# Patient Record
Sex: Male | Born: 1950
Health system: Southern US, Community
[De-identification: ages and names within clinical notes are randomized; demographics above are authoritative.]

## PROBLEM LIST (undated history)

## (undated) DIAGNOSIS — G459 Transient cerebral ischemic attack, unspecified: Secondary | ICD-10-CM

## (undated) DIAGNOSIS — N2889 Other specified disorders of kidney and ureter: Secondary | ICD-10-CM

## (undated) DIAGNOSIS — A0471 Enterocolitis due to Clostridium difficile, recurrent: Secondary | ICD-10-CM

## (undated) DIAGNOSIS — A0472 Enterocolitis due to Clostridium difficile, not specified as recurrent: Secondary | ICD-10-CM

## (undated) DIAGNOSIS — J449 Chronic obstructive pulmonary disease, unspecified: Secondary | ICD-10-CM

## (undated) DIAGNOSIS — I2510 Atherosclerotic heart disease of native coronary artery without angina pectoris: Secondary | ICD-10-CM

## (undated) DIAGNOSIS — I639 Cerebral infarction, unspecified: Secondary | ICD-10-CM

## (undated) DIAGNOSIS — I219 Acute myocardial infarction, unspecified: Secondary | ICD-10-CM

## (undated) DIAGNOSIS — R0989 Other specified symptoms and signs involving the circulatory and respiratory systems: Secondary | ICD-10-CM

## (undated) DIAGNOSIS — R338 Other retention of urine: Secondary | ICD-10-CM

## (undated) HISTORY — DX: Other specified symptoms and signs involving the circulatory and respiratory systems: R09.89

## (undated) HISTORY — PX: BACK SURGERY: SHX140

## (undated) HISTORY — PX: KNEE SURGERY: SHX244

---

## 1995-04-05 DIAGNOSIS — I639 Cerebral infarction, unspecified: Secondary | ICD-10-CM

## 1995-04-05 HISTORY — DX: Cerebral infarction, unspecified: I63.9

## 1998-08-20 ENCOUNTER — Encounter: Payer: Self-pay | Admitting: Neurosurgery

## 1998-08-24 ENCOUNTER — Encounter: Payer: Self-pay | Admitting: Neurosurgery

## 1998-08-24 ENCOUNTER — Ambulatory Visit (HOSPITAL_COMMUNITY): Admission: RE | Admit: 1998-08-24 | Discharge: 1998-08-24 | Payer: Self-pay | Admitting: Neurosurgery

## 1998-09-24 ENCOUNTER — Encounter: Admission: RE | Admit: 1998-09-24 | Discharge: 1998-10-09 | Payer: Self-pay | Admitting: Neurosurgery

## 1999-03-09 ENCOUNTER — Emergency Department (HOSPITAL_COMMUNITY): Admission: EM | Admit: 1999-03-09 | Discharge: 1999-03-09 | Payer: Self-pay | Admitting: Emergency Medicine

## 1999-03-09 ENCOUNTER — Encounter: Payer: Self-pay | Admitting: Emergency Medicine

## 1999-03-11 ENCOUNTER — Encounter: Payer: Self-pay | Admitting: Neurology

## 1999-03-11 ENCOUNTER — Ambulatory Visit (HOSPITAL_COMMUNITY): Admission: RE | Admit: 1999-03-11 | Discharge: 1999-03-11 | Payer: Self-pay | Admitting: Neurology

## 2005-07-08 ENCOUNTER — Emergency Department (HOSPITAL_COMMUNITY): Admission: EM | Admit: 2005-07-08 | Discharge: 2005-07-08 | Payer: Self-pay | Admitting: Emergency Medicine

## 2007-10-31 ENCOUNTER — Ambulatory Visit: Payer: Self-pay | Admitting: Cardiology

## 2007-11-05 ENCOUNTER — Ambulatory Visit: Payer: Self-pay

## 2010-08-17 NOTE — Assessment & Plan Note (Signed)
[facility]                            CARDIOLOGY OFFICE NOTE   Walter [name], Walter [name]                         MRN:          [id number]  DATE:10/31/2007                            DOB:          [birth year]    PRIMARY CARE PHYSICIAN:  Lorin Picket Long, PA   REASON FOR PRESENTATION:  Evaluate the patient with chest pain.   HISTORY OF PRESENT ILLNESS:  The patient is 60 year old.  He has no  documented coronary disease, although he had a CVA in 1997.  He reports  having had a catheterization 20 years ago.  He also described some  stress test.  He do not have any reports of these.  He saw Lorin Picket Long in  mid June and was describing some chest discomfort.  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  He described some substernal chest pressure.  It was not like  his previous reflux.  It was 6/10 in intensity.  He was short of breath  with it.  Did not radiate to his neck.  His left arm did tingling and  numb.  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  He got back to [facility] and called to  see Mr. Jacqulyn Bath.  In the office, he did not apparently have any acute EKG  changes.  He was given an aspirin and his symptoms on away.  They last  for about an hour and a half.  They was requested that he consent to  being admitted to the hospital, but the patient refused leaving the  office against medical advice.  He has had no further symptoms since  that time.  He is limited in his activities by hip pain.  He has not  pushed himself.  He has not been driving.  He has not been able to bring  on any of the symptoms that are described above.  He does not describe  any resting, shortness of breath, and has no PND or orthopnea.  He will  get dyspneic with some activity.  He has been a longtime cigarette  smoker and continues to do this.  He has some episodes of dizziness.  However, he says this has been unchanged since 1997.  He denies any  syncope.   PAST MEDICAL HISTORY:  1. Peptic ulcer disease.  2.  Hypertension.  3. Previous cerebrovascular accident and apparent TIAs as well.  4. Chronic low back pain.  5. Apparent COPD.   PAST SURGICAL HISTORY:  Back surgery and knee surgery.   ALLERGIES:  None.   MEDICATIONS:  1. Lisinopril HCT 20/25 daily.  2. Aspirin 325 mg.  3. Fish oil.   SOCIAL HISTORY:  The patient has been a one-pack-per-day smoker for 40  years.  He does drink alcohol.  He is a Naval architect.  He is married.  He has 2 children.   Family history is contributory for brother with early onset heart  disease.   REVIEW OF SYSTEMS:  As stated in the HPI.  Positive for dentures,  reflux, mild lower extremity swelling.  Negative for all other systems.  PHYSICAL EXAMINATION:  GENERAL:  The patient is in no distress.  VITAL SIGNS:  Blood pressure 132/70, heart rate 60 and regular, and  weight 190 pounds.  HEENT:  Eyes are unremarkable.  Pupils are equal, round, and reactive to  light.  Fundi not visualized.  Oral mucosa unremarkable.  NECK:  No jugular venous distention, 45 degrees.  Carotid upstroke brisk  and symmetrical.  No bruits.  No thyromegaly.  LYMPHATICS:  No cervical, axillary, or inguinal adenopathy.  LUNGS:  Clear to auscultation bilaterally.  BACK:  No costovertebral angle tenderness.  CHEST:  Unremarkable.  HEART:  PMI not displaced or sustained, S1 and S2 within normal limits,  no S3, no S4, no clicks, no rubs, no murmurs.  ABDOMEN:  Mildly obese, positive bowel sounds, normal in frequency and  pitch, no bruits, no rebound, no guarding or midline pulsatile mass.  No  hepatomegaly.  No splenomegaly.  SKIN:  No rashes, no nodules.  EXTREMITIES:  2+ pulses throughout, no edema, no cyanosis, no clubbing.  NEURO:  Oriented to person, place, time.  Cranial nerves II through XII  are grossly intact, motor grossly intact.   EKG:  Sinus rhythm, rate 64, axis within normal limits, intervals within  normal limits, no acute ST-T wave changes.   ASSESSMENT AND  PLAN:  1. Chest pain.  The patient's chest pain was very worrisome for an      episode of unstable angina.  He has significant cardiovascular risk      factors.  He refused hospital admission that day.  However, I do      think he will consent to an exercise Cardiolite.  He refuses to      have an adenosine Cardiolite if it came to that.  Therefore, this      would need to be an exercise perfusion study.  2. Hypertension.  Blood pressure is controlled.  He will continue the      medication as listed.  3. Tobacco.  I talked to him about Chantix (greater than 3 minutes).      He said he would not able to afford this.  He cannot otherwise      quit.  He has tried the other medications.  4. Dyslipidemia.  He has not been compliant with suggestions for      medications in the past.  He does have a low HDL.  If he would      consent, I would suggest generic fibric acid or Niaspan.  5. Followup.  We will see the patient back based on the results of the      above study.     Rollene Rotunda, MD, [facility]  Electronically Signed   JH/MedQ  DD: 10/31/2007  DT: 11/01/2007  Job #: [id number]   cc:   Lindaann Pascal, PA

## 2010-08-20 NOTE — Consult Note (Signed)
[place]  Patient:    Walter [name]                          MRN: [id number] Proc. Date: 03/09/99 Adm. Date:  [id number] Attending:  Lorre Nick                          Consultation Report  CHIEF COMPLAINT:  Unable to speak.  HISTORY OF PRESENT ILLNESS:  The patient is a 60 year old man who was brought by EMS from Dr. Garnette Gunner office in [place] for evaluation of inability to speak. Reportedly,  the patients wife had talked on the phone with the patient earlier  this morning, and she noticed he could not talk as normal.  The patients wife went to pick him up and take him to his doctors office, and subsequently the patient  was referred by Dr. Dewaine Conger to be sent to the emergency room by EMS.  The patients wife relates the patient had previous episodes of infarction characterized by inability to speak and weakness on the right side of his body.  He has been evaluated in the past by neurologist from [facility], including Dr. Noreene Filbert, Dr. Orlin Hilding, and Dr. Lesia Sago.  Previous evaluation with MRI in 1997 has shown evidence of left seventh semiovale subcortical infarction.  He had a repeat MRI and MRA in 1998 which have shown evidence of chronic infarctions, unremarkable MRA and unremarkable coagulation studies.  The patient currently is being treated with coated aspirin 81 mg once a day for secondary stroke prevention.  PAST MEDICAL HISTORY: 1. Strokes and TIAs. 2. Right lumbosacral radiculopathy, status post laminectomy. 3. Heart murmur.  MEDICATIONS:  Aspirin 81 mg once a day.  SOCIAL HISTORY:  The patient is a Naval architect.  He lives with his wife who states that recently he has been under a lot of stress at his work.  He smokes about one pack per day, denies alcohol drinking.  FAMILY HISTORY:  Mother had heart disease and hypertension.  Father deceased of  cancer.  Primary care physician is Dr. Colon Flattery from [place].  REVIEW OF SYSTEMS:  As per History of Present Illness.  PHYSICAL EXAMINATION:  VITAL SIGNS:  Blood pressure 130/79, pulse 64, respirations 16, temperature 98.4. Oxygen saturation 99%.  HEENT:  Head is normocephalic, atraumatic.  NECK:  Supple, no bruits.  LUNGS:  Clear bilaterally.  HEART:  Heart sounds regular rhythm, no murmurs.  ABDOMEN:  Soft, bowel sounds present.  No visceromegaly.  EXTREMITIES:  No cyanosis or edema.  NEUROLOGIC:   A well-developed man laying on the stretcher in no acute distress. He is awake and alert, able to follow commands.  He shows difficulties with speaking but is clearly functional. Pupils are equal, round and reactive. Extra-oculocephalic movement present.  Face is symmetric.  Tongue is midline. Palate elevates symmetrically.  Motor examination displays a right hemiparesis which is functional with positive Hoovers sign.  Reflexes symmetric throughout.  Plantars downgoing bilaterally.  Sensory examination intact to touch and pinprick.  NEURAL IMAGING:  I have personally reviewed a CT scan of the patients brain which shows two previous lacunar infarctions.  No acute ischemic changes, no hemorrhage, no hydrocephalus.  IMPRESSION:   Aphasia, weakness, right hemiparesis, functional.  PLAN AND RECOMMENDATIONS:  The diagnosis, condition, and further intervention were discussed at length with the patient and his wife at the bedside.  This patient  does not want any further neurological investigation at this time.  Will advise in regards to cerebrovascular disease to continue on his aspirin for stroke prevention.  Further psychological counselling versus psychiatric consultation should be pursued at this time.  Thank you for allowing me to participate in the care of this patient. DD:  03/09/99 TD:  03/09/99 Job: [id number]

## 2010-08-20 NOTE — Letter (Signed)
November 16, 2007    Tamala Bari  [facility] [employer]  [address] [place], [place] [address]   RE:  YAVUZ, KIRBY  MRN:  [id number]  /  DOB:  1950/07/09   Dear Ms. Roseanne Reno:   This letter regards Lonell Grandchild.  Mr. Stiefel was sent to my Cardiology  Clinic for evaluation of chest discomfort.  He had no prior cardiac  history.  I sent him for an exercise perfusion stress test to rule out  ischemic coronary artery disease.  He had a very poor exercise  tolerance, but achieved the target heart rate.  There was an ejection  fraction of 61%.  There was no evidence of ischemic heart disease or  high-grade obstructive coronary artery disease.   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  If you have further  questions, please contact my office at [phone].    Sincerely,      Rollene Rotunda, MD, [facility]  Electronically Signed    JH/MedQ  DD: 11/16/2007  DT: 11/17/2007  Job #: [id number]

## 2011-06-06 ENCOUNTER — Other Ambulatory Visit: Payer: Self-pay | Admitting: Family Medicine

## 2011-06-06 DIAGNOSIS — R0989 Other specified symptoms and signs involving the circulatory and respiratory systems: Secondary | ICD-10-CM

## 2011-06-09 ENCOUNTER — Ambulatory Visit (HOSPITAL_COMMUNITY)
Admission: RE | Admit: 2011-06-09 | Discharge: 2011-06-09 | Disposition: A | Discharge status: Home / Self Care | Payer: Medicare Other | Source: Ambulatory Visit | Attending: Family Medicine | Admitting: Family Medicine

## 2011-06-09 DIAGNOSIS — R0989 Other specified symptoms and signs involving the circulatory and respiratory systems: Secondary | ICD-10-CM | POA: Insufficient documentation

## 2012-02-17 ENCOUNTER — Emergency Department (HOSPITAL_COMMUNITY): Payer: Medicare Other

## 2012-02-17 ENCOUNTER — Inpatient Hospital Stay (HOSPITAL_COMMUNITY)
Admission: EM | Admit: 2012-02-17 | Discharge: 2012-02-19 | DRG: 066 | Disposition: A | Discharge status: Home / Self Care | Payer: Medicare Other | Attending: Family Medicine | Admitting: Family Medicine

## 2012-02-17 ENCOUNTER — Encounter (HOSPITAL_COMMUNITY): Payer: Self-pay | Admitting: *Deleted

## 2012-02-17 DIAGNOSIS — Z8249 Family history of ischemic heart disease and other diseases of the circulatory system: Secondary | ICD-10-CM

## 2012-02-17 DIAGNOSIS — E785 Hyperlipidemia, unspecified: Secondary | ICD-10-CM | POA: Diagnosis present

## 2012-02-17 DIAGNOSIS — I252 Old myocardial infarction: Secondary | ICD-10-CM

## 2012-02-17 DIAGNOSIS — Z23 Encounter for immunization: Secondary | ICD-10-CM

## 2012-02-17 DIAGNOSIS — R29898 Other symptoms and signs involving the musculoskeletal system: Secondary | ICD-10-CM | POA: Diagnosis present

## 2012-02-17 DIAGNOSIS — Z823 Family history of stroke: Secondary | ICD-10-CM

## 2012-02-17 DIAGNOSIS — Z91199 Patient's noncompliance with other medical treatment and regimen due to unspecified reason: Secondary | ICD-10-CM

## 2012-02-17 DIAGNOSIS — J449 Chronic obstructive pulmonary disease, unspecified: Secondary | ICD-10-CM | POA: Diagnosis present

## 2012-02-17 DIAGNOSIS — I2510 Atherosclerotic heart disease of native coronary artery without angina pectoris: Secondary | ICD-10-CM | POA: Diagnosis present

## 2012-02-17 DIAGNOSIS — J4489 Other specified chronic obstructive pulmonary disease: Secondary | ICD-10-CM | POA: Diagnosis present

## 2012-02-17 DIAGNOSIS — I69998 Other sequelae following unspecified cerebrovascular disease: Secondary | ICD-10-CM

## 2012-02-17 DIAGNOSIS — Z7982 Long term (current) use of aspirin: Secondary | ICD-10-CM

## 2012-02-17 DIAGNOSIS — I6350 Cerebral infarction due to unspecified occlusion or stenosis of unspecified cerebral artery: Principal | ICD-10-CM | POA: Diagnosis present

## 2012-02-17 DIAGNOSIS — Z79899 Other long term (current) drug therapy: Secondary | ICD-10-CM

## 2012-02-17 DIAGNOSIS — Z720 Tobacco use: Secondary | ICD-10-CM | POA: Diagnosis present

## 2012-02-17 DIAGNOSIS — F17200 Nicotine dependence, unspecified, uncomplicated: Secondary | ICD-10-CM | POA: Diagnosis present

## 2012-02-17 DIAGNOSIS — I639 Cerebral infarction, unspecified: Secondary | ICD-10-CM

## 2012-02-17 DIAGNOSIS — Z8673 Personal history of transient ischemic attack (TIA), and cerebral infarction without residual deficits: Secondary | ICD-10-CM | POA: Diagnosis present

## 2012-02-17 DIAGNOSIS — Z9119 Patient's noncompliance with other medical treatment and regimen: Secondary | ICD-10-CM

## 2012-02-17 HISTORY — DX: Atherosclerotic heart disease of native coronary artery without angina pectoris: I25.10

## 2012-02-17 HISTORY — DX: Acute myocardial infarction, unspecified: I21.9

## 2012-02-17 HISTORY — DX: Cerebral infarction, unspecified: I63.9

## 2012-02-17 HISTORY — DX: Transient cerebral ischemic attack, unspecified: G45.9

## 2012-02-17 HISTORY — DX: Chronic obstructive pulmonary disease, unspecified: J44.9

## 2012-02-17 LAB — PROTIME-INR: Prothrombin Time: 12.8 seconds (ref 11.6–15.2)

## 2012-02-17 LAB — URINALYSIS, ROUTINE W REFLEX MICROSCOPIC
Leukocytes, UA: NEGATIVE
Nitrite: NEGATIVE
Protein, ur: NEGATIVE mg/dL
Urobilinogen, UA: 0.2 mg/dL (ref 0.0–1.0)

## 2012-02-17 LAB — CBC WITH DIFFERENTIAL/PLATELET
Basophils Absolute: 0 10*3/uL (ref 0.0–0.1)
Eosinophils Relative: 3 % (ref 0–5)
Lymphocytes Relative: 37 % (ref 12–46)
MCV: 88.3 fL (ref 78.0–100.0)
Platelets: 238 10*3/uL (ref 150–400)
RDW: 13.6 % (ref 11.5–15.5)
WBC: 9.2 10*3/uL (ref 4.0–10.5)

## 2012-02-17 LAB — BASIC METABOLIC PANEL
CO2: 23 mEq/L (ref 19–32)
Calcium: 9.4 mg/dL (ref 8.4–10.5)
GFR calc non Af Amer: >90 mL/min (ref >90)
Sodium: 136 mEq/L (ref 135–145)

## 2012-02-17 NOTE — ED Notes (Signed)
Dizziness,slurred speech, headache, Hx of stroke.  Onset yesterday

## 2012-02-17 NOTE — ED Provider Notes (Signed)
History     CSN: [id number]  Arrival date & time 02/17/12  2018   First MD Initiated Contact with Patient 02/17/12 2032      No chief complaint on file.   (Consider location/radiation/quality/duration/timing/severity/associated sxs/prior treatment) HPI Comments: REINHARD SCHACK presents with a now 2 day history of intermittent episodes of dizziness which he describes as a spinning sensation which is triggered by standing,  Describing having 2 distinct episodes lasting about 5 minutes yesterday and another today.  His wife noticed him to have slurred speech since he woke this am. He describes a mild generalized headache and also feels numb in his left face and arm.  He does have a history of cva in 1998 which also involved slurred speech but did not leave him with any residual symptoms.  He has taken an aspirin 325 mg prior to arrival.  He is supposed to be on medications for blood pressure per wife, but has been noncompliant.  The history is provided by the patient and the spouse.    Past Medical History  Diagnosis Date  . Stroke 1997  . COPD (chronic obstructive pulmonary disease)   . Coronary artery disease   . Myocardial infarction     incidental  . Brain TIA     recurrent     Past Surgical History  Procedure Date  . Back surgery   . Knee surgery     Family History  Problem Relation Age of Onset  . Cancer Father 38    stomach  . Stroke Neg Hx   . Coronary artery disease Mother 50  . Coronary artery disease Brother 37    AMI deceased    History  Substance Use Topics  . Smoking status: Current Every Day Smoker -- 1.3 packs/day for 17 years  . Smokeless tobacco: Current User    Types: Chew  . Alcohol Use: No      Review of Systems  Constitutional: Negative for fever.  HENT: Negative for congestion, sore throat and neck pain.   Eyes: Negative.   Respiratory: Negative for chest tightness and shortness of breath.   Cardiovascular: Negative for chest pain.    Gastrointestinal: Negative for nausea and abdominal pain.  Genitourinary: Negative.   Musculoskeletal: Negative for joint swelling and arthralgias.  Skin: Negative.  Negative for rash and wound.  Neurological: Positive for dizziness, speech difficulty, numbness and headaches. Negative for weakness and light-headedness.  Hematological: Negative.   Psychiatric/Behavioral: Negative.     Allergies  Review of patient's allergies indicates no known allergies.  Home Medications   Current Outpatient Rx  Name  Route  Sig  Dispense  Refill  . ASPIRIN 325 MG PO TABS   Oral   Take 325 mg by mouth once as needed. For pain           BP 166/86  Pulse 82  Temp 97.7 F (36.5 C) (Oral)  Resp 14  Ht 6\' 1"  (1.854 m)  Wt 180 lb (81.647 kg)  BMI 23.75 kg/m2  SpO2 95%  Physical Exam  Nursing note and vitals reviewed. Constitutional: He is oriented to person, place, and time. He appears well-developed and well-nourished.       Uncomfortable appearing  HENT:  Head: Normocephalic and atraumatic.  Right Ear: External ear normal.  Left Ear: External ear normal.  Mouth/Throat: Oropharynx is clear and moist.  Eyes: EOM are normal. Pupils are equal, round, and reactive to light.  Neck: Normal range of motion. Neck supple.  Cardiovascular: Normal rate and normal heart sounds.   Pulmonary/Chest: Effort normal.  Abdominal: Soft. There is no tenderness.  Musculoskeletal: Normal range of motion.  Lymphadenopathy:    He has no cervical adenopathy.  Neurological: He is alert and oriented to person, place, and time. A cranial nerve deficit and sensory deficit is present. Gait normal. GCS eye subscore is 4. GCS verbal subscore is 5. GCS motor subscore is 6.       Numbness to light touch left face including forehead.  4/5 left grip, 5/5 right grip strength.  Pt became dizzy with attempts to sit and attempt pronator drift.  Difficulty with heel/shin test with left leg.  Slight dysarthria noted.  Skin:  Skin is warm and dry. No rash noted.  Psychiatric: He has a normal mood and affect. His speech is normal and behavior is normal. Thought content normal. Cognition and memory are normal.    ED Course  Procedures (including critical care time)  Labs Reviewed  BASIC METABOLIC PANEL - Abnormal; Notable for the following:    Glucose, Bld 100 (*)     All other components within normal limits  CBC WITH DIFFERENTIAL  URINALYSIS, ROUTINE W REFLEX MICROSCOPIC  PROTIME-INR   Ct Head Wo Contrast  02/17/2012  *RADIOLOGY REPORT*  Clinical Data: Slurred speech, weakness, dizziness.  CT HEAD WITHOUT CONTRAST  Technique:  Contiguous axial images were obtained from the base of the skull through the vertex without contrast.  Comparison: 11/19/2009  Findings: Mild prominence of the sulci, cisterns, and ventricles, in keeping with volume loss. There are subcortical and periventricular white matter hypodensities, a nonspecific finding most often seen with chronic microangiopathic changes.  There is no evidence for acute hemorrhage, overt hydrocephalus, mass lesion, or abnormal extra-axial fluid collection.  No definite CT evidence for acute cortical based (large artery) infarction. Multiple bilateral basal ganglia lacunar infarctions are favored remote however, progressed from 2011.  Mild ethmoid air cell opacification.  Otherwise, the visualized paranasal sinuses and mastoid air cells are predominately clear.  IMPRESSION: White matter changes and bilateral remote appearing basal ganglia lacunar infarctions, progressed from 2011.  Given the clinical presentation and evidence of multiple prior infarcts, MRI follow-up is reasonable if concern for acute ischemia persists.  Mild ethmoid air cell opacification.  Correlate clinically for early/mild sinusitis.   Original Report Authenticated By: Jearld Lesch, M.D.      1. CVA (cerebral infarction)       MDM  Reviewed labs and Ct scan with patient.  Discussed with  Dr Estell Harpin who agreed with need for admission.  Spoke with Dr. Orvan Falconer who will see pt in ed.        Burgess Amor, PA 02/18/12 0011  Burgess Amor, PA 02/18/12 1610

## 2012-02-17 NOTE — ED Notes (Signed)
EDPA in with pt 

## 2012-02-18 ENCOUNTER — Inpatient Hospital Stay (HOSPITAL_COMMUNITY): Payer: Medicare Other

## 2012-02-18 ENCOUNTER — Encounter (HOSPITAL_COMMUNITY): Payer: Self-pay | Admitting: Internal Medicine

## 2012-02-18 DIAGNOSIS — F17200 Nicotine dependence, unspecified, uncomplicated: Secondary | ICD-10-CM

## 2012-02-18 DIAGNOSIS — I517 Cardiomegaly: Secondary | ICD-10-CM

## 2012-02-18 DIAGNOSIS — Z9119 Patient's noncompliance with other medical treatment and regimen: Secondary | ICD-10-CM

## 2012-02-18 DIAGNOSIS — Z91199 Patient's noncompliance with other medical treatment and regimen due to unspecified reason: Secondary | ICD-10-CM

## 2012-02-18 DIAGNOSIS — Z720 Tobacco use: Secondary | ICD-10-CM | POA: Diagnosis present

## 2012-02-18 DIAGNOSIS — I6350 Cerebral infarction due to unspecified occlusion or stenosis of unspecified cerebral artery: Principal | ICD-10-CM

## 2012-02-18 DIAGNOSIS — Z8673 Personal history of transient ischemic attack (TIA), and cerebral infarction without residual deficits: Secondary | ICD-10-CM | POA: Diagnosis present

## 2012-02-18 DIAGNOSIS — J449 Chronic obstructive pulmonary disease, unspecified: Secondary | ICD-10-CM

## 2012-02-18 LAB — CBC
MCH: 29.6 pg (ref 26.0–34.0)
Platelets: 207 10*3/uL (ref 150–400)
RBC: 5.17 MIL/uL (ref 4.22–5.81)
RDW: 13.6 % (ref 11.5–15.5)
WBC: 7.7 10*3/uL (ref 4.0–10.5)

## 2012-02-18 LAB — CREATININE, SERUM: Creatinine, Ser: 0.79 mg/dL (ref 0.50–1.35)

## 2012-02-18 LAB — LIPID PANEL
Cholesterol: 204 mg/dL — ABNORMAL HIGH (ref 0–200)
HDL: 27 mg/dL — ABNORMAL LOW (ref >39)
Total CHOL/HDL Ratio: 7.6 RATIO
VLDL: 29 mg/dL (ref 0–40)

## 2012-02-18 MED ORDER — ONDANSETRON HCL 4 MG/2ML IJ SOLN: 4.0000 mg | Freq: Four times a day (QID) | INTRAMUSCULAR | Status: DC | PRN

## 2012-02-18 MED ORDER — ASPIRIN 300 MG RE SUPP
300.0000 mg | Freq: Every day | RECTAL | Status: DC
  Filled 2012-02-18 (×2): qty 1

## 2012-02-18 MED ORDER — ASPIRIN 325 MG PO TABS
325.0000 mg | ORAL_TABLET | Freq: Every day | ORAL | Status: DC
  Administered 2012-02-18 – 2012-02-19 (×2): 325 mg via ORAL
  Filled 2012-02-18 (×2): qty 1

## 2012-02-18 MED ORDER — SENNOSIDES-DOCUSATE SODIUM 8.6-50 MG PO TABS: 1.0000 | ORAL_TABLET | Freq: Every evening | ORAL | Status: DC | PRN

## 2012-02-18 MED ORDER — FLEET ENEMA 7-19 GM/118ML RE ENEM: 1.0000 | ENEMA | Freq: Every day | RECTAL | Status: DC | PRN

## 2012-02-18 MED ORDER — SODIUM CHLORIDE 0.9 % IV SOLN
INTRAVENOUS | Status: DC
  Administered 2012-02-18 (×2): via INTRAVENOUS

## 2012-02-18 MED ORDER — BISACODYL 5 MG PO TBEC: 5.0000 mg | DELAYED_RELEASE_TABLET | Freq: Every day | ORAL | Status: DC | PRN

## 2012-02-18 MED ORDER — SIMVASTATIN 20 MG PO TABS
20.0000 mg | ORAL_TABLET | Freq: Every day | ORAL | Status: DC
  Administered 2012-02-18 (×2): 20 mg via ORAL
  Filled 2012-02-18 (×4): qty 1

## 2012-02-18 MED ORDER — ENOXAPARIN SODIUM 40 MG/0.4ML ~~LOC~~ SOLN
40.0000 mg | SUBCUTANEOUS | Status: DC
  Administered 2012-02-18 – 2012-02-19 (×2): 40 mg via SUBCUTANEOUS
  Filled 2012-02-18 (×2): qty 0.4

## 2012-02-18 MED ORDER — ACETAMINOPHEN 325 MG PO TABS: 650.0000 mg | ORAL_TABLET | ORAL | Status: DC | PRN

## 2012-02-18 MED ORDER — NICOTINE 21 MG/24HR TD PT24: 21.0000 mg | MEDICATED_PATCH | Freq: Every day | TRANSDERMAL | Status: DC | PRN

## 2012-02-18 MED ORDER — PNEUMOCOCCAL VAC POLYVALENT 25 MCG/0.5ML IJ INJ
0.5000 mL | INJECTION | INTRAMUSCULAR | Status: AC
  Filled 2012-02-18: qty 0.5

## 2012-02-18 NOTE — Progress Notes (Signed)
*  PRELIMINARY RESULTS* Echocardiogram 2D Echocardiogram has been performed.  Walter [name] 02/18/2012, 3:57 PM

## 2012-02-18 NOTE — Progress Notes (Signed)
11:07 AM I agree with HPI/GPe and A/P per Dr. Orvan Falconer       Patient states he had 2 episodes of dizziness, one on 11/14 at night, the other 11/15 at 13:00.  Wife reports was unsteady getting out of the car and was noted to have some slurred speech by his wife at that time   HEENT eomi, no pallor/ict CHEST clear, no added sound.  NO Tvr/f CARDIAC s1 s2 no m/r/g ABDOMEN soft nt/nd Neurologic: Mental status: Alert, oriented, thought content appropriate, alertness: alert, orientation: time, date, person, affect: normal, thought content exhibits logical connections Cranial nerves: II: visual acuity normal bilaterally, II: pupils equal, round, reactive to light and accommodation, V: mastication normal, VII: upper facial muscle function normal bilaterally, VII: lower facial muscle function normal bilaterally, VIII: hearing normal, XI: trapezius strength normal bilaterally, XI: sternocleidomastoid strength normal bilaterally, XI: neck flexion strength normal, XII: tongue strength normal  Sensory: temperature sense present generalized bilaterally, proprioceptive sense present generalized bilaterally Motor: grossly normal Reflexes: 2+ and symmetric Coordination: normal Gait: Normal SKIN/MUSCULAR   Patient Active Problem List  Diagnosis  . CVA (cerebral infarction)  . Tobacco abuse  . Non-compliance  . COPD (chronic obstructive pulmonary disease)   A/p  Agree with Dr. Blair Dolphin plan Add Statin given LDL 148 Follow A1c given slightly elevated CBG's Allow permissive HTN for 24 hours Cont ASa for now  Pleas Koch, MD [facility] [phone]  Dr. Roseanne Reno of [facility] made aware of patient-will see when Imaging and work-up completed

## 2012-02-18 NOTE — Progress Notes (Signed)
[facility] follow-up progress note (same day)  MRI shows acute CVA--Acute small non hemorrhagic infarct extends from the mid  left corona radiata into the posterior limb of the left internal  capsule.  Remote infarcts involving the corona radiata and basal ganglia  bilaterally, right cerebellum, right paracentral pons and right  thalamus.   Schedule patient and wife-neurology to followup. Patient will need physical therapy occupational therapy to see him for clearance home.  Pleas Koch, MD [facility] [phone]

## 2012-02-18 NOTE — Progress Notes (Signed)
*  PRELIMINARY RESULTS* Vascular Ultrasound Carotid Duplex (Doppler) has been completed.  Preliminary findings: Bilateral:  No evidence of hemodynamically significant internal carotid artery stenosis.   Vertebral artery flow is antegrade.      Farrel Demark, RDMS, RVT 02/18/2012, 11:25 AM

## 2012-02-18 NOTE — Progress Notes (Signed)
PT Cancellation Note  Patient Details Name: Walter [name] MRN: [id number] DOB: September 21, 1950   Cancelled Treatment:    Reason Eval/Treat Not Completed: Patient not medically ready.  PT order received - to start 02/19/12 (tomorrow).  Will return tomorrow for PT evaluation.   Vena Austria 02/18/2012, 2:24 PM [phone]

## 2012-02-18 NOTE — ED Notes (Signed)
MD at bedside. 

## 2012-02-18 NOTE — Consult Note (Signed)
Referring Physician: Dr. Mahala Menghini    Chief Complaint: Dizziness and slurred speech.  HPI: Walter [name] is an 61 y.o. male a history of previous stroke and multiple TIAs, coronary artery disease, hyperlipidemia and COPD, who was brought to the hospital following onset of episode of dizziness as well as onset of slurred speech. His wife also noted his walking had changed. CT scan of his head was unremarkable except for old small vessel ischemic changes. MRI of his brain today showed acute small non-hemorrhagic infarction extending from the mid left corona radiata to the posterior limb of left internal capsule. Patient has not been on antiplatelet therapy. NIH stroke score at this point is 2. He was last seen normal at about 1:30 yesterday afternoon.  LSN: 1:30 PM on 02/17/2012 tPA Given: No: Mild deficits only. MRankin: 0   Past Medical History  Diagnosis Date  . Stroke 1997  . COPD (chronic obstructive pulmonary disease)   . Coronary artery disease   . Myocardial infarction     incidental  . Brain TIA     recurrent     Family History  Problem Relation Age of Onset  . Cancer Father 89    stomach  . Stroke Neg Hx   . Coronary artery disease Mother 35  . Coronary artery disease Brother 47    AMI deceased     Medications:  Prior to Admission:  Prescriptions prior to admission  Medication Sig Dispense Refill  . aspirin 325 MG tablet Take 325 mg by mouth once as needed. For pain       Scheduled:   . aspirin  300 mg Rectal Daily   Or  . aspirin  325 mg Oral Daily  . enoxaparin  40 mg Subcutaneous Q24H  . pneumococcal 23 valent vaccine  0.5 mL Intramuscular Tomorrow-1000  . simvastatin  20 mg Oral q1800   ZOX:[id number], bisacodyl, nicotine, ondansetron (ZOFRAN) IV, senna-docusate, sodium phosphate   Physical Examination: Blood pressure 132/85, pulse 83, temperature 98.2 F (36.8 C), temperature source Oral, resp. rate 18, height 6\' 1"  (1.854 m), weight 72.7 kg (160 lb  4.4 oz), SpO2 99.00%.  Neurologic Examination: Mental Status: Alert, oriented, thought content appropriate.  Speech was slow, but fluent without evidence of aphasia. Able to follow commands without difficulty. Cranial Nerves: II-Visual fields were normal. III/IV/VI-Pupils were equal and reacted. Extraocular movements were full and conjugate.    V/VII-no facial numbness and no facial weakness. VIII-normal. X-normal speech and symmetrical palatal movement. XII-midline tongue extension Motor: Slight left upper extremity pronator drift and mild left hip flexor weakness; motor exam is otherwise normal. Sensory: Normal throughout. Deep Tendon Reflexes: 2+ and symmetric. Plantars: Mute bilaterally Cerebellar: Normal finger-to-nose testing. Carotid auscultation: Normal   Ct Head Wo Contrast  02/17/2012  *RADIOLOGY REPORT*  Clinical Data: Slurred speech, weakness, dizziness.  CT HEAD WITHOUT CONTRAST  Technique:  Contiguous axial images were obtained from the base of the skull through the vertex without contrast.  Comparison: 11/19/2009  Findings: Mild prominence of the sulci, cisterns, and ventricles, in keeping with volume loss. There are subcortical and periventricular white matter hypodensities, a nonspecific finding most often seen with chronic microangiopathic changes.  There is no evidence for acute hemorrhage, overt hydrocephalus, mass lesion, or abnormal extra-axial fluid collection.  No definite CT evidence for acute cortical based (large artery) infarction. Multiple bilateral basal ganglia lacunar infarctions are favored remote however, progressed from 2011.  Mild ethmoid air cell opacification.  Otherwise, the visualized paranasal sinuses and  mastoid air cells are predominately clear.  IMPRESSION: White matter changes and bilateral remote appearing basal ganglia lacunar infarctions, progressed from 2011.  Given the clinical presentation and evidence of multiple prior infarcts, MRI follow-up  is reasonable if concern for acute ischemia persists.  Mild ethmoid air cell opacification.  Correlate clinically for early/mild sinusitis.   Original Report Authenticated By: Jearld Lesch, M.D.    Mr Brain Wo Contrast  02/18/2012  *RADIOLOGY REPORT*  Clinical Data:  Slurred speech.  Weakness.  MRI BRAIN WITHOUT CONTRAST MRA HEAD WITHOUT CONTRAST  Technique: Multiplanar, multiecho pulse sequences of the brain and surrounding structures were obtained according to standard protocol without intravenous contrast.  Angiographic images of the head were obtained using MRA technique without contrast.  Comparison: 02/17/2012 CT.  No comparison MR.  MRI HEAD  Findings:  Acute small non hemorrhagic infarct extends from the mid left corona radiata into the posterior limb of the left internal capsule.  Remote infarcts involving the corona radiata and basal ganglia bilaterally, right cerebellum, right paracentral pons and right thalamus.  Marked small vessel disease type changes.  Minimal amount of blood breakdown products associated with remote left caudate infarct otherwise no evidence of intracranial hemorrhage.  No intracranial mass lesion detected on this unenhanced exam.  Global atrophy without hydrocephalus.  Mild exophthalmos.  IMPRESSION: Acute small non hemorrhagic infarct extends from the mid left corona radiata into the posterior limb of the left internal capsule.  Remote infarcts and small vessel disease type changes with atrophy as detailed above  MRA HEAD  Findings: Anterior circulation without large vessel significant stenosis or occlusion.  Mild to moderate narrowing distal A1 segment of the left anterior cerebral artery.  Middle cerebral artery branch vessel irregularity bilaterally.  Left vertebral artery is dominant.  Moderate narrowing of the right vertebral artery after the takeoff of the right PICA.  Mild narrowing of the left vertebral artery after the takeoff of the left PICA.  Mild irregularity  and slight narrowing involving portions of the basilar artery without high-grade stenosis.  Nonvisualization AICAs.  Mild irregularity of the superior cerebellar arteries and posterior cerebral arteries bilaterally.  No aneurysm or vessel malformation.  IMPRESSION: Intracranial atherosclerotic type changes as detailed above.  This has been made a PRA call report utilizing dashboard call feature.   Original Report Authenticated By: Lacy Duverney, M.D.    Mr Mra Head/brain Wo Cm  02/18/2012  *RADIOLOGY REPORT*  Clinical Data:  Slurred speech.  Weakness.  MRI BRAIN WITHOUT CONTRAST MRA HEAD WITHOUT CONTRAST  Technique: Multiplanar, multiecho pulse sequences of the brain and surrounding structures were obtained according to standard protocol without intravenous contrast.  Angiographic images of the head were obtained using MRA technique without contrast.  Comparison: 02/17/2012 CT.  No comparison MR.  MRI HEAD  Findings:  Acute small non hemorrhagic infarct extends from the mid left corona radiata into the posterior limb of the left internal capsule.  Remote infarcts involving the corona radiata and basal ganglia bilaterally, right cerebellum, right paracentral pons and right thalamus.  Marked small vessel disease type changes.  Minimal amount of blood breakdown products associated with remote left caudate infarct otherwise no evidence of intracranial hemorrhage.  No intracranial mass lesion detected on this unenhanced exam.  Global atrophy without hydrocephalus.  Mild exophthalmos.  IMPRESSION: Acute small non hemorrhagic infarct extends from the mid left corona radiata into the posterior limb of the left internal capsule.  Remote infarcts and small vessel disease type changes with  atrophy as detailed above  MRA HEAD  Findings: Anterior circulation without large vessel significant stenosis or occlusion.  Mild to moderate narrowing distal A1 segment of the left anterior cerebral artery.  Middle cerebral artery branch  vessel irregularity bilaterally.  Left vertebral artery is dominant.  Moderate narrowing of the right vertebral artery after the takeoff of the right PICA.  Mild narrowing of the left vertebral artery after the takeoff of the left PICA.  Mild irregularity and slight narrowing involving portions of the basilar artery without high-grade stenosis.  Nonvisualization AICAs.  Mild irregularity of the superior cerebellar arteries and posterior cerebral arteries bilaterally.  No aneurysm or vessel malformation.  IMPRESSION: Intracranial atherosclerotic type changes as detailed above.  This has been made a PRA call report utilizing dashboard call feature.   Original Report Authenticated By: Lacy Duverney, M.D.     Assessment: 61 y.o. male presenting with acute left basal ganglia, internal capsular and corona radiata ischemic infarction. Mild left side weakness is probably secondary to his previous right pontine infarction.  Stroke Risk Factors - hyperlipidemia  Plan: 1. HgbA1c, fasting lipid panel 3. PT consult, OT consult, Speech consult 3 Echocardiogram 4. Carotid dopplers 5. Prophylactic therapy-Antiplatelet med: Aspirin 325 mg per day 6. Risk factor modification 7. Telemetry monitoring  C.R. Roseanne Reno, MD [facility] [phone]  02/18/2012, 4:53 PM

## 2012-02-18 NOTE — ED Provider Notes (Signed)
Medical screening examination/treatment/procedure(s) were performed by non-physician practitioner and as supervising physician I was immediately available for consultation/collaboration.   Lataria Courser L Jenna Routzahn, MD 02/18/12 2207 

## 2012-02-18 NOTE — ED Notes (Signed)
No change in pt status, has been sleeping. Now awake and aware of impending transport to [facility].

## 2012-02-18 NOTE — ED Notes (Signed)
Sleeping, waiting for bed assignment 

## 2012-02-18 NOTE — Progress Notes (Signed)
*  PRELIMINARY RESULTS* Echocardiogram 2D Echocardiogram has been performed.  Walter [name] 02/18/2012, 3:57 PM 

## 2012-02-18 NOTE — H&P (Signed)
[facility] History and Physical  Walter [name]  ZOX:[id number]  DOB: 1950-08-09   DOA: 02/18/2012   PCP:   Rudi Heap, MD   Chief Complaint:  Dizziness and dysarthria for 2 days  HPI: Walter [name] is an 61 y.o. male.   Middle-aged Caucasian gentleman ongoing tobacco smoker, remote history of stroke without residual deficit, recurrent episodes of TIA last episode about one year ago, noncompliance with aspirin, and in fact takes no at all, reports he was in his baseline state of health until last night when he had episodes of dizziness which had worn off by morning. He felt fine on to later in the day he started to notice he was having difficulty and he thinks his left arm felt strange and his wife says he wasn't walking properly. He was brought to the emergency room and CT scan of his brain was abnormal and the hospitalist service called to assist with management.  He denies fever or headache, but when the problem started yesterday he took one aspirin.  He gives a history of having been told that he had an abnormal EKG which shows a remote MI, but has never had a symptomatic MI.  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  Rewiew of Systems:   All systems negative except as marked bold or noted in the HPI;  Constitutional: Negative for malaise, fever and chills. ;  Eyes: Negative for eye pain, redness and discharge. ;  ENMT: Negative for ear pain, hoarseness, nasal congestion, sinus pressure and sore throat. ;  Cardiovascular: Negative for chest pain, palpitations, diaphoresis, dyspnea and peripheral edema. ;  Respiratory: Negative for cough, hemoptysis, wheezing and stridor. ;  Gastrointestinal: Negative for nausea, vomiting, diarrhea, constipation, abdominal pain, melena, blood in stool, hematemesis, jaundice and rectal bleeding. unusual weight loss..   Genitourinary: Negative for frequency, dysuria, incontinence,flank pain and  hematuria; Musculoskeletal: Negative for back pain and neck pain. Negative for swelling and trauma.;  Skin: . Negative for pruritus, rash, abrasions, bruising and skin lesion.; ulcerations Neuro: Negative for headache, lightheadedness and neck stiffness. Negative for weakness, altered level of consciousness , altered mental status, extremity weakness, burning feet, involuntary movement, seizure and syncope.  Psych: negative for anxiety, depression, insomnia, tearfulness, panic attacks, hallucinations, paranoia, suicidal or homicidal ideation    Past Medical History  Diagnosis Date  . Stroke 1997  . COPD (chronic obstructive pulmonary disease)   . Coronary artery disease   . Myocardial infarction     incidental  . Brain TIA     recurrent     Past Surgical History  Procedure Date  . Back surgery   . Knee surgery     Medications:  HOME MEDS: Prior to Admission medications   Medication Sig Start Date End Date Taking? Authorizing Provider  aspirin 325 MG tablet Take 325 mg by mouth once as needed. For pain   Yes Historical Provider, MD     Allergies:  No Known Allergies  Social History:   reports that he has been smoking.  His smokeless tobacco use includes Chew. He reports that he does not drink alcohol or use illicit drugs.  Family History: Family History  Problem Relation Age of Onset  . Cancer Father 12    stomach  . Stroke Neg Hx   . Coronary artery disease Mother 8  . Coronary artery disease Brother 38    AMI deceased  Physical Exam: Filed Vitals:   02/17/12 2027 02/17/12 2332  BP: 166/86   Pulse: 82   Temp: 98.1 F (36.7 C) 97.7 F (36.5 C)  TempSrc: Oral   Resp: 14   Height: 6\' 1"  (1.854 m)   Weight: 81.647 kg (180 lb)   SpO2: 95%    Blood pressure 166/86, pulse 82, temperature 97.7 F (36.5 C), temperature source Oral, resp. rate 14, height 6\' 1"  (1.854 m), weight 81.647 kg (180 lb), SpO2 95.00%.  GEN:  Pleasant Caucasian gentleman lying in  the stretcher in no acute distress, but forgot to understand him because of his dysarthria ; cooperative with exam PSYCH:  alert and oriented x4; does not appear anxious or depressed; affect is appropriate. HEENT: Mucous membranes pink, dry, and anicteric; PERRLA; EOM intact; no cervical lymphadenopathy nor thyromegaly or carotid bruit; no JVD; Breasts:: Not examined CHEST WALL: No tenderness CHEST: Normal respiration, clear to auscultation bilaterally HEART: Regular rate and rhythm; no murmurs rubs or gallops BACK: No kyphosis or scoliosis; no CVA tenderness ABDOMEN:  scaphoid soft non-tender; no masses, no organomegaly, normal abdominal bowel sounds; no pannus; no intertriginous candida. Rectal Exam: Not done EXTREMITIES: ; age-appropriate arthropathy of the hands and knees; no edema; no ulcerations. Genitalia: not examined PULSES: 2+ and symmetric SKIN: Normal hydration no rash or ulceration CNS: Apart from the dysarthria, Cranial nerves 2-12, apart from the dysarthria  grossly intact no focal lateralizing neurologic deficit; reflexes are downgoing bilaterally   Labs on Admission:  Basic Metabolic Panel:  Lab 02/17/12 1191  NA 136  K 3.8  CL 101  CO2 23  GLUCOSE 100*  BUN 16  CREATININE 0.84  CALCIUM 9.4  MG --  PHOS --   Liver Function Tests: No results found for this basename: AST:5,ALT:5,ALKPHOS:5,BILITOT:5,PROT:5,ALBUMIN:5 in the last 168 hours No results found for this basename: LIPASE:5,AMYLASE:5 in the last 168 hours No results found for this basename: AMMONIA:5 in the last 168 hours CBC:  Lab 02/17/12 2108  WBC 9.2  NEUTROABS 4.5  HGB 15.9  HCT 46.7  MCV 88.3  PLT 238   Cardiac Enzymes: No results found for this basename: CKTOTAL:5,CKMB:5,CKMBINDEX:5,TROPONINI:5 in the last 168 hours BNP: No components found with this basename: POCBNP:5 D-dimer: No components found with this basename: D-DIMER:5 CBG: No results found for this basename: GLUCAP:5 in the  last 168 hours  Radiological Exams on Admission: Ct Head Wo Contrast  02/17/2012  *RADIOLOGY REPORT*  Clinical Data: Slurred speech, weakness, dizziness.  CT HEAD WITHOUT CONTRAST  Technique:  Contiguous axial images were obtained from the base of the skull through the vertex without contrast.  Comparison: 11/19/2009  Findings: Mild prominence of the sulci, cisterns, and ventricles, in keeping with volume loss. There are subcortical and periventricular white matter hypodensities, a nonspecific finding most often seen with chronic microangiopathic changes.  There is no evidence for acute hemorrhage, overt hydrocephalus, mass lesion, or abnormal extra-axial fluid collection.  No definite CT evidence for acute cortical based (large artery) infarction. Multiple bilateral basal ganglia lacunar infarctions are favored remote however, progressed from 2011.  Mild ethmoid air cell opacification.  Otherwise, the visualized paranasal sinuses and mastoid air cells are predominately clear.  IMPRESSION: White matter changes and bilateral remote appearing basal ganglia lacunar infarctions, progressed from 2011.  Given the clinical presentation and evidence of multiple prior infarcts, MRI follow-up is reasonable if concern for acute ischemia persists.  Mild ethmoid air cell opacification.  Correlate clinically for early/mild sinusitis.   Original Report  Authenticated By: Jearld Lesch, M.D.     EKG: Independently reviewed. Normal sinus rhythm, left axis deviation; no Q waves or ST segment abnormalities  Assessment/Plan Present on Admission:  . CVA (cerebral infarction, possibly small brainstem infarct  . Tobacco abuse . COPD (chronic obstructive pulmonary disease) stable   PLAN: Discussed with this gentleman the importance of secondary preventative measures for strokes especially in light of his recurrent TIAs. Will start hydration and that may tend to [facility] for further imaging studies and a  neurology evaluation.  Start statin and further risk stratification  Other plans as per orders.  Code Status: FULL CODE  Family Communication: His wife was present throughout the interview and exam  Disposition depending on results of other studies such as MRI 2-D echo carotid Doppler  Murray Durrell Nocturnist [facility] Pager [phone]  02/18/2012, 1:22 AM

## 2012-02-18 NOTE — Progress Notes (Signed)
OT Cancellation Note  Patient Details Name: DRISTON ADERHOLT MRN: [id number] DOB: [birth year]   Cancelled Treatment:    Reason Eval/Treat Not Completed: Medical issues which prohibited therapy (order to begin OT starts 02/19/12). Will return 11/17 for OT eval.  02/18/2012 Cipriano Mile OTR/L Pager [phone] Office [phone]

## 2012-02-18 NOTE — Progress Notes (Signed)
Call from monitor tech, reported VT with Tachycardia. Reviewed strip HR=100, not true VT, patient asymptomatic. Will continue to monitor patient.

## 2012-02-18 NOTE — Progress Notes (Signed)
[facility] progress note. Chief complaint. Transfer note. History of present illness. 61 year old male with prior history of stroke experienced an episode of dizziness yesterday evening. Later the patient began to notice difficulty with ambulation. He was brought to the emergency room at [facility] and a CT scan of the brain indicated white matter changes and bilateral remote appearing basal ganglia lacunar infarctions, progressed from 2011. Patient to was felt to call her transfer to [facility] and has now arrived. He was felt to require an MRI imaging and a neurology evaluation and these are pending. The patient now has no specific complaints either dysarthria or dizziness. He denies chest pain or dyspnea. Denies abdominal pain or nausea. I'm seeing the patient to ensure he remained stable post transfer and his orders have transferred appropriately as well. Vital signs. Temperature 97.9, pulse 63, respiration 18, blood pressure 147/76. O2 sats 100%. General appearance. Well-developed elderly male who is alert, cooperative and oriented. Cardiac. Rate and rhythm regular. No murmur, S3, S4. Lungs. Breath sounds clear and equal bilaterally. Abdomen. Soft with positive bowel sounds. No pain. Neurologic. Cranial nerves 2-12 grossly intact. No unilateral or focal defects. Speech 80 slightly dysarthric but the patient himself states that his speech is normal for him. Impression/plan. Problem #1. CVA versus TIA. Patient appears medically stable post transfer. Has no specific complaints and denies any questions at this time. For a MRI and MRA of the brain later this a.m. All other orders appear to have transferred appropriately.

## 2012-02-19 MED ORDER — NICOTINE 21 MG/24HR TD PT24: 1.0000 | MEDICATED_PATCH | Freq: Every day | TRANSDERMAL | Status: DC | PRN

## 2012-02-19 MED ORDER — SIMVASTATIN 20 MG PO TABS: 20.0000 mg | ORAL_TABLET | Freq: Every day | ORAL | Status: DC

## 2012-02-19 NOTE — Evaluation (Addendum)
Physical Therapy Evaluation Patient Details Name: Walter [name] MRN: [id number] DOB: Feb 13, 1951 Today's Date: 02/19/2012 Time: 4098-1191 PT Time Calculation (min): 35 min  PT Assessment / Plan / Recommendation Clinical Impression  Patient is a 61 yo male admitted with Lt. CVA - dizziness, slurred speech, gait deviations.  Patient with weakness primarily on lt. side impacting balance/mobility. Patient scored 15/24 on DGI balance assessment (scores < 19 indicate falls risk.)  Recommended patient use cane for stability, and have 24 hour supervision.  Also recommended OP PT for balance training.  Patient will benefit from acute PT to maximize independence prior to discharge.    PT Assessment  Patient needs continued PT services    Follow Up Recommendations  Outpatient PT;Supervision for mobility/OOB    Does the patient have the potential to tolerate intense rehabilitation      Barriers to Discharge        Equipment Recommendations  None recommended by PT    Recommendations for Other Services     Frequency Min 4X/week    Precautions / Restrictions Precautions Precautions: Fall Restrictions Weight Bearing Restrictions: No   Pertinent Vitals/Pain       Mobility  Bed Mobility Bed Mobility: Supine to Sit;Sitting - Scoot to Edge of Bed Supine to Sit: 4: Min guard;With rails;HOB flat Sitting - Scoot to Edge of Bed: 4: Min guard;With rail Details for Bed Mobility Assistance: Cues to move more slowly for safety. Transfers Transfers: Sit to Stand;Stand to Sit Sit to Stand: 4: Min guard;With upper extremity assist;From bed Stand to Sit: 4: Min guard;With upper extremity assist;With armrests;To chair/3-in-1 Details for Transfer Assistance: Verbal cues for safety.  Patient with slight decrease in balance during initial stance - leaning posteriorly. Ambulation/Gait Ambulation/Gait Assistance: 4: Min assist Ambulation Distance (Feet): 280 Feet Assistive device: None Ambulation/Gait  Assistance Details: Patient with decreased balance - staggering x 4 during gait.  Patient required min assist to regain balance. Gait Pattern: Step-through pattern (Staggering gait) Gait velocity: WFL - encouraged to slow gait for safety Stairs: Yes Stairs Assistance: 4: Min guard Stair Management Technique: No rails;Alternating pattern;Forwards Number of Stairs: 5  Modified Rankin (Stroke Patients Only) Pre-Morbid Rankin Score: No significant disability Modified Rankin: Moderately severe disability           PT Diagnosis: Abnormality of gait;Altered mental status  PT Problem List: Decreased strength;Decreased balance;Decreased mobility;Decreased cognition;Decreased knowledge of use of DME;Decreased safety awareness PT Treatment Interventions: DME instruction;Gait training;Stair training;Functional mobility training;Balance training;Neuromuscular re-education;Patient/family education;Cognitive remediation   PT Goals Acute Rehab PT Goals PT Goal Formulation: With patient/family Time For Goal Achievement: 02/26/12 Potential to Achieve Goals: Good Pt will go Sit to Stand: with modified independence;with upper extremity assist PT Goal: Sit to Stand - Progress: Goal set today Pt will Ambulate: >150 feet;with modified independence;with cane (without loss of balance) PT Goal: Ambulate - Progress: Goal set today Pt will Go Up / Down Stairs: 3-5 stairs;with supervision;with rail(s);with least restrictive assistive device PT Goal: Up/Down Stairs - Progress: Goal set today  Visit Information  Last PT Received On: 02/19/12 Assistance Needed: +1    Subjective Data  Subjective: Minimal conversation.  Laughing to ignore questions.  Or looks to wife to answer.  Patient Stated Goal: To go home soon.   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 - stays with her every other night Prior Function Level of Independence: Independent Able to Take Stairs?: Yes Driving: Yes Vocation: On disability Communication Communication: Expressive difficulties (Per wife, this is baseline) Dominant Hand: Right    Cognition  Overall Cognitive Status: Impaired Area of Impairment: Safety/judgement;Awareness of deficits;Problem solving Arousal/Alertness: Awake/alert Orientation Level: Appears intact for tasks assessed Behavior During Session: Other (comment) (Laughing when asked questions or discussing stroke preventio) Safety/Judgement: Decreased safety judgement for tasks assessed;Impulsive Safety/Judgement - Other Comments: Impulsive.  Doesn't acknowledge that he needs assistance for ambulation for safety. Awareness of Deficits: Not recognizing impact of stroke on function and safety.  Asking if he can drive.    Extremity/Trunk Assessment Right Upper Extremity Assessment RUE ROM/Strength/Tone: WFL for tasks assessed RUE Sensation: WFL - Light Touch Left Upper Extremity Assessment LUE ROM/Strength/Tone: Deficits LUE ROM/Strength/Tone Deficits: Strength grossly 4/5.  Drift with eyes closed. Right Lower Extremity Assessment RLE ROM/Strength/Tone: WFL for tasks assessed RLE Sensation: WFL - Light Touch Left Lower Extremity Assessment LLE ROM/Strength/Tone: Deficits LLE ROM/Strength/Tone Deficits: Strength grossly 4/5 with dorsiflexion 3+/5 LLE Sensation: WFL - Light Touch Trunk Assessment Trunk Assessment: Normal   Balance Balance Balance Assessed: Yes Standardized Balance Assessment Standardized Balance Assessment: Dynamic Gait Index Dynamic Gait Index Level Surface: Mild  Impairment Change in Gait Speed: Mild Impairment Gait with Horizontal Head Turns: Moderate Impairment Gait with Vertical Head Turns: Moderate Impairment Gait and Pivot Turn: Mild Impairment Step Over Obstacle: Mild Impairment Step Around Obstacles: Mild Impairment Steps: Normal Total Score: 15   End of Session PT - End of Session Equipment Utilized During Treatment: Gait belt Activity Tolerance: Patient tolerated treatment well Patient left: in chair;with call bell/phone within reach;with family/visitor present Nurse Communication: Mobility status  GP     Vena Austria 02/19/2012, 1:55 PM Durenda Hurt. Renaldo Fiddler, [facility] Acute Rehab Services Pager [phone]

## 2012-02-19 NOTE — Progress Notes (Signed)
Stroke Team Progress Note  HISTORY  Walter [name] is an 61 y.o. male a history of previous stroke and multiple TIAs, coronary artery disease, hyperlipidemia and COPD, who was brought to the hospital following onset of episode of dizziness as well as onset of slurred speech. His wife also noted his walking had changed. CT scan of his head was unremarkable except for old small vessel ischemic changes. MRI of his brain today showed acute small non-hemorrhagic infarction extending from the mid left corona radiata to the posterior limb of left internal capsule. Patient has not been on antiplatelet therapy. NIH stroke score at this point is 2. He was last seen normal at about 1:30 yesterday afternoon.   SUBJECTIVE His wife is at the bedside. Overall he feels his condition is somewhat improved. Walking well with PT.  OBJECTIVE Most recent Vital Signs: Temp: 98 F (36.7 C) (11/17 1023) Temp src: Oral (11/17 1023) BP: 110/68 mmHg (11/17 1023) Pulse Rate: 68  (11/17 1023) Respiratory Rate: 18 O2 Saturation: 100%  CBG (last 3) No results found for this basename: GLUCAP:3 in the last 72 hours Intake/Output from previous day: 11/16 0701 - 11/17 0700 In: 1608.3 [I.V.:1608.3] Out: -   IV Fluid Intake:     . sodium chloride 100 mL/hr at 02/18/12 1235   Medications    . aspirin  300 mg Rectal Daily   Or  . aspirin  325 mg Oral Daily  . enoxaparin  40 mg Subcutaneous Q24H  . [EXPIRED] pneumococcal 23 valent vaccine  0.5 mL Intramuscular Tomorrow-1000  . simvastatin  20 mg Oral q1800  PRN acetaminophen, bisacodyl, nicotine, ondansetron (ZOFRAN) IV, senna-docusate, sodium phosphate  Diet:  Cardiac thin liquids Activity:  Up with assistance DVT Prophylaxis:  Lovenox  Significant Diagnostic Studies: CBC    Component Value Date/Time   WBC 7.7 02/18/2012 0755   RBC 5.17 02/18/2012 0755   HGB 15.3 02/18/2012 0755   HCT 46.1 02/18/2012 0755   PLT 207 02/18/2012 0755   MCV 89.2 02/18/2012  0755   MCH 29.6 02/18/2012 0755   MCHC 33.2 02/18/2012 0755   RDW 13.6 02/18/2012 0755   LYMPHSABS 3.4 02/17/2012 2108   MONOABS 1.0 02/17/2012 2108   EOSABS 0.3 02/17/2012 2108   BASOSABS 0.0 02/17/2012 2108   CMP    Component Value Date/Time   NA 136 02/17/2012 2108   K 3.8 02/17/2012 2108   CL 101 02/17/2012 2108   CO2 23 02/17/2012 2108   GLUCOSE 100* 02/17/2012 2108   BUN 16 02/17/2012 2108   CREATININE 0.79 02/18/2012 0755   CALCIUM 9.4 02/17/2012 2108   GFRNONAA >90 02/18/2012 0755   GFRAA >90 02/18/2012 0755   COAGS Lab Results  Component Value Date   INR 0.97 02/17/2012   Lipid Panel    Component Value Date/Time   CHOL 204* 02/18/2012 0755   TRIG 145 02/18/2012 0755   HDL 27* 02/18/2012 0755   CHOLHDL 7.6 02/18/2012 0755   VLDL 29 02/18/2012 0755   LDLCALC 148* 02/18/2012 0755   HgbA1C  Lab Results  Component Value Date   HGBA1C 5.8* 02/18/2012   Urine Drug Screen  No results found for this basename: labopia, cocainscrnur, labbenz, amphetmu, thcu, labbarb    Alcohol Level No results found for this basename: eth     No results found for this or any previous visit (from the past 24 hour(s)).  Dg Chest 2 View  02/18/2012  *RADIOLOGY REPORT*  Clinical Data: Stroke  CHEST -  2 VIEW  Comparison: 07/18/2005  Findings: Chronic interstitial markings/emphysematous changes. No pleural effusion or pneumothorax.  Cardiomediastinal silhouette is within normal limits.  Mild degenerative changes of the visualized thoracolumbar spine.  IMPRESSION: No evidence of acute cardiopulmonary disease.   Original Report Authenticated By: Charline Bills, M.D.    Ct Head Wo Contrast  02/17/2012  *RADIOLOGY REPORT*  Clinical Data: Slurred speech, weakness, dizziness.  CT HEAD WITHOUT CONTRAST  Technique:  Contiguous axial images were obtained from the base of the skull through the vertex without contrast.  Comparison: 11/19/2009  Findings: Mild prominence of the sulci, cisterns,  and ventricles, in keeping with volume loss. There are subcortical and periventricular white matter hypodensities, a nonspecific finding most often seen with chronic microangiopathic changes.  There is no evidence for acute hemorrhage, overt hydrocephalus, mass lesion, or abnormal extra-axial fluid collection.  No definite CT evidence for acute cortical based (large artery) infarction. Multiple bilateral basal ganglia lacunar infarctions are favored remote however, progressed from 2011.  Mild ethmoid air cell opacification.  Otherwise, the visualized paranasal sinuses and mastoid air cells are predominately clear.  IMPRESSION: White matter changes and bilateral remote appearing basal ganglia lacunar infarctions, progressed from 2011.  Given the clinical presentation and evidence of multiple prior infarcts, MRI follow-up is reasonable if concern for acute ischemia persists.  Mild ethmoid air cell opacification.  Correlate clinically for early/mild sinusitis.   Original Report Authenticated By: Jearld Lesch, M.D.    Mr Brain Wo Contrast  02/18/2012  *RADIOLOGY REPORT*  Clinical Data:  Slurred speech.  Weakness.  MRI BRAIN WITHOUT CONTRAST MRA HEAD WITHOUT CONTRAST  Technique: Multiplanar, multiecho pulse sequences of the brain and surrounding structures were obtained according to standard protocol without intravenous contrast.  Angiographic images of the head were obtained using MRA technique without contrast.  Comparison: 02/17/2012 CT.  No comparison MR.  MRI HEAD  Findings:  Acute small non hemorrhagic infarct extends from the mid left corona radiata into the posterior limb of the left internal capsule.  Remote infarcts involving the corona radiata and basal ganglia bilaterally, right cerebellum, right paracentral pons and right thalamus.  Marked small vessel disease type changes.  Minimal amount of blood breakdown products associated with remote left caudate infarct otherwise no evidence of intracranial  hemorrhage.  No intracranial mass lesion detected on this unenhanced exam.  Global atrophy without hydrocephalus.  Mild exophthalmos.  IMPRESSION: Acute small non hemorrhagic infarct extends from the mid left corona radiata into the posterior limb of the left internal capsule.  Remote infarcts and small vessel disease type changes with atrophy as detailed above  MRA HEAD  Findings: Anterior circulation without large vessel significant stenosis or occlusion.  Mild to moderate narrowing distal A1 segment of the left anterior cerebral artery.  Middle cerebral artery branch vessel irregularity bilaterally.  Left vertebral artery is dominant.  Moderate narrowing of the right vertebral artery after the takeoff of the right PICA.  Mild narrowing of the left vertebral artery after the takeoff of the left PICA.  Mild irregularity and slight narrowing involving portions of the basilar artery without high-grade stenosis.  Nonvisualization AICAs.  Mild irregularity of the superior cerebellar arteries and posterior cerebral arteries bilaterally.  No aneurysm or vessel malformation.  IMPRESSION: Intracranial atherosclerotic type changes as detailed above.  This has been made a PRA call report utilizing dashboard call feature.   Original Report Authenticated By: Lacy Duverney, M.D.    Mr Mra Head/brain Wo Cm  02/18/2012  *RADIOLOGY  REPORT*  Clinical Data:  Slurred speech.  Weakness.  MRI BRAIN WITHOUT CONTRAST MRA HEAD WITHOUT CONTRAST  Technique: Multiplanar, multiecho pulse sequences of the brain and surrounding structures were obtained according to standard protocol without intravenous contrast.  Angiographic images of the head were obtained using MRA technique without contrast.  Comparison: 02/17/2012 CT.  No comparison MR.  MRI HEAD  Findings:  Acute small non hemorrhagic infarct extends from the mid left corona radiata into the posterior limb of the left internal capsule.  Remote infarcts involving the corona radiata and  basal ganglia bilaterally, right cerebellum, right paracentral pons and right thalamus.  Marked small vessel disease type changes.  Minimal amount of blood breakdown products associated with remote left caudate infarct otherwise no evidence of intracranial hemorrhage.  No intracranial mass lesion detected on this unenhanced exam.  Global atrophy without hydrocephalus.  Mild exophthalmos.  IMPRESSION: Acute small non hemorrhagic infarct extends from the mid left corona radiata into the posterior limb of the left internal capsule.  Remote infarcts and small vessel disease type changes with atrophy as detailed above  MRA HEAD  Findings: Anterior circulation without large vessel significant stenosis or occlusion.  Mild to moderate narrowing distal A1 segment of the left anterior cerebral artery.  Middle cerebral artery branch vessel irregularity bilaterally.  Left vertebral artery is dominant.  Moderate narrowing of the right vertebral artery after the takeoff of the right PICA.  Mild narrowing of the left vertebral artery after the takeoff of the left PICA.  Mild irregularity and slight narrowing involving portions of the basilar artery without high-grade stenosis.  Nonvisualization AICAs.  Mild irregularity of the superior cerebellar arteries and posterior cerebral arteries bilaterally.  No aneurysm or vessel malformation.  IMPRESSION: Intracranial atherosclerotic type changes as detailed above.  This has been made a PRA call report utilizing dashboard call feature.   Original Report Authenticated By: Lacy Duverney, M.D.     CT of the brain   IMPRESSION:  White matter changes and bilateral remote appearing basal ganglia  lacunar infarctions, progressed from 2011. Given the clinical  presentation and evidence of multiple prior infarcts, MRI follow-up  is reasonable if concern for acute ischemia persists.   CT angio  Not ordered  MRI of the brain   IMPRESSION:  Acute small non hemorrhagic infarct extends  from the mid left  corona radiata into the posterior limb of the left internal  capsule.  Remote infarcts and small vessel disease type changes with atrophy  as detailed above   MRA of the brain   Mild to moderate narrowing distal A1 segment of the left anterior  cerebral artery.  Middle cerebral artery branch vessel irregularity bilaterally.  Left vertebral artery is dominant.  Moderate narrowing of the right vertebral artery after the takeoff  of the right PICA. Mild narrowing of the left vertebral artery  after the takeoff of the left PICA.  Mild irregularity and slight narrowing involving portions of the  basilar artery without high-grade stenosis.  Nonvisualization AICAs.  Mild irregularity of the superior cerebellar arteries and posterior  cerebral arteries bilaterally.  No aneurysm or vessel malformation.   2D Echocardiogram  Done, result pending  Carotid Doppler   Vascular Ultrasound  Carotid Duplex (Doppler) has been completed. Preliminary findings: Bilateral: No evidence of hemodynamically significant internal carotid artery stenosis. Vertebral artery flow is antegrade.    CXR   IMPRESSION:  No evidence of acute cardiopulmonary disease.   EKG   Normal sinus rhythm Left  axis deviation Nonspecific ST abnormality Abnormal ECG  Physical Exam   The patient is alert and cooperative.  Neurologic exam reveals full extraocular movements, speech is dysarthric, not aphasic. Visual fields are full.  Motor testing reveals good strength of all four extremities.  The patient has good finger-nose-finger and heel-to-shin bilaterally. Gait was not tested.  Deep tendon reflexes are symmetric and normal. Toes are down going bilaterally.    ASSESSMENT Mr. ARLYNN VEASLEY is a 61 y.o. male with a left corona radiata infarct, secondary to small vessel disease. Not on antiplatelet agents prior to admission.   Hospital day # 2  The patient presents with a 24-hour history of  slurred speech. The patient was found to have a left centrum semiovale infarct. The patient was not on antiplatelet agents prior to admission. The patient has a prior history of cerebrovascular disease. At this point, the stroke workup has been completed. The 2-D echocardiogram has been done, but the results are still pending. The patient is on aspirin at this point.   TREATMENT/PLAN  -2 D echo pending -aspirin therapy -OK for discharge to home -Followup with [facility] in 4 to 6 weeks   Lesly Dukes

## 2012-02-19 NOTE — Progress Notes (Deleted)
*  PRELIMINARY RESULTS* Echocardiogram 2D Echocardiogram has been performed.  Walter [name] 02/19/2012, 7:44 AM

## 2012-02-19 NOTE — Discharge Summary (Signed)
Physician Discharge Summary  Walter [name] ZOX:[id number] DOB: 09-23-1950 DOA: 02/17/2012  PCP: Rudi Heap, MD  Admit date: 02/17/2012 Discharge date: 02/19/2012  Time spent: 18 minutes  Recommendations for Outpatient Follow-up:  1. Needs outpatient tobacco cessation counseling 2. Needs outpatient lipid panel in 3 months 3. Needs followup with neurologist as an outpatient 4. Please followup echocardiogram done which is not reported upon today 02/19/2012    Discharge Diagnoses:  Principal Problem:  *CVA (cerebral infarction) Active Problems:  Tobacco abuse  Non-compliance  COPD (chronic obstructive pulmonary disease)   Discharge Condition: Stable  Diet recommendation: Heart healthy low-salt  Filed Weights   02/17/12 2027 02/18/12 0400  Weight: 81.647 kg (180 lb) 72.7 kg (160 lb 4.4 oz)    History of present illness:  Walter [name] is an 61 y.o. male. Middle-aged Caucasian gentleman ongoing tobacco smoker, remote history of stroke without residual deficit, recurrent episodes of TIA last episode about one year ago, noncompliance with aspirin, and in fact takes no at all, reports he was in his baseline state of health until 11.16 when he had episodes of dizziness which had worn off by morning. He felt fine on to later in the day he started to notice he was having difficulty and he thinks his left arm felt strange and his wife says he wasn't walking properly. He was brought to the emergency room and CT scan of his brain was abnormal and the hospitalist service called to assist with management.  He was brought in for further evaluation from [facility] to [facility] and neurology was consulted. He underwent the usual workup inclusive of MRI, echocardiogram, carotid Dopplers, fasting lipid panel  His MRI showed an acute left basal ganglia internal capsule and corona radiata isch infarct  Neurology recommended aspirin 325 mg scheduled He's been discharged home on nicotine  patch.  He'll need close followup with his regular physician and a 2 month appointment with neurologist  Discharge Exam: Filed Vitals:   02/18/12 2058 02/19/12 0233 02/19/12 0621 02/19/12 1023  BP: 102/55 109/43 107/63 110/68  Pulse: 74 57 66 68  Temp: 97.8 F (36.6 C) 98.1 F (36.7 C) 98.2 F (36.8 C) 98 F (36.7 C)  TempSrc: Oral Oral Oral Oral  Resp: 18 17 18 18   Height:      Weight:      SpO2: 100% 100% 100% 100%    Well no problems no concerns overnight  Discharge Instructions  Discharge Orders    Future Orders Please Complete By Expires   Diet - low sodium heart healthy      Increase activity slowly      Call MD for:  temperature >100.4      Call MD for:  persistant nausea and vomiting      Call MD for:  severe uncontrolled pain      Call MD for:  difficulty breathing, headache or visual disturbances      Call MD for:  hives      Call MD for:  persistant dizziness or light-headedness          Medication List     As of 02/19/2012  1:59 PM    TAKE these medications         aspirin 325 MG tablet   Take 325 mg by mouth once as needed. For pain      nicotine 21 mg/24hr patch   Commonly known as: NICODERM CQ - dosed in mg/24 hours   Place 1  patch onto the skin daily as needed (nicotine withdrawal).      simvastatin 20 MG tablet   Commonly known as: ZOCOR   Take 1 tablet (20 mg total) by mouth daily at 6 PM.           Follow-up Information    Follow up with Rudi Heap, MD.   Contact information:   [address] [place] [address] [phone]       Follow up with Lesly Dukes, MD. Schedule an appointment as soon as possible for a visit in 2 months.   Contact information:   [address], [id number] [facility] [id number] [phone]           The results of significant diagnostics from this hospitalization (including imaging, microbiology, ancillary and laboratory) are listed below for  reference.    Significant Diagnostic Studies: Dg Chest 2 View  02/18/2012  *RADIOLOGY REPORT*  Clinical Data: Stroke  CHEST - 2 VIEW  Comparison: 07/18/2005  Findings: Chronic interstitial markings/emphysematous changes. No pleural effusion or pneumothorax.  Cardiomediastinal silhouette is within normal limits.  Mild degenerative changes of the visualized thoracolumbar spine.  IMPRESSION: No evidence of acute cardiopulmonary disease.   Original Report Authenticated By: Charline Bills, M.D.    Ct Head Wo Contrast  02/17/2012  *RADIOLOGY REPORT*  Clinical Data: Slurred speech, weakness, dizziness.  CT HEAD WITHOUT CONTRAST  Technique:  Contiguous axial images were obtained from the base of the skull through the vertex without contrast.  Comparison: 11/19/2009  Findings: Mild prominence of the sulci, cisterns, and ventricles, in keeping with volume loss. There are subcortical and periventricular white matter hypodensities, a nonspecific finding most often seen with chronic microangiopathic changes.  There is no evidence for acute hemorrhage, overt hydrocephalus, mass lesion, or abnormal extra-axial fluid collection.  No definite CT evidence for acute cortical based (large artery) infarction. Multiple bilateral basal ganglia lacunar infarctions are favored remote however, progressed from 2011.  Mild ethmoid air cell opacification.  Otherwise, the visualized paranasal sinuses and mastoid air cells are predominately clear.  IMPRESSION: White matter changes and bilateral remote appearing basal ganglia lacunar infarctions, progressed from 2011.  Given the clinical presentation and evidence of multiple prior infarcts, MRI follow-up is reasonable if concern for acute ischemia persists.  Mild ethmoid air cell opacification.  Correlate clinically for early/mild sinusitis.   Original Report Authenticated By: Jearld Lesch, M.D.    Mr Brain Wo Contrast  02/18/2012  *RADIOLOGY REPORT*  Clinical Data:  Slurred  speech.  Weakness.  MRI BRAIN WITHOUT CONTRAST MRA HEAD WITHOUT CONTRAST  Technique: Multiplanar, multiecho pulse sequences of the brain and surrounding structures were obtained according to standard protocol without intravenous contrast.  Angiographic images of the head were obtained using MRA technique without contrast.  Comparison: 02/17/2012 CT.  No comparison MR.  MRI HEAD  Findings:  Acute small non hemorrhagic infarct extends from the mid left corona radiata into the posterior limb of the left internal capsule.  Remote infarcts involving the corona radiata and basal ganglia bilaterally, right cerebellum, right paracentral pons and right thalamus.  Marked small vessel disease type changes.  Minimal amount of blood breakdown products associated with remote left caudate infarct otherwise no evidence of intracranial hemorrhage.  No intracranial mass lesion detected on this unenhanced exam.  Global atrophy without hydrocephalus.  Mild exophthalmos.  IMPRESSION: Acute small non hemorrhagic infarct extends from the mid left corona radiata into the posterior limb of  the left internal capsule.  Remote infarcts and small vessel disease type changes with atrophy as detailed above  MRA HEAD  Findings: Anterior circulation without large vessel significant stenosis or occlusion.  Mild to moderate narrowing distal A1 segment of the left anterior cerebral artery.  Middle cerebral artery branch vessel irregularity bilaterally.  Left vertebral artery is dominant.  Moderate narrowing of the right vertebral artery after the takeoff of the right PICA.  Mild narrowing of the left vertebral artery after the takeoff of the left PICA.  Mild irregularity and slight narrowing involving portions of the basilar artery without high-grade stenosis.  Nonvisualization AICAs.  Mild irregularity of the superior cerebellar arteries and posterior cerebral arteries bilaterally.  No aneurysm or vessel malformation.  IMPRESSION: Intracranial  atherosclerotic type changes as detailed above.  This has been made a PRA call report utilizing dashboard call feature.   Original Report Authenticated By: Lacy Duverney, M.D.    Mr Mra Head/brain Wo Cm  02/18/2012  *RADIOLOGY REPORT*  Clinical Data:  Slurred speech.  Weakness.  MRI BRAIN WITHOUT CONTRAST MRA HEAD WITHOUT CONTRAST  Technique: Multiplanar, multiecho pulse sequences of the brain and surrounding structures were obtained according to standard protocol without intravenous contrast.  Angiographic images of the head were obtained using MRA technique without contrast.  Comparison: 02/17/2012 CT.  No comparison MR.  MRI HEAD  Findings:  Acute small non hemorrhagic infarct extends from the mid left corona radiata into the posterior limb of the left internal capsule.  Remote infarcts involving the corona radiata and basal ganglia bilaterally, right cerebellum, right paracentral pons and right thalamus.  Marked small vessel disease type changes.  Minimal amount of blood breakdown products associated with remote left caudate infarct otherwise no evidence of intracranial hemorrhage.  No intracranial mass lesion detected on this unenhanced exam.  Global atrophy without hydrocephalus.  Mild exophthalmos.  IMPRESSION: Acute small non hemorrhagic infarct extends from the mid left corona radiata into the posterior limb of the left internal capsule.  Remote infarcts and small vessel disease type changes with atrophy as detailed above  MRA HEAD  Findings: Anterior circulation without large vessel significant stenosis or occlusion.  Mild to moderate narrowing distal A1 segment of the left anterior cerebral artery.  Middle cerebral artery branch vessel irregularity bilaterally.  Left vertebral artery is dominant.  Moderate narrowing of the right vertebral artery after the takeoff of the right PICA.  Mild narrowing of the left vertebral artery after the takeoff of the left PICA.  Mild irregularity and slight narrowing  involving portions of the basilar artery without high-grade stenosis.  Nonvisualization AICAs.  Mild irregularity of the superior cerebellar arteries and posterior cerebral arteries bilaterally.  No aneurysm or vessel malformation.  IMPRESSION: Intracranial atherosclerotic type changes as detailed above.  This has been made a PRA call report utilizing dashboard call feature.   Original Report Authenticated By: Lacy Duverney, M.D.     Microbiology: No results found for this or any previous visit (from the past 240 hour(s)).   Labs: Basic Metabolic Panel:  Lab 02/18/12 6295 02/17/12 2108  NA -- 136  K -- 3.8  CL -- 101  CO2 -- 23  GLUCOSE -- 100*  BUN -- 16  CREATININE 0.79 0.84  CALCIUM -- 9.4  MG -- --  PHOS -- --   Liver Function Tests: No results found for this basename: AST:5,ALT:5,ALKPHOS:5,BILITOT:5,PROT:5,ALBUMIN:5 in the last 168 hours No results found for this basename: LIPASE:5,AMYLASE:5 in the last 168 hours No  results found for this basename: AMMONIA:5 in the last 168 hours CBC:  Lab 02/18/12 0755 02/17/12 2108  WBC 7.7 9.2  NEUTROABS -- 4.5  HGB 15.3 15.9  HCT 46.1 46.7  MCV 89.2 88.3  PLT 207 238   Cardiac Enzymes: No results found for this basename: CKTOTAL:5,CKMB:5,CKMBINDEX:5,TROPONINI:5 in the last 168 hours BNP: BNP (last 3 results) No results found for this basename: PROBNP:3 in the last 8760 hours CBG: No results found for this basename: GLUCAP:5 in the last 168 hours     Signed:  Rhetta Mura  [facility] 02/19/2012, 1:59 PM

## 2012-02-19 NOTE — Progress Notes (Signed)
Patient discharge instructions reviewed, prescriptions given, all questions answered. Patient discharged home with wife. Patient to have outpatient physical therapy.

## 2012-02-19 NOTE — Evaluation (Signed)
Occupational Therapy Evaluation Patient Details Name: Walter [name] MRN: [id number] DOB: 11/27/1950 Today's Date: 02/19/2012 Time: 4098-1191 OT Time Calculation (min): 29 min  OT Assessment / Plan / Recommendation Clinical Impression  Pt admitted with dizziness and difficulty speaking. MRI shows acute CVA--Acute small non hemorrhagic infarct extends from the mid left corona radiata into the posterior limb of the left internal capsule. RN currently preparing discharge papers for pt to d/c home this afternoon.  Will sign off at this time due to pt discharging. All education complete. Stressed importance of 24/7 supervision at home to both wife and pt.      OT Assessment  Patient does not need any further OT services    Follow Up Recommendations  No OT follow up;Supervision/Assistance - 24 hour    Barriers to Discharge      Equipment Recommendations  None recommended by OT    Recommendations for Other Services    Frequency       Precautions / Restrictions Precautions Precautions: Fall Restrictions Weight Bearing Restrictions: No   Pertinent Vitals/Pain See vitals    ADL  Grooming: Performed;Wash/dry face;Teeth care;Supervision/safety Where Assessed - Grooming: Unsupported standing Lower Body Dressing: Performed;Min guard Where Assessed - Lower Body Dressing: Supported sit to stand Toilet Transfer: Simulated;Min Pension scheme manager Method: Sit to Barista: Other (comment) (chair) Equipment Used: Gait belt Transfers/Ambulation Related to ADLs: supervision with occasional min guard for safety due to occasional lateral sway. ADL Comments: Educated pt and wife in depth on stroke signs and symptoms as well as risk factors.  Also recommended to both pt and wife that pt wait until cleared by MD before driving. Pt frequently laughing throughout session (does not seem aware of deficits). Recommended wife be present 24/7 and also be close by during showers.    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 - stays with her every other night Prior Function Level of Independence: Independent Able to Take Stairs?: Yes Driving: Yes Vocation: On disability Communication Communication: Expressive difficulties (Per wife, this is baseline) Dominant Hand: Right         Vision/Perception     Cognition  Overall Cognitive Status: Impaired Area of Impairment: Safety/judgement;Awareness of deficits;Problem solving Arousal/Alertness: Awake/alert Orientation Level: Appears intact for tasks assessed Behavior During Session: Other (comment) (frequently laughing during education) Safety/Judgement: Decreased safety judgement for tasks assessed;Impulsive Safety/Judgement - Other Comments: impulsively attempting to stand before therapist was near by for assist Awareness of Deficits: Not recognizing impact of stroke on function and safety.  Asking if he can drive.    Extremity/Trunk Assessment Right Upper Extremity Assessment RUE ROM/Strength/Tone: Within functional levels;WFL for tasks assessed Left Upper Extremity Assessment LUE ROM/Strength/Tone: WFL for tasks assessed (4/5 throughout)     Mobility Bed Mobility Bed Mobility: Not assessed Transfers Transfers: Sit to Stand;Stand to Sit Sit to Stand: 4: Min guard;From chair/3-in-1 Stand to Sit: 4: Min guard;To  chair/3-in-1  Shoulder Instructions     Exercise     Balance     End of Session OT - End of Session Equipment Utilized During Treatment: Gait belt Activity Tolerance: Patient tolerated treatment well Patient left: in chair;with call bell/phone within reach;with family/visitor present Nurse Communication: Mobility status  GO    02/19/2012 Cipriano Mile OTR/L Pager [phone] Office [phone]  Cipriano Mile 02/19/2012, 2:53 PM

## 2012-09-07 ENCOUNTER — Ambulatory Visit (INDEPENDENT_AMBULATORY_CARE_PROVIDER_SITE_OTHER): Payer: Medicare Other | Admitting: Family Medicine

## 2012-09-07 ENCOUNTER — Encounter: Payer: Self-pay | Admitting: Family Medicine

## 2012-09-07 VITALS — BP 127/76 | HR 72 | Temp 98.1°F | Ht 71.0 in | Wt 185.2 lb

## 2012-09-07 DIAGNOSIS — E785 Hyperlipidemia, unspecified: Secondary | ICD-10-CM

## 2012-09-07 DIAGNOSIS — I6529 Occlusion and stenosis of unspecified carotid artery: Secondary | ICD-10-CM

## 2012-09-07 MED ORDER — PRAVASTATIN SODIUM 20 MG PO TABS: 20.0000 mg | ORAL_TABLET | Freq: Every day | ORAL | Status: DC

## 2012-09-07 NOTE — Patient Instructions (Signed)
Carotid Artery Disease  The carotid arteries are large blood vessels on both sides of the neck. They carry blood to the brain. Carotid artery disease is when the arteries get smaller (narrow) or get blocked with a fatty, waxy buildup (plaque). If these arteries get smaller or get blocked, you are more likely to have a stroke or warning stroke (TIA).  HOME CARE  Understand all your medicine instructions. Do not stop your medicines without talking to your doctor first.  Follow your doctor's diet instructions.  Keep a healthy weight.  Stay active. Get at least 30 minutes of activity on most or all days.  Do not smoke.  Limit alcohol use to:  No more than 2 drinks a day for men.  No more than 1 drink a day for women who are not pregnant.  Do not use illegal drugs.  Keep all doctor visits as told. GET HELP RIGHT AWAY IF:   You have sudden weakness or loss of feeling (numbness) on one side of the body, such as the face, arm, or leg.  You have sudden confusion.  You have trouble speaking (aphasia) or understanding.  You have sudden trouble seeing out of one or both eyes.  You have sudden trouble walking.  You have dizziness or feel like you might pass out (faint).  You have a loss of balance or your movements are not steady (uncoordinated).  You have a sudden, severe headache with no known cause.  You have trouble swallowing (dysphagia). Call your local emergency services (911 in [place] Do not drive yourself to the clinic or hospital.  Document Released: 03/07/2012 Document Reviewed: 06/01/2011 [facility] Patient Information 2014 [place], [place].

## 2012-09-07 NOTE — Progress Notes (Signed)
°  Subjective    Patient ID: Walter [name], male    DOB: [birth year], 62 y.o.   MRN: [id number]  Patient presents with DMV form to be filled out.  He has hx of tobacco abuse, CVA, carotid artery stenosis bilateral less than 50%, hypertension, hyperlipidemia, mild COPD, and DDD of the lumbar spine.  He has no acute complaints today other than wanting to have his DMV paper work filled out for driving his personal vehicle.  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 He states he went to the doctor for chest pain and did not go to the ED for tx and instead drove a load to the next destination and ignored the doctors advice and therefore he was reported.  He has hx of noncompliance.  He is not on any medications.  Nicotine Dependence Presents for follow-up visit. His urge triggers include company of smokers. The symptoms have been stable. His first smoke is from 8 to 10 AM. He smokes 1 pack of cigarettes per day. Compliance with prior treatments has been poor.  Hyperlipidemia This is a recurrent problem. The current episode started more than 1 year ago. The problem is uncontrolled. Recent lipid tests were reviewed and are high. He is currently on no antihyperlipidemic treatment (Quit taking simvastatin). There are no compliance problems.  Risk factors for coronary artery disease include dyslipidemia and male sex.      Review of Systems  All other systems reviewed and are negative.       Objective:   Physical Exam  Constitutional: He appears well-developed and well-nourished.  HENT:  Head: Normocephalic.  Right Ear: External ear normal.  Left Ear: External ear normal.  Mouth/Throat: Oropharynx is clear and moist.  Eyes: Conjunctivae and EOM are normal. Pupils are equal, round, and reactive to light.  Cardiovascular: Normal rate and regular rhythm.   Pulmonary/Chest: Effort normal and breath sounds normal.          Assessment & Plan:  Other and unspecified  hyperlipidemia - Plan: pravastatin (PRAVACHOL) 20 MG tablet  Occlusion and stenosis of carotid artery without mention of cerebral infarction, bilateral - Plan: pravastatin (PRAVACHOL) 20 MG tablet Continue ASA 325mg  po qd Follow up in 3 months and then repeat lipid panel and LFT.  Tobacco abuse - Discussed at length that he needs to quit smoking.

## 2012-12-04 ENCOUNTER — Emergency Department (HOSPITAL_COMMUNITY)
Admission: EM | Admit: 2012-12-04 | Discharge: 2012-12-04 | Discharge status: Against Medical Advice | Payer: Medicare Other | Attending: Emergency Medicine | Admitting: Emergency Medicine

## 2012-12-04 ENCOUNTER — Emergency Department (HOSPITAL_COMMUNITY): Payer: Medicare Other

## 2012-12-04 ENCOUNTER — Encounter (HOSPITAL_COMMUNITY): Payer: Self-pay | Admitting: Emergency Medicine

## 2012-12-04 DIAGNOSIS — Z8709 Personal history of other diseases of the respiratory system: Secondary | ICD-10-CM | POA: Insufficient documentation

## 2012-12-04 DIAGNOSIS — J4489 Other specified chronic obstructive pulmonary disease: Secondary | ICD-10-CM | POA: Insufficient documentation

## 2012-12-04 DIAGNOSIS — I2510 Atherosclerotic heart disease of native coronary artery without angina pectoris: Secondary | ICD-10-CM | POA: Insufficient documentation

## 2012-12-04 DIAGNOSIS — R4020 Unspecified coma: Secondary | ICD-10-CM

## 2012-12-04 DIAGNOSIS — Z7982 Long term (current) use of aspirin: Secondary | ICD-10-CM | POA: Insufficient documentation

## 2012-12-04 DIAGNOSIS — J449 Chronic obstructive pulmonary disease, unspecified: Secondary | ICD-10-CM | POA: Insufficient documentation

## 2012-12-04 DIAGNOSIS — Z8673 Personal history of transient ischemic attack (TIA), and cerebral infarction without residual deficits: Secondary | ICD-10-CM | POA: Insufficient documentation

## 2012-12-04 DIAGNOSIS — Z79899 Other long term (current) drug therapy: Secondary | ICD-10-CM | POA: Insufficient documentation

## 2012-12-04 DIAGNOSIS — F17200 Nicotine dependence, unspecified, uncomplicated: Secondary | ICD-10-CM | POA: Insufficient documentation

## 2012-12-04 DIAGNOSIS — I252 Old myocardial infarction: Secondary | ICD-10-CM | POA: Insufficient documentation

## 2012-12-04 DIAGNOSIS — R404 Transient alteration of awareness: Secondary | ICD-10-CM | POA: Insufficient documentation

## 2012-12-04 LAB — COMPREHENSIVE METABOLIC PANEL
ALT: 12 U/L (ref 0–53)
AST: 14 U/L (ref 0–37)
Albumin: 3.7 g/dL (ref 3.5–5.2)
Alkaline Phosphatase: 82 U/L (ref 39–117)
Chloride: 100 mEq/L (ref 96–112)
Potassium: 3.7 mEq/L (ref 3.5–5.1)
Sodium: 135 mEq/L (ref 135–145)
Total Bilirubin: 0.5 mg/dL (ref 0.3–1.2)

## 2012-12-04 LAB — CBC WITH DIFFERENTIAL/PLATELET
Basophils Absolute: 0 10*3/uL (ref 0.0–0.1)
Eosinophils Absolute: 0.2 10*3/uL (ref 0.0–0.7)
Eosinophils Relative: 2 % (ref 0–5)
HCT: 47 % (ref 39.0–52.0)
MCH: 30.3 pg (ref 26.0–34.0)
MCV: 88 fL (ref 78.0–100.0)
Monocytes Absolute: 0.8 10*3/uL (ref 0.1–1.0)
Platelets: 216 10*3/uL (ref 150–400)
RDW: 13.5 % (ref 11.5–15.5)

## 2012-12-04 LAB — TROPONIN I: Troponin I: <0.3 ng/mL (ref <0.30)

## 2012-12-04 MED ORDER — SODIUM CHLORIDE 0.9 % IV BOLUS (SEPSIS)
1000.0000 mL | Freq: Once | INTRAVENOUS | Status: AC
  Administered 2012-12-04: 1000 mL via INTRAVENOUS

## 2012-12-04 NOTE — ED Provider Notes (Signed)
CSN: [id number]     Arrival date & time 12/04/12  1316 History  This chart was scribed for Donnetta Hutching, MD, by Yevette Edwards, ED Scribe. This patient was seen in room [facility] and the patient's care was started at 1:20 PM.  None    Chief Complaint  Patient presents with  . Loss of Consciousness   LEVEL 5 CAVEAT (nonverbal responses)  The history is provided by the spouse and the EMS personnel. No language interpreter was used.   HPI Comments: Walter [name] is a 62 y.o. male, brought in by EMS, who presents to the Emergency Department complaining of an acute LOC which occurred today after the pt attended the death of his mother. The pt did not answer questions directly, but he has responded non-verbally to voices,  especially to his wife.  Per the pt's wife, the pt has not eaten in the past four days. She reports that he has not experienced any increased physical exertion recently. He has a h/o of strokes, a MI, and emphysema. Nothing makes symptoms better or worse.  Past Medical History  Diagnosis Date  . Stroke 1997  . COPD (chronic obstructive pulmonary disease)   . Coronary artery disease   . Myocardial infarction     incidental  . Brain TIA     recurrent   . Carotid bruit    Past Surgical History  Procedure Laterality Date  . Back surgery    . Knee surgery     Family History  Problem Relation Age of Onset  . Cancer Father 13    stomach  . Stroke Neg Hx   . Coronary artery disease Mother 32  . Coronary artery disease Brother 48    AMI deceased   History  Substance Use Topics  . Smoking status: Current Every Day Smoker -- 1.30 packs/day for 17 years  . Smokeless tobacco: Current User    Types: Chew  . Alcohol Use: No    Review of Systems  Unable to perform ROS: Patient nonverbal  Psychiatric/Behavioral: The patient is not nervous/anxious.     Allergies  Review of patient's allergies indicates no known allergies.  Home Medications   Current Outpatient Rx   Name  Route  Sig  Dispense  Refill  . aspirin 325 MG tablet   Oral   Take 325 mg by mouth once as needed. For pain         . pravastatin (PRAVACHOL) 20 MG tablet   Oral   Take 1 tablet (20 mg total) by mouth daily.   90 tablet   1    Triage Vitals: BP 132/74  Pulse 77  Temp(Src) 98.7 F (37.1 C) (Oral)  Resp 20  Ht 6' (1.829 m)  Wt 175 lb (79.379 kg)  BMI 23.73 kg/m2  SpO2 98%  Physical Exam  Nursing note and vitals reviewed. Constitutional: He appears well-developed and well-nourished.  HENT:  Head: Normocephalic and atraumatic.  Eyes: Conjunctivae and EOM are normal. Pupils are equal, round, and reactive to light.  Neck: Normal range of motion. Neck supple.  Cardiovascular: Normal rate, regular rhythm and normal heart sounds.   Pulmonary/Chest: Effort normal and breath sounds normal.  Abdominal: Soft. Bowel sounds are normal.  Musculoskeletal: Normal range of motion.  Skin: Skin is warm and dry.    ED Course  Procedures (including critical care time)  DIAGNOSTIC STUDIES: Oxygen Saturation is 98% on room air, normal by my interpretation.    COORDINATION OF CARE:  1:25  PM-Discussed treatment plan with patient which includes IV fluids, lab work, and a CT scan, and the patient's wife agreed to the plan.   Labs Review Labs Reviewed  COMPREHENSIVE METABOLIC PANEL - Abnormal; Notable for the following:    Glucose, Bld 104 (*)    GFR calc non Af Amer 87 (*)    All other components within normal limits  CBC WITH DIFFERENTIAL  TROPONIN I  URINALYSIS, ROUTINE W REFLEX MICROSCOPIC   Imaging Review Ct Head Wo Contrast  12/04/2012   *RADIOLOGY REPORT*  Clinical Data: Syncope.  CT HEAD WITHOUT CONTRAST  Technique:  Contiguous axial images were obtained from the base of the skull through the vertex without contrast.  Comparison: Head CT scan 02/17/2012 and brain MRI 02/18/2012.  Findings: The patient has fairly extensive chronic microvascular ischemic change.  Remote  basal ganglia lacunar infarctions are again seen.  There is no evidence of acute abnormality including infarct, hemorrhage, mass lesion, mass effect, midline shift or abnormal extra-axial fluid.  There is some scattered ethmoid air cell disease.  Calvarium intact.  IMPRESSION:  1.  No acute finding. 2.  Extensive chronic microvascular ischemic change.  Remote bilateral basal ganglia lacunar infarcts again seen.   Original Report Authenticated By: Holley Dexter, M.D.   Date: 12/04/2012  Rate: 71  Rhythm: normal sinus rhythm  QRS Axis: left  Intervals: normal  ST/T Wave abnormalities: normal  Conduction Disutrbances: none  Narrative Interpretation: unremarkable     MDM  No diagnosis found. As time passed the emergency department, the patient became more lucid. He was ambulatory and talking without confusion.  I recommended the stat in hospital secondary to concerns of a stroke. He refuses to stay.  Is not psychotic. He is not neurologically impaired. Patient is to be discharged AMA   I personally performed the services described in this documentation, which was scribed in my presence. The recorded information has been reviewed and is accurate.     Donnetta Hutching, MD 12/04/12 [phone]

## 2012-12-04 NOTE — ED Notes (Signed)
Patient brought to Er via EMS. Patient lethargic but arousable. Airway patent. Patient brought in for syncopal episodes x3, first being at 1000 this morning. Patient nonverbal, follows commands. Patient has hx of MI and CVAs. Patient denies any pain. Patient shakes his head that he can understand but that he can not talk. Patient at funeral this morning for mother when first syncopal episode occurred. Per family patient has not eaten in 4 days. Per EMS CBG 100. Dr Adriana Simas aware, in room to assess patient.

## 2012-12-05 ENCOUNTER — Encounter (HOSPITAL_COMMUNITY): Payer: Self-pay | Admitting: Radiology

## 2012-12-05 ENCOUNTER — Emergency Department (HOSPITAL_COMMUNITY): Payer: Medicare Other

## 2012-12-05 ENCOUNTER — Emergency Department (HOSPITAL_COMMUNITY)
Admission: EM | Admit: 2012-12-05 | Discharge: 2012-12-05 | Disposition: A | Discharge status: Home / Self Care | Payer: Medicare Other | Attending: Emergency Medicine | Admitting: Emergency Medicine

## 2012-12-05 DIAGNOSIS — I2510 Atherosclerotic heart disease of native coronary artery without angina pectoris: Secondary | ICD-10-CM | POA: Insufficient documentation

## 2012-12-05 DIAGNOSIS — F4321 Adjustment disorder with depressed mood: Secondary | ICD-10-CM | POA: Insufficient documentation

## 2012-12-05 DIAGNOSIS — I252 Old myocardial infarction: Secondary | ICD-10-CM | POA: Insufficient documentation

## 2012-12-05 DIAGNOSIS — R404 Transient alteration of awareness: Secondary | ICD-10-CM | POA: Insufficient documentation

## 2012-12-05 DIAGNOSIS — R4182 Altered mental status, unspecified: Secondary | ICD-10-CM

## 2012-12-05 DIAGNOSIS — J4489 Other specified chronic obstructive pulmonary disease: Secondary | ICD-10-CM | POA: Insufficient documentation

## 2012-12-05 DIAGNOSIS — R55 Syncope and collapse: Secondary | ICD-10-CM | POA: Insufficient documentation

## 2012-12-05 DIAGNOSIS — R4701 Aphasia: Secondary | ICD-10-CM | POA: Insufficient documentation

## 2012-12-05 DIAGNOSIS — Z8673 Personal history of transient ischemic attack (TIA), and cerebral infarction without residual deficits: Secondary | ICD-10-CM | POA: Insufficient documentation

## 2012-12-05 DIAGNOSIS — F17200 Nicotine dependence, unspecified, uncomplicated: Secondary | ICD-10-CM | POA: Insufficient documentation

## 2012-12-05 DIAGNOSIS — J449 Chronic obstructive pulmonary disease, unspecified: Secondary | ICD-10-CM | POA: Insufficient documentation

## 2012-12-05 LAB — COMPREHENSIVE METABOLIC PANEL
AST: 14 U/L (ref 0–37)
Albumin: 3.4 g/dL — ABNORMAL LOW (ref 3.5–5.2)
Alkaline Phosphatase: 73 U/L (ref 39–117)
CO2: 20 mEq/L (ref 19–32)
Chloride: 105 mEq/L (ref 96–112)
Creatinine, Ser: 0.78 mg/dL (ref 0.50–1.35)
GFR calc non Af Amer: >90 mL/min (ref >90)
Potassium: 3.6 mEq/L (ref 3.5–5.1)
Total Bilirubin: 0.3 mg/dL (ref 0.3–1.2)

## 2012-12-05 LAB — CBC
HCT: 44.3 % (ref 39.0–52.0)
Hemoglobin: 15.8 g/dL (ref 13.0–17.0)
MCH: 30.9 pg (ref 26.0–34.0)
MCHC: 35.7 g/dL (ref 30.0–36.0)
MCV: 86.5 fL (ref 78.0–100.0)
Platelets: 218 K/uL (ref 150–400)
RBC: 5.12 MIL/uL (ref 4.22–5.81)
RDW: 13.5 % (ref 11.5–15.5)
WBC: 8.5 K/uL (ref 4.0–10.5)

## 2012-12-05 LAB — DIFFERENTIAL
Basophils Absolute: 0 K/uL (ref 0.0–0.1)
Basophils Relative: 0 % (ref 0–1)
Eosinophils Absolute: 0.2 K/uL (ref 0.0–0.7)
Eosinophils Relative: 2 % (ref 0–5)
Lymphocytes Relative: 32 % (ref 12–46)
Lymphs Abs: 2.7 K/uL (ref 0.7–4.0)
Monocytes Absolute: 0.7 K/uL (ref 0.1–1.0)
Monocytes Relative: 8 % (ref 3–12)
Neutro Abs: 4.9 K/uL (ref 1.7–7.7)
Neutrophils Relative %: 58 % (ref 43–77)

## 2012-12-05 LAB — POCT I-STAT, CHEM 8
BUN: 7 mg/dL (ref 6–23)
Calcium, Ion: 1.12 mmol/L — ABNORMAL LOW (ref 1.13–1.30)
Chloride: 105 meq/L (ref 96–112)
Creatinine, Ser: 0.8 mg/dL (ref 0.50–1.35)
Glucose, Bld: 87 mg/dL (ref 70–99)
HCT: 47 % (ref 39.0–52.0)
Hemoglobin: 16 g/dL (ref 13.0–17.0)
Potassium: 3.6 meq/L (ref 3.5–5.1)
Sodium: 139 meq/L (ref 135–145)
TCO2: 21 mmol/L (ref 0–100)

## 2012-12-05 LAB — GLUCOSE, CAPILLARY: Glucose-Capillary: 82 mg/dL (ref 70–99)

## 2012-12-05 LAB — APTT: aPTT: 36 seconds (ref 24–37)

## 2012-12-05 NOTE — ED Notes (Signed)
Pt was sitting in front seat of auto heading to [place]emetary, attending mother's funeral.  Pt experienced sudden unresponsive episode.  Wife called ambulance.  Similar episode yesterday, was seen at [facility] ED, elected not to stay because it was his mother's funeral today.

## 2012-12-05 NOTE — Code Documentation (Addendum)
62 year old male presented to [facility] as code stroke.   Code stroke was called in the field at 1703 with ETA 15 mins.  Patient arrived at 89.  LSW 1545.  EDP examined at bridge at 63.  Labs drawn at bridge at 1727.  Stroke team arrived at 80.  Patient arrived in CT at 21.  On arrival patient was arousable - speech slurred but appropriate - weakness noted bil legs - left arm drift slurred speech - and left side sensation decreased.  Patient was at mothers funeral today with onset of sx at 27.  Patient was in [facility] ED yesterday with blurred vision - left AMA without full workup.  [facility] 7.  Dr. Amada Jupiter at bedside. To MRI for DWI - negative for acute stroke.  Code stroke cancelled at 1806 per Dr. Amada Jupiter.  Handoff to [employer].

## 2012-12-05 NOTE — Consult Note (Signed)
Neurology Consultation Reason for Consult: Change in mental status Referring Physician: Sarina Ser  CC: LOC  History is obtained from:Wife, patient  HPI: Walter [name] is a 62 y.o. male with a history of "mini-strokes" in the past where he will have slurred speech over the course of a day, usually associated with stress. His mother died recently and he has not been eating and then yesterday while at her visitation, he had an episode where he was unresponsive. This lasted for approximately 45 minutes and following he gradually returned to an improved state, though continued to have blurred vision(per patient) and dysarthria(per wife). He then was going to the [place] today and had another prolonged episode of unresponsiveness. No jerking seen during episodes of unresponnisveness. On EMS arrival, it was reported initally, right sided weakness, then left. On evaluation here, he initially let his right arm drift, then endorsed left sensatino decrease, then this resolved.   Per his wife, he has not eaten in the past few days. He was very close with his mother and has not been handling her death well.    LKW: 12-23-2022 early afternoon tpa given: no, out of window, not a stroke.    ROS: A 14 point ROS was performed and is negative except as noted in the HPI.  Past Medical History  Diagnosis Date  . Stroke 1997  . COPD (chronic obstructive pulmonary disease)   . Coronary artery disease   . Myocardial infarction     incidental  . Brain TIA     recurrent   . Carotid bruit     Family History: Mother - CVA  Social History: Tob: current smoker  Exam: Current vital signs: BP 126/62  Pulse 72  Resp 25  SpO2 96% Vital signs in last 24 hours: Pulse Rate:  [72] 72 (09/03 1830) Resp:  [18-25] 25 (09/03 1830) BP: (126-150)/(62-74) 126/62 mmHg (09/03 1830) SpO2:  [96 %] 96 % (09/03 1830)  General: in bed, NAD CV: RRR Mental Status: Patient is initially drowsy, but then awake, alert,  oriented to person, place, month, year, and situation. No signs of aphasia or neglect He follows commands immediately. He does have some dysarthria which appears to clear at times.  Cranial Nerves: II: Visual Fields are full. Pupils are equal, round, and reactive to light.  Discs are difficult to visualize. III,IV, VI: EOMI without ptosis or diploplia.  V: Facial sensation is symmetric to temperature VII: Facial movement is symmetric.  VIII: hearing is intact to voice X: Uvula elevates symmetrically XI: Shoulder shrug is symmetric. XII: tongue is midline without atrophy or fasciculations.  Motor: Tone is normal. Bulk is normal. 5/5 strength was present in all four extremities after much prompting and encouragement, has give way weakness at times.  Sensory: Sensation is symmetric to pin, though decreased initially.  Deep Tendon Reflexes: 2+ and symmetric in the biceps and patellae.  Cerebellar: FNF intact bilaterally, mild difficulty on left HKS Gait: Not assessed due to acute nature of evaluation and multiple medical monitors in ED setting.   I have reviewed labs in epic and the results pertinent to this consultation are: CMP low albumin Cbc nml  I have reviewed the images obtained:CT head - negative acute. MRI brain no DWI changes.   Impression: 62 yo M with blurred vision and dysarthria since episode of unresponsive ness yesterday. Inconsistencies on exam make me strongly suspicious for a non-organic etiology to the two episodes yesterday and today. With persistent symptoms > 24 hours  and normal MRI, I feel that ischemia is unlikely to be related to his current presentation. At this time, with no history suggestive of seizure, would not recommend AED empirically. If he were to continue to have spells now that the funeral is complete, then EEG would be indicated.   Recommendations: 1) EEG if spells continue, very low suspicion for seizure currently.  2) No AEDs at this time.  Please call if there are any further questions or concerns.    Ritta Slot, MD [facility] [phone]  If 7pm- 7am, please page neurology on call at [phone].

## 2012-12-05 NOTE — ED Provider Notes (Addendum)
CSN: [id number]     Arrival date & time 12/05/12  1728 History   First MD Initiated Contact with Patient 12/05/12 1737     No chief complaint on file.  (Consider location/radiation/quality/duration/timing/severity/associated sxs/prior Treatment) HPI Comments: History provided by patient's wife.  62 year old male presenting after a syncopal episode and subsequent aphasia.  Patient is a 62 y.o. male presenting with neurologic complaint.  Neurologic Problem This is a recurrent problem. The current episode started less than 1 hour ago. The problem occurs constantly. The problem has been gradually improving. Pertinent negatives include no chest pain, no abdominal pain and no shortness of breath. Associated symptoms comments: Loss of consciousness. Nothing aggravates the symptoms. Nothing relieves the symptoms.    Past Medical History  Diagnosis Date  . Stroke 1997  . COPD (chronic obstructive pulmonary disease)   . Coronary artery disease   . Myocardial infarction     incidental  . Brain TIA     recurrent   . Carotid bruit    Past Surgical History  Procedure Laterality Date  . Back surgery    . Knee surgery     Family History  Problem Relation Age of Onset  . Cancer Father 22    stomach  . Coronary artery disease Mother 31  . Stroke Mother   . Coronary artery disease Brother 95    AMI deceased   History  Substance Use Topics  . Smoking status: Current Every Day Smoker -- 1.30 packs/day for 17 years    Types: Cigarettes  . Smokeless tobacco: Never Used  . Alcohol Use: No    Review of Systems  Constitutional: Negative for fever.  HENT: Negative for congestion.   Respiratory: Negative for cough and shortness of breath.   Cardiovascular: Negative for chest pain.  Gastrointestinal: Negative for nausea, vomiting, abdominal pain and diarrhea.  All other systems reviewed and are negative.    Allergies  Review of patient's allergies indicates no known allergies.  Home  Medications   Current Outpatient Rx  Name  Route  Sig  Dispense  Refill  . aspirin 325 MG tablet   Oral   Take 325 mg by mouth once as needed. For pain          There were no vitals taken for this visit. Physical Exam  Nursing note and vitals reviewed. Constitutional: He is oriented to person, place, and time. He appears well-developed and well-nourished. No distress.  HENT:  Head: Normocephalic and atraumatic.  Mouth/Throat: Oropharynx is clear and moist.  Eyes: Conjunctivae are normal. Pupils are equal, round, and reactive to light. No scleral icterus.  Neck: Neck supple.  Cardiovascular: Normal rate, regular rhythm, normal heart sounds and intact distal pulses.   No murmur heard. Pulmonary/Chest: Effort normal and breath sounds normal. No stridor. No respiratory distress. He has no wheezes. He has no rales.  Abdominal: Soft. He exhibits no distension. There is no tenderness.  Musculoskeletal: Normal range of motion. He exhibits no edema.  Neurological: He is alert and oriented to person, place, and time. He has normal strength. No cranial nerve deficit or sensory deficit. Coordination and gait normal. GCS eye subscore is 4. GCS verbal subscore is 5. GCS motor subscore is 6.  Skin: Skin is warm and dry. No rash noted.  Psychiatric: He has a normal mood and affect. His behavior is normal.    ED Course  Procedures (including critical care time) Labs Review Labs Reviewed  COMPREHENSIVE METABOLIC PANEL - Abnormal; Notable for  the following:    Albumin 3.4 (*)    All other components within normal limits  POCT I-STAT, CHEM 8 - Abnormal; Notable for the following:    Calcium, Ion 1.12 (*)    All other components within normal limits  ETHANOL  PROTIME-INR  APTT  CBC  DIFFERENTIAL  TROPONIN I  GLUCOSE, CAPILLARY  URINE RAPID DRUG SCREEN (HOSP PERFORMED)  URINALYSIS, ROUTINE W REFLEX MICROSCOPIC  POCT I-STAT TROPONIN I   Imaging Review Ct Head Wo Contrast  12/05/2012    CLINICAL DATA:  62 year old male code stroke. Right-sided weakness.  EXAM: CT HEAD WITHOUT CONTRAST  TECHNIQUE: Contiguous axial images were obtained from the base of the skull through the vertex without intravenous contrast.  COMPARISON:  12/04/2012 and earlier.  FINDINGS: Stable paranasal sinuses. Mastoids are clear. No acute osseous abnormality identified. Visualized orbits and scalp soft tissues are within normal limits.  Calcified atherosclerosis at the skull base. Left MCA M1 segment calcified atherosclerosis re- identified. No evidence of cortically based acute infarction identified. Chronic basilar ganglia lacunar infarcts greater on the left appear stable Long with bilateral patchy white matter hypodensity. No suspicious intracranial vascular hyperdensity. No midline shift, mass effect, or evidence of intracranial mass lesion. No acute intracranial hemorrhage identified.  IMPRESSION: Stable. No evidence of cortically based acute infarct. No intracranial hemorrhage or mass effect. Chronic small vessel ischemia.  CriticalValue/emergent results were called by telephone at the time of interpretation on 12/05/2012 at 5:40 PMto Dr. Amada Jupiter, who verbally acknowledged these results.   Electronically Signed   By: Augusto Gamble   On: 12/05/2012 17:45   Ct Head Wo Contrast  12/04/2012   *RADIOLOGY REPORT*  Clinical Data: Syncope.  CT HEAD WITHOUT CONTRAST  Technique:  Contiguous axial images were obtained from the base of the skull through the vertex without contrast.  Comparison: Head CT scan 02/17/2012 and brain MRI 02/18/2012.  Findings: The patient has fairly extensive chronic microvascular ischemic change.  Remote basal ganglia lacunar infarctions are again seen.  There is no evidence of acute abnormality including infarct, hemorrhage, mass lesion, mass effect, midline shift or abnormal extra-axial fluid.  There is some scattered ethmoid air cell disease.  Calvarium intact.  IMPRESSION:  1.  No acute finding. 2.   Extensive chronic microvascular ischemic change.  Remote bilateral basal ganglia lacunar infarcts again seen.   Original Report Authenticated By: Holley Dexter, M.D.  Today's CT studies independently viewed by me.     EKG - Sinus rhythm, rate 70, LAD, no ST/T changes, similar to yesterday's EKG.    MDM   1. Grief reaction   2. Syncope    62 year old male who had loss of consciousness today on his way to his mothers funeral. He subsequently developed bespeaking. Was initially brought to the ED as a code stroke. Initially evaluated by Dr. Amada Jupiter, who noted a inconsistent neurologic exam. CT of his head and MRI of his brain negative for acute stroke.  He recommended no further neurologic workup.   Pt had a similar episode yesterday, and was evaluated at [facility]. He left the emergency department AMA instead of being evaluated for a stroke workup. His wife reports a syncopal episode at that time as well. At that time he was at his mother's visitation.  On my exam, he had no neurologic deficits. His syncopal episode was likely a grief reaction. However we did discuss the possibility of cardiogenic syncope. He declined admission to the hospital for further monitoring, and understood the  risks of leaving.  Candyce Churn, MD 12/05/12 2014  Candyce Churn, MD 12/05/12 2014

## 2012-12-05 NOTE — ED Notes (Signed)
Dr Amada Jupiter verbalized "cancel code stroke" while pt completing MRI.

## 2012-12-10 ENCOUNTER — Ambulatory Visit: Payer: Medicare Other | Admitting: Family Medicine

## 2013-02-07 ENCOUNTER — Telehealth: Payer: Self-pay | Admitting: Family Medicine

## 2013-02-07 NOTE — Telephone Encounter (Signed)
Up front 

## 2014-01-12 ENCOUNTER — Emergency Department (HOSPITAL_COMMUNITY)
Admission: EM | Admit: 2014-01-12 | Discharge: 2014-01-12 | Disposition: A | Discharge status: Home / Self Care | Payer: Medicare Other | Attending: Emergency Medicine | Admitting: Emergency Medicine

## 2014-01-12 ENCOUNTER — Emergency Department (HOSPITAL_COMMUNITY): Payer: Medicare Other

## 2014-01-12 ENCOUNTER — Encounter (HOSPITAL_COMMUNITY): Payer: Self-pay | Admitting: Emergency Medicine

## 2014-01-12 DIAGNOSIS — Z791 Long term (current) use of non-steroidal anti-inflammatories (NSAID): Secondary | ICD-10-CM | POA: Insufficient documentation

## 2014-01-12 DIAGNOSIS — Z79899 Other long term (current) drug therapy: Secondary | ICD-10-CM | POA: Diagnosis not present

## 2014-01-12 DIAGNOSIS — K1120 Sialoadenitis, unspecified: Secondary | ICD-10-CM | POA: Insufficient documentation

## 2014-01-12 DIAGNOSIS — Z8673 Personal history of transient ischemic attack (TIA), and cerebral infarction without residual deficits: Secondary | ICD-10-CM | POA: Insufficient documentation

## 2014-01-12 DIAGNOSIS — I2510 Atherosclerotic heart disease of native coronary artery without angina pectoris: Secondary | ICD-10-CM | POA: Insufficient documentation

## 2014-01-12 DIAGNOSIS — J449 Chronic obstructive pulmonary disease, unspecified: Secondary | ICD-10-CM | POA: Diagnosis not present

## 2014-01-12 DIAGNOSIS — Z720 Tobacco use: Secondary | ICD-10-CM | POA: Diagnosis not present

## 2014-01-12 DIAGNOSIS — Z792 Long term (current) use of antibiotics: Secondary | ICD-10-CM | POA: Insufficient documentation

## 2014-01-12 DIAGNOSIS — Z7982 Long term (current) use of aspirin: Secondary | ICD-10-CM | POA: Insufficient documentation

## 2014-01-12 DIAGNOSIS — I252 Old myocardial infarction: Secondary | ICD-10-CM | POA: Diagnosis not present

## 2014-01-12 DIAGNOSIS — R079 Chest pain, unspecified: Secondary | ICD-10-CM | POA: Diagnosis present

## 2014-01-12 LAB — BASIC METABOLIC PANEL
Anion gap: 13 (ref 5–15)
BUN: 11 mg/dL (ref 6–23)
CO2: 21 mEq/L (ref 19–32)
Calcium: 9.1 mg/dL (ref 8.4–10.5)
Chloride: 104 mEq/L (ref 96–112)
Creatinine, Ser: 0.82 mg/dL (ref 0.50–1.35)
GFR calc Af Amer: >90 mL/min (ref >90)
GFR calc non Af Amer: >90 mL/min (ref >90)
Glucose, Bld: 96 mg/dL (ref 70–99)
Potassium: 3.8 mEq/L (ref 3.7–5.3)
Sodium: 138 mEq/L (ref 137–147)

## 2014-01-12 LAB — CBC WITH DIFFERENTIAL/PLATELET
Basophils Absolute: 0 10*3/uL (ref 0.0–0.1)
Basophils Relative: 0 % (ref 0–1)
Eosinophils Absolute: 0.2 10*3/uL (ref 0.0–0.7)
Eosinophils Relative: 2 % (ref 0–5)
HCT: 47.2 % (ref 39.0–52.0)
Hemoglobin: 16.1 g/dL (ref 13.0–17.0)
Lymphocytes Relative: 17 % (ref 12–46)
Lymphs Abs: 1.9 10*3/uL (ref 0.7–4.0)
MCH: 30.2 pg (ref 26.0–34.0)
MCHC: 34.1 g/dL (ref 30.0–36.0)
MCV: 88.6 fL (ref 78.0–100.0)
Monocytes Absolute: 1.3 10*3/uL — ABNORMAL HIGH (ref 0.1–1.0)
Monocytes Relative: 11 % (ref 3–12)
Neutro Abs: 7.8 10*3/uL — ABNORMAL HIGH (ref 1.7–7.7)
Neutrophils Relative %: 70 % (ref 43–77)
Platelets: 187 10*3/uL (ref 150–400)
RBC: 5.33 MIL/uL (ref 4.22–5.81)
RDW: 13.4 % (ref 11.5–15.5)
WBC: 11.2 10*3/uL — ABNORMAL HIGH (ref 4.0–10.5)

## 2014-01-12 LAB — TROPONIN I: Troponin I: <0.3 ng/mL (ref <0.30)

## 2014-01-12 MED ORDER — CLINDAMYCIN HCL 150 MG PO CAPS: 300.0000 mg | ORAL_CAPSULE | Freq: Three times a day (TID) | ORAL | Status: DC

## 2014-01-12 MED ORDER — OXYCODONE-ACETAMINOPHEN 5-325 MG PO TABS: 1.0000 | ORAL_TABLET | ORAL | Status: DC | PRN

## 2014-01-12 MED ORDER — MORPHINE SULFATE 4 MG/ML IJ SOLN
4.0000 mg | Freq: Once | INTRAMUSCULAR | Status: AC
  Administered 2014-01-12: 4 mg via INTRAVENOUS
  Filled 2014-01-12: qty 1

## 2014-01-12 MED ORDER — ONDANSETRON HCL 4 MG/2ML IJ SOLN
4.0000 mg | Freq: Once | INTRAMUSCULAR | Status: AC
  Administered 2014-01-12: 4 mg via INTRAVENOUS
  Filled 2014-01-12: qty 2

## 2014-01-12 MED ORDER — DIAZEPAM 5 MG/ML IJ SOLN: 2.0000 mg | Freq: Once | INTRAMUSCULAR | Status: DC

## 2014-01-12 MED ORDER — IOHEXOL 300 MG/ML  SOLN
75.0000 mL | Freq: Once | INTRAMUSCULAR | Status: AC | PRN
  Administered 2014-01-12: 75 mL via INTRAVENOUS

## 2014-01-12 MED ORDER — FENTANYL CITRATE 0.05 MG/ML IJ SOLN: 50.0000 ug | Freq: Once | INTRAMUSCULAR | Status: DC

## 2014-01-12 MED ORDER — SODIUM CHLORIDE 0.9 % IV BOLUS (SEPSIS)
1000.0000 mL | Freq: Once | INTRAVENOUS | Status: AC
  Administered 2014-01-12: 1000 mL via INTRAVENOUS

## 2014-01-12 MED ORDER — CLINDAMYCIN PHOSPHATE 600 MG/50ML IV SOLN
600.0000 mg | Freq: Once | INTRAVENOUS | Status: AC
  Administered 2014-01-12: 600 mg via INTRAVENOUS
  Filled 2014-01-12: qty 50

## 2014-01-12 MED ORDER — HYDROMORPHONE HCL 1 MG/ML IJ SOLN
1.0000 mg | Freq: Once | INTRAMUSCULAR | Status: AC
  Administered 2014-01-12: 1 mg via INTRAVENOUS
  Filled 2014-01-12: qty 1

## 2014-01-12 MED ORDER — KETOROLAC TROMETHAMINE 15 MG/ML IJ SOLN
15.0000 mg | Freq: Once | INTRAMUSCULAR | Status: AC
  Administered 2014-01-12: 15 mg via INTRAVENOUS
  Filled 2014-01-12: qty 1

## 2014-01-12 NOTE — ED Notes (Signed)
Patient transported to CT 

## 2014-01-12 NOTE — ED Notes (Signed)
Pt in via [facility] from [facility], per report pt was seen for L arm pain onset x 2 days, pt c/o R submandibular soft tissue mass, pt A&O x4, follows commands, speaks in complete sentences, 3/10 R neck, denies slurred speech, gait changes, no facial droop

## 2014-01-12 NOTE — ED Notes (Signed)
MD at bedside. 

## 2014-01-12 NOTE — ED Notes (Signed)
Verified with MD that blood cultures were not needed prior to starting antibiotics

## 2014-01-12 NOTE — ED Provider Notes (Signed)
CSN: [id number]     Arrival date & time 01/12/14  1807 History   First MD Initiated Contact with Patient 01/12/14 1808     Chief Complaint  Patient presents with  . Chest Pain     (Consider location/radiation/quality/duration/timing/severity/associated sxs/prior Treatment) HPI  63 year old male with right face/right neck pain and swelling. Gradual onset about 3 days ago. Progressively worsening. No fevers or chills. No change in voice. No dysphagia. No shortness of breath. No history similar symptoms. Denies any trauma. Is a smoker. Does not chew tobacco. No significant alcohol history.  Past Medical History  Diagnosis Date  . Stroke 1997  . COPD (chronic obstructive pulmonary disease)   . Coronary artery disease   . Myocardial infarction     incidental  . Brain TIA     recurrent   . Carotid bruit    Past Surgical History  Procedure Laterality Date  . Back surgery    . Knee surgery     Family History  Problem Relation Age of Onset  . Cancer Father 44    stomach  . Coronary artery disease Mother 22  . Stroke Mother   . Coronary artery disease Brother 53    AMI deceased   History  Substance Use Topics  . Smoking status: Current Every Day Smoker -- 1.30 packs/day for 17 years    Types: Cigarettes  . Smokeless tobacco: Never Used  . Alcohol Use: No    Review of Systems  All systems reviewed and negative, other than as noted in HPI.   Allergies  Review of patient's allergies indicates no known allergies.  Home Medications   Prior to Admission medications   Medication Sig Start Date End Date Taking? Authorizing Provider  aspirin 325 MG tablet Take 325 mg by mouth daily.    Yes Historical Provider, MD  Difluprednate (DUREZOL) 0.05 % EMUL Place 1 drop into the left eye daily.   Yes Historical Provider, MD  gentamicin (GARAMYCIN) 0.3 % ophthalmic solution Place 1 drop into the left eye 4 (four) times daily.   Yes Historical Provider, MD  Nepafenac (ILEVRO) 0.3 %  SUSP Place 1 drop into the right eye daily.   Yes Historical Provider, MD  clindamycin (CLEOCIN) 150 MG capsule Take 2 capsules (300 mg total) by mouth 3 (three) times daily. 01/12/14   Virgel Manifold, MD  oxyCODONE-acetaminophen (PERCOCET/ROXICET) 5-325 MG per tablet Take 1-2 tablets by mouth every 4 (four) hours as needed for severe pain. 01/12/14   Virgel Manifold, MD   BP 138/66  Pulse 66  Temp(Src) 99.4 F (37.4 C) (Oral)  Resp 17  SpO2 94% Physical Exam  Nursing note and vitals reviewed. Constitutional: He appears well-developed and well-nourished. No distress.  HENT:  Head: Normocephalic and atraumatic.  TTP, swelling under body of R mandible. Small amount of purulence at R sublingual papilla. Posterior pharynx is clear. Uvula midline. Normal sounding phonation. No stridor.  Eyes: Conjunctivae are normal. Right eye exhibits no discharge. Left eye exhibits no discharge.  Neck: Neck supple.  Cardiovascular: Normal rate, regular rhythm and normal heart sounds.  Exam reveals no gallop and no friction rub.   No murmur heard. Pulmonary/Chest: Effort normal and breath sounds normal. No respiratory distress.  Abdominal: Soft. He exhibits no distension. There is no tenderness.  Musculoskeletal: He exhibits no edema and no tenderness.  Neurological: He is alert.  Skin: Skin is warm and dry.  Psychiatric: He has a normal mood and affect. His behavior is normal. Thought  content normal.    ED Course  Procedures (including critical care time) Labs Review Labs Reviewed  CBC WITH DIFFERENTIAL - Abnormal; Notable for the following:    WBC 11.2 (*)    Neutro Abs 7.8 (*)    Monocytes Absolute 1.3 (*)    All other components within normal limits  BASIC METABOLIC PANEL  TROPONIN I    Imaging Review Dg Chest 2 View  01/12/2014   CLINICAL DATA:  Chest pain for 2 days  EXAM: CHEST  2 VIEW  COMPARISON:  February 18, 2012  FINDINGS: The heart size and mediastinal contours are within normal  limits. There is no focal infiltrate, pulmonary edema, or pleural effusion. The visualized skeletal structures are stable.  IMPRESSION: No active cardiopulmonary disease.   Electronically Signed   By: Abelardo Diesel M.D.   On: 01/12/2014 20:29   Ct Soft Tissue Neck W Contrast  01/12/2014   CLINICAL DATA:  Right submandibular mass with pain and fever. Initial encounter.  EXAM: CT NECK WITH CONTRAST  TECHNIQUE: Multidetector CT imaging of the neck was performed using the standard protocol following the bolus administration of intravenous contrast.  CONTRAST:  29mL OMNIPAQUE IOHEXOL 300 MG/ML  SOLN  COMPARISON:  None.  FINDINGS: The right submandibular gland is enlarged and edematous with surrounding fat infiltration. The submandibular duct is enlarged secondary to 3 calculi in the distal duct. The largest stone (located at the punctum) measures 6 mm. The most proximal stone is within 1.5 cm of the ductal orifice. Small calculi are seen in the bilateral parotid tails, without active inflammation. No abscess or ranula.  Prominent palatine and lingual tonsillar tissue, with a presumed inclusion cyst at the level of the central palatine tonsil. No asymmetric thickening or enhancement suggestive of superimposed mass lesion. The thyroid gland is unremarkable. There is mild enlargement of right cervical chain nodes, presumably reactive. Patchy bilateral ethmoid sinus inflammatory mucosal thickening.  In the midline posterior neck is a subcutaneous cystic mass, 21 mm in diameter, consistent with dermal inclusion cyst.  Confluent periventricular white matter low-attenuation is incidentally seen, with remote appearing lacunar infarct in the left lateral lenticulostriate distribution. Cervical carotid atherosclerosis without significant stenosis.  IMPRESSION: 1. Right submandibular sialoadenitis secondary to 3 calculi obstructing the distal duct. 2. Bilateral parotid silalolithiasis. 3. Palatine and lingual tonsillar  enlargement.   Electronically Signed   By: Jorje Guild M.D.   On: 01/12/2014 21:24     EKG Interpretation   Date/Time:  Sunday January 12 2014 18:13:51 EDT Ventricular Rate:  80 PR Interval:  179 QRS Duration: 96 QT Interval:  371 QTC Calculation: 428 R Axis:   -61 Text Interpretation:  Sinus rhythm Left anterior fascicular block  Non-specific ST-t changes Confirmed by Wilson Singer  MD, Verley Pariseau ([id number]) on  01/12/2014 8:18:26 PM      MDM   Final diagnoses:  Sialadenitis    63yM with R facial/neck pain and swelling. CT as above. I was able to remove the largest stone as this was right at the punctum and also a smaller stone just behind it. Unable to milk third stone to point where I could visualize/extract it. Stone at punctum was largest so hopefully last will be able to pass. Abx, pain meds, warm compresses, massage, sialogogues. Return precautions discussed. ENT follow-up as needed.     Virgel Manifold, MD 01/19/14 [phone]

## 2014-01-12 NOTE — Discharge Instructions (Signed)
Sialadenitis °Sialadenitis is an inflammation (soreness) of the salivary glands. The parotid is the main salivary gland. It lies behind the angle of the jaw below the ear. The saliva produced comes out of a tiny opening (duct) inside the cheek on either side. This is usually at the level of the upper back teeth. If it is swollen, the ear is pushed up and out. This helps tell this condition apart from a simple lymph gland infection (swollen glands) in the same area. Mumps has mostly disappeared since the start of immunization against mumps. Now the most common cause of parotitis is germ (bacterial) infection or inflammation of the lymphatics (the lymph channels). The other major salivary gland is located in the floor of the mouth. Smaller salivary glands are located in the mouth. This includes the: °· Lips. °· Lining of the mouth. °· Pharynx. °· Hard palate (front part of the roof of the mouth). °The salivary glands do many things, including: °· Lubrication. °· Breaking down food. °· Production of hormones and antibodies (to protect against germs which may cause illness). °· Help with the sense of taste. °ACUTE BACTERIAL SIALADENITIS °This is a sudden inflammatory response to bacterial infection. This causes redness, pain, swelling and tenderness over the infected gland. In the past, it was common in dehydrated and debilitated patients often following an operation. It is now more commonly seen: °· After radiotherapy. °· In patients with poor immune systems. °Treatment is: °· The correction of fluid balance (rehydration). °· Medicine that kill germs (antibiotics). °· Pain relief. °CHRONIC RECURRENT SIALADENITIS °This refers to repeated episodes of discomfort and swelling of one of the salivary glands. It often occurs after eating. Chronic sialadenitis is usually less painful. It is associated with recurrent enlargement of a salivary gland, often following meals, and typically with an absence of redness. The chronic  form of the disease often is associated with conditions linked to decreased salivary flow, rather than dehydration (loss of body fluids). These conditions include: °· A stone, or concretion, formed in the gallbladder, kidneys, or other parts of the body (calculi). °· Salivary stasis. °· A change in the fluid and electrolyte (the salts in your body fluids) makeup of the gland. °It is treated with: °· Gland massage. °· Methods to stimulate the flow of saliva, (for example, lemon juice). °· Antibiotics if required. °Surgery to remove the gland is possible, but its benefits need to be balanced against risks.  °VIRAL SIALADENITIS °Several viruses infect the salivary glands. Some of these include the mumps virus that commonly infects the parotid gland. Other viruses causing problems are: °· The HIV virus. °· Herpes. °· Some of the influenza ("flu") viruses. °RECURRENT SIALADENITIS IN CHILDREN °This condition is thought to be due to swelling or ballooning of the ducts. It results in the same symptoms as acute bacterial parotitis. It is usually caused by germs (bacteria). It is often treated using penicillin. It may get well without treatment. Surgery is usually not required. °TUBERCULOUS SIALADENITIS °The salivary glands may become infected with the same bacteria causing tuberculosis ("TB"). Treatment is with anti-tuberculous antibiotic therapy. °OTHER UNCOMMON CAUSES OF SIALADENITIS  °· Sjogren's syndrome is a condition in which arthritis is associated with a decrease in activity of the glands of the body that produce saliva and tears. The diagnosis is made with blood tests or by examination of a piece of tissue from the inside of the lip. Some people with this condition are bothered by: °¨ A dry mouth. °¨ Intermittent salivary gland   enlargement. °· Atypical mycobacteria is a germ similar to tuberculosis. It often infects children. It is often resistant to antibiotic treatment. It may require surgical treatment to remove  the infected salivary gland. °· Actinomycosis is an infection of the parotid gland that may also involve the overlying skin. The diagnosis is made by detecting granules of sulphur produced by the bacteria on microscopic examination. Treatment is a prolonged course of penicillin for up to one year. °· Nutritional causes include vitamin deficiencies and bulimia. °· Diabetes and problems with your thyroid. °· Obesity, cirrhosis, and malabsorption are some metabolic causes. °HOME CARE INSTRUCTIONS  °· Apply ice bags every 2 hours for 15-20 minutes, while awake, to the sore gland for 24 hours, then as directed by your caregiver. Place the ice in a plastic bag with a towel around it to prevent frostbite to the skin. °· Only take over-the-counter or prescription medicines for pain, discomfort, or fever as directed by your caregiver. °SEEK IMMEDIATE MEDICAL CARE IF:  °· There is increased pain or swelling in your gland that is not controlled with medicine. °· An oral temperature above 102° F (38.9° C) develops, not controlled by medicine. °· You develop difficulty opening your mouth, swallowing, or speaking. °Document Released: 09/10/2001 Document Revised: 06/13/2011 Document Reviewed: 11/05/2007 °[employer]® Patient Information ©2015 [employer]. This information is not intended to replace advice given to you by your health care provider. Make sure you discuss any questions you have with your health care provider. ° °

## 2014-01-30 ENCOUNTER — Emergency Department (HOSPITAL_COMMUNITY): Payer: Medicare Other

## 2014-01-30 ENCOUNTER — Emergency Department (HOSPITAL_COMMUNITY)
Admission: EM | Admit: 2014-01-30 | Discharge: 2014-01-30 | Disposition: A | Discharge status: Home / Self Care | Payer: Medicare Other | Attending: Emergency Medicine | Admitting: Emergency Medicine

## 2014-01-30 ENCOUNTER — Encounter (HOSPITAL_COMMUNITY): Payer: Self-pay | Admitting: Emergency Medicine

## 2014-01-30 DIAGNOSIS — A047 Enterocolitis due to Clostridium difficile: Secondary | ICD-10-CM | POA: Diagnosis not present

## 2014-01-30 DIAGNOSIS — I252 Old myocardial infarction: Secondary | ICD-10-CM | POA: Diagnosis not present

## 2014-01-30 DIAGNOSIS — R109 Unspecified abdominal pain: Secondary | ICD-10-CM | POA: Diagnosis not present

## 2014-01-30 DIAGNOSIS — I2510 Atherosclerotic heart disease of native coronary artery without angina pectoris: Secondary | ICD-10-CM | POA: Insufficient documentation

## 2014-01-30 DIAGNOSIS — J441 Chronic obstructive pulmonary disease with (acute) exacerbation: Secondary | ICD-10-CM | POA: Insufficient documentation

## 2014-01-30 DIAGNOSIS — A0472 Enterocolitis due to Clostridium difficile, not specified as recurrent: Secondary | ICD-10-CM

## 2014-01-30 DIAGNOSIS — Z720 Tobacco use: Secondary | ICD-10-CM | POA: Diagnosis not present

## 2014-01-30 DIAGNOSIS — Z8673 Personal history of transient ischemic attack (TIA), and cerebral infarction without residual deficits: Secondary | ICD-10-CM | POA: Insufficient documentation

## 2014-01-30 DIAGNOSIS — R197 Diarrhea, unspecified: Secondary | ICD-10-CM | POA: Diagnosis present

## 2014-01-30 LAB — COMPREHENSIVE METABOLIC PANEL
ALT: 11 U/L (ref 0–53)
ANION GAP: 13 (ref 5–15)
AST: 13 U/L (ref 0–37)
Albumin: 3.7 g/dL (ref 3.5–5.2)
Alkaline Phosphatase: 95 U/L (ref 39–117)
BILIRUBIN TOTAL: 0.4 mg/dL (ref 0.3–1.2)
BUN: 11 mg/dL (ref 6–23)
CHLORIDE: 103 meq/L (ref 96–112)
CO2: 24 meq/L (ref 19–32)
CREATININE: 0.82 mg/dL (ref 0.50–1.35)
Calcium: 9.3 mg/dL (ref 8.4–10.5)
GFR calc Af Amer: >90 mL/min (ref >90)
GLUCOSE: 114 mg/dL — AB (ref 70–99)
Potassium: 3.9 mEq/L (ref 3.7–5.3)
Sodium: 140 mEq/L (ref 137–147)
Total Protein: 8.1 g/dL (ref 6.0–8.3)

## 2014-01-30 LAB — CBC WITH DIFFERENTIAL/PLATELET
Basophils Absolute: 0.1 10*3/uL (ref 0.0–0.1)
Basophils Relative: 0 % (ref 0–1)
Eosinophils Absolute: 0.2 10*3/uL (ref 0.0–0.7)
Eosinophils Relative: 1 % (ref 0–5)
HEMATOCRIT: 46.1 % (ref 39.0–52.0)
HEMOGLOBIN: 15.8 g/dL (ref 13.0–17.0)
LYMPHS PCT: 21 % (ref 12–46)
Lymphs Abs: 2.7 10*3/uL (ref 0.7–4.0)
MCH: 30.7 pg (ref 26.0–34.0)
MCHC: 34.3 g/dL (ref 30.0–36.0)
MCV: 89.7 fL (ref 78.0–100.0)
MONO ABS: 1.1 10*3/uL — AB (ref 0.1–1.0)
MONOS PCT: 8 % (ref 3–12)
Neutro Abs: 9.2 10*3/uL — ABNORMAL HIGH (ref 1.7–7.7)
Neutrophils Relative %: 70 % (ref 43–77)
Platelets: 273 10*3/uL (ref 150–400)
RBC: 5.14 MIL/uL (ref 4.22–5.81)
RDW: 13.3 % (ref 11.5–15.5)
WBC: 13.2 10*3/uL — AB (ref 4.0–10.5)

## 2014-01-30 LAB — POC OCCULT BLOOD, ED: Fecal Occult Bld: NEGATIVE

## 2014-01-30 LAB — CLOSTRIDIUM DIFFICILE BY PCR: Toxigenic C. Difficile by PCR: POSITIVE — AB

## 2014-01-30 LAB — LACTIC ACID, PLASMA: LACTIC ACID, VENOUS: 0.9 mmol/L (ref 0.5–2.2)

## 2014-01-30 MED ORDER — SODIUM CHLORIDE 0.9 % IV BOLUS (SEPSIS)
1000.0000 mL | Freq: Once | INTRAVENOUS | Status: AC
  Administered 2014-01-30: 1000 mL via INTRAVENOUS

## 2014-01-30 MED ORDER — HYDROCORTISONE ACE-PRAMOXINE 2.5-1 % RE CREA: 1.0000 "application " | TOPICAL_CREAM | Freq: Three times a day (TID) | RECTAL | Status: DC

## 2014-01-30 MED ORDER — METRONIDAZOLE 500 MG PO TABS: 500.0000 mg | ORAL_TABLET | Freq: Three times a day (TID) | ORAL | Status: AC

## 2014-01-30 MED ORDER — HYDROMORPHONE HCL 1 MG/ML IJ SOLN
0.5000 mg | Freq: Once | INTRAMUSCULAR | Status: AC
  Administered 2014-01-30: 0.5 mg via INTRAVENOUS
  Filled 2014-01-30: qty 1

## 2014-01-30 MED ORDER — ONDANSETRON 4 MG PO TBDP: 4.0000 mg | ORAL_TABLET | Freq: Three times a day (TID) | ORAL | Status: DC | PRN

## 2014-01-30 MED ORDER — METRONIDAZOLE IN NACL 5-0.79 MG/ML-% IV SOLN
500.0000 mg | Freq: Once | INTRAVENOUS | Status: AC
Start: 2014-01-30 — End: 2014-01-30
  Administered 2014-01-30: 500 mg via INTRAVENOUS
  Filled 2014-01-30: qty 100

## 2014-01-30 NOTE — ED Notes (Signed)
Lab called pt + for c diff. EDP notifed.

## 2014-01-30 NOTE — Discharge Instructions (Signed)
As discussed, it is important to monitor your condition carefully, and it do not hesitate referred here if he develops new, or concerning changes in your condition.  Please take all medications as directed, and be sure to follow-up with your physicians.

## 2014-01-30 NOTE — ED Notes (Signed)
Diarrhea since week end.  Rectal pain,no vomiting   Has seen blood in stools , thinks may be from hemorrhoids.

## 2014-01-30 NOTE — ED Provider Notes (Signed)
CSN: [id number]     Arrival date & time 01/30/14  1306 History   First MD Initiated Contact with Patient 01/30/14 1514     Chief Complaint  Patient presents with  . Diarrhea      HPI  Patient presents with concern of persistent diarrhea, rectal pain. Patient's symptoms began approximately 5 days ago.  Since onset he has had innumerable episodes of loose stool, now with blood in stool.  There is no bleeding that is not associated with defecation. Patient has a history of hemorrhoids, states that this is active. Patient denies substantial abdominal pain, focal abdominal pain, states that he feels generally unsettled. There is associated anorexia, weakness, but no vomiting or fever, chills. Patient denies chest pain, dyspnea. Patient has not had any relief with OTC medication. Patient's history is notable recently for episode of sialoadenitis He completed ABX ten days ago, and has no current oropharyngeal complaints.    Past Medical History  Diagnosis Date  . Stroke 1997  . COPD (chronic obstructive pulmonary disease)   . Coronary artery disease   . Myocardial infarction     incidental  . Brain TIA     recurrent   . Carotid bruit    Past Surgical History  Procedure Laterality Date  . Back surgery    . Knee surgery     Family History  Problem Relation Age of Onset  . Cancer Father 42    stomach  . Coronary artery disease Mother 67  . Stroke Mother   . Coronary artery disease Brother 57    AMI deceased   History  Substance Use Topics  . Smoking status: Current Every Day Smoker -- 1.30 packs/day for 17 years    Types: Cigarettes  . Smokeless tobacco: Never Used  . Alcohol Use: No    Review of Systems  Constitutional: Positive for fatigue and unexpected weight change.  HENT:       Per HPI, otherwise negative  Respiratory:       Per HPI, otherwise negative  Cardiovascular:       Per HPI, otherwise negative  Gastrointestinal: Positive for diarrhea, blood in  stool, anal bleeding and rectal pain. Negative for nausea, vomiting and abdominal distention.  Endocrine:       Negative aside from HPI  Genitourinary:       Neg aside from HPI   Musculoskeletal:       Per HPI, otherwise negative  Skin: Negative.   Neurological: Positive for weakness. Negative for dizziness, syncope, facial asymmetry and headaches.      Allergies  Review of patient's allergies indicates no known allergies.  Home Medications   Prior to Admission medications   Not on File   BP 120/72  Pulse 68  Temp(Src) 98.1 F (36.7 C) (Oral)  Resp 18  Ht 6\' 1"  (1.854 m)  Wt 182 lb (82.555 kg)  BMI 24.02 kg/m2  SpO2 98% Physical Exam  Nursing note and vitals reviewed. Constitutional: He is oriented to person, place, and time. He appears ill.  HENT:  Head: Normocephalic and atraumatic.  Eyes: Conjunctivae and EOM are normal.  Cardiovascular: Normal rate and regular rhythm.   Pulmonary/Chest: Effort normal. No stridor. No respiratory distress.  Abdominal: He exhibits no distension.  Minimally tender, soft, non-peritoneal abdomen.  Musculoskeletal: He exhibits no edema.  Neurological: He is alert and oriented to person, place, and time.  Skin: Skin is warm and dry.  Psychiatric: He has a normal mood and affect.  ED Course  Procedures (including critical care time) Labs Review Labs Reviewed  CLOSTRIDIUM DIFFICILE BY PCR - Abnormal; Notable for the following:    C difficile by pcr POSITIVE (*)    All other components within normal limits  CBC WITH DIFFERENTIAL - Abnormal; Notable for the following:    WBC 13.2 (*)    Neutro Abs 9.2 (*)    Monocytes Absolute 1.1 (*)    All other components within normal limits  COMPREHENSIVE METABOLIC PANEL - Abnormal; Notable for the following:    Glucose, Bld 114 (*)    All other components within normal limits  LACTIC ACID, PLASMA  POC OCCULT BLOOD, ED    Imaging Review Ct Abdomen Pelvis Wo Contrast  01/30/2014    CLINICAL DATA:  Hematochezia and diarrhea  EXAM: CT ABDOMEN AND PELVIS WITHOUT CONTRAST  TECHNIQUE: Multidetector CT imaging of the abdomen and pelvis was performed following the standard protocol without oral or intravenous contrast material administration.  COMPARISON:  None.  FINDINGS: There is mild scarring in the lung bases. Lung bases are otherwise clear. There is a rather minimal pericardial effusion.  Liver is prominent, measuring 19.9 cm in length. There are several subcentimeter lesions scattered throughout the liver, most likely representing either small cysts or hamartomas. The largest lesion seen is a 1 x 1 cm cyst in the anterior segment of the right lobe of the liver. There is no biliary duct dilatation. Gallbladder wall is not thickened.  Spleen, pancreas, and adrenals appear within normal limits.  There is a 1 x 1 cm cyst in the upper pole of the left kidney laterally. There is a questionable mass in the upper pole the left kidney measuring 1.5 x 1.3 cm. This area shows a slight degree of increased attenuation compared to the remainder the kidney. This finding is seen on axial slice 24 series 2. There is a 7 x 7 mm cyst in the medial mid right kidney. There is a nearby 1 mm calculus in this area. There is a cyst in the medial mid right kidney measuring 1.6 x 1.6 cm. There is no evidence of renal calculus on the left. There is no ureteral calculus on either side.  The pelvis, the urinary bladder is midline with normal wall thickness. There are a few prostatic calculi. Stent pelvic mass or pelvic fluid. There is fat in each inguinal ring. Appendix appears normal. Terminal ileum appears normal.  There is no bowel obstruction. No free air or portal venous air. There is no ascites, adenopathy, or abscess in the abdomen or pelvis. There is atherosclerotic change in the aorta and iliac arteries. There is no abdominal aortic aneurysm. There is a small aneurysm of the left common iliac artery measuring 2.1 x  1.8 cm. There is degenerative change in the lumbar spine. There is vacuum phenomenon at L5-S1. There are no blastic or lytic bone lesions.  IMPRESSION: There is no bowel obstruction. No abscess. No appendiceal region abnormality. No mesenteric thickening.  Liver is prominent with multiple small lesions, probably representing either cysts or hamartomas in the liver.  There is a questionable mass in the upper pole the left kidney ; there is a focal area that shows modest increased attenuation compared to the remainder of the kidney. Further evaluation with pre and post contrast MRI or CT should be considered. MRI is preferred in younger patients (due to lack of ionizing radiation) and for evaluating calcified lesion(s).  Small nonobstructing calculi in right kidney. No hydronephrosis on  either side. No ureteral calculi.  Small left common iliac artery aneurysm.  Minimal pericardial effusion.   Electronically Signed   By: Lowella Grip M.D.   On: 01/30/2014 19:44   Dg Abd Acute W/chest  01/30/2014   CLINICAL DATA:  One week history of abdominal pain and diarrhea  EXAM: ACUTE ABDOMEN SERIES (ABDOMEN 2 VIEW & CHEST 1 VIEW)  COMPARISON:  Chest radiograph January 12, 2014  FINDINGS: PA chest: There is no edema or consolidation. Heart size and pulmonary vascularity are normal. No adenopathy.  Supine and upright abdomen: There is fairly diffuse stool throughout the colon. The bowel gas pattern is unremarkable. No obstruction or free air. There are multiple phleboliths in the pelvis.  IMPRESSION: Fairly diffuse stool throughout colon. Bowel gas pattern unremarkable. No lung edema or consolidation.   Electronically Signed   By: Lowella Grip M.D.   On: 01/30/2014 16:15     I reviewed the patient's electronic medical record.   Update: On repeat exam the patient still appears listless. Patient has not returned after first liter of fluid.  Update: Patient now has pain.  With concern for colitis, CT scan  performed.  Update: Following 2 L of fluid, and empiric therapy with Flagyl, the patient is better appearing, more awake, alert, interactive.  Update labs demonstrate positive C. difficile.  MDM   Final diagnoses:  Abdominal pain   Clostridium difficile colitis   His patient presents with ongoing diarrhea, rectal discomfort. Patient is Hemoccult negative, has stable hemoglobin, but does have leukocytosis, and positive PCR result for Clostridium difficile.  No evidence for abscess or perforation. Patient had fluid resuscitation with 2 L of fluid, and received Flagyl early in his course, empirically. Patient notes occasional improvement subjectively, and was discharged in stable condition to follow-up with primary care, gastroenterology.    Carmin Muskrat, MD 01/30/14 2049

## 2014-01-30 NOTE — ED Notes (Signed)
Pt alert & oriented x4, stable gait. Patient  given discharge instructions, paperwork & prescription(s).  Patient verbalized understanding. Pt left department in wheelchair w/ no further questions. 

## 2014-02-11 ENCOUNTER — Ambulatory Visit (INDEPENDENT_AMBULATORY_CARE_PROVIDER_SITE_OTHER): Payer: Medicare Other | Admitting: Family Medicine

## 2014-02-11 ENCOUNTER — Encounter (HOSPITAL_COMMUNITY): Payer: Self-pay | Admitting: *Deleted

## 2014-02-11 ENCOUNTER — Encounter (INDEPENDENT_AMBULATORY_CARE_PROVIDER_SITE_OTHER): Payer: Self-pay

## 2014-02-11 ENCOUNTER — Inpatient Hospital Stay (HOSPITAL_COMMUNITY)
Admission: EM | Admit: 2014-02-11 | Discharge: 2014-02-14 | DRG: 373 | Disposition: A | Discharge status: Home / Self Care | Payer: Medicare Other | Attending: Internal Medicine | Admitting: Internal Medicine

## 2014-02-11 ENCOUNTER — Emergency Department (HOSPITAL_COMMUNITY): Payer: Medicare Other

## 2014-02-11 VITALS — BP 106/72 | HR 125 | Temp 98.3°F | Wt 177.8 lb

## 2014-02-11 DIAGNOSIS — Z800 Family history of malignant neoplasm of digestive organs: Secondary | ICD-10-CM | POA: Diagnosis not present

## 2014-02-11 DIAGNOSIS — I252 Old myocardial infarction: Secondary | ICD-10-CM | POA: Diagnosis not present

## 2014-02-11 DIAGNOSIS — J449 Chronic obstructive pulmonary disease, unspecified: Secondary | ICD-10-CM | POA: Diagnosis present

## 2014-02-11 DIAGNOSIS — A0472 Enterocolitis due to Clostridium difficile, not specified as recurrent: Secondary | ICD-10-CM

## 2014-02-11 DIAGNOSIS — D72829 Elevated white blood cell count, unspecified: Secondary | ICD-10-CM

## 2014-02-11 DIAGNOSIS — Z8249 Family history of ischemic heart disease and other diseases of the circulatory system: Secondary | ICD-10-CM | POA: Diagnosis not present

## 2014-02-11 DIAGNOSIS — F17210 Nicotine dependence, cigarettes, uncomplicated: Secondary | ICD-10-CM | POA: Diagnosis present

## 2014-02-11 DIAGNOSIS — I2510 Atherosclerotic heart disease of native coronary artery without angina pectoris: Secondary | ICD-10-CM | POA: Diagnosis present

## 2014-02-11 DIAGNOSIS — R338 Other retention of urine: Secondary | ICD-10-CM | POA: Diagnosis present

## 2014-02-11 DIAGNOSIS — R197 Diarrhea, unspecified: Secondary | ICD-10-CM | POA: Diagnosis not present

## 2014-02-11 DIAGNOSIS — E860 Dehydration: Secondary | ICD-10-CM | POA: Insufficient documentation

## 2014-02-11 DIAGNOSIS — R339 Retention of urine, unspecified: Secondary | ICD-10-CM | POA: Diagnosis present

## 2014-02-11 DIAGNOSIS — N281 Cyst of kidney, acquired: Secondary | ICD-10-CM | POA: Diagnosis present

## 2014-02-11 DIAGNOSIS — Z823 Family history of stroke: Secondary | ICD-10-CM

## 2014-02-11 DIAGNOSIS — A047 Enterocolitis due to Clostridium difficile: Principal | ICD-10-CM | POA: Diagnosis present

## 2014-02-11 DIAGNOSIS — N2889 Other specified disorders of kidney and ureter: Secondary | ICD-10-CM

## 2014-02-11 DIAGNOSIS — A0471 Enterocolitis due to Clostridium difficile, recurrent: Secondary | ICD-10-CM | POA: Diagnosis present

## 2014-02-11 DIAGNOSIS — R531 Weakness: Secondary | ICD-10-CM

## 2014-02-11 DIAGNOSIS — R509 Fever, unspecified: Secondary | ICD-10-CM

## 2014-02-11 DIAGNOSIS — Z8673 Personal history of transient ischemic attack (TIA), and cerebral infarction without residual deficits: Secondary | ICD-10-CM | POA: Diagnosis not present

## 2014-02-11 DIAGNOSIS — R1110 Vomiting, unspecified: Secondary | ICD-10-CM

## 2014-02-11 HISTORY — DX: Other retention of urine: R33.8

## 2014-02-11 HISTORY — DX: Enterocolitis due to Clostridium difficile, not specified as recurrent: A04.72

## 2014-02-11 HISTORY — DX: Other specified disorders of kidney and ureter: N28.89

## 2014-02-11 HISTORY — DX: Enterocolitis due to Clostridium difficile, recurrent: A04.71

## 2014-02-11 LAB — CBC WITH DIFFERENTIAL/PLATELET
BASOS PCT: 0 % (ref 0–1)
Basophils Absolute: 0 10*3/uL (ref 0.0–0.1)
EOS PCT: 0 % (ref 0–5)
Eosinophils Absolute: 0 10*3/uL (ref 0.0–0.7)
HEMATOCRIT: 49 % (ref 39.0–52.0)
Hemoglobin: 16.8 g/dL (ref 13.0–17.0)
Lymphocytes Relative: 3 % — ABNORMAL LOW (ref 12–46)
Lymphs Abs: 0.8 10*3/uL (ref 0.7–4.0)
MCH: 30.6 pg (ref 26.0–34.0)
MCHC: 34.3 g/dL (ref 30.0–36.0)
MCV: 89.3 fL (ref 78.0–100.0)
MONO ABS: 1.8 10*3/uL — AB (ref 0.1–1.0)
MONOS PCT: 7 % (ref 3–12)
NEUTROS PCT: 90 % — AB (ref 43–77)
Neutro Abs: 24.5 10*3/uL — ABNORMAL HIGH (ref 1.7–7.7)
Platelets: 222 10*3/uL (ref 150–400)
RBC: 5.49 MIL/uL (ref 4.22–5.81)
RDW: 13.5 % (ref 11.5–15.5)
WBC: 27.1 10*3/uL — ABNORMAL HIGH (ref 4.0–10.5)

## 2014-02-11 LAB — URINALYSIS, ROUTINE W REFLEX MICROSCOPIC
Bilirubin Urine: NEGATIVE
GLUCOSE, UA: NEGATIVE mg/dL
Hgb urine dipstick: NEGATIVE
KETONES UR: NEGATIVE mg/dL
Leukocytes, UA: NEGATIVE
Nitrite: NEGATIVE
PH: 5.5 (ref 5.0–8.0)
PROTEIN: NEGATIVE mg/dL
Specific Gravity, Urine: >1.03 — ABNORMAL HIGH (ref 1.005–1.030)
Urobilinogen, UA: 0.2 mg/dL (ref 0.0–1.0)

## 2014-02-11 LAB — BASIC METABOLIC PANEL
Anion gap: 17 — ABNORMAL HIGH (ref 5–15)
BUN: 14 mg/dL (ref 6–23)
CALCIUM: 9.4 mg/dL (ref 8.4–10.5)
CO2: 21 mEq/L (ref 19–32)
CREATININE: 0.99 mg/dL (ref 0.50–1.35)
Chloride: 98 mEq/L (ref 96–112)
GFR calc Af Amer: >90 mL/min (ref >90)
GFR, EST NON AFRICAN AMERICAN: 84 mL/min — AB (ref >90)
GLUCOSE: 149 mg/dL — AB (ref 70–99)
Potassium: 4.6 mEq/L (ref 3.7–5.3)
Sodium: 136 mEq/L — ABNORMAL LOW (ref 137–147)

## 2014-02-11 LAB — CBC WITH DIFFERENTIAL
Basophils Absolute: 0 10*3/uL (ref 0.0–0.2)
Basos: 0 %
Eos: 0 %
Eosinophils Absolute: 0 10*3/uL (ref 0.0–0.4)
HCT: 47.1 % (ref 37.5–51.0)
Hemoglobin: 17 g/dL (ref 12.6–17.7)
Lymphocytes Absolute: 1.1 10*3/uL (ref 0.7–3.1)
Lymphs: 4 %
MCH: 30.6 pg (ref 26.6–33.0)
MCHC: 36.1 g/dL — ABNORMAL HIGH (ref 31.5–35.7)
MCV: 85 fL (ref 79–97)
Monocytes Absolute: 2.6 10*3/uL — ABNORMAL HIGH (ref 0.1–0.9)
Monocytes: 9 %
Neutrophils Absolute: 25 10*3/uL — ABNORMAL HIGH (ref 1.4–7.0)
Neutrophils Relative %: 87 %
Platelets: 265 10*3/uL (ref 150–379)
RBC: 5.55 x10E6/uL (ref 4.14–5.80)
RDW: 14.2 % (ref 12.3–15.4)
WBC: 28.7 10*3/uL (ref 3.4–10.8)

## 2014-02-11 LAB — INFLUENZA PANEL BY PCR (TYPE A & B)
H1N1 flu by pcr: NOT DETECTED
Influenza A By PCR: NEGATIVE
Influenza B By PCR: NEGATIVE

## 2014-02-11 LAB — TROPONIN I: Troponin I: <0.3 ng/mL (ref <0.30)

## 2014-02-11 LAB — LACTIC ACID, PLASMA: LACTIC ACID, VENOUS: 1.4 mmol/L (ref 0.5–2.2)

## 2014-02-11 LAB — LIPASE, BLOOD: Lipase: 26 U/L (ref 11–59)

## 2014-02-11 LAB — CLOSTRIDIUM DIFFICILE BY PCR: Toxigenic C. Difficile by PCR: POSITIVE — AB

## 2014-02-11 MED ORDER — ONDANSETRON 4 MG PO TBDP: 4.0000 mg | ORAL_TABLET | Freq: Three times a day (TID) | ORAL | Status: DC | PRN

## 2014-02-11 MED ORDER — ACETAMINOPHEN 325 MG PO TABS
650.0000 mg | ORAL_TABLET | Freq: Four times a day (QID) | ORAL | Status: DC | PRN
  Administered 2014-02-11 – 2014-02-12 (×2): 650 mg via ORAL
  Filled 2014-02-11 (×2): qty 2

## 2014-02-11 MED ORDER — VANCOMYCIN HCL 125 MG PO CAPS
125.0000 mg | ORAL_CAPSULE | Freq: Four times a day (QID) | ORAL | Status: DC
Start: 2014-02-11 — End: 2014-02-14

## 2014-02-11 MED ORDER — ONDANSETRON HCL 4 MG PO TABS
4.0000 mg | ORAL_TABLET | Freq: Four times a day (QID) | ORAL | Status: DC | PRN
  Administered 2014-02-11 – 2014-02-12 (×2): 4 mg via ORAL
  Filled 2014-02-11 (×2): qty 1

## 2014-02-11 MED ORDER — VANCOMYCIN 50 MG/ML ORAL SOLUTION
ORAL | Status: AC
  Filled 2014-02-11: qty 5

## 2014-02-11 MED ORDER — ZOLPIDEM TARTRATE 5 MG PO TABS
5.0000 mg | ORAL_TABLET | Freq: Once | ORAL | Status: AC
  Administered 2014-02-12: 5 mg via ORAL
  Filled 2014-02-11: qty 1

## 2014-02-11 MED ORDER — HEPARIN SODIUM (PORCINE) 5000 UNIT/ML IJ SOLN
5000.0000 [IU] | Freq: Three times a day (TID) | INTRAMUSCULAR | Status: DC
Start: 2014-02-11 — End: 2014-02-14
  Administered 2014-02-11 – 2014-02-14 (×9): 5000 [IU] via SUBCUTANEOUS
  Filled 2014-02-11 (×9): qty 1

## 2014-02-11 MED ORDER — SODIUM CHLORIDE 0.9 % IV SOLN
1000.0000 mL | Freq: Once | INTRAVENOUS | Status: AC
  Administered 2014-02-11: 1000 mL via INTRAVENOUS

## 2014-02-11 MED ORDER — HYDROCORTISONE 2.5 % RE CREA
TOPICAL_CREAM | Freq: Once | RECTAL | Status: AC
  Administered 2014-02-11: 17:00:00 via RECTAL
  Filled 2014-02-11: qty 28.35

## 2014-02-11 MED ORDER — VANCOMYCIN 50 MG/ML ORAL SOLUTION
250.0000 mg | Freq: Four times a day (QID) | ORAL | Status: DC
  Administered 2014-02-11 – 2014-02-14 (×11): 250 mg via ORAL
  Filled 2014-02-11 (×24): qty 5

## 2014-02-11 MED ORDER — ONDANSETRON HCL 4 MG/2ML IJ SOLN: 4.0000 mg | Freq: Four times a day (QID) | INTRAMUSCULAR | Status: DC | PRN

## 2014-02-11 MED ORDER — ONDANSETRON HCL 4 MG/2ML IJ SOLN
4.0000 mg | Freq: Once | INTRAMUSCULAR | Status: AC
  Administered 2014-02-11: 4 mg via INTRAVENOUS
  Filled 2014-02-11: qty 2

## 2014-02-11 MED ORDER — ACETAMINOPHEN 650 MG RE SUPP: 650.0000 mg | Freq: Four times a day (QID) | RECTAL | Status: DC | PRN

## 2014-02-11 MED ORDER — SODIUM CHLORIDE 0.9 % IV SOLN
1000.0000 mL | INTRAVENOUS | Status: DC
  Administered 2014-02-11 – 2014-02-12 (×2): 1000 mL via INTRAVENOUS

## 2014-02-11 MED ORDER — VANCOMYCIN 50 MG/ML ORAL SOLUTION
ORAL | Status: AC
  Filled 2014-02-11: qty 10

## 2014-02-11 MED ORDER — HYDROCORTISONE 1 % EX CREA: TOPICAL_CREAM | Freq: Once | CUTANEOUS | Status: DC

## 2014-02-11 MED ORDER — SODIUM CHLORIDE 0.9 % IV SOLN
INTRAVENOUS | Status: AC
  Administered 2014-02-11: 19:00:00 via INTRAVENOUS

## 2014-02-11 NOTE — ED Notes (Signed)
Vomiting , diarrhea, onset yesterday, Has had c-diff dx in past.

## 2014-02-11 NOTE — ED Provider Notes (Signed)
CSN: [id number]     Arrival date & time 02/11/14  1203 History   First MD Initiated Contact with Patient 02/11/14 1321         HPI Patient presents emergency department complaining of nausea vomiting and diarrhea which began tonight.  Reports generalized weakness and feels at times too weak to get up out of the bed.  He's had subjective fever and chills the day.  He was seen by his primary team earlier today and was noted to have a elevated white blood cell count was sent to the ER for evaluation.  He does report new diarrhea.  He recently completed a course of oral vancomycin for C. Difficile colitis.  He denies significant abdominal pain now.  Reports cough without significant shortness of breath.  No urinary complaints.  His family reports that he looks weak and the patient agrees that he feels very weak at this time.  His weakness is generalized and not localized  Past Medical History  Diagnosis Date  . Stroke 1997  . COPD (chronic obstructive pulmonary disease)   . Coronary artery disease   . Myocardial infarction     incidental  . Brain TIA     recurrent   . Carotid bruit   . C. difficile enteritis    Past Surgical History  Procedure Laterality Date  . Back surgery    . Knee surgery     Family History  Problem Relation Age of Onset  . Cancer Father 56    stomach  . Coronary artery disease Mother 2  . Stroke Mother   . Coronary artery disease Brother 39    AMI deceased   History  Substance Use Topics  . Smoking status: Current Every Day Smoker -- 1.30 packs/day for 17 years    Types: Cigarettes  . Smokeless tobacco: Never Used  . Alcohol Use: No    Review of Systems  All other systems reviewed and are negative.     Allergies  Review of patient's allergies indicates no known allergies.  Home Medications   Prior to Admission medications   Medication Sig Start Date End Date Taking? Authorizing Provider  hydrocortisone-pramoxine [facility]) 2.5-1 % rectal  cream Place 1 application rectally 3 (three) times daily. 01/30/14   Carmin Muskrat, MD  ondansetron (ZOFRAN ODT) 4 MG disintegrating tablet Take 1 tablet (4 mg total) by mouth every 8 (eight) hours as needed for nausea or vomiting. 02/11/14   Lysbeth Penner, FNP  vancomycin (VANCOCIN HCL) 125 MG capsule Take 1 capsule (125 mg total) by mouth 4 (four) times daily. 02/11/14   Lysbeth Penner, FNP   BP 100/46 mmHg  Pulse 98  Temp(Src) 99.6 F (37.6 C) (Oral)  Resp 16  Ht 6\' 1"  (1.854 m)  Wt 160 lb (72.576 kg)  BMI 21.11 kg/m2  SpO2 95% Physical Exam  Constitutional: He is oriented to person, place, and time. He appears well-developed and well-nourished.  HENT:  Head: Normocephalic and atraumatic.  Eyes: EOM are normal.  Neck: Normal range of motion.  Cardiovascular: Normal rate, regular rhythm, normal heart sounds and intact distal pulses.   Pulmonary/Chest: Effort normal and breath sounds normal. No respiratory distress.  Abdominal: Soft. He exhibits no distension. There is no tenderness.  Musculoskeletal: Normal range of motion.  Neurological: He is alert and oriented to person, place, and time.  Skin: Skin is warm and dry.  Psychiatric: He has a normal mood and affect. Judgment normal.  Nursing note and vitals  reviewed.   ED Course  Procedures (including critical care time) Labs Review Labs Reviewed  CBC WITH DIFFERENTIAL - Abnormal; Notable for the following:    WBC 27.1 (*)    Neutrophils Relative % 90 (*)    Neutro Abs 24.5 (*)    Lymphocytes Relative 3 (*)    Monocytes Absolute 1.8 (*)    All other components within normal limits  BASIC METABOLIC PANEL - Abnormal; Notable for the following:    Sodium 136 (*)    Glucose, Bld 149 (*)    GFR calc non Af Amer 84 (*)    Anion gap 17 (*)    All other components within normal limits  URINALYSIS, ROUTINE W REFLEX MICROSCOPIC - Abnormal; Notable for the following:    Specific Gravity, Urine >1.030 (*)    All other  components within normal limits  CLOSTRIDIUM DIFFICILE BY PCR  CULTURE, BLOOD (ROUTINE X 2)  CULTURE, BLOOD (ROUTINE X 2)  URINE CULTURE  LIPASE, BLOOD  LACTIC ACID, PLASMA  INFLUENZA PANEL BY PCR (TYPE A & B, H1N1)  TROPONIN I    Imaging Review Dg Chest Port 1 View  02/11/2014   CLINICAL DATA:  Weakness, vomiting, diarrhea and fever for 1 day. History of myocardial infarction and COPD and coronary artery disease.  EXAM: PORTABLE CHEST - 1 VIEW  COMPARISON:  01/30/2014.  FINDINGS: Cardiac silhouette is normal in size and configuration. Normal mediastinal and hilar contours. There are prominent vascular markings, but no areas of consolidation and no convincing pulmonary edema. No pleural effusion or pneumothorax.  Bony thorax is intact.  IMPRESSION: No acute cardiopulmonary disease.   Electronically Signed   By: Lajean Manes M.D.   On: 02/11/2014 15:55  I personally reviewed the imaging tests through PACS system I reviewed available ER/hospitalization records through the EMR    EKG Interpretation None      MDM   Final diagnoses:  Fever  Weakness    Despite 2 L of IV fluids the patient continues to feel generally weak.  Rectal temperature is 100.4.  Stool sample being given at this time.  Patient required assistance to even get over to the bedside commode.  Family reports this is abnormal for the patient.  Blood cultures pending.  Lactate pending.  In the office the patient's blood pressure was 106/72 with a heart rate of 125.  With IV fluids his heart rate has improved to 98 but his blood pressure still softer the 100/46.  I think the patient will benefit from hospitalization.  C. Difficile PCR pending at this time.  Patient will need to be placed in an isolation room for contact precautions as I suspect he has recurrent C. Difficile.White blood cell count 27,000. Influenza PCR sent    Hoy Morn, MD 02/11/14 [phone]

## 2014-02-11 NOTE — H&P (Signed)
[facility] History and Physical  Walter [name] EXB:[id number] DOB: Feb 22, 1951 DOA: 02/11/2014  Referring physician: ER PCP: [name], MD   Chief Complaint: diarrhea, nausea and vomiting, fever.  HPI: Walter [name] is a 63 y.o. male  This is a 63 year old man who was seen in the emergency room on 01/30/2014 with symptoms of diarrhea.C. Difficile toxin by PCR was positive and he was treated with oral vancomycin and he seemed to improve but over the last day or so, he has had nausea and vomiting associated with diarrhea and fever.evaluation in the emergency room shows him to have a rectal temperature of 100.4 and significantly elevated white count. He is now being admitted for further evaluation and management.   Review of Systems:  Constitutional:  No weight loss, night sweats HEENT:  No headaches, Difficulty swallowing,Tooth/dental problems,Sore throat,  No sneezing, itching, ear ache, nasal congestion, post nasal drip,  Cardio-vascular:  No chest pain, Orthopnea, PND, swelling in lower extremities, anasarca, dizziness, palpitations   Resp:  No shortness of breath with exertion or at rest. No excess mucus, no productive cough, No non-productive cough, No coughing up of blood.No change in color of mucus.No wheezing.No chest wall deformity  Skin:  no rash or lesions.  GU:  no dysuria, change in color of urine, no urgency or frequency. No flank pain.  Musculoskeletal:  No joint pain or swelling. No decreased range of motion. No back pain.  Psych:  No change in mood or affect. No depression or anxiety. No memory loss.   Past Medical History  Diagnosis Date  . Stroke 1997  . COPD (chronic obstructive pulmonary disease)   . Coronary artery disease   . Myocardial infarction     incidental  . Brain TIA     recurrent   . Carotid bruit   . C. difficile enteritis    Past Surgical History  Procedure Laterality Date  . Back surgery    . Knee surgery     Social History:   reports that he has been smoking Cigarettes.  He has a 22.1 pack-year smoking history. He has never used smokeless tobacco. He reports that he does not drink alcohol or use illicit drugs.  No Known Allergies  Family History  Problem Relation Age of Onset  . Cancer Father 94    stomach  . Coronary artery disease Mother 97  . Stroke Mother   . Coronary artery disease Brother 29    AMI deceased     Prior to Admission medications   Medication Sig Start Date End Date Taking? Authorizing Provider  hydrocortisone-pramoxine [facility]) 2.5-1 % rectal cream Place 1 application rectally 3 (three) times daily. 01/30/14   Carmin Muskrat, MD  ondansetron (ZOFRAN ODT) 4 MG disintegrating tablet Take 1 tablet (4 mg total) by mouth every 8 (eight) hours as needed for nausea or vomiting. 02/11/14   Lysbeth Penner, FNP  vancomycin (VANCOCIN HCL) 125 MG capsule Take 1 capsule (125 mg total) by mouth 4 (four) times daily. 02/11/14   Lysbeth Penner, FNP   Physical Exam: Filed Vitals:   02/11/14 1530 02/11/14 1545 02/11/14 1600 02/11/14 1617  BP: 119/64  134/67   Pulse: 102 98    Temp:    100.4 F (38 C)  TempSrc:    Rectal  Resp:      Height:      Weight:      SpO2: 94% 93%      Wt Readings from Last 3 Encounters:  02/11/14 72.576 kg (160 lb)  02/11/14 80.65 kg (177 lb 12.8 oz)  01/30/14 82.555 kg (182 lb)    General:  Appears warm. Eyes: PERRL, normal lids, irises & conjunctiva ENT: grossly normal hearing, lips & tongue Neck: no LAD, masses or thyromegaly Cardiovascular: RRR, no m/r/g. No LE edema. Telemetry: SR, no arrhythmias  Respiratory: CTA bilaterally, no w/r/r. Normal respiratory effort. Abdomen: soft, ntnd. Bowel sounds are active. No evidence of an acute abdomen. Skin: no rash or induration seen on limited exam Musculoskeletal: grossly normal tone BUE/BLE Psychiatric: grossly normal mood and affect, speech fluent and appropriate Neurologic: grossly non-focal.            Labs on Admission:  Basic Metabolic Panel:  Recent Labs Lab 02/11/14 1244  NA 136*  K 4.6  CL 98  CO2 21  GLUCOSE 149*  BUN 14  CREATININE 0.99  CALCIUM 9.4   Liver Function Tests: No results for input(s): AST, ALT, ALKPHOS, BILITOT, PROT, ALBUMIN in the last 168 hours.  Recent Labs Lab 02/11/14 1252  LIPASE 26   No results for input(s): AMMONIA in the last 168 hours. CBC:  Recent Labs Lab 02/11/14 1244  WBC 27.1*  NEUTROABS 24.5*  HGB 16.8  HCT 49.0  MCV 89.3  PLT 222   Cardiac Enzymes:  Recent Labs Lab 02/11/14 1630  TROPONINI <0.30    BNP (last 3 results) No results for input(s): PROBNP in the last 8760 hours. CBG: No results for input(s): GLUCAP in the last 168 hours.  Radiological Exams on Admission: Dg Chest Port 1 View  02/11/2014   CLINICAL DATA:  Weakness, vomiting, diarrhea and fever for 1 day. History of myocardial infarction and COPD and coronary artery disease.  EXAM: PORTABLE CHEST - 1 VIEW  COMPARISON:  01/30/2014.  FINDINGS: Cardiac silhouette is normal in size and configuration. Normal mediastinal and hilar contours. There are prominent vascular markings, but no areas of consolidation and no convincing pulmonary edema. No pleural effusion or pneumothorax.  Bony thorax is intact.  IMPRESSION: No acute cardiopulmonary disease.   Electronically Signed   By: Lajean Manes M.D.   On: 02/11/2014 15:55      Assessment/Plan   1. Weakness secondary to nausea, vomiting and recurrent diarrhea. I suspect he has recurrent C. Difficile colitis. 2. COPD, stable.  Plan: 1. Admit to medical floor. 2. Intravenous fluids. 3. Oral vancomycin with increased dose of 250 mg 4 times a day. 4. Consider CT scan of the abdomen if symptoms do not improve, as well as gastroenterologyconsultation.  Further recommendations will depend on patient's hospital progress.   Code Status: full code.   DVT Prophylaxis:heparin.  Family Communication: I discussed  the plan with the patient at the bedside.   Disposition Plan: home when medically stable.   Time spent: 60 minutes.  Doree Albee [facility] Pager [phone].

## 2014-02-11 NOTE — Progress Notes (Signed)
Subjective:    Patient ID: Walter [name], male    DOB: [birth year], 63 y.o.   MRN: [id number]  HPI Patient was recently admitted for c-diff colitis and tx'd with abx's.  His NVD have returned and he is weak.  He has been unable to hold down fluids for a day and diarrhea is wors.  Review of Systems  Constitutional: Positive for appetite change, fatigue and unexpected weight change. Negative for fever.  HENT: Negative for ear pain.   Eyes: Negative for discharge.  Respiratory: Negative for cough.   Cardiovascular: Negative for chest pain.  Gastrointestinal: Positive for nausea and diarrhea. Negative for abdominal distention.  Endocrine: Negative for polyuria.  Genitourinary: Negative for difficulty urinating.  Musculoskeletal: Negative for gait problem and neck pain.  Skin: Negative for color change and rash.  Neurological: Negative for speech difficulty and headaches.  Psychiatric/Behavioral: Negative for agitation.       Objective:    BP 106/72 mmHg  Pulse 125  Temp(Src) 98.3 F (36.8 C) (Oral)  Wt 177 lb 12.8 oz (80.65 kg) Physical Exam  Constitutional: He is oriented to person, place, and time.  Acutely ill appearing male  HENT:  Head: Normocephalic and atraumatic.  Mouth/Throat: Oropharynx is clear and moist.  Eyes: Pupils are equal, round, and reactive to light.  Neck: Normal range of motion. Neck supple.  Cardiovascular: Normal rate and regular rhythm.   No murmur heard. Rate tachy  Pulmonary/Chest: Effort normal and breath sounds normal.  Abdominal: Soft. There is no tenderness.  Bowel sounds hyperactive  Neurological: He is alert and oriented to person, place, and time.  Skin: Skin is warm and dry.  Psychiatric: He has a normal mood and affect.    Results for orders placed or performed during the hospital encounter of 01/30/14  Clostridium Difficile by PCR  Result Value Ref Range   C difficile by pcr POSITIVE (A) NEGATIVE  CBC with Differential  Result  Value Ref Range   WBC 13.2 (H) 4.0 - 10.5 K/uL   RBC 5.14 4.22 - 5.81 MIL/uL   Hemoglobin 15.8 13.0 - 17.0 g/dL   HCT 46.1 39.0 - 52.0 %   MCV 89.7 78.0 - 100.0 fL   MCH 30.7 26.0 - 34.0 pg   MCHC 34.3 30.0 - 36.0 g/dL   RDW 13.3 11.5 - 15.5 %   Platelets 273 150 - 400 K/uL   Neutrophils Relative % 70 43 - 77 %   Neutro Abs 9.2 (H) 1.7 - 7.7 K/uL   Lymphocytes Relative 21 12 - 46 %   Lymphs Abs 2.7 0.7 - 4.0 K/uL   Monocytes Relative 8 3 - 12 %   Monocytes Absolute 1.1 (H) 0.1 - 1.0 K/uL   Eosinophils Relative 1 0 - 5 %   Eosinophils Absolute 0.2 0.0 - 0.7 K/uL   Basophils Relative 0 0 - 1 %   Basophils Absolute 0.1 0.0 - 0.1 K/uL  Comprehensive metabolic panel  Result Value Ref Range   Sodium 140 137 - 147 mEq/L   Potassium 3.9 3.7 - 5.3 mEq/L   Chloride 103 96 - 112 mEq/L   CO2 24 19 - 32 mEq/L   Glucose, Bld 114 (H) 70 - 99 mg/dL   BUN 11 6 - 23 mg/dL   Creatinine, Ser 0.82 0.50 - 1.35 mg/dL   Calcium 9.3 8.4 - 10.5 mg/dL   Total Protein 8.1 6.0 - 8.3 g/dL   Albumin 3.7 3.5 - 5.2 g/dL  AST 13 0 - 37 U/L   ALT 11 0 - 53 U/L   Alkaline Phosphatase 95 39 - 117 U/L   Total Bilirubin 0.4 0.3 - 1.2 mg/dL   GFR calc non Af Amer >90 >90 mL/min   GFR calc Af Amer >90 >90 mL/min   Anion gap 13 5 - 15  Lactic acid, plasma  Result Value Ref Range   Lactic Acid, Venous 0.9 0.5 - 2.2 mmol/L  POC occult blood, ED RN will collect  Result Value Ref Range   Fecal Occult Bld NEGATIVE NEGATIVE    Lab staff report that wbc is 29.  Explained to patient he needs to be seen in ED and that he shouldn't go home and patient and wife agree to go to [facility] ED.  Family want to transport and patient agrees.  He is wheeled to [place] and wife leaves to take him to ED.    Assessment & Plan:     ICD-9-CM ICD-10-CM   1. Elevated WBC count 288.60 D72.829 Clostridium difficile EIA     vancomycin (VANCOCIN HCL) 125 MG capsule     CBC With differential/Platelet     CANCELED: POCT CBC  2.  Enteritis due to Clostridium difficile 008.45 A04.7 Clostridium difficile EIA     vancomycin (VANCOCIN HCL) 125 MG capsule     CBC With differential/Platelet  3. Intractable vomiting with nausea, vomiting of unspecified type 536.2 R11.10 Clostridium difficile EIA     vancomycin (VANCOCIN HCL) 125 MG capsule     ondansetron (ZOFRAN ODT) 4 MG disintegrating tablet     CBC With differential/Platelet     Return if symptoms worsen or fail to improve.  Lysbeth Penner FNP

## 2014-02-12 ENCOUNTER — Encounter (HOSPITAL_COMMUNITY): Payer: Self-pay | Admitting: *Deleted

## 2014-02-12 DIAGNOSIS — R338 Other retention of urine: Secondary | ICD-10-CM

## 2014-02-12 HISTORY — DX: Other retention of urine: R33.8

## 2014-02-12 LAB — URINALYSIS, ROUTINE W REFLEX MICROSCOPIC
BILIRUBIN URINE: NEGATIVE
Glucose, UA: NEGATIVE mg/dL
KETONES UR: NEGATIVE mg/dL
Leukocytes, UA: NEGATIVE
NITRITE: NEGATIVE
Protein, ur: NEGATIVE mg/dL
UROBILINOGEN UA: 0.2 mg/dL (ref 0.0–1.0)
pH: 5 (ref 5.0–8.0)

## 2014-02-12 LAB — URINE MICROSCOPIC-ADD ON

## 2014-02-12 LAB — CBC
HCT: 43.5 % (ref 39.0–52.0)
Hemoglobin: 14.4 g/dL (ref 13.0–17.0)
MCH: 29.8 pg (ref 26.0–34.0)
MCHC: 33.1 g/dL (ref 30.0–36.0)
MCV: 89.9 fL (ref 78.0–100.0)
PLATELETS: 204 10*3/uL (ref 150–400)
RBC: 4.84 MIL/uL (ref 4.22–5.81)
RDW: 13.7 % (ref 11.5–15.5)
WBC: 23.2 10*3/uL — AB (ref 4.0–10.5)

## 2014-02-12 LAB — COMPREHENSIVE METABOLIC PANEL
ALT: 12 U/L (ref 0–53)
AST: 12 U/L (ref 0–37)
Albumin: 3 g/dL — ABNORMAL LOW (ref 3.5–5.2)
Alkaline Phosphatase: 78 U/L (ref 39–117)
Anion gap: 12 (ref 5–15)
BUN: 11 mg/dL (ref 6–23)
CALCIUM: 8.3 mg/dL — AB (ref 8.4–10.5)
CHLORIDE: 102 meq/L (ref 96–112)
CO2: 21 mEq/L (ref 19–32)
CREATININE: 0.92 mg/dL (ref 0.50–1.35)
GFR calc non Af Amer: 87 mL/min — ABNORMAL LOW (ref >90)
GLUCOSE: 116 mg/dL — AB (ref 70–99)
Potassium: 3.9 mEq/L (ref 3.7–5.3)
Sodium: 135 mEq/L — ABNORMAL LOW (ref 137–147)
Total Bilirubin: 0.6 mg/dL (ref 0.3–1.2)
Total Protein: 6.4 g/dL (ref 6.0–8.3)

## 2014-02-12 MED ORDER — ZOLPIDEM TARTRATE 5 MG PO TABS
5.0000 mg | ORAL_TABLET | Freq: Every evening | ORAL | Status: DC | PRN
  Administered 2014-02-12 – 2014-02-13 (×2): 5 mg via ORAL
  Filled 2014-02-12 (×2): qty 1

## 2014-02-12 MED ORDER — LIDOCAINE HCL 2 % EX GEL
1.0000 | Freq: Once | CUTANEOUS | Status: AC
Start: 2014-02-12 — End: 2014-02-12
  Administered 2014-02-12: 1 via URETHRAL
  Filled 2014-02-12: qty 10

## 2014-02-12 MED ORDER — METRONIDAZOLE IN NACL 5-0.79 MG/ML-% IV SOLN
500.0000 mg | Freq: Three times a day (TID) | INTRAVENOUS | Status: DC
  Administered 2014-02-12 – 2014-02-14 (×7): 500 mg via INTRAVENOUS
  Filled 2014-02-12 (×8): qty 100

## 2014-02-12 MED ORDER — POTASSIUM CHLORIDE IN NACL 20-0.9 MEQ/L-% IV SOLN
INTRAVENOUS | Status: DC
  Administered 2014-02-12 – 2014-02-13 (×2): via INTRAVENOUS

## 2014-02-12 NOTE — Plan of Care (Signed)
Problem: Phase I Progression Outcomes Goal: OOB as tolerated unless otherwise ordered Outcome: Completed/Met Date Met:  02/12/14     

## 2014-02-12 NOTE — Progress Notes (Signed)
Pt complaining of restlessness. Notified MD. Will continue to monitor pt.

## 2014-02-12 NOTE — Plan of Care (Signed)
Problem: Phase I Progression Outcomes Goal: Voiding-avoid urinary catheter unless indicated Outcome: Completed/Met Date Met:  02/12/14     

## 2014-02-12 NOTE — Progress Notes (Signed)
Attempted to insert foley catheter for urinary retention.  Resistance was met and the catheter coiled in the urethra.  Notified MD and asked to advise.  Will continue to monitor.

## 2014-02-12 NOTE — Progress Notes (Signed)
[facility] PROGRESS NOTE  Walter [name] OVZ:[id number] DOB: 1951/02/02 DOA: 02/11/2014 PCP: [name], MD   Code Status: full code Family Communication: discussed with wife and daughter Disposition Plan: discharge when clinically appropriate   Consultants:  none  Procedures:  Insertion of coud catheter pending  Antibiotics:  Oral vancomycin 11/10>>  IV Flagyl 11/11>>  HPI/Subjective: The patient complains of loose stools which he has had 3 or 4 this morning. He has some lower abdominal discomfort. He is having some difficulty emptying his bladder.   Objective: Filed Vitals:   02/12/14 0537  BP: 119/62  Pulse: 117  Temp: 99.6 F (37.6 C)  Resp: 18    Intake/Output Summary (Last 24 hours) at 02/12/14 1338 Last data filed at 02/12/14 0538  Gross per 24 hour  Intake      0 ml  Output    300 ml  Net   -300 ml   Filed Weights   02/11/14 1213 02/12/14 0537  Weight: 72.576 kg (160 lb) 71.8 kg (158 lb 4.6 oz)    Exam:   General:  Ill-appearing 63 year old man laying in bed.  Cardiovascular: S1, S2, with mild tachycardia.  Respiratory: occasional crackles auscultated in the bases; breathing is nonlabored.  Abdomen: positive bowel sounds, soft, moderately tender and with mild distention over the bladder.  Musculoskeletal: no pedal edema. No acute hot red joints.  Neurologic: He is alert and oriented 3. Cranial nerves II through XII are intact.  Data Reviewed: Basic Metabolic Panel:  Recent Labs Lab 02/11/14 1244 02/12/14 0500  NA 136* 135*  K 4.6 3.9  CL 98 102  CO2 21 21  GLUCOSE 149* 116*  BUN 14 11  CREATININE 0.99 0.92  CALCIUM 9.4 8.3*   Liver Function Tests:  Recent Labs Lab 02/12/14 0500  AST 12  ALT 12  ALKPHOS 78  BILITOT 0.6  PROT 6.4  ALBUMIN 3.0*    Recent Labs Lab 02/11/14 1252  LIPASE 26   No results for input(s): AMMONIA in the last 168 hours. CBC:  Recent Labs Lab 02/11/14 1122 02/11/14 1244  02/12/14 0500  WBC 28.7* 27.1* 23.2*  NEUTROABS 25.0* 24.5*  --   HGB 17.0 16.8 14.4  HCT 47.1 49.0 43.5  MCV 85 89.3 89.9  PLT 265 222 204   Cardiac Enzymes:  Recent Labs Lab 02/11/14 1630  TROPONINI <0.30   BNP (last 3 results) No results for input(s): PROBNP in the last 8760 hours. CBG: No results for input(s): GLUCAP in the last 168 hours.  Recent Results (from the past 240 hour(s))  Clostridium Difficile by PCR     Status: Abnormal   Collection Time: 02/11/14  4:21 PM  Result Value Ref Range Status   C difficile by pcr POSITIVE (A) NEGATIVE Final    Comment: CRITICAL RESULT CALLED TO, READ BACK BY AND VERIFIED WITH: FORTE,L ON 02/11/14 AT 1805 BY LOY,C   Blood culture (routine x 2)     Status: None (Preliminary result)   Collection Time: 02/11/14  4:30 PM  Result Value Ref Range Status   Specimen Description BLOOD RIGHT WRIST  Final   Special Requests BOTTLES DRAWN AEROBIC AND ANAEROBIC 8CC EACH  Final   Culture PENDING  Incomplete   Report Status PENDING  Incomplete  Blood culture (routine x 2)     Status: None (Preliminary result)   Collection Time: 02/11/14  4:37 PM  Result Value Ref Range Status   Specimen Description BLOOD RIGHT ARM  Final  Special Requests BOTTLES DRAWN AEROBIC AND ANAEROBIC [place]  Final   Culture PENDING  Incomplete   Report Status PENDING  Incomplete     Studies: Dg Chest Port 1 View  02/11/2014   CLINICAL DATA:  Weakness, vomiting, diarrhea and fever for 1 day. History of myocardial infarction and COPD and coronary artery disease.  EXAM: PORTABLE CHEST - 1 VIEW  COMPARISON:  01/30/2014.  FINDINGS: Cardiac silhouette is normal in size and configuration. Normal mediastinal and hilar contours. There are prominent vascular markings, but no areas of consolidation and no convincing pulmonary edema. No pleural effusion or pneumothorax.  Bony thorax is intact.  IMPRESSION: No acute cardiopulmonary disease.   Electronically Signed   By: Lajean Manes M.D.   On: 02/11/2014 15:55    Scheduled Meds: . heparin  5,000 Units Subcutaneous 3 times per day  . lidocaine  1 application Urethral Once  . metronidazole  500 mg Intravenous Q8H  . vancomycin  250 mg Oral 4 times per day   Continuous Infusions: . sodium chloride 1,000 mL (02/12/14 1042)   Assessment plan:  Principal Problem:   Recurrent colitis due to Clostridium difficile Active Problems:   COPD (chronic obstructive pulmonary disease)   Weakness   Acute urinary retention   1. Recurrent versusprevious suboptimal treatment of C. Difficile colitis. The patient was started on oral vancomycin at 125 mg 4 times a day on 01/30/14. He did complete the course. -Follow-up C. Difficile PCR was again positive. -Dose of oral vancomycin increased 250 mg 4 times a day. Flagyl added today. -Continue supportive treatment and IV fluids.  Acute urinary retention. Nursing reports that the bladder scan shows greater than 999 cc of urine. He was able to urinate 100 cc spontaneously. -Insertion of a Foley catheter was met with resistance per the nursing staff. -We'll order the coud team to insert a coud catheter. If this is not successful, will have to transfer the patient to [facility] for urology consultation.  COPD. -Currently stable and compensated.  Leukocytosis. This is likely secondary to C. Difficile colitis. - His urinalysis on admission was unremarkable with exception of a high specific gravityand his chest x-ray revealed no acute findings. -blood cultures negative to date. -influenza panel negative. -We'll repeat the urinalysis. -Flagyl added as above.   Time spent: 35 minutes    [facility] Pager [phone] If 7PM-7AM, please contact night-coverage at [email or web address], password [facility] 02/12/2014, 1:38 PM  LOS: 1 day

## 2014-02-12 NOTE — Progress Notes (Signed)
Patient states that he has not voided.  Encouraged patient to void and patient's urine output was 100 cc's.  Bladder scan shows >999 cc's.  Notified MD.  Will continue to monitor.

## 2014-02-12 NOTE — Progress Notes (Signed)
UR chart review completed.  

## 2014-02-12 NOTE — Progress Notes (Signed)
Received order from MD to insert Coude catheter.  OR nurses inserted Coude catheter at bedside.  Patient tolerated insertion well and urine return was present.  Urine specimen sent to lab.  Nurse emptied 1100 cc's from catheter.  Will continue to monitor patient.

## 2014-02-13 LAB — BASIC METABOLIC PANEL
Anion gap: 9 (ref 5–15)
BUN: 9 mg/dL (ref 6–23)
CO2: 23 meq/L (ref 19–32)
CREATININE: 0.8 mg/dL (ref 0.50–1.35)
Calcium: 8.2 mg/dL — ABNORMAL LOW (ref 8.4–10.5)
Chloride: 107 mEq/L (ref 96–112)
GFR calc Af Amer: >90 mL/min (ref >90)
GLUCOSE: 92 mg/dL (ref 70–99)
Potassium: 4.1 mEq/L (ref 3.7–5.3)
Sodium: 139 mEq/L (ref 137–147)

## 2014-02-13 LAB — CBC
HEMATOCRIT: 39.2 % (ref 39.0–52.0)
HEMOGLOBIN: 12.9 g/dL — AB (ref 13.0–17.0)
MCH: 29.8 pg (ref 26.0–34.0)
MCHC: 32.9 g/dL (ref 30.0–36.0)
MCV: 90.5 fL (ref 78.0–100.0)
Platelets: 189 10*3/uL (ref 150–400)
RBC: 4.33 MIL/uL (ref 4.22–5.81)
RDW: 13.9 % (ref 11.5–15.5)
WBC: 13.1 10*3/uL — ABNORMAL HIGH (ref 4.0–10.5)

## 2014-02-13 LAB — URINE CULTURE
Colony Count: NO GROWTH
Culture: NO GROWTH

## 2014-02-13 MED ORDER — TAMSULOSIN HCL 0.4 MG PO CAPS
0.4000 mg | ORAL_CAPSULE | Freq: Every day | ORAL | Status: DC
  Administered 2014-02-13: 0.4 mg via ORAL
  Filled 2014-02-13: qty 1

## 2014-02-13 NOTE — Progress Notes (Signed)
[facility] PROGRESS NOTE  GALVIN AVERSA YQM:[id number] DOB: 03-Oct-1950 DOA: 02/11/2014 PCP: [name], MD   Code Status: full code Family Communication: discussed with wife  Disposition Plan: discharge when clinically appropriate   Consultants:  Urology consult ordered  Procedures:  Insertion of coud catheter on 02/12/14  Antibiotics:  Oral vancomycin 11/10>>  IV Flagyl 11/11>>  HPI/Subjective: (Delayed entry-patient seen earlier). The patient reports that he feels much better. He had only one loose stool early this morning. He denies chest pain or chest congestion. He wanted his diet advanced.   Objective: Filed Vitals:   02/13/14 0630  BP: 113/68  Pulse: 57  Temp: 97.5 F (36.4 C)  Resp: 20    Intake/Output Summary (Last 24 hours) at 02/13/14 1733 Last data filed at 02/13/14 0800  Gross per 24 hour  Intake 1687.92 ml  Output    800 ml  Net 887.92 ml   Filed Weights   02/11/14 1213 02/12/14 0537  Weight: 72.576 kg (160 lb) 71.8 kg (158 lb 4.6 oz)    Exam:   General:  63 year old Caucasian man who looks much better today.  Cardiovascular: S1, S2, with mild tachycardia.  Respiratory: occasional crackles auscultated in the bases; breathing is nonlabored.  Abdomen: positive bowel sounds, soft, nontender, nondistended.  Musculoskeletal: no pedal edema. No acute hot red joints.  Neurologic: He is alert and oriented 3. Cranial nerves II through XII are intact.  Data Reviewed: Basic Metabolic Panel:  Recent Labs Lab 02/11/14 1244 02/12/14 0500 02/13/14 0547  NA 136* 135* 139  K 4.6 3.9 4.1  CL 98 102 107  CO2 $Re'21 21 23  'wUW$ GLUCOSE 149* 116* 92  BUN $Re'14 11 9  'EvA$ CREATININE 0.99 0.92 0.80  CALCIUM 9.4 8.3* 8.2*   Liver Function Tests:  Recent Labs Lab 02/12/14 0500  AST 12  ALT 12  ALKPHOS 78  BILITOT 0.6  PROT 6.4  ALBUMIN 3.0*    Recent Labs Lab 02/11/14 1252  LIPASE 26   No results for input(s): AMMONIA in the last 168  hours. CBC:  Recent Labs Lab 02/11/14 1122 02/11/14 1244 02/12/14 0500 02/13/14 0547  WBC 28.7* 27.1* 23.2* 13.1*  NEUTROABS 25.0* 24.5*  --   --   HGB 17.0 16.8 14.4 12.9*  HCT 47.1 49.0 43.5 39.2  MCV 85 89.3 89.9 90.5  PLT 265 222 204 189   Cardiac Enzymes:  Recent Labs Lab 02/11/14 1630  TROPONINI <0.30   BNP (last 3 results) No results for input(s): PROBNP in the last 8760 hours. CBG: No results for input(s): GLUCAP in the last 168 hours.  Recent Results (from the past 240 hour(s))  Urine culture     Status: None   Collection Time: 02/11/14  3:33 PM  Result Value Ref Range Status   Specimen Description URINE, CLEAN CATCH  Final   Special Requests NONE  Final   Culture  Setup Time   Final    02/12/2014 13:39 Performed at [place] Performed at [facility]   Final   Culture NO GROWTH Performed at [facility]   Final   Report Status 02/13/2014 FINAL  Final  Clostridium Difficile by PCR     Status: Abnormal   Collection Time: 02/11/14  4:21 PM  Result Value Ref Range Status   C difficile by pcr POSITIVE (A) NEGATIVE Final    Comment: CRITICAL RESULT CALLED TO, READ BACK BY AND VERIFIED WITH: FORTE,L  ON 02/11/14 AT 1805 BY LOY,C   Blood culture (routine x 2)     Status: None (Preliminary result)   Collection Time: 02/11/14  4:30 PM  Result Value Ref Range Status   Specimen Description BLOOD RIGHT WRIST  Final   Special Requests BOTTLES DRAWN AEROBIC AND ANAEROBIC 8CC EACH  Final   Culture NO GROWTH 2 DAYS  Final   Report Status PENDING  Incomplete  Blood culture (routine x 2)     Status: None (Preliminary result)   Collection Time: 02/11/14  4:37 PM  Result Value Ref Range Status   Specimen Description BLOOD RIGHT ARM  Final   Special Requests BOTTLES DRAWN AEROBIC AND ANAEROBIC [place]  Final   Culture NO GROWTH 2 DAYS  Final   Report Status PENDING  Incomplete     Studies: No results  found.  Scheduled Meds: . heparin  5,000 Units Subcutaneous 3 times per day  . metronidazole  500 mg Intravenous Q8H  . tamsulosin  0.4 mg Oral QHS  . vancomycin  250 mg Oral 4 times per day   Continuous Infusions: . 0.9 % NaCl with KCl 20 mEq / L 125 mL/hr at 02/13/14 0031   Assessment plan:  Principal Problem:   Recurrent colitis due to Clostridium difficile Active Problems:   COPD (chronic obstructive pulmonary disease)   Weakness   Acute urinary retention   1. Recurrent versusprevious suboptimal treatment of C. Difficile colitis. The patient was started on oral vancomycin at 125 mg 4 times a day on 01/30/14. He did complete the course. -Follow-up C. Difficile PCR was again positive. Continue oral vancomycin at a higher dose and IV Flagyl. We'll likely transition to oral Flagyl alone tomorrow. Will advance his diet and decrease IV fluids.   Acute urinary retention. Nursing reported that the bladder scan shows greater than 999 cc of urine on 11/11.[name] He was able to urinate 100 cc spontaneously. -Insertion of a Foley catheter was met with resistance per the nursing staff, so a coud catheter was placed by the coud catheter team. He urinalysis does not reveal infective signs. Will order a renal ultrasound and consult urology tomorrow. We'll start Flomax empirically.  COPD. -Currently stable and compensated.  Leukocytosis. This is likely secondary to C. Difficile colitis. WBC much improved. - His urinalysis on admission was unremarkable with exception of a high specific gravityand his chest x-ray revealed no acute findings. -blood cultures negative to date. -influenza panel negative. -We'll repeat the urinalysis.   Time spent: 25 minutes    [facility] Pager [phone] If 7PM-7AM, please contact night-coverage at [email or web address], password [facility] 02/13/2014, 5:33 PM  LOS: 2 days

## 2014-02-13 NOTE — Care Management Note (Addendum)
    Page 1 of 1   02/14/2014     10:47:32 AM CARE MANAGEMENT NOTE 02/14/2014  Patient:  Walter [name], Walter [name]   Account Number:  [id number]  Date Initiated:  02/13/2014  Documentation initiated by:  Jolene Provost  Subjective/Objective Assessment:   Pt admitted for failed OP C-Diff tx. Pt is from home with wife. Pt had no HH services, medication needs or DME's prior to admission.     Action/Plan:   Pt plans to discharge home with self care. No CM needs identified at this time.   Anticipated DC Date:  02/15/2014   Anticipated DC Plan:  [place]  CM consult      Choice offered to / List presented to:             Status of service:  Completed, signed off Medicare Important Message given?  YES (If response is "NO", the following Medicare IM given date fields will be blank) Date Medicare IM given:  02/14/2014 Medicare IM given by:  Vladimir Creeks Date Additional Medicare IM given:   Additional Medicare IM given by:    Discharge Disposition:  HOME/SELF CARE  Per UR Regulation:    If discussed at Long Length of Stay Meetings, dates discussed:    Comments:  02/14/2014 [place], RN, MSN, [facility] Pt plans to discharge home with self care. Pt refuses HH PT. No further CM needs identified at this time.  02/13/2014 [place], RN, MSN, [facility]

## 2014-02-13 NOTE — Plan of Care (Signed)
Problem: Phase II Progression Outcomes Goal: Progress activity as tolerated unless otherwise ordered Outcome: Progressing Goal: Discharge plan established Outcome: Progressing Goal: Vital signs remain stable Outcome: Progressing

## 2014-02-14 ENCOUNTER — Inpatient Hospital Stay (HOSPITAL_COMMUNITY): Payer: Medicare Other

## 2014-02-14 ENCOUNTER — Encounter (HOSPITAL_COMMUNITY): Payer: Self-pay | Admitting: Internal Medicine

## 2014-02-14 ENCOUNTER — Ambulatory Visit: Payer: Medicare Other | Admitting: Gastroenterology

## 2014-02-14 DIAGNOSIS — N2889 Other specified disorders of kidney and ureter: Secondary | ICD-10-CM

## 2014-02-14 DIAGNOSIS — N289 Disorder of kidney and ureter, unspecified: Secondary | ICD-10-CM

## 2014-02-14 DIAGNOSIS — E860 Dehydration: Secondary | ICD-10-CM | POA: Insufficient documentation

## 2014-02-14 HISTORY — DX: Other specified disorders of kidney and ureter: N28.89

## 2014-02-14 LAB — BASIC METABOLIC PANEL
Anion gap: 11 (ref 5–15)
BUN: 6 mg/dL (ref 6–23)
CALCIUM: 8.2 mg/dL — AB (ref 8.4–10.5)
CHLORIDE: 105 meq/L (ref 96–112)
CO2: 21 mEq/L (ref 19–32)
CREATININE: 0.7 mg/dL (ref 0.50–1.35)
GFR calc non Af Amer: >90 mL/min (ref >90)
Glucose, Bld: 104 mg/dL — ABNORMAL HIGH (ref 70–99)
Potassium: 3.7 mEq/L (ref 3.7–5.3)
Sodium: 137 mEq/L (ref 137–147)

## 2014-02-14 LAB — CBC
HEMATOCRIT: 39.4 % (ref 39.0–52.0)
Hemoglobin: 13.4 g/dL (ref 13.0–17.0)
MCH: 30.1 pg (ref 26.0–34.0)
MCHC: 34 g/dL (ref 30.0–36.0)
MCV: 88.5 fL (ref 78.0–100.0)
Platelets: 207 10*3/uL (ref 150–400)
RBC: 4.45 MIL/uL (ref 4.22–5.81)
RDW: 13.5 % (ref 11.5–15.5)
WBC: 7.4 10*3/uL (ref 4.0–10.5)

## 2014-02-14 MED ORDER — ONDANSETRON HCL 4 MG PO TABS: 4.0000 mg | ORAL_TABLET | Freq: Three times a day (TID) | ORAL | Status: DC | PRN

## 2014-02-14 MED ORDER — ONDANSETRON 4 MG PO TBDP: 4.0000 mg | ORAL_TABLET | Freq: Three times a day (TID) | ORAL | Status: DC | PRN

## 2014-02-14 MED ORDER — TAMSULOSIN HCL 0.4 MG PO CAPS: 0.4000 mg | ORAL_CAPSULE | Freq: Every day | ORAL | Status: DC

## 2014-02-14 MED ORDER — METRONIDAZOLE 500 MG PO TABS: 500.0000 mg | ORAL_TABLET | Freq: Three times a day (TID) | ORAL | Status: DC

## 2014-02-14 NOTE — Discharge Summary (Signed)
Physician Discharge Summary  Walter [name]:[id number] DOB: December 03, 1950 DOA: 02/11/2014  PCP: Walter Gainer, MD  Admit date: 02/11/2014 Discharge date: 02/14/2014  Time spent: 40 minutes  Recommendations for Outpatient Follow-up:  1. Dr Walter [name] 02/17/14 for evaluation of urinary retention as well as consideration of OP MRI for solid left kidney nodule.  2. Follow up with Dr Walter [name] on 03/03/14 for evaluation of resolution of cdiff/diarrhea/nausea   Discharge Diagnoses:  Principal Problem:   Recurrent colitis due to Clostridium difficile Active Problems:   COPD (chronic obstructive pulmonary disease)   Weakness   Acute urinary retention   Nodule of kidney   Discharge Condition: stable  Diet recommendation: low fiber at discharge to be advanced as tolerated to heart healthy  Filed Weights   02/11/14 1213 02/12/14 0537  Weight: 72.576 kg (160 lb) 71.8 kg (158 lb 4.6 oz)    History of present illness:  This is a 62 year old man who was seen in the emergency room on 01/30/2014 with symptoms of diarrhea.C. Difficile toxin by PCR was positive and he was treated with flagyl for 7 days and he seemed to improve. On 02/11/14 he presented to ED with complaint of 2 day history of  nausea and vomiting associated with diarrhea and fever.evaluation in the emergency room showed him to have a rectal temperature of 100.4 and significantly elevated white count.  Hospital Course:  1. C. Difficile colitis.  Likely related to incomplete course of flagyl from 01/30/14 -02/05/14. Follow-up C. Difficile PCR was again positive. He was provided with oral vancomycin and IV Flagyl. He improved and diet was advanced. At discharge he has been afebrile for 24 hours. WBC within limits of normal. 3-4 BM's and tolerating soft diet without problem. Will be discharged with 14 days of flagyl. Will follow up with PCP 03/03/14   Acute urinary retention. Nursing reported that the bladder scan showed greater than 999 cc  of urine on 11/11.[name] He was able to urinate 100 cc spontaneously. Insertion of a Foley catheter was met with resistance per the nursing staff, so a coud catheter was placed by the coud catheter team. Urinalysis does not reveal infective signs. Renal ultrasound with no urinary obstruction. Will be discharged with foley catheter.  Has follow up appointment urology 02/17/14.  Provided with  Flomax empirically.  Solid nodule left kidney Renal US reveal 2.4 cm apparent solid nodule within the superior pole of the left kidney which cannot be characterized a simple renal cyst and may correlate with the ill-defined hyper attenuating lesion seen on prior noncontrast abdominal CT. Abdominal MRI recommended. Defer to urology   COPD. -Remained stable and compensated.  Leukocytosis. This is likely secondary to C. Difficile colitis. Resolved at discharge.  - His urinalysis on admission was unremarkable with exception of a high specific gravityand his chest x-ray revealed no acute findings. Blood cultures negative to date. Influenza panel negative.   Procedures:  none  Consultations:  none  Discharge Exam: Filed Vitals:   02/14/14 0629  BP: 125/61  Pulse:   Temp: 98.2 F (36.8 C)  Resp: 20    General: well nourished appears comfortable Cardiovascular: RRR  No MGR No LE edema Respiratory: normal effort BS clear bilaterally no wheeze  Discharge Instructions You were cared for by a hospitalist during your hospital stay. If you have any questions about your discharge medications or the care you received while you were in the hospital after you are discharged, you can call the unit and asked to speak  with the hospitalist on call if the hospitalist that took care of you is not available. Once you are discharged, your primary care physician will handle any further medical issues. Please note that NO REFILLS for any discharge medications will be authorized once you are discharged, as it is  imperative that you return to your primary care physician (or establish a relationship with a primary care physician if you do not have one) for your aftercare needs so that they can reassess your need for medications and monitor your lab values.   Current Discharge Medication List    START taking these medications   Details  metroNIDAZOLE (FLAGYL) 500 MG tablet Take 1 tablet (500 mg total) by mouth 3 (three) times daily. Qty: 42 tablet, Refills: 0    tamsulosin (FLOMAX) 0.4 MG CAPS capsule Take 1 capsule (0.4 mg total) by mouth at bedtime. Qty: 30 capsule, Refills: 0      CONTINUE these medications which have CHANGED   Details  ondansetron (ZOFRAN ODT) 4 MG disintegrating tablet Take 1 tablet (4 mg total) by mouth every 8 (eight) hours as needed for nausea or vomiting. Qty: 20 tablet, Refills: 0   Associated Diagnoses: Intractable vomiting with nausea, vomiting of unspecified type      CONTINUE these medications which have NOT CHANGED   Details  hydrocortisone-pramoxine (ANALPRAM-HC) 2.5-1 % rectal cream Place 1 application rectally 3 (three) times daily. Qty: 30 g, Refills: 0      STOP taking these medications     vancomycin (VANCOCIN HCL) 125 MG capsule        No Known Allergies Follow-up Information    Follow up with Walter Loa, MD On 02/17/2014.   Specialty:  Urology   Contact information:   [place] [id number] [phone]       Follow up with Walter Gainer, MD On 03/03/2014.   Specialty:  Family Medicine   Why:  appointment at 10:15   Contact information:   [place] [address] [phone]        The results of significant diagnostics from this hospitalization (including imaging, microbiology, ancillary and laboratory) are listed below for reference.    Significant Diagnostic Studies: Ct Abdomen Pelvis Wo Contrast  01/30/2014   CLINICAL DATA:  Hematochezia and diarrhea  EXAM: CT ABDOMEN AND PELVIS WITHOUT  CONTRAST  TECHNIQUE: Multidetector CT imaging of the abdomen and pelvis was performed following the standard protocol without oral or intravenous contrast material administration.  COMPARISON:  None.  FINDINGS: There is mild scarring in the lung bases. Lung bases are otherwise clear. There is a rather minimal pericardial effusion.  Liver is prominent, measuring 19.9 cm in length. There are several subcentimeter lesions scattered throughout the liver, most likely representing either small cysts or hamartomas. The largest lesion seen is a 1 x 1 cm cyst in the anterior segment of the right lobe of the liver. There is no biliary duct dilatation. Gallbladder wall is not thickened.  Spleen, pancreas, and adrenals appear within normal limits.  There is a 1 x 1 cm cyst in the upper pole of the left kidney laterally. There is a questionable mass in the upper pole the left kidney measuring 1.5 x 1.3 cm. This area shows a slight degree of increased attenuation compared to the remainder the kidney. This finding is seen on axial slice 24 series 2. There is a 7 x 7 mm cyst in the medial mid right kidney. There is a  nearby 1 mm calculus in this area. There is a cyst in the medial mid right kidney measuring 1.6 x 1.6 cm. There is no evidence of renal calculus on the left. There is no ureteral calculus on either side.  The pelvis, the urinary bladder is midline with normal wall thickness. There are a few prostatic calculi. Stent pelvic mass or pelvic fluid. There is fat in each inguinal ring. Appendix appears normal. Terminal ileum appears normal.  There is no bowel obstruction. No free air or portal venous air. There is no ascites, adenopathy, or abscess in the abdomen or pelvis. There is atherosclerotic change in the aorta and iliac arteries. There is no abdominal aortic aneurysm. There is a small aneurysm of the left common iliac artery measuring 2.1 x 1.8 cm. There is degenerative change in the lumbar spine. There is vacuum  phenomenon at L5-S1. There are no blastic or lytic bone lesions.  IMPRESSION: There is no bowel obstruction. No abscess. No appendiceal region abnormality. No mesenteric thickening.  Liver is prominent with multiple small lesions, probably representing either cysts or hamartomas in the liver.  There is a questionable mass in the upper pole the left kidney ; there is a focal area that shows modest increased attenuation compared to the remainder of the kidney. Further evaluation with pre and post contrast MRI or CT should be considered. MRI is preferred in younger patients (due to lack of ionizing radiation) and for evaluating calcified lesion(s).  Small nonobstructing calculi in right kidney. No hydronephrosis on either side. No ureteral calculi.  Small left common iliac artery aneurysm.  Minimal pericardial effusion.   Electronically Signed   By: Lowella Grip M.D.   On: 01/30/2014 19:44   US Renal  02/14/2014   CLINICAL DATA:  Potential mass involving the superior pole of the left kidney seen on prior noncontrast abdominal CT. Subsequent encounter.  EXAM: RENAL/URINARY TRACT ULTRASOUND COMPLETE  COMPARISON:  CT abdomen pelvis - 01/30/2014  FINDINGS: Right Kidney:  Normal cortical thickness, echogenicity and size, measuring 13.1 cm in length. Note is made of a approximately 1.9 x 1.5 x 1.8 cm anechoic cyst within the inferior pole of the left kidney. No echogenic renal stones. No urinary obstruction.  Left Kidney:  Normal cortical thickness, echogenicity and size, measuring 12.5 cm in length. There is an apparent approximately 2.4 x 2.4 ill-defined mixed echogenic apparent solid nodule / mass involving the superior pole of the left kidney (images 35 and 36). Note is made of a approximately 0.9 cm anechoic lesion within the superior pole the left kidney which is too small to adequately characterize of favored to represent renal cysts. No echogenic renal stones. No urinary obstruction.  Bladder:  There is a  minimal amount of debris seen within the urinary bladder (image 47). Foley catheter is seen within the urinary bladder which appears persistently mildly distended  IMPRESSION: 1. No urinary obstruction 2. And the approximately 2.4 cm apparent solid nodule within the superior pole of the left kidney which cannot be characterized a simple renal cyst and may correlate with the ill-defined hyper attenuating lesion seen on prior noncontrast abdominal CT. Assuming patient cannot receive intravenous contrast due to renal insufficiency, further evaluation with nonemergent noncontrast abdominal MRI is recommended. 3. Incidental note made of an approximately 1.9 cm anechoic cyst within the inferior pole the left kidney.   Electronically Signed   By: Sandi Mariscal M.D.   On: 02/14/2014 10:12   Dg Chest [facility] [address]  02/11/2014   CLINICAL DATA:  Weakness, vomiting, diarrhea and fever for 1 day. History of myocardial infarction and COPD and coronary artery disease.  EXAM: PORTABLE CHEST - 1 VIEW  COMPARISON:  01/30/2014.  FINDINGS: Cardiac silhouette is normal in size and configuration. Normal mediastinal and hilar contours. There are prominent vascular markings, but no areas of consolidation and no convincing pulmonary edema. No pleural effusion or pneumothorax.  Bony thorax is intact.  IMPRESSION: No acute cardiopulmonary disease.   Electronically Signed   By: Lajean Manes M.D.   On: 02/11/2014 15:55   Dg Abd Acute W/chest  01/30/2014   CLINICAL DATA:  One week history of abdominal pain and diarrhea  EXAM: ACUTE ABDOMEN SERIES (ABDOMEN 2 VIEW & CHEST 1 VIEW)  COMPARISON:  Chest radiograph January 12, 2014  FINDINGS: PA chest: There is no edema or consolidation. Heart size and pulmonary vascularity are normal. No adenopathy.  Supine and upright abdomen: There is fairly diffuse stool throughout the colon. The bowel gas pattern is unremarkable. No obstruction or free air. There are multiple phleboliths in the pelvis.   IMPRESSION: Fairly diffuse stool throughout colon. Bowel gas pattern unremarkable. No lung edema or consolidation.   Electronically Signed   By: Lowella Grip M.D.   On: 01/30/2014 16:15    Microbiology: Recent Results (from the past 240 hour(s))  Urine culture     Status: None   Collection Time: 02/11/14  3:33 PM  Result Value Ref Range Status   Specimen Description URINE, CLEAN CATCH  Final   Special Requests NONE  Final   Culture  Setup Time   Final    02/12/2014 13:39 Performed at [place] Performed at [facility]   Final   Culture NO GROWTH Performed at [facility]   Final   Report Status 02/13/2014 FINAL  Final  Clostridium Difficile by PCR     Status: Abnormal   Collection Time: 02/11/14  4:21 PM  Result Value Ref Range Status   C difficile by pcr POSITIVE (A) NEGATIVE Final    Comment: CRITICAL RESULT CALLED TO, READ BACK BY AND VERIFIED WITH: FORTE,L ON 02/11/14 AT 1805 BY LOY,C   Blood culture (routine x 2)     Status: None (Preliminary result)   Collection Time: 02/11/14  4:30 PM  Result Value Ref Range Status   Specimen Description BLOOD RIGHT WRIST  Final   Special Requests BOTTLES DRAWN AEROBIC AND ANAEROBIC 8CC EACH  Final   Culture NO GROWTH 2 DAYS  Final   Report Status PENDING  Incomplete  Blood culture (routine x 2)     Status: None (Preliminary result)   Collection Time: 02/11/14  4:37 PM  Result Value Ref Range Status   Specimen Description BLOOD RIGHT ARM  Final   Special Requests BOTTLES DRAWN AEROBIC AND ANAEROBIC 6CC EACH  Final   Culture NO GROWTH 2 DAYS  Final   Report Status PENDING  Incomplete     Labs: Basic Metabolic Panel:  Recent Labs Lab 02/11/14 1244 02/12/14 0500 02/13/14 0547 02/14/14 0631  NA 136* 135* 139 137  K 4.6 3.9 4.1 3.7  CL 98 102 107 105  CO2 $Re'21 21 23 21  'gXH$ GLUCOSE 149* 116* 92 104*  BUN $Re'14 11 9 6  'aSS$ CREATININE 0.99 0.92 0.80 0.70  CALCIUM 9.4 8.3* 8.2*  8.2*   Liver Function Tests:  Recent Labs Lab 02/12/14 0500  AST 12  ALT  12  ALKPHOS 78  BILITOT 0.6  PROT 6.4  ALBUMIN 3.0*    Recent Labs Lab 02/11/14 1252  LIPASE 26   No results for input(s): AMMONIA in the last 168 hours. CBC:  Recent Labs Lab 02/11/14 1122 02/11/14 1244 02/12/14 0500 02/13/14 0547 02/14/14 0631  WBC 28.7* 27.1* 23.2* 13.1* 7.4  NEUTROABS 25.0* 24.5*  --   --   --   HGB 17.0 16.8 14.4 12.9* 13.4  HCT 47.1 49.0 43.5 39.2 39.4  MCV 85 89.3 89.9 90.5 88.5  PLT 265 222 204 189 207   Cardiac Enzymes:  Recent Labs Lab 02/11/14 1630  TROPONINI <0.30   BNP: BNP (last 3 results) No results for input(s): PROBNP in the last 8760 hours. CBG: No results for input(s): GLUCAP in the last 168 hours.     SignedRadene Gunning  [facility] 02/14/2014, 12:20 PM

## 2014-02-14 NOTE — Plan of Care (Signed)
Problem: Phase II Progression Outcomes Goal: Progress activity as tolerated unless otherwise ordered Outcome: Progressing Goal: Discharge plan established Outcome: Progressing Goal: Vital signs remain stable Outcome: Progressing Goal: Obtain order to discontinue catheter if appropriate Outcome: Progressing

## 2014-02-14 NOTE — Evaluation (Signed)
Physical Therapy Evaluation Patient Details Name: Walter [name] MRN: [id number] DOB: 01/08/1951 Today's Date: 02/14/2014   History of Present Illness  This is a 63 year old man who was seen in the emergency room on 01/30/2014 with symptoms of diarrhea.C. Difficile toxin by PCR was positive and he was treated with oral vancomycin and he seemed to improve but over the last day or so, he has had nausea and vomiting associated with diarrhea and fever.evaluation in the emergency room shows him to have a rectal temperature of 100.4 and significantly elevated white count. He is now being admitted for further evaluation and management. Pt + for c-diff.  Clinical Impression  Pt presents with dependencies in balance affecting his gait. Pt had multiple LOB and needed assistance to prevent a fall. Pt reports he has had 2 CVA in the past affecting his right side. Pt had poor attention to task contributing to his fall risk. Pt will have supervision from his family after d/c home. Pt would benefit from continued acute PT to mazimize mobility and Independence for return home with 24 hour supervision with mobility and HHPT.    Follow Up Recommendations Home health PT    Equipment Recommendations  None recommended by PT    Recommendations for Other Services       Precautions / Restrictions Precautions Precautions: Fall;Other (comment) (contact) Restrictions Weight Bearing Restrictions: No      Mobility  Bed Mobility Overal bed mobility: Independent                Transfers Overall transfer level: Needs assistance Equipment used: None Transfers: Sit to/from Stand Sit to Stand: Min guard         General transfer comment: a general unsteadiness, UE on bed rail for assistance  Ambulation/Gait Ambulation/Gait assistance: Min assist Ambulation Distance (Feet): 250 Feet Assistive device: None;Rolling walker (2 wheeled) Gait Pattern/deviations: Step-through pattern;Decreased stride  length Gait velocity: decreased Gait velocity interpretation: Below normal speed for age/gender General Gait Details: Multiple LOB with gait without RW -pt was able to self correct some, but not all. Pt was better with RW, but still had 1 episode of LOB and was able to self correct. Pt was highly distracted during walk contributing to his decreased balance.  Stairs            Wheelchair Mobility    Modified Rankin (Stroke Patients Only)       Balance Overall balance assessment: Needs assistance   Sitting balance-Leahy Scale: Normal     Standing balance support: No upper extremity supported Standing balance-Leahy Scale: Poor                               Pertinent Vitals/Pain Pain Assessment: No/denies pain    Home Living Family/patient expects to be discharged to:: Private residence Living Arrangements: Spouse/significant other Available Help at Discharge: Family Type of Home: House Home Access: Stairs to enter Entrance Stairs-Rails: Right Entrance Stairs-Number of Steps: 3 Home Layout: One level Home Equipment: Environmental consultant - 2 wheels      Prior Function Level of Independence: Independent               Hand Dominance        Extremity/Trunk Assessment   Upper Extremity Assessment: Defer to OT evaluation           Lower Extremity Assessment: Overall WFL for tasks assessed         Communication  Communication: No difficulties  Cognition Arousal/Alertness: Awake/alert Behavior During Therapy: WFL for tasks assessed/performed Overall Cognitive Status: Impaired/Different from baseline Area of Impairment: Attention   Current Attention Level: Sustained                General Comments      Exercises        Assessment/Plan    PT Assessment Patient needs continued PT services  PT Diagnosis Difficulty walking   PT Problem List Decreased activity tolerance;Decreased balance;Decreased mobility;Decreased safety awareness   PT Treatment Interventions Gait training;DME instruction;Therapeutic activities;Therapeutic exercise;Balance training;Cognitive remediation;Patient/family education   PT Goals (Current goals can be found in the Care Plan section) Acute Rehab PT Goals Patient Stated Goal: none stated PT Goal Formulation: With patient Time For Goal Achievement: 02/28/14 Potential to Achieve Goals: Good    Frequency Min 3X/week   Barriers to discharge        Co-evaluation               End of Session Equipment Utilized During Treatment: Gait belt Activity Tolerance: Patient tolerated treatment well Patient left: in bed;with call bell/phone within reach;with family/visitor present Nurse Communication: Mobility status         Time: 9357-0177 PT Time Calculation (min) (ACUTE ONLY): 27 min   Charges:   PT Evaluation $Initial PT Evaluation Tier I: 1 Procedure PT Treatments $Gait Training: 8-22 mins   PT G Codes:          Lelon Mast 02/14/2014, 9:17 AM

## 2014-02-14 NOTE — Progress Notes (Signed)
Patient discharged with instructions, prescription, and care notes.  Verbalized understanding via teach back.  IV was removed and the site was WNL. Patient and his wife voiced no further complaints or concerns at the time of discharge.  Appointments scheduled per instructions.  Patient left the floor via w/c with staff and family in stable condition.

## 2014-02-16 LAB — CULTURE, BLOOD (ROUTINE X 2)
CULTURE: NO GROWTH
Culture: NO GROWTH

## 2014-02-17 ENCOUNTER — Other Ambulatory Visit (HOSPITAL_COMMUNITY): Payer: Self-pay | Admitting: Urology

## 2014-02-17 DIAGNOSIS — D49519 Neoplasm of unspecified behavior of unspecified kidney: Secondary | ICD-10-CM

## 2014-02-18 ENCOUNTER — Telehealth: Payer: Self-pay | Admitting: Family Medicine

## 2014-02-18 NOTE — Telephone Encounter (Signed)
Pt given appt with Bill oxford 12/1 @ 11:45

## 2014-03-03 ENCOUNTER — Ambulatory Visit (HOSPITAL_COMMUNITY)
Admission: RE | Admit: 2014-03-03 | Discharge: 2014-03-03 | Disposition: A | Discharge status: Home / Self Care | Payer: Medicare Other | Source: Ambulatory Visit | Attending: Urology | Admitting: Urology

## 2014-03-03 ENCOUNTER — Ambulatory Visit: Payer: Medicare Other | Admitting: Family Medicine

## 2014-03-03 DIAGNOSIS — D49519 Neoplasm of unspecified behavior of unspecified kidney: Secondary | ICD-10-CM

## 2014-03-03 DIAGNOSIS — N281 Cyst of kidney, acquired: Secondary | ICD-10-CM | POA: Diagnosis not present

## 2014-03-03 DIAGNOSIS — R934 Abnormal findings on diagnostic imaging of urinary organs: Secondary | ICD-10-CM | POA: Diagnosis present

## 2014-03-03 DIAGNOSIS — K7689 Other specified diseases of liver: Secondary | ICD-10-CM | POA: Diagnosis not present

## 2014-03-03 MED ORDER — GADOBENATE DIMEGLUMINE 529 MG/ML IV SOLN
16.0000 mL | Freq: Once | INTRAVENOUS | Status: AC | PRN
Start: 2014-03-03 — End: 2014-03-03
  Administered 2014-03-03: 16 mL via INTRAVENOUS

## 2014-03-04 ENCOUNTER — Telehealth: Payer: Self-pay | Admitting: *Deleted

## 2014-03-04 ENCOUNTER — Encounter: Payer: Self-pay | Admitting: Family Medicine

## 2014-03-04 ENCOUNTER — Ambulatory Visit (INDEPENDENT_AMBULATORY_CARE_PROVIDER_SITE_OTHER): Payer: Medicare Other | Admitting: Family Medicine

## 2014-03-04 VITALS — BP 148/80 | HR 84 | Temp 96.8°F | Ht 73.0 in | Wt 177.8 lb

## 2014-03-04 DIAGNOSIS — N400 Enlarged prostate without lower urinary tract symptoms: Secondary | ICD-10-CM

## 2014-03-04 DIAGNOSIS — A0472 Enterocolitis due to Clostridium difficile, not specified as recurrent: Secondary | ICD-10-CM

## 2014-03-04 DIAGNOSIS — A047 Enterocolitis due to Clostridium difficile: Secondary | ICD-10-CM

## 2014-03-04 DIAGNOSIS — I10 Essential (primary) hypertension: Secondary | ICD-10-CM

## 2014-03-04 MED ORDER — TAMSULOSIN HCL 0.4 MG PO CAPS: 0.4000 mg | ORAL_CAPSULE | Freq: Every day | ORAL | Status: DC

## 2014-03-04 MED ORDER — AMLODIPINE BESYLATE 5 MG PO TABS: 5.0000 mg | ORAL_TABLET | Freq: Every day | ORAL | Status: DC

## 2014-03-04 NOTE — Progress Notes (Signed)
   Subjective:    Patient ID: Walter [name], male    DOB: [birth year], 63 y.o.   MRN: [id number]  HPI Patient is here for follow up.  He was recently seen for c-difficile colitis and was given IV vanco and flagyl and his sx's resolved.  He did finish a course of 10 days of oral flagyl.  He is no longer having any diarrhea, fever, or abdominal pain.  He has been having elevated blood pressure readings and he has hx bph and has been taking flomax which is working well.  Review of Systems  Constitutional: Negative for fever.  HENT: Negative for ear pain.   Eyes: Negative for discharge.  Respiratory: Negative for cough.   Cardiovascular: Negative for chest pain.  Gastrointestinal: Negative for abdominal distention.  Endocrine: Negative for polyuria.  Genitourinary: Negative for difficulty urinating.  Musculoskeletal: Negative for gait problem and neck pain.  Skin: Negative for color change and rash.  Neurological: Negative for speech difficulty and headaches.  Psychiatric/Behavioral: Negative for agitation.       Objective:    BP 148/80 mmHg  Pulse 84  Temp(Src) 96.8 F (36 C) (Oral)  Ht 6\' 1"  (1.854 m)  Wt 177 lb 12.8 oz (80.65 kg)  BMI 23.46 kg/m2 Physical Exam  Constitutional: He is oriented to person, place, and time. He appears well-developed and well-nourished.  HENT:  Head: Normocephalic and atraumatic.  Mouth/Throat: Oropharynx is clear and moist.  Eyes: Pupils are equal, round, and reactive to light.  Neck: Normal range of motion. Neck supple.  Cardiovascular: Normal rate and regular rhythm.   No murmur heard. Pulmonary/Chest: Effort normal and breath sounds normal.  Abdominal: Soft. Bowel sounds are normal. There is no tenderness.  Neurological: He is alert and oriented to person, place, and time.  Skin: Skin is warm and dry.  Psychiatric: He has a normal mood and affect.          Assessment & Plan:     ICD-9-CM ICD-10-CM   1. BPH (benign prostatic  hyperplasia) 600.00 N40.0 tamsulosin (FLOMAX) 0.4 MG CAPS capsule  2. Essential hypertension 401.9 I10 amLODipine (NORVASC) 5 MG tablet  3. Enteritis due to Clostridium difficile 008.45 A04.7    C Diff has resolved and explained to follow up prn  Return if symptoms worsen or fail to improve.  Lysbeth Penner FNP

## 2014-03-04 NOTE — Telephone Encounter (Signed)
Script for flomax called to [facility].

## 2014-05-22 ENCOUNTER — Ambulatory Visit (INDEPENDENT_AMBULATORY_CARE_PROVIDER_SITE_OTHER): Payer: Commercial Managed Care - HMO | Admitting: Family Medicine

## 2014-05-22 ENCOUNTER — Encounter: Payer: Self-pay | Admitting: Family Medicine

## 2014-05-22 VITALS — BP 154/80 | HR 90 | Temp 98.6°F | Ht 73.0 in | Wt 180.0 lb

## 2014-05-22 DIAGNOSIS — K645 Perianal venous thrombosis: Secondary | ICD-10-CM | POA: Diagnosis not present

## 2014-05-22 MED ORDER — HYDROCORTISONE 2.5 % RE CREA: 1.0000 "application " | TOPICAL_CREAM | Freq: Two times a day (BID) | RECTAL | Status: DC

## 2014-05-22 NOTE — Progress Notes (Signed)
Subjective:    Patient ID: Walter [name], male    DOB: 1951/01/20, 64 y.o.   MRN: [id number]  HPI Patient here today for rectal pain that started about 1 week ago. The patient indicates he has had rectal exams recently. He's had some problems with diarrhea about a month ago and that that has gotten better. He also has some problems with voiding and that is now better. He indicates his bowels have been moving normally with minimal pain in the rectal area with a bowel movement. Is not been any bleeding. The bowel movements are normal in consistency and color. There is no abdominal pain.          Patient Active Problem List   Diagnosis Date Noted  . Nodule of kidney 02/14/2014  . Dehydration   . Acute urinary retention 02/12/2014  . Weakness 02/11/2014  . Recurrent colitis due to Clostridium difficile 02/11/2014  . Unresponsiveness 12/05/2012  . Occlusion and stenosis of carotid artery without mention of cerebral infarction 09/07/2012  . CVA (cerebral infarction) 02/18/2012  . Tobacco abuse 02/18/2012  . Non-compliance 02/18/2012  . COPD (chronic obstructive pulmonary disease) 02/18/2012   Outpatient Encounter Prescriptions as of 05/22/2014  Medication Sig  . [DISCONTINUED] amLODipine (NORVASC) 5 MG tablet Take 1 tablet (5 mg total) by mouth daily.  . [DISCONTINUED] hydrocortisone-pramoxine (ANALPRAM-HC) 2.5-1 % rectal cream Place 1 application rectally 3 (three) times daily. (Patient not taking: Reported on 03/04/2014)  . [DISCONTINUED] tamsulosin (FLOMAX) 0.4 MG CAPS capsule Take 1 capsule (0.4 mg total) by mouth at bedtime.    Review of Systems  Constitutional: Negative.   HENT: Negative.   Eyes: Negative.   Respiratory: Negative.   Cardiovascular: Negative.   Gastrointestinal: Positive for rectal pain.  Endocrine: Negative.   Genitourinary: Negative.   Musculoskeletal: Negative.   Skin: Negative.   Allergic/Immunologic: Negative.   Neurological: Negative.     Hematological: Negative.   Psychiatric/Behavioral: Negative.         .Objective:   Physical Exam  Constitutional: He is oriented to person, place, and time. He appears well-developed and well-nourished.  Abdominal: Soft. Bowel sounds are normal. He exhibits no distension. There is no tenderness. There is no rebound and no guarding.  There is some gaseous distention in the abdomen but minimal tenderness.  Genitourinary: Penis normal. No penile tenderness.  The prostate is enlarged. The rectum externally has a large thrombosed hemorrhoid to the right and appears   swollen and is tender to palpation. There is some small hemorrhoids to the left. There were no rectal masses. The external genitalia were normal and there were no hernias palpable.  Musculoskeletal: Normal range of motion.  Neurological: He is oriented to person, place, and time.  Skin: Skin is warm and dry.  Psychiatric: He has a normal mood and affect. His behavior is normal. Thought content normal.  Nursing note and vitals reviewed.  BP 154/80 mmHg  Pulse 90  Temp(Src) 98.6 F (37 C) (Oral)  Ht 6\' 1"  (1.854 m)  Wt 180 lb (81.647 kg)  BMI 23.75 kg/m2        Assessment & Plan:  1. Thrombosed external hemorrhoid -Do Soaks 4-6 times daily or use warm wet compresses and a doughnut cushion - hydrocortisone (ANUSOL-HC) 2.5 % rectal cream; Place 1 application rectally 2 (two) times daily.  Dispense: 30 g; Refill: 0 -Take extra strength Tylenol as needed for pain and drink plenty of fluids  Patient Instructions  Tub soaks or warm wet  compresses to anal area for 5 or 6 times daily. Use a doughnut cushion Use rectal cream twice daily Return to clinic on Monday and if no better may need referral to surgeon for further evaluation Drink plenty of fluids so that you do not have to strain to have a bowel movement    Arrie Senate MD

## 2014-05-22 NOTE — Patient Instructions (Signed)
Tub soaks or warm wet compresses to anal area for 5 or 6 times daily. Use a doughnut cushion Use rectal cream twice daily Return to clinic on Monday and if no better may need referral to surgeon for further evaluation Drink plenty of fluids so that you do not have to strain to have a bowel movement

## 2014-05-27 ENCOUNTER — Ambulatory Visit: Payer: Medicare HMO | Admitting: Family Medicine

## 2014-05-29 ENCOUNTER — Encounter: Payer: Self-pay | Admitting: Family Medicine

## 2014-05-29 ENCOUNTER — Ambulatory Visit (INDEPENDENT_AMBULATORY_CARE_PROVIDER_SITE_OTHER): Payer: Commercial Managed Care - HMO | Admitting: Family Medicine

## 2014-05-29 VITALS — BP 135/75 | HR 78 | Temp 96.9°F | Ht 73.0 in | Wt 183.0 lb

## 2014-05-29 DIAGNOSIS — K645 Perianal venous thrombosis: Secondary | ICD-10-CM | POA: Diagnosis not present

## 2014-05-29 NOTE — Patient Instructions (Signed)
Continue with cream and tub soaks for another 5-7 days then use them only as needed. Drink plenty of water to keep her bowels moving regularly Return and recheck the rectal area again in 3-4 weeks for 1 final evaluation.

## 2014-05-29 NOTE — Progress Notes (Signed)
   Subjective:    Patient ID: Walter [name], male    DOB: [birth year], 64 y.o.   MRN: [id number]  HPI Patient here today for 1 week follow up from visit for rectal pain. He states he feels better. The patient has been doing tub soaks frequently and using the ProctoCream regularly and is feeling much better.        Patient Active Problem List   Diagnosis Date Noted  . Nodule of kidney 02/14/2014  . Dehydration   . Acute urinary retention 02/12/2014  . Weakness 02/11/2014  . Recurrent colitis due to Clostridium difficile 02/11/2014  . Unresponsiveness 12/05/2012  . Occlusion and stenosis of carotid artery without mention of cerebral infarction 09/07/2012  . CVA (cerebral infarction) 02/18/2012  . Tobacco abuse 02/18/2012  . Non-compliance 02/18/2012  . COPD (chronic obstructive pulmonary disease) 02/18/2012   Outpatient Encounter Prescriptions as of 05/29/2014  Medication Sig  . amLODipine (NORVASC) 5 MG tablet   . hydrocortisone (ANUSOL-HC) 2.5 % rectal cream Place 1 application rectally 2 (two) times daily.  . tamsulosin (FLOMAX) 0.4 MG CAPS capsule Take 0.4 mg by mouth at bedtime.    Review of Systems  Constitutional: Negative.   HENT: Negative.   Eyes: Negative.   Respiratory: Negative.   Cardiovascular: Negative.   Gastrointestinal: Positive for rectal pain (resolving).  Endocrine: Negative.   Genitourinary: Negative.   Musculoskeletal: Negative.   Skin: Negative.   Allergic/Immunologic: Negative.   Neurological: Negative.   Hematological: Negative.   Psychiatric/Behavioral: Negative.        Objective:   Physical Exam  Constitutional: He is oriented to person, place, and time. He appears well-developed and well-nourished. No distress.  Genitourinary:  The hemorrhoids around the rectum appeared improved especially the one at 3:00 position. It is less inflamed and more involuted and less tender to palpation.  Musculoskeletal: Normal range of motion.    Neurological: He is alert and oriented to person, place, and time.  Skin: Skin is warm. No rash noted.  The inflamed external hemorrhoid appears greatly improved  Psychiatric: He has a normal mood and affect. His behavior is normal. Thought content normal.  Vitals reviewed.  BP 135/75 mmHg  Pulse 78  Temp(Src) 96.9 F (36.1 C) (Oral)  Ht 6\' 1"  (1.854 m)  Wt 183 lb (83.008 kg)  BMI 24.15 kg/m2        Assessment & Plan:  1. Thrombosed external hemorrhoid The hemorrhoid has improved and the patient is feeling better Continue treatment until medication is completed Recheck the rectal area and 3-4 weeks  Patient Instructions  Continue with cream and tub soaks for another 5-7 days then use them only as needed. Drink plenty of water to keep her bowels moving regularly Return and recheck the rectal area again in 3-4 weeks for 1 final evaluation.   Arrie Senate MD

## 2014-06-19 ENCOUNTER — Ambulatory Visit: Payer: Commercial Managed Care - HMO | Admitting: Family Medicine

## 2014-12-25 ENCOUNTER — Encounter: Payer: Commercial Managed Care - HMO | Admitting: Family Medicine

## 2014-12-26 ENCOUNTER — Encounter: Payer: Self-pay | Admitting: Family Medicine

## 2015-01-05 ENCOUNTER — Telehealth: Payer: Self-pay | Admitting: Family Medicine

## 2015-01-12 ENCOUNTER — Ambulatory Visit (INDEPENDENT_AMBULATORY_CARE_PROVIDER_SITE_OTHER): Payer: Commercial Managed Care - HMO | Admitting: Family Medicine

## 2015-01-12 ENCOUNTER — Encounter: Payer: Self-pay | Admitting: Family Medicine

## 2015-01-12 VITALS — BP 157/89 | HR 74 | Temp 96.9°F | Ht 73.0 in | Wt 184.4 lb

## 2015-01-12 DIAGNOSIS — I10 Essential (primary) hypertension: Secondary | ICD-10-CM | POA: Insufficient documentation

## 2015-01-12 DIAGNOSIS — Z9119 Patient's noncompliance with other medical treatment and regimen: Secondary | ICD-10-CM

## 2015-01-12 DIAGNOSIS — Z720 Tobacco use: Secondary | ICD-10-CM | POA: Diagnosis not present

## 2015-01-12 DIAGNOSIS — N400 Enlarged prostate without lower urinary tract symptoms: Secondary | ICD-10-CM | POA: Insufficient documentation

## 2015-01-12 DIAGNOSIS — I6350 Cerebral infarction due to unspecified occlusion or stenosis of unspecified cerebral artery: Secondary | ICD-10-CM

## 2015-01-12 DIAGNOSIS — Z91199 Patient's noncompliance with other medical treatment and regimen due to unspecified reason: Secondary | ICD-10-CM

## 2015-01-12 MED ORDER — AMLODIPINE BESYLATE 5 MG PO TABS: 5.0000 mg | ORAL_TABLET | Freq: Every day | ORAL | Status: DC

## 2015-01-12 MED ORDER — ASPIRIN EC 81 MG PO TBEC
81.0000 mg | DELAYED_RELEASE_TABLET | Freq: Every day | ORAL | Status: DC
Start: 2015-01-12 — End: 2017-08-15

## 2015-01-12 NOTE — Progress Notes (Signed)
   HPI  Patient presents today for physical exam and discuss chronic problems.  He feels well in denies any complaint. He states that he is generally active around home but does not exercise formally. He does not watch his diet.  Has a history of 2 strokes, BPH, and hypertension.  Hypertension Not watching diet not taking amlodipine No chest pain, dyspnea, palpitations, leg edema  Stroke No residual effects, previously had right facial numbness, dysarthria, and right arm numbness  Tobacco abuse Considering quitting but doesn't really want to discuss today  PMH: Smoking status noted ROS: Per HPI  Objective: BP 157/89 mmHg  Pulse 74  Temp(Src) 96.9 F (36.1 C) (Oral)  Ht _0  (1.854 m)  Wt 184 lb 6.4 oz (83.643 kg)  BMI 24.33 kg/m2 Gen: NAD, alert, cooperative with exam HEENT: NCAT, nares clear, oropharynx clear, and TMs normal bilaterally CV: RRR, good S1/S2, no murmur Resp: CTABL, no wheezes, non-labored Abd: SNTND, BS present, no guarding or organomegaly Ext: No edema, warm Neuro: Alert and oriented, No gross deficits  Declines DRE  Assessment and plan:  # Hypertension Elevated Restart amlodipine, 5 mg Labs   # History of stroke, lacunar infarcts Start atorvastatin 20 mg Start daily aspirin  # Smoking Recommended cessation, he's not really contemplative  # BPH Check PSA  Noncompliance Unclear why he is not really interested in medications, he does mention commercials that site side effects of statins, provided reassurance    Orders Placed This Encounter  Procedures  . PSA  . CBC  . CMP14+EGFR  . Lipid Panel    Meds ordered this encounter  Medications  . amLODipine (NORVASC) 5 MG tablet    Sig: Take 1 tablet (5 mg total) by mouth daily.    Dispense:  30 tablet    Refill:  Yoe, MD [facility] 01/12/2015, 2:32 PM

## 2015-01-12 NOTE — Patient Instructions (Signed)
Great to meet you!  You should also be on lipitor to reduce the risk of stroke  Please consider stopping smoking  Come back in 2 months to discuss hTN  Hypertension Hypertension, commonly called high blood pressure, is when the force of blood pumping through your arteries is too strong. Your arteries are the blood vessels that carry blood from your heart throughout your body. A blood pressure reading consists of a higher number over a lower number, such as 110/72. The higher number (systolic) is the pressure inside your arteries when your heart pumps. The lower number (diastolic) is the pressure inside your arteries when your heart relaxes. Ideally you want your blood pressure below 120/80. Hypertension forces your heart to work harder to pump blood. Your arteries may become narrow or stiff. Having untreated or uncontrolled hypertension can cause heart attack, stroke, kidney disease, and other problems. RISK FACTORS Some risk factors for high blood pressure are controllable. Others are not.  Risk factors you cannot control include:   Race. You may be at higher risk if you are African American.  Age. Risk increases with age.  Gender. Men are at higher risk than women before age 70 years. After age 24, women are at higher risk than men. Risk factors you can control include:  Not getting enough exercise or physical activity.  Being overweight.  Getting too much fat, sugar, calories, or salt in your diet.  Drinking too much alcohol. SIGNS AND SYMPTOMS Hypertension does not usually cause signs or symptoms. Extremely high blood pressure (hypertensive crisis) may cause headache, anxiety, shortness of breath, and nosebleed. DIAGNOSIS To check if you have hypertension, your health care provider will measure your blood pressure while you are seated, with your arm held at the level of your heart. It should be measured at least twice using the same arm. Certain conditions can cause a difference  in blood pressure between your right and left arms. A blood pressure reading that is higher than normal on one occasion does not mean that you need treatment. If it is not clear whether you have high blood pressure, you may be asked to return on a different day to have your blood pressure checked again. Or, you may be asked to monitor your blood pressure at home for 1 or more weeks. TREATMENT Treating high blood pressure includes making lifestyle changes and possibly taking medicine. Living a healthy lifestyle can help lower high blood pressure. You may need to change some of your habits. Lifestyle changes may include:  Following the DASH diet. This diet is high in fruits, vegetables, and whole grains. It is low in salt, red meat, and added sugars.  Keep your sodium intake below 2,300 mg per day.  Getting at least 30-45 minutes of aerobic exercise at least 4 times per week.  Losing weight if necessary.  Not smoking.  Limiting alcoholic beverages.  Learning ways to reduce stress. Your health care provider may prescribe medicine if lifestyle changes are not enough to get your blood pressure under control, and if one of the following is true:  You are 67-33 years of age and your systolic blood pressure is above 140.  You are 63 years of age or older, and your systolic blood pressure is above 150.  Your diastolic blood pressure is above 90.  You have diabetes, and your systolic blood pressure is over 903 or your diastolic blood pressure is over 90.  You have kidney disease and your blood pressure is above 140/90.  You have heart disease and your blood pressure is above 140/90. Your personal target blood pressure may vary depending on your medical conditions, your age, and other factors. HOME CARE INSTRUCTIONS  Have your blood pressure rechecked as directed by your health care provider.   Take medicines only as directed by your health care provider. Follow the directions carefully.  Blood pressure medicines must be taken as prescribed. The medicine does not work as well when you skip doses. Skipping doses also puts you at risk for problems.  Do not smoke.   Monitor your blood pressure at home as directed by your health care provider. SEEK MEDICAL CARE IF:   You think you are having a reaction to medicines taken.  You have recurrent headaches or feel dizzy.  You have swelling in your ankles.  You have trouble with your vision. SEEK IMMEDIATE MEDICAL CARE IF:  You develop a severe headache or confusion.  You have unusual weakness, numbness, or feel faint.  You have severe chest or abdominal pain.  You vomit repeatedly.  You have trouble breathing. MAKE SURE YOU:   Understand these instructions.  Will watch your condition.  Will get help right away if you are not doing well or get worse.   This information is not intended to replace advice given to you by your health care provider. Make sure you discuss any questions you have with your health care provider.   Document Released: 03/21/2005 Document Revised: 08/05/2014 Document Reviewed: 01/11/2013 Elsevier Interactive Patient Education [employer].

## 2015-01-13 LAB — CBC
HEMATOCRIT: 47.2 % (ref 37.5–51.0)
Hemoglobin: 16.2 g/dL (ref 12.6–17.7)
MCH: 29.8 pg (ref 26.6–33.0)
MCHC: 34.3 g/dL (ref 31.5–35.7)
MCV: 87 fL (ref 79–97)
PLATELETS: 242 10*3/uL (ref 150–379)
RBC: 5.44 x10E6/uL (ref 4.14–5.80)
RDW: 14.5 % (ref 12.3–15.4)
WBC: 9.8 10*3/uL (ref 3.4–10.8)

## 2015-01-13 LAB — CMP14+EGFR
A/G RATIO: 1.4 (ref 1.1–2.5)
ALK PHOS: 86 IU/L (ref 39–117)
ALT: 14 IU/L (ref 0–44)
AST: 16 IU/L (ref 0–40)
Albumin: 4 g/dL (ref 3.6–4.8)
BUN/Creatinine Ratio: 13 (ref 10–22)
BUN: 12 mg/dL (ref 8–27)
Bilirubin Total: 0.3 mg/dL (ref 0.0–1.2)
CO2: 23 mmol/L (ref 18–29)
Calcium: 9.3 mg/dL (ref 8.6–10.2)
Chloride: 101 mmol/L (ref 97–108)
Creatinine, Ser: 0.89 mg/dL (ref 0.76–1.27)
GFR calc Af Amer: 103 mL/min/{1.73_m2} (ref >59)
GFR calc non Af Amer: 89 mL/min/{1.73_m2} (ref >59)
GLOBULIN, TOTAL: 2.9 g/dL (ref 1.5–4.5)
Glucose: 90 mg/dL (ref 65–99)
POTASSIUM: 4.2 mmol/L (ref 3.5–5.2)
SODIUM: 137 mmol/L (ref 134–144)
Total Protein: 6.9 g/dL (ref 6.0–8.5)

## 2015-01-13 LAB — LIPID PANEL
CHOLESTEROL TOTAL: 219 mg/dL — AB (ref 100–199)
Chol/HDL Ratio: 9.1 ratio units — ABNORMAL HIGH (ref 0.0–5.0)
HDL: 24 mg/dL — ABNORMAL LOW (ref >39)
LDL Calculated: 129 mg/dL — ABNORMAL HIGH (ref 0–99)
TRIGLYCERIDES: 329 mg/dL — AB (ref 0–149)
VLDL Cholesterol Cal: 66 mg/dL — ABNORMAL HIGH (ref 5–40)

## 2015-01-13 LAB — PSA: Prostate Specific Ag, Serum: 1.4 ng/mL (ref 0.0–4.0)

## 2015-03-05 ENCOUNTER — Ambulatory Visit (INDEPENDENT_AMBULATORY_CARE_PROVIDER_SITE_OTHER): Payer: Commercial Managed Care - HMO | Admitting: Family Medicine

## 2015-03-05 ENCOUNTER — Encounter: Payer: Self-pay | Admitting: Family Medicine

## 2015-03-05 VITALS — BP 186/90 | HR 80 | Temp 97.2°F | Ht 73.0 in | Wt 189.6 lb

## 2015-03-05 DIAGNOSIS — Z9119 Patient's noncompliance with other medical treatment and regimen: Secondary | ICD-10-CM

## 2015-03-05 DIAGNOSIS — I6350 Cerebral infarction due to unspecified occlusion or stenosis of unspecified cerebral artery: Secondary | ICD-10-CM

## 2015-03-05 DIAGNOSIS — Z91199 Patient's noncompliance with other medical treatment and regimen due to unspecified reason: Secondary | ICD-10-CM

## 2015-03-05 DIAGNOSIS — I10 Essential (primary) hypertension: Secondary | ICD-10-CM

## 2015-03-05 NOTE — Patient Instructions (Signed)
Great to see you!  PLease keep a blood pressure log and come back in 2 weeks to review it and recheck your blood pressure.   Hypertension Hypertension, commonly called high blood pressure, is when the force of blood pumping through your arteries is too strong. Your arteries are the blood vessels that carry blood from your heart throughout your body. A blood pressure reading consists of a higher number over a lower number, such as 110/72. The higher number (systolic) is the pressure inside your arteries when your heart pumps. The lower number (diastolic) is the pressure inside your arteries when your heart relaxes. Ideally you want your blood pressure below 120/80. Hypertension forces your heart to work harder to pump blood. Your arteries may become narrow or stiff. Having untreated or uncontrolled hypertension can cause heart attack, stroke, kidney disease, and other problems. RISK FACTORS Some risk factors for high blood pressure are controllable. Others are not.  Risk factors you cannot control include:   Race. You may be at higher risk if you are African American.  Age. Risk increases with age.  Gender. Men are at higher risk than women before age 59 years. After age 23, women are at higher risk than men. Risk factors you can control include:  Not getting enough exercise or physical activity.  Being overweight.  Getting too much fat, sugar, calories, or salt in your diet.  Drinking too much alcohol. SIGNS AND SYMPTOMS Hypertension does not usually cause signs or symptoms. Extremely high blood pressure (hypertensive crisis) may cause headache, anxiety, shortness of breath, and nosebleed. DIAGNOSIS To check if you have hypertension, your health care provider will measure your blood pressure while you are seated, with your arm held at the level of your heart. It should be measured at least twice using the same arm. Certain conditions can cause a difference in blood pressure between your  right and left arms. A blood pressure reading that is higher than normal on one occasion does not mean that you need treatment. If it is not clear whether you have high blood pressure, you may be asked to return on a different day to have your blood pressure checked again. Or, you may be asked to monitor your blood pressure at home for 1 or more weeks. TREATMENT Treating high blood pressure includes making lifestyle changes and possibly taking medicine. Living a healthy lifestyle can help lower high blood pressure. You may need to change some of your habits. Lifestyle changes may include:  Following the DASH diet. This diet is high in fruits, vegetables, and whole grains. It is low in salt, red meat, and added sugars.  Keep your sodium intake below 2,300 mg per day.  Getting at least 30-45 minutes of aerobic exercise at least 4 times per week.  Losing weight if necessary.  Not smoking.  Limiting alcoholic beverages.  Learning ways to reduce stress. Your health care provider may prescribe medicine if lifestyle changes are not enough to get your blood pressure under control, and if one of the following is true:  You are 72-29 years of age and your systolic blood pressure is above 140.  You are 45 years of age or older, and your systolic blood pressure is above 150.  Your diastolic blood pressure is above 90.  You have diabetes, and your systolic blood pressure is over [id number] or your diastolic blood pressure is over 90.  You have kidney disease and your blood pressure is above 140/90.  You have heart disease  and your blood pressure is above 140/90. Your personal target blood pressure may vary depending on your medical conditions, your age, and other factors. HOME CARE INSTRUCTIONS  Have your blood pressure rechecked as directed by your health care provider.   Take medicines only as directed by your health care provider. Follow the directions carefully. Blood pressure medicines must be  taken as prescribed. The medicine does not work as well when you skip doses. Skipping doses also puts you at risk for problems.  Do not smoke.   Monitor your blood pressure at home as directed by your health care provider. SEEK MEDICAL CARE IF:   You think you are having a reaction to medicines taken.  You have recurrent headaches or feel dizzy.  You have swelling in your ankles.  You have trouble with your vision. SEEK IMMEDIATE MEDICAL CARE IF:  You develop a severe headache or confusion.  You have unusual weakness, numbness, or feel faint.  You have severe chest or abdominal pain.  You vomit repeatedly.  You have trouble breathing. MAKE SURE YOU:   Understand these instructions.  Will watch your condition.  Will get help right away if you are not doing well or get worse.   This information is not intended to replace advice given to you by your health care provider. Make sure you discuss any questions you have with your health care provider.   Document Released: 03/21/2005 Document Revised: 08/05/2014 Document Reviewed: 01/11/2013 Elsevier Interactive Patient Education [employer].

## 2015-03-05 NOTE — Progress Notes (Signed)
   HPI  Patient presents today get his DMV forms filled out.  Hypertension Not checking at home, he states he has been compliant with amlodipine No headache, chest pain, palpitations, leg edema.  I believe that he is getting his DMV forms because of his previous stroke. The ophthalmology portion is been filled out by his eye doctor. He had an acute CVA in the mid left corona radiata and the internal capsule, he states that he has no residual deficits, memory loss, or other concerns after this. He takes a daily aspirin. He denies any accidents, hospitalizations, seizure disorders or history of seizure  He is not sure why he is in the [facility] review system  PMH: Smoking status noted ROS: Per HPI  Objective: BP 186/90 mmHg  Pulse 80  Temp(Src) 97.2 F (36.2 C) (Oral)  Ht 6\' 1"  (1.854 m)  Wt 189 lb 9.6 oz (86.002 kg)  BMI 25.02 kg/m2 Gen: NAD, alert, cooperative with exam HEENT: NCAT CV: RRR, good S1/S2, no murmur Resp: CTABL, no wheezes, non-labored Ext: No edema, warm Neuro: Alert and oriented, No gross deficits  Assessment and plan:  # CVA Patient denies any residual effects He is on a daily aspirin as well as a statin I have given him no limitations on driving  # Hypertension Uncontrolled Believe that he is very nervous today considering the DMV reviewed process, it elevated higher than it was when he restarted amlodipine last time Continue current dose of amlodipine Given a blood pressure log He has a follow-up appointment in 11 days, I encouraged him to keep that  # Noncompliance Improving, He seems to be more compliant now    Laroy Apple, MD [facility] 03/05/2015, 3:48 PM

## 2015-03-16 ENCOUNTER — Ambulatory Visit: Payer: Commercial Managed Care - HMO | Admitting: Family Medicine

## 2015-04-22 ENCOUNTER — Ambulatory Visit (INDEPENDENT_AMBULATORY_CARE_PROVIDER_SITE_OTHER): Payer: Commercial Managed Care - HMO | Admitting: Family

## 2015-04-22 ENCOUNTER — Encounter: Payer: Self-pay | Admitting: Family

## 2015-04-22 VITALS — BP 132/80 | HR 120 | Temp 100.3°F | Ht 73.0 in | Wt 189.4 lb

## 2015-04-22 DIAGNOSIS — Z8619 Personal history of other infectious and parasitic diseases: Secondary | ICD-10-CM

## 2015-04-22 DIAGNOSIS — J069 Acute upper respiratory infection, unspecified: Secondary | ICD-10-CM

## 2015-04-22 DIAGNOSIS — J029 Acute pharyngitis, unspecified: Secondary | ICD-10-CM

## 2015-04-22 DIAGNOSIS — R509 Fever, unspecified: Secondary | ICD-10-CM | POA: Diagnosis not present

## 2015-04-22 LAB — POCT RAPID STREP A (OFFICE): Rapid Strep A Screen: NEGATIVE

## 2015-04-22 LAB — POCT INFLUENZA A/B
Influenza A, POC: NEGATIVE
Influenza B, POC: NEGATIVE

## 2015-04-22 MED ORDER — AMOXICILLIN-POT CLAVULANATE 875-125 MG PO TABS: 1.0000 | ORAL_TABLET | Freq: Two times a day (BID) | ORAL | Status: DC

## 2015-04-22 MED ORDER — RESTORA PO CAPS: 1.0000 | ORAL_CAPSULE | Freq: Every day | ORAL | Status: DC

## 2015-04-22 NOTE — Addendum Note (Signed)
Addended by: Selmer Dominion on: 04/22/2015 04:36 PM   Modules accepted: Orders

## 2015-04-22 NOTE — Patient Instructions (Signed)
Pharyngitis Pharyngitis is redness, pain, and swelling (inflammation) of your pharynx.  CAUSES  Pharyngitis is usually caused by infection. Most of the time, these infections are from viruses (viral) and are part of a cold. However, sometimes pharyngitis is caused by bacteria (bacterial). Pharyngitis can also be caused by allergies. Viral pharyngitis may be spread from person to person by coughing, sneezing, and personal items or utensils (cups, forks, spoons, toothbrushes). Bacterial pharyngitis may be spread from person to person by more intimate contact, such as kissing.  SIGNS AND SYMPTOMS  Symptoms of pharyngitis include:   Sore throat.   Tiredness (fatigue).   Low-grade fever.   Headache.  Joint pain and muscle aches.  Skin rashes.  Swollen lymph nodes.  Plaque-like film on throat or tonsils (often seen with bacterial pharyngitis). DIAGNOSIS  Your health care provider will ask you questions about your illness and your symptoms. Your medical history, along with a physical exam, is often all that is needed to diagnose pharyngitis. Sometimes, a rapid strep test is done. Other lab tests may also be done, depending on the suspected cause.  TREATMENT  Viral pharyngitis will usually get better in 3-4 days without the use of medicine. Bacterial pharyngitis is treated with medicines that kill germs (antibiotics).  HOME CARE INSTRUCTIONS   Drink enough water and fluids to keep your urine clear or pale yellow.   Only take over-the-counter or prescription medicines as directed by your health care provider:   If you are prescribed antibiotics, make sure you finish them even if you start to feel better.   Do not take aspirin.   Get lots of rest.   Gargle with 8 oz of salt water ( tsp of salt per 1 qt of water) as often as every 1-2 hours to soothe your throat.   Throat lozenges (if you are not at risk for choking) or sprays may be used to soothe your throat. SEEK MEDICAL  CARE IF:   You have large, tender lumps in your neck.  You have a rash.  You cough up green, yellow-brown, or bloody spit. SEEK IMMEDIATE MEDICAL CARE IF:   Your neck becomes stiff.  You drool or are unable to swallow liquids.  You vomit or are unable to keep medicines or liquids down.  You have severe pain that does not go away with the use of recommended medicines.  You have trouble breathing (not caused by a stuffy nose). MAKE SURE YOU:   Understand these instructions.  Will watch your condition.  Will get help right away if you are not doing well or get worse.   This information is not intended to replace advice given to you by your health care provider. Make sure you discuss any questions you have with your health care provider.   Document Released: 03/21/2005 Document Revised: 01/09/2013 Document Reviewed: 11/26/2012 Elsevier Interactive Patient Education 2016 [facility] meds as prescribed - Use a cool mist humidifier  -Use saline nose sprays frequently -Saline irrigations of the nose can be very helpful if done frequently.  * 4X daily for 1 week*  * Use of a nettie pot can be helpful with this. Follow directions with this* -Force fluids -For any cough or congestion  Use plain Mucinex- regular strength or max strength is fine   * Children- consult with Pharmacist for dosing -For fever or aces or pains- take tylenol or ibuprofen appropriate for age and weight.  * for fevers greater than 101 orally you may  alternate ibuprofen and tylenol every  3 hours. -Throat lozenges if help -New toothbrush in 3 days   Evelina Dun, FNP

## 2015-04-22 NOTE — Progress Notes (Addendum)
Subjective:    Patient ID: Walter [name], male    DOB: 26-Nov-1950, 65 y.o.   MRN: [id number]  Cough This is a new problem. Associated symptoms include a fever, headaches, a sore throat and wheezing. Pertinent negatives include no chest pain or ear pain.  Fever  This is a new problem. The current episode started in the past 7 days (Tuesday night). The problem occurs constantly. The problem has been waxing and waning. The maximum temperature noted was 100 to 100.9 F. Associated symptoms include congestion, coughing, headaches, muscle aches, nausea, sleepiness, a sore throat, vomiting and wheezing. Pertinent negatives include no abdominal pain, chest pain, diarrhea, ear pain or urinary pain. He has tried fluids and acetaminophen (aspirin) for the symptoms. The treatment provided mild relief.      Review of Systems  Constitutional: Positive for fever.  HENT: Positive for congestion and sore throat. Negative for ear pain.   Respiratory: Positive for cough and wheezing.   Cardiovascular: Negative.  Negative for chest pain.  Gastrointestinal: Positive for nausea and vomiting. Negative for abdominal pain and diarrhea.  Endocrine: Negative.   Genitourinary: Negative.  Negative for dysuria.  Musculoskeletal: Negative.   Neurological: Positive for headaches.  Hematological: Negative.   Psychiatric/Behavioral: Negative.   All other systems reviewed and are negative.      Objective:   Physical Exam  Constitutional: He is oriented to person, place, and time. He appears well-developed and well-nourished. He has a sickly appearance. He appears ill. No distress.  HENT:  Head: Normocephalic.  Right Ear: External ear normal.  Left Ear: External ear normal.  Nose: Nose normal.  Mouth/Throat: Oropharyngeal exudate present.  Eyes: Pupils are equal, round, and reactive to light. Right eye exhibits no discharge. Left eye exhibits no discharge.  Neck: Normal range of motion. Neck supple. No thyromegaly  present.  Cardiovascular: Normal rate, regular rhythm, normal heart sounds and intact distal pulses.   No murmur heard. Pulmonary/Chest: Effort normal and breath sounds normal. No respiratory distress. He has no wheezes.  Abdominal: Soft. Bowel sounds are normal. He exhibits no distension. There is no tenderness.  Musculoskeletal: Normal range of motion. He exhibits no edema or tenderness.  Neurological: He is alert and oriented to person, place, and time. He has normal reflexes. No cranial nerve deficit.  Skin: Skin is warm and dry. No rash noted. No erythema.  Psychiatric: He has a normal mood and affect. His behavior is normal. Judgment and thought content normal.  Vitals reviewed.    BP 132/80 mmHg  Pulse 120  Temp(Src) 100.3 F (37.9 C)  Ht 6\' 1"  (1.854 m)  Wt 189 lb 6.4 oz (85.911 kg)  BMI 24.99 kg/m2  SpO2 97%     Assessment & Plan:  1. Fever, unspecified fever cause - POCT Influenza A/B - POCT rapid strep A - amoxicillin-clavulanate (AUGMENTIN) 875-125 MG tablet; Take 1 tablet by mouth 2 (two) times daily.  Dispense: 14 tablet; Refill: 0  2. Acute pharyngitis, unspecified etiology -- Take meds as prescribed - Use a cool mist humidifier  -Use saline nose sprays frequently -Saline irrigations of the nose can be very helpful if done frequently.  * 4X daily for 1 week*  * Use of a nettie pot can be helpful with this. Follow directions with this* -Force fluids -For any cough or congestion  Use plain Mucinex- regular strength or max strength is fine   * Children- consult with Pharmacist for dosing -For fever or aces or pains- take  tylenol or ibuprofen appropriate for age and weight.  * for fevers greater than 101 orally you may alternate ibuprofen and tylenol every  3 hours. -Throat lozenges if help -New toothbrush in 3 days - amoxicillin-clavulanate (AUGMENTIN) 875-125 MG tablet; Take 1 tablet by mouth 2 (two) times daily.  Dispense: 14 tablet; Refill: 0  3. Acute  upper respiratory infection - amoxicillin-clavulanate (AUGMENTIN) 875-125 MG tablet; Take 1 tablet by mouth 2 (two) times daily.  Dispense: 14 tablet; Refill: 0   4. Hx of Clostridium difficile infection -Encouraged daily yogurt - Probiotic Product (RESTORA) CAPS; Take 1 capsule by mouth daily.  Dispense: 30 capsule; Refill: [facility], FNP

## 2015-04-22 NOTE — Addendum Note (Signed)
Addended by: Evelina Dun A on: 04/22/2015 11:51 AM   Modules accepted: Orders

## 2015-04-23 ENCOUNTER — Ambulatory Visit: Payer: Commercial Managed Care - HMO | Admitting: Family Medicine

## 2015-04-24 LAB — CULTURE, GROUP A STREP: Strep A Culture: POSITIVE — AB

## 2015-12-14 ENCOUNTER — Telehealth: Payer: Self-pay | Admitting: Family Medicine

## 2016-05-23 ENCOUNTER — Ambulatory Visit (INDEPENDENT_AMBULATORY_CARE_PROVIDER_SITE_OTHER): Payer: Medicare HMO | Admitting: Family Medicine

## 2016-05-23 ENCOUNTER — Encounter: Payer: Self-pay | Admitting: Family Medicine

## 2016-05-23 VITALS — BP 161/96 | HR 78 | Temp 97.0°F | Ht 73.0 in | Wt 198.2 lb

## 2016-05-23 DIAGNOSIS — H578 Other specified disorders of eye and adnexa: Secondary | ICD-10-CM | POA: Diagnosis not present

## 2016-05-23 DIAGNOSIS — H5789 Other specified disorders of eye and adnexa: Secondary | ICD-10-CM

## 2016-05-23 NOTE — Progress Notes (Signed)
   HPI  Patient presents today here with red eyes.  Patient explains that he's had "pinkeye" for about one week. He's been using an timeout 8 from his granddaughter for the same time. He states his vision is blurry. He reports severe eye irritation  He denies any injury. He has had a history of eye surgery a clinic in [place], he cannot remember the name of it.  He denies any eye pain.  Fever, chills, sweats.  PMH: Smoking status noted ROS: Per HPI  Objective: BP (!) 161/96   Pulse 78   Temp 97 F (36.1 C) (Oral)   Ht 6\' 1"  (1.854 m)   Wt 198 lb 3.2 oz (89.9 kg)   BMI 26.15 kg/m  Gen: NAD, alert, cooperative with exam HEENT: NCAT, of ear conjunctival injection with sparing of the limbus, difficult to appreciate pupillary response CV: RRR, good S1/S2, no murmur Resp: CTABL, no wheezes, non-labored  Ext: No edema, warm Neuro: Alert and oriented, No gross deficits  Assessment and plan:  # Red eyes Could be severe pinkeye, bacterial conjunctivitis, keratitis, allergic conjunctivitis to unknown eye drop, or allergic conjunctivitis to seasonal allergies Recommended quick evaluation by ophtho Difficult to appreciate pupillary response   Pt is going over to see ophtho right away   Laroy Apple, MD [facility] 05/23/2016, 3:11 PM

## 2016-06-28 IMAGING — CR DG CHEST 2V
2 series · 2 of 2 positions shown · non-contrast
Comparison: February 18, 2012

CLINICAL DATA: Chest pain for 2 days

EXAM:
CHEST  2 VIEW

[w chest pa]
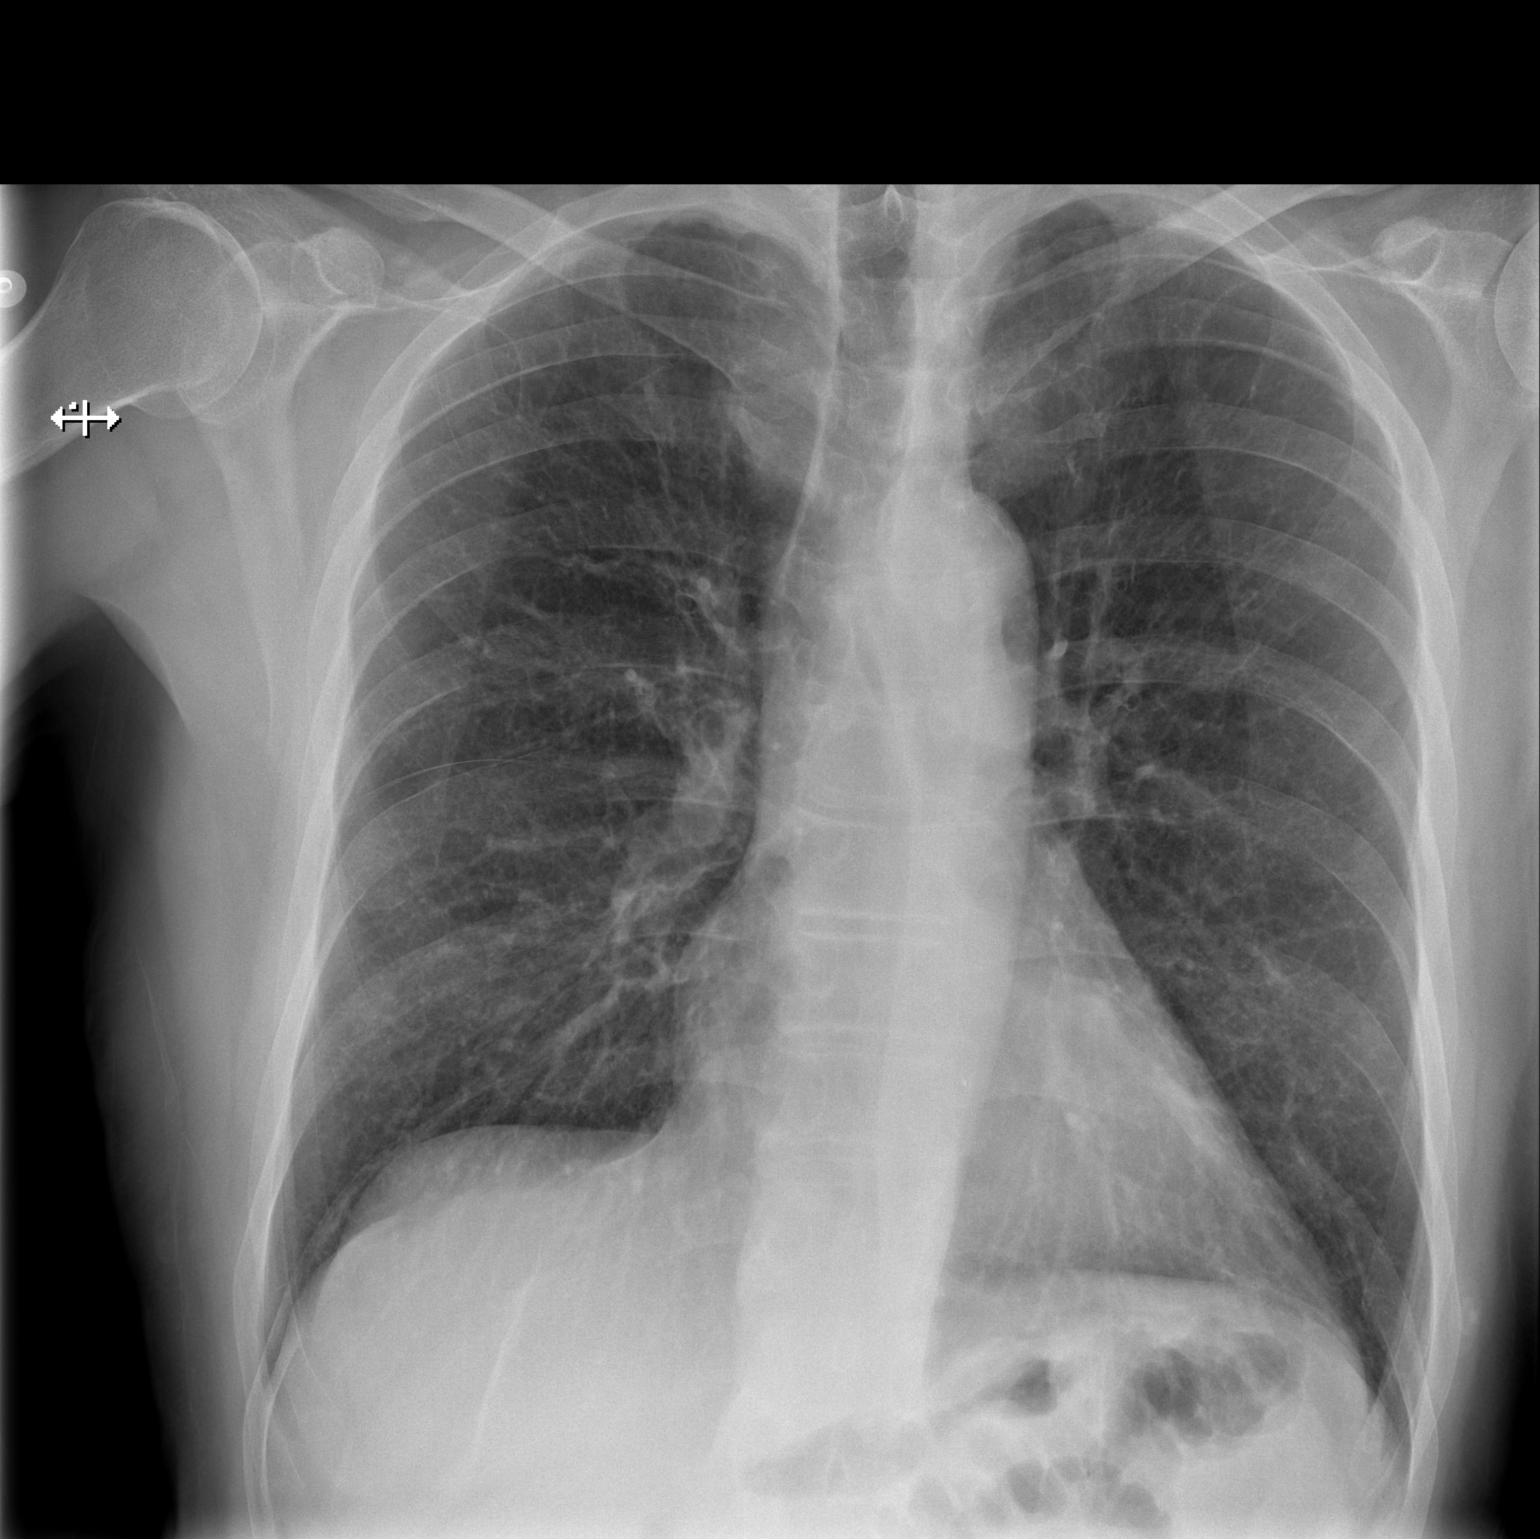

[w chest lat]
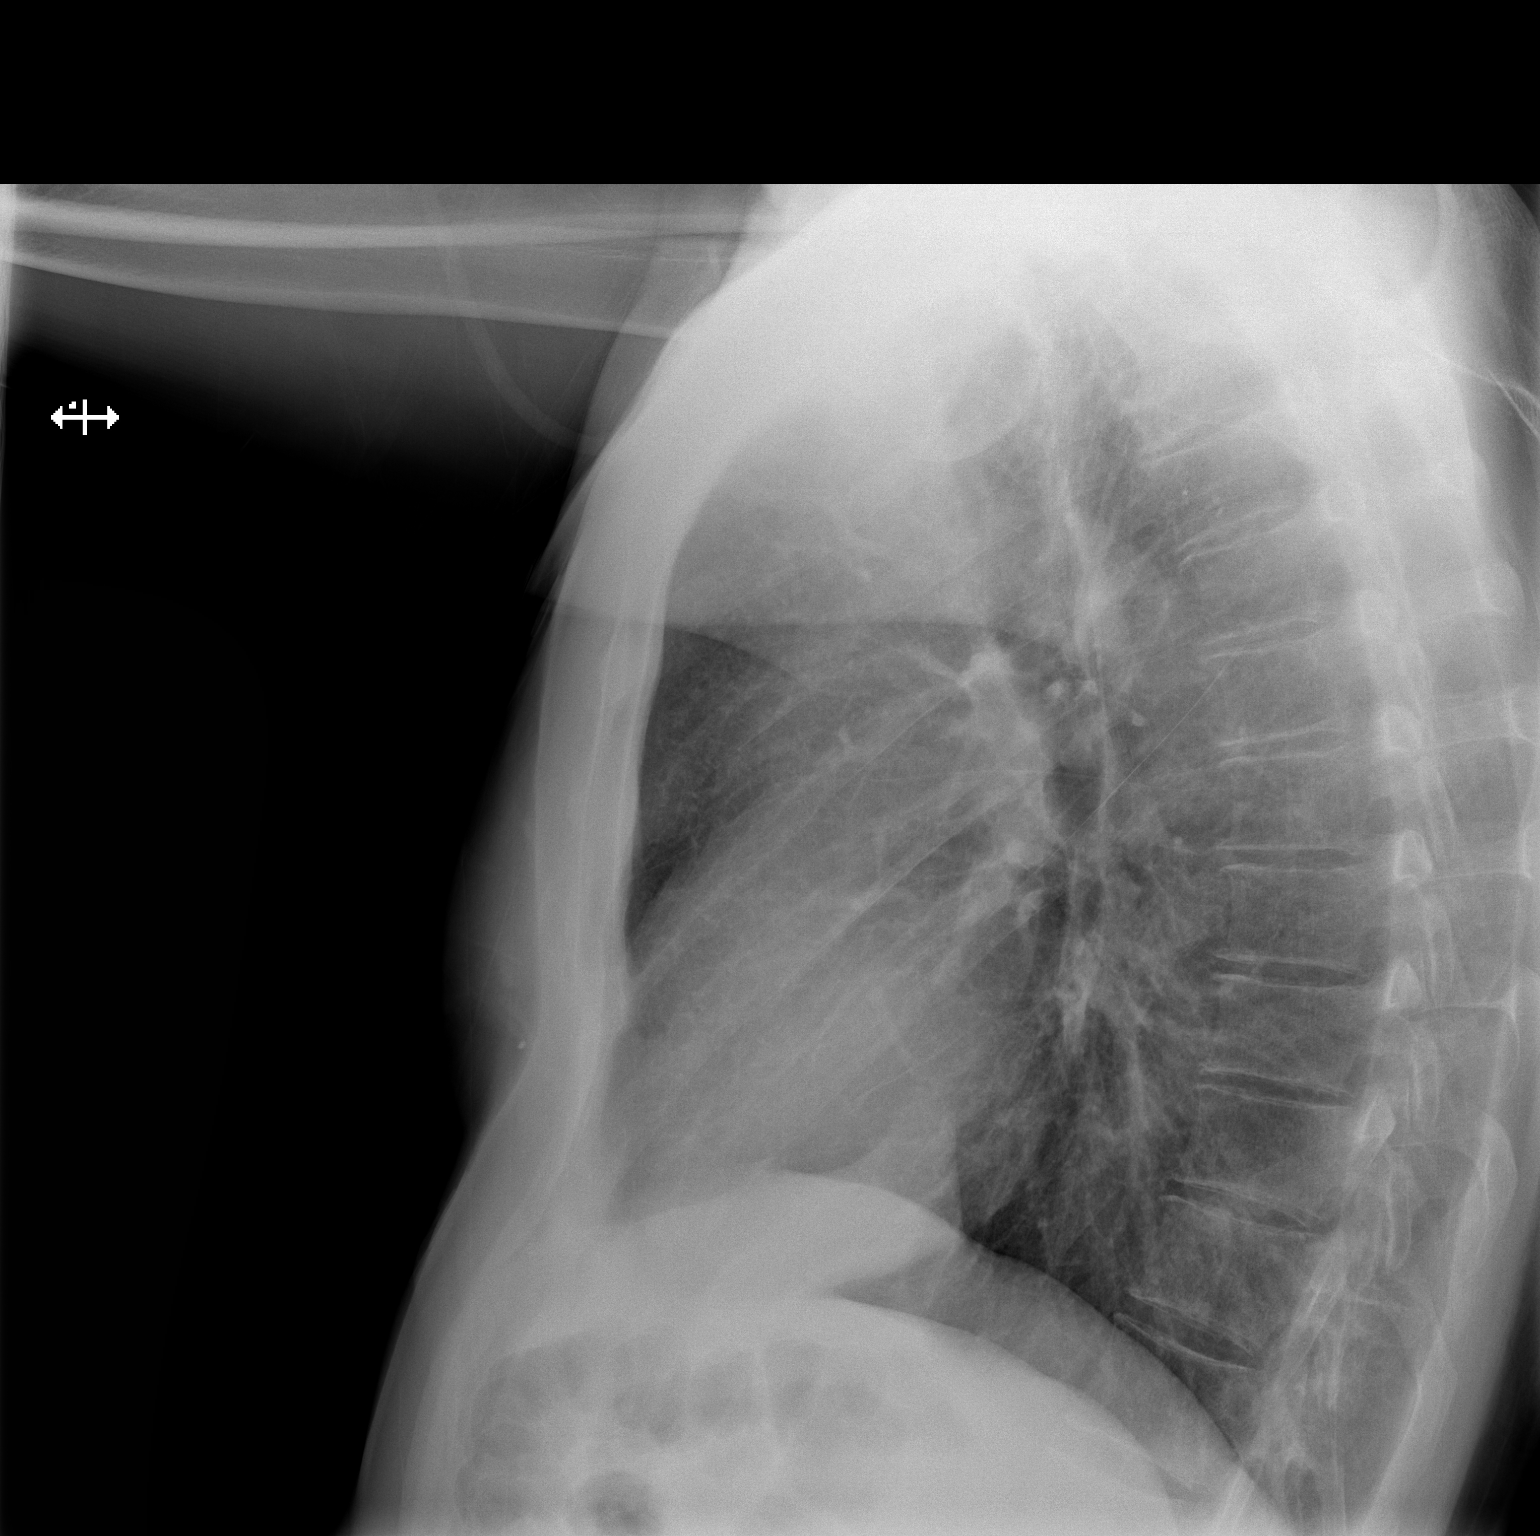

[2 of 2 positions shown; findings below may reference images not displayed]

FINDINGS: The heart size and mediastinal contours are within normal limits.
There is no focal infiltrate, pulmonary edema, or pleural effusion.
The visualized skeletal structures are stable.
IMPRESSION: No active cardiopulmonary disease.

## 2016-06-28 IMAGING — CT CT NECK W/ CM
3 of 5 series · 12 of 33 positions shown, 14 images · IV contrast (Iodine)
Comparison: None.

CLINICAL DATA: Right submandibular mass with pain and fever.
Initial encounter.

EXAM:
CT NECK WITH CONTRAST
TECHNIQUE: Multidetector CT imaging of the neck was performed using the
standard protocol following the bolus administration of intravenous
contrast.
CONTRAST:  75mL OMNIPAQUE IOHEXOL 300 MG/ML  SOLN

[Series 203: coronal, idose (2) · coronal · 0.45mm/px · 3 of 132 slices shown]
[im 27/132  bone]
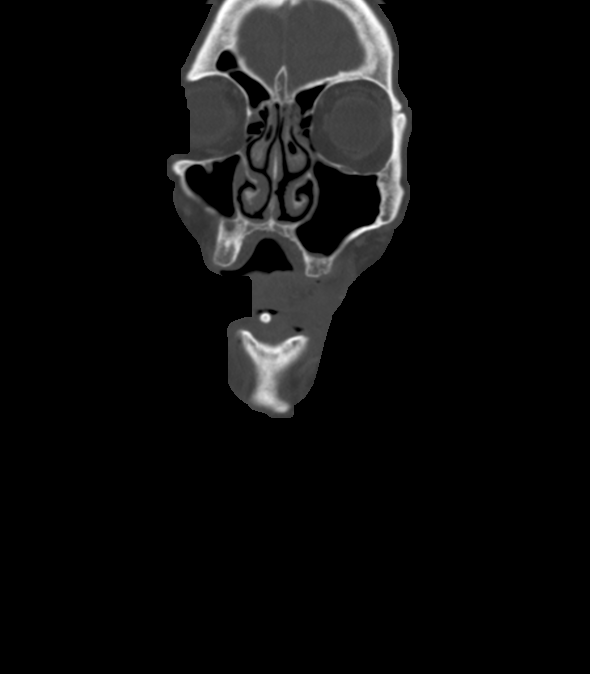
[im 53/132  bone]
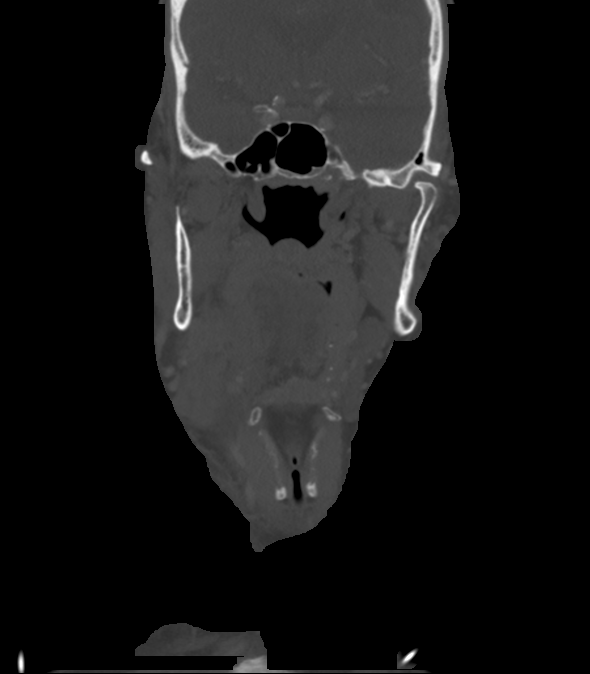
[im 79/132  bone]
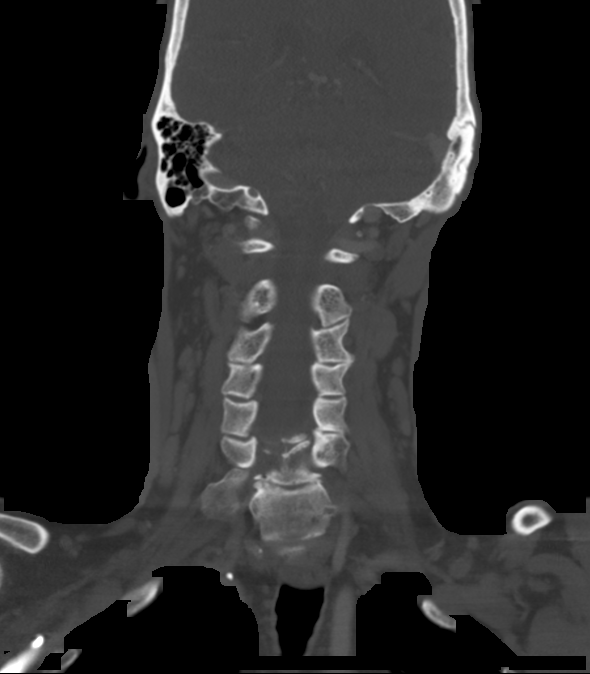

[Series 204: sagittal, idose (2) · sagittal · 0.45mm/px · 5 of 82 slices shown, 6 images]
[im 28/82  bone]
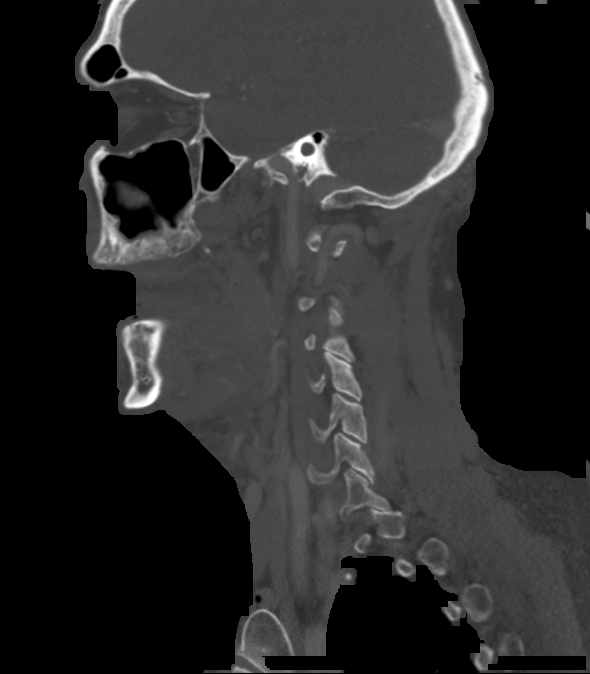
[im 34/82  bone]
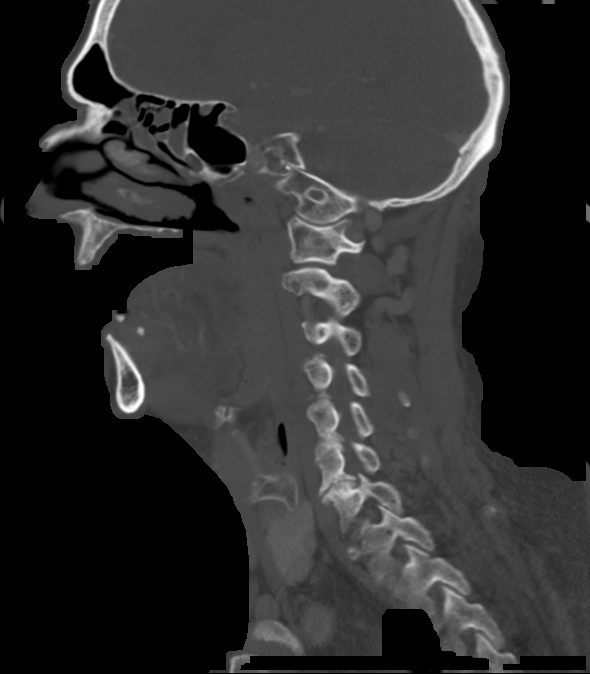
[im 41/82  soft-tissue]
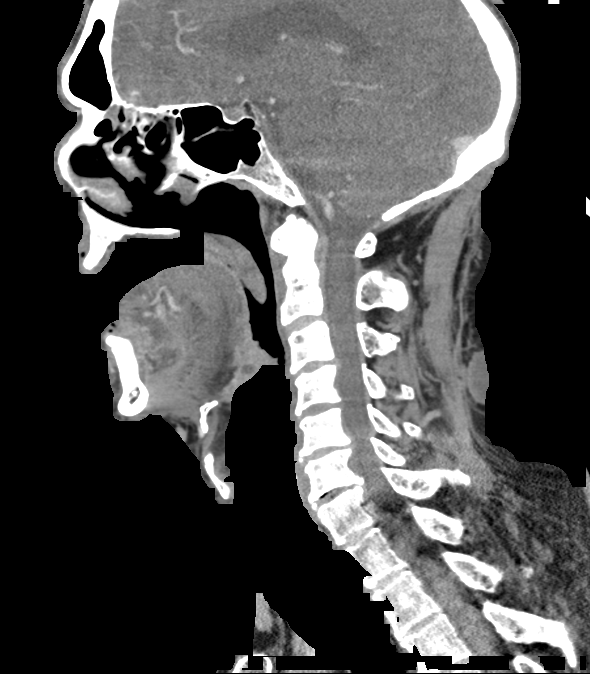
[im 41/82  bone]
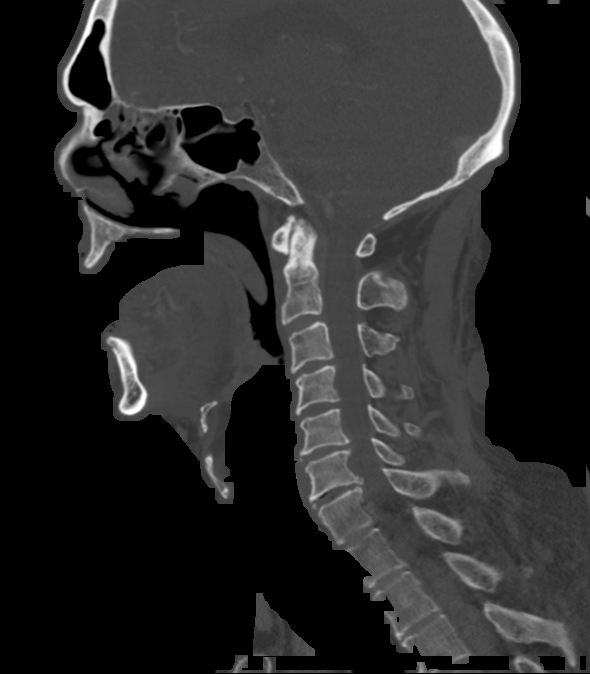
[im 48/82  bone]
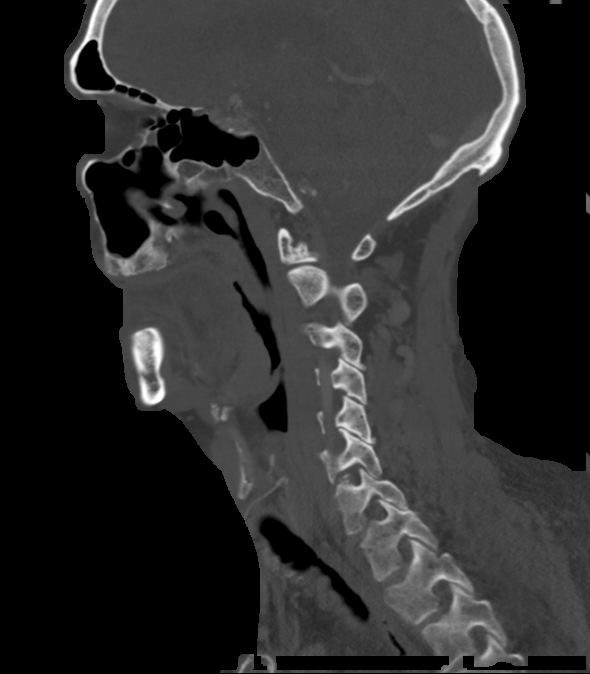
[im 55/82  bone]
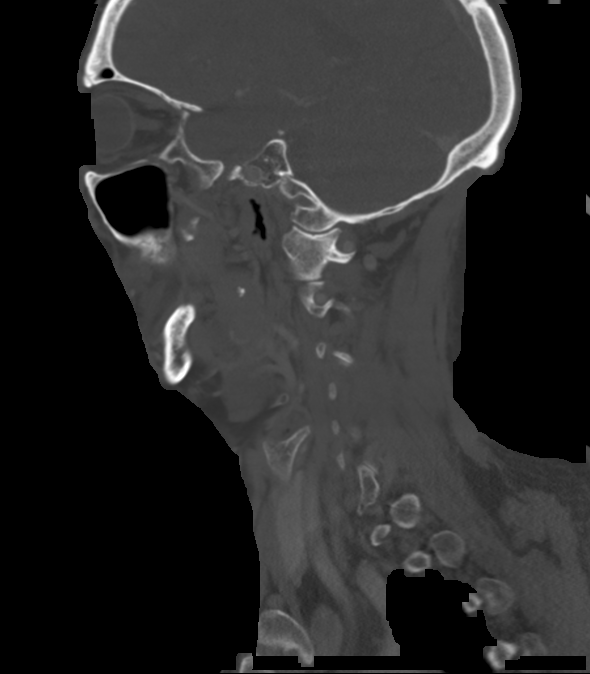

[Series 205: orthogonal, idose (2) · axial · 0.59mm/px · z∈[-100,+83]mm · 4 of 147 slices shown, 5 images]
[im 30/147  soft-tissue]
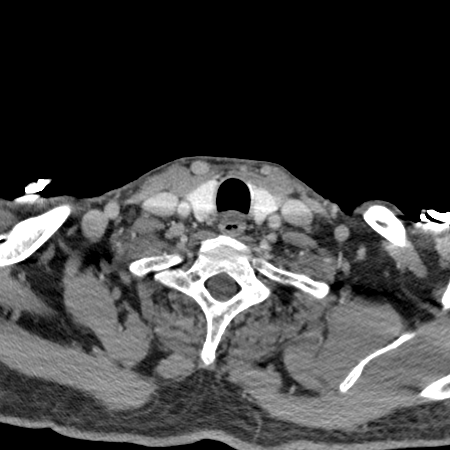
[im 30/147  bone]
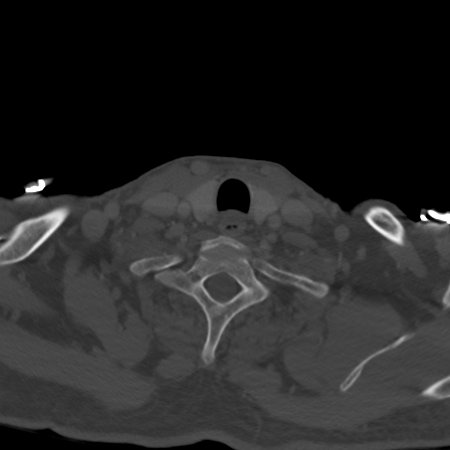
[im 59/147  bone]
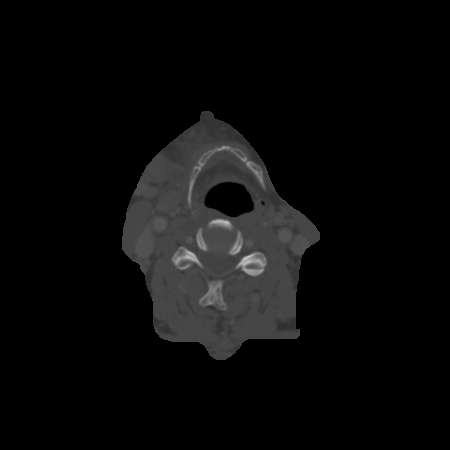
[im 88/147  bone]
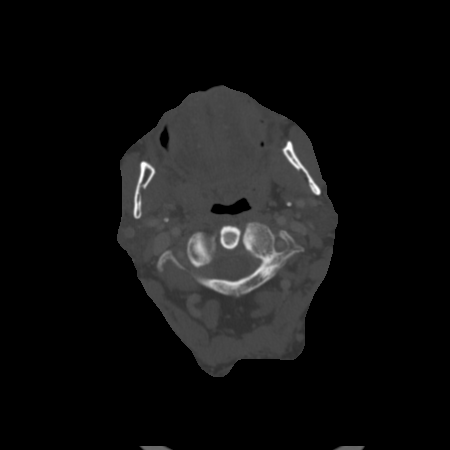
[im 117/147  bone]
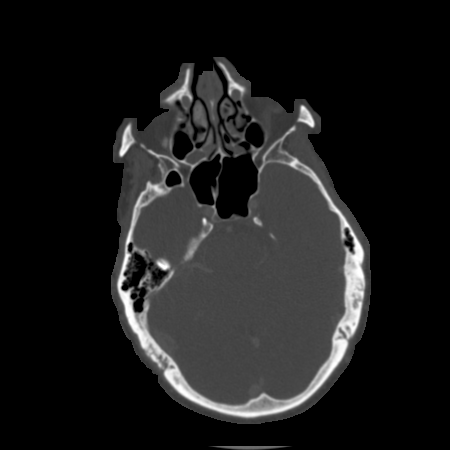

[12 of 33 positions shown; findings below may reference images not displayed]

FINDINGS: The right submandibular gland is enlarged and edematous with
surrounding fat infiltration. The submandibular duct is enlarged
secondary to 3 calculi in the distal duct. The largest stone
(located at the punctum) measures 6 mm. The most proximal stone is
within 1.5 cm of the ductal orifice. Small calculi are seen in the
bilateral parotid tails, without active inflammation. No abscess or
ranula.

Prominent palatine and lingual tonsillar tissue, with a presumed
inclusion cyst at the level of the central palatine tonsil. No
asymmetric thickening or enhancement suggestive of superimposed mass
lesion. The thyroid gland is unremarkable. There is mild enlargement
of right cervical chain nodes, presumably reactive. Patchy bilateral
ethmoid sinus inflammatory mucosal thickening.

In the midline posterior neck is a subcutaneous cystic mass, 21 mm
in diameter, consistent with dermal inclusion cyst.

Confluent periventricular white matter low-attenuation is
incidentally seen, with remote appearing lacunar infarct in the left
lateral lenticulostriate distribution. Cervical carotid
atherosclerosis without significant stenosis.
IMPRESSION: 1. Right submandibular sialoadenitis secondary to 3 calculi
obstructing the distal duct.
2. Bilateral parotid silalolithiasis.
3. Palatine and lingual tonsillar enlargement.

## 2016-09-05 ENCOUNTER — Telehealth: Payer: Self-pay | Admitting: Family Medicine

## 2016-09-08 NOTE — Telephone Encounter (Signed)
Denied.

## 2017-03-27 DIAGNOSIS — M546 Pain in thoracic spine: Secondary | ICD-10-CM | POA: Diagnosis not present

## 2017-03-27 DIAGNOSIS — S299XXA Unspecified injury of thorax, initial encounter: Secondary | ICD-10-CM | POA: Diagnosis not present

## 2017-03-27 DIAGNOSIS — S335XXA Sprain of ligaments of lumbar spine, initial encounter: Secondary | ICD-10-CM | POA: Diagnosis not present

## 2017-03-27 DIAGNOSIS — F17200 Nicotine dependence, unspecified, uncomplicated: Secondary | ICD-10-CM | POA: Diagnosis not present

## 2017-03-27 DIAGNOSIS — S339XXA Sprain of unspecified parts of lumbar spine and pelvis, initial encounter: Secondary | ICD-10-CM | POA: Diagnosis not present

## 2017-03-27 DIAGNOSIS — M545 Low back pain: Secondary | ICD-10-CM | POA: Diagnosis not present

## 2017-03-27 DIAGNOSIS — W01198A Fall on same level from slipping, tripping and stumbling with subsequent striking against other object, initial encounter: Secondary | ICD-10-CM | POA: Diagnosis not present

## 2017-03-27 DIAGNOSIS — S300XXA Contusion of lower back and pelvis, initial encounter: Secondary | ICD-10-CM | POA: Diagnosis not present

## 2017-08-15 ENCOUNTER — Encounter: Payer: Self-pay | Admitting: Family Medicine

## 2017-08-15 ENCOUNTER — Ambulatory Visit (INDEPENDENT_AMBULATORY_CARE_PROVIDER_SITE_OTHER): Payer: Medicare HMO | Admitting: Family Medicine

## 2017-08-15 VITALS — BP 172/95 | HR 81 | Temp 96.7°F | Ht 73.0 in | Wt 176.0 lb

## 2017-08-15 DIAGNOSIS — Z8673 Personal history of transient ischemic attack (TIA), and cerebral infarction without residual deficits: Secondary | ICD-10-CM | POA: Diagnosis not present

## 2017-08-15 DIAGNOSIS — Z720 Tobacco use: Secondary | ICD-10-CM | POA: Diagnosis not present

## 2017-08-15 DIAGNOSIS — F321 Major depressive disorder, single episode, moderate: Secondary | ICD-10-CM

## 2017-08-15 DIAGNOSIS — I10 Essential (primary) hypertension: Secondary | ICD-10-CM | POA: Diagnosis not present

## 2017-08-15 MED ORDER — BUPROPION HCL ER (XL) 150 MG PO TB24: 150.0000 mg | ORAL_TABLET | Freq: Every day | ORAL | 1 refills | Status: DC

## 2017-08-15 MED ORDER — AMLODIPINE BESYLATE 5 MG PO TABS: 5.0000 mg | ORAL_TABLET | Freq: Every day | ORAL | 3 refills | Status: DC

## 2017-08-15 NOTE — Patient Instructions (Addendum)
Great to see you!  Start amlodipine once daily Start a 325 mg aspirin daily Start Wellbutrin daily ( call if this is expensive)

## 2017-08-15 NOTE — Progress Notes (Signed)
   HPI  Patient presents today here for follow-up.  Patient has history of stroke with evidence of an MRI of old lacunar strokes. His wife believes that he had a TIA or another stroke about 1 year ago.  He had an episode of several hours of slurred speech.  His speech became normal again after that.  He refused to seek local health at that time.  He is a smoker, he is not interested in quitting.  He is also had crying spells, decreased appetite, and weight loss since that last stroke.  He has not been taking aspirin daily.  He stopped all of his medications.  PMH: Smoking status noted ROS: Per HPI  Objective: BP (!) 172/95   Pulse 81   Temp (!) 96.7 F (35.9 C) (Oral)   Ht 6\' 1"  (1.854 m)   Wt 176 lb (79.8 kg)   BMI 23.22 kg/m  Gen: NAD, alert, cooperative with exam HEENT: NCAT, EOMI, PERRLA CV: RRR, good S1/S2, no murmur Resp: CTABL, no wheezes, non-labored Ext: No edema, warm Neuro: Alert and oriented, strength 4/5 and sensation intact bilateral lower and upper extremities  Assessment and plan:  #History of CVA With possible repeat stroke about a year ago Start aspirin, control hypertension Labs Stop smoking  #Hypertension Uncontrolled Start amlodipine 5 mg Follow-up 3 to 4 weeks  #Depression Starting Wellbutrin, patient is sleeping a lot during the day and sleeping well at night. Has decreased appetite which is most likely the source of weight loss, however if his appetite and weight does not stabilize I would consider full work-up for occult neoplasia. He does not want a colonoscopy currently  Smoking CT low dose screening   Laroy Apple, MD [facility] 08/15/2017, 2:30 PM

## 2017-08-16 LAB — CBC WITH DIFFERENTIAL/PLATELET
BASOS: 0 %
Basophils Absolute: 0 10*3/uL (ref 0.0–0.2)
EOS (ABSOLUTE): 0.2 10*3/uL (ref 0.0–0.4)
EOS: 2 %
HEMATOCRIT: 48.4 % (ref 37.5–51.0)
Hemoglobin: 16.4 g/dL (ref 13.0–17.7)
IMMATURE GRANS (ABS): 0.1 10*3/uL (ref 0.0–0.1)
Immature Granulocytes: 1 %
Lymphocytes Absolute: 3.1 10*3/uL (ref 0.7–3.1)
Lymphs: 31 %
MCH: 30.3 pg (ref 26.6–33.0)
MCHC: 33.9 g/dL (ref 31.5–35.7)
MCV: 89 fL (ref 79–97)
MONOCYTES: 8 %
Monocytes Absolute: 0.8 10*3/uL (ref 0.1–0.9)
NEUTROS PCT: 58 %
Neutrophils Absolute: 5.7 10*3/uL (ref 1.4–7.0)
PLATELETS: 253 10*3/uL (ref 150–379)
RBC: 5.42 x10E6/uL (ref 4.14–5.80)
RDW: 14.4 % (ref 12.3–15.4)
WBC: 9.8 10*3/uL (ref 3.4–10.8)

## 2017-08-16 LAB — CMP14+EGFR
ALK PHOS: 100 IU/L (ref 39–117)
ALT: 14 IU/L (ref 0–44)
AST: 12 IU/L (ref 0–40)
Albumin/Globulin Ratio: 1.8 (ref 1.2–2.2)
Albumin: 4.5 g/dL (ref 3.6–4.8)
BUN/Creatinine Ratio: 11 (ref 10–24)
BUN: 11 mg/dL (ref 8–27)
Bilirubin Total: 0.5 mg/dL (ref 0.0–1.2)
CALCIUM: 9.7 mg/dL (ref 8.6–10.2)
CO2: 22 mmol/L (ref 20–29)
CREATININE: 0.96 mg/dL (ref 0.76–1.27)
Chloride: 102 mmol/L (ref 96–106)
GFR calc Af Amer: 94 mL/min/{1.73_m2} (ref >59)
GFR calc non Af Amer: 81 mL/min/{1.73_m2} (ref >59)
GLOBULIN, TOTAL: 2.5 g/dL (ref 1.5–4.5)
Glucose: 72 mg/dL (ref 65–99)
Potassium: 4 mmol/L (ref 3.5–5.2)
Sodium: 141 mmol/L (ref 134–144)
Total Protein: 7 g/dL (ref 6.0–8.5)

## 2017-08-16 LAB — LIPID PANEL
Chol/HDL Ratio: 8.1 ratio — ABNORMAL HIGH (ref 0.0–5.0)
Cholesterol, Total: 218 mg/dL — ABNORMAL HIGH (ref 100–199)
HDL: 27 mg/dL — ABNORMAL LOW (ref >39)
LDL Calculated: 156 mg/dL — ABNORMAL HIGH (ref 0–99)
Triglycerides: 173 mg/dL — ABNORMAL HIGH (ref 0–149)
VLDL CHOLESTEROL CAL: 35 mg/dL (ref 5–40)

## 2017-08-16 LAB — TSH: TSH: 3.86 u[IU]/mL (ref 0.450–4.500)

## 2017-08-17 ENCOUNTER — Telehealth: Payer: Self-pay | Admitting: Family Medicine

## 2017-08-19 DIAGNOSIS — I252 Old myocardial infarction: Secondary | ICD-10-CM | POA: Diagnosis not present

## 2017-08-19 DIAGNOSIS — G8191 Hemiplegia, unspecified affecting right dominant side: Secondary | ICD-10-CM | POA: Diagnosis not present

## 2017-08-19 DIAGNOSIS — I639 Cerebral infarction, unspecified: Secondary | ICD-10-CM | POA: Diagnosis not present

## 2017-08-19 DIAGNOSIS — Z79899 Other long term (current) drug therapy: Secondary | ICD-10-CM | POA: Diagnosis not present

## 2017-08-19 DIAGNOSIS — F482 Pseudobulbar affect: Secondary | ICD-10-CM | POA: Diagnosis not present

## 2017-08-19 DIAGNOSIS — I69398 Other sequelae of cerebral infarction: Secondary | ICD-10-CM | POA: Diagnosis not present

## 2017-08-19 DIAGNOSIS — I10 Essential (primary) hypertension: Secondary | ICD-10-CM | POA: Diagnosis not present

## 2017-08-19 DIAGNOSIS — Z720 Tobacco use: Secondary | ICD-10-CM | POA: Diagnosis not present

## 2017-08-19 DIAGNOSIS — I6521 Occlusion and stenosis of right carotid artery: Secondary | ICD-10-CM | POA: Diagnosis not present

## 2017-08-19 DIAGNOSIS — Z7982 Long term (current) use of aspirin: Secondary | ICD-10-CM | POA: Diagnosis not present

## 2017-08-19 DIAGNOSIS — M6281 Muscle weakness (generalized): Secondary | ICD-10-CM | POA: Diagnosis not present

## 2017-08-19 DIAGNOSIS — E785 Hyperlipidemia, unspecified: Secondary | ICD-10-CM | POA: Diagnosis not present

## 2017-08-23 DIAGNOSIS — I69351 Hemiplegia and hemiparesis following cerebral infarction affecting right dominant side: Secondary | ICD-10-CM | POA: Diagnosis not present

## 2017-08-23 DIAGNOSIS — F329 Major depressive disorder, single episode, unspecified: Secondary | ICD-10-CM | POA: Diagnosis not present

## 2017-08-23 DIAGNOSIS — F482 Pseudobulbar affect: Secondary | ICD-10-CM | POA: Diagnosis not present

## 2017-08-23 DIAGNOSIS — G1229 Other motor neuron disease: Secondary | ICD-10-CM | POA: Diagnosis not present

## 2017-08-23 DIAGNOSIS — I10 Essential (primary) hypertension: Secondary | ICD-10-CM | POA: Diagnosis not present

## 2017-08-23 DIAGNOSIS — J439 Emphysema, unspecified: Secondary | ICD-10-CM | POA: Diagnosis not present

## 2017-08-23 DIAGNOSIS — I69322 Dysarthria following cerebral infarction: Secondary | ICD-10-CM | POA: Diagnosis not present

## 2017-08-23 DIAGNOSIS — I69398 Other sequelae of cerebral infarction: Secondary | ICD-10-CM | POA: Diagnosis not present

## 2017-08-23 DIAGNOSIS — I252 Old myocardial infarction: Secondary | ICD-10-CM | POA: Diagnosis not present

## 2017-08-24 DIAGNOSIS — I69398 Other sequelae of cerebral infarction: Secondary | ICD-10-CM | POA: Diagnosis not present

## 2017-08-24 DIAGNOSIS — F329 Major depressive disorder, single episode, unspecified: Secondary | ICD-10-CM | POA: Diagnosis not present

## 2017-08-24 DIAGNOSIS — I69322 Dysarthria following cerebral infarction: Secondary | ICD-10-CM | POA: Diagnosis not present

## 2017-08-24 DIAGNOSIS — I10 Essential (primary) hypertension: Secondary | ICD-10-CM | POA: Diagnosis not present

## 2017-08-24 DIAGNOSIS — J439 Emphysema, unspecified: Secondary | ICD-10-CM | POA: Diagnosis not present

## 2017-08-24 DIAGNOSIS — I252 Old myocardial infarction: Secondary | ICD-10-CM | POA: Diagnosis not present

## 2017-08-24 DIAGNOSIS — F482 Pseudobulbar affect: Secondary | ICD-10-CM | POA: Diagnosis not present

## 2017-08-24 DIAGNOSIS — I69351 Hemiplegia and hemiparesis following cerebral infarction affecting right dominant side: Secondary | ICD-10-CM | POA: Diagnosis not present

## 2017-08-24 DIAGNOSIS — G1229 Other motor neuron disease: Secondary | ICD-10-CM | POA: Diagnosis not present

## 2017-08-25 DIAGNOSIS — I69351 Hemiplegia and hemiparesis following cerebral infarction affecting right dominant side: Secondary | ICD-10-CM | POA: Diagnosis not present

## 2017-08-25 DIAGNOSIS — F482 Pseudobulbar affect: Secondary | ICD-10-CM | POA: Diagnosis not present

## 2017-08-25 DIAGNOSIS — I69398 Other sequelae of cerebral infarction: Secondary | ICD-10-CM | POA: Diagnosis not present

## 2017-08-25 DIAGNOSIS — I10 Essential (primary) hypertension: Secondary | ICD-10-CM | POA: Diagnosis not present

## 2017-08-25 DIAGNOSIS — I69322 Dysarthria following cerebral infarction: Secondary | ICD-10-CM | POA: Diagnosis not present

## 2017-08-25 DIAGNOSIS — J439 Emphysema, unspecified: Secondary | ICD-10-CM | POA: Diagnosis not present

## 2017-08-25 DIAGNOSIS — G1229 Other motor neuron disease: Secondary | ICD-10-CM | POA: Diagnosis not present

## 2017-08-25 DIAGNOSIS — F329 Major depressive disorder, single episode, unspecified: Secondary | ICD-10-CM | POA: Diagnosis not present

## 2017-08-25 DIAGNOSIS — I252 Old myocardial infarction: Secondary | ICD-10-CM | POA: Diagnosis not present

## 2017-08-28 DIAGNOSIS — I69398 Other sequelae of cerebral infarction: Secondary | ICD-10-CM | POA: Diagnosis not present

## 2017-08-28 DIAGNOSIS — I69351 Hemiplegia and hemiparesis following cerebral infarction affecting right dominant side: Secondary | ICD-10-CM | POA: Diagnosis not present

## 2017-08-28 DIAGNOSIS — I69322 Dysarthria following cerebral infarction: Secondary | ICD-10-CM | POA: Diagnosis not present

## 2017-08-28 DIAGNOSIS — I10 Essential (primary) hypertension: Secondary | ICD-10-CM | POA: Diagnosis not present

## 2017-08-28 DIAGNOSIS — F482 Pseudobulbar affect: Secondary | ICD-10-CM | POA: Diagnosis not present

## 2017-08-28 DIAGNOSIS — J439 Emphysema, unspecified: Secondary | ICD-10-CM | POA: Diagnosis not present

## 2017-08-28 DIAGNOSIS — I252 Old myocardial infarction: Secondary | ICD-10-CM | POA: Diagnosis not present

## 2017-08-28 DIAGNOSIS — F329 Major depressive disorder, single episode, unspecified: Secondary | ICD-10-CM | POA: Diagnosis not present

## 2017-08-28 DIAGNOSIS — G1229 Other motor neuron disease: Secondary | ICD-10-CM | POA: Diagnosis not present

## 2017-08-29 DIAGNOSIS — I69322 Dysarthria following cerebral infarction: Secondary | ICD-10-CM | POA: Diagnosis not present

## 2017-08-29 DIAGNOSIS — I69351 Hemiplegia and hemiparesis following cerebral infarction affecting right dominant side: Secondary | ICD-10-CM | POA: Diagnosis not present

## 2017-08-29 DIAGNOSIS — F482 Pseudobulbar affect: Secondary | ICD-10-CM | POA: Diagnosis not present

## 2017-08-29 DIAGNOSIS — I252 Old myocardial infarction: Secondary | ICD-10-CM | POA: Diagnosis not present

## 2017-08-29 DIAGNOSIS — J439 Emphysema, unspecified: Secondary | ICD-10-CM | POA: Diagnosis not present

## 2017-08-29 DIAGNOSIS — I69398 Other sequelae of cerebral infarction: Secondary | ICD-10-CM | POA: Diagnosis not present

## 2017-08-29 DIAGNOSIS — G1229 Other motor neuron disease: Secondary | ICD-10-CM | POA: Diagnosis not present

## 2017-08-29 DIAGNOSIS — I10 Essential (primary) hypertension: Secondary | ICD-10-CM | POA: Diagnosis not present

## 2017-08-29 DIAGNOSIS — F329 Major depressive disorder, single episode, unspecified: Secondary | ICD-10-CM | POA: Diagnosis not present

## 2017-08-30 DIAGNOSIS — F329 Major depressive disorder, single episode, unspecified: Secondary | ICD-10-CM | POA: Diagnosis not present

## 2017-08-30 DIAGNOSIS — J439 Emphysema, unspecified: Secondary | ICD-10-CM | POA: Diagnosis not present

## 2017-08-30 DIAGNOSIS — I69322 Dysarthria following cerebral infarction: Secondary | ICD-10-CM | POA: Diagnosis not present

## 2017-08-30 DIAGNOSIS — I69351 Hemiplegia and hemiparesis following cerebral infarction affecting right dominant side: Secondary | ICD-10-CM | POA: Diagnosis not present

## 2017-08-30 DIAGNOSIS — I10 Essential (primary) hypertension: Secondary | ICD-10-CM | POA: Diagnosis not present

## 2017-08-30 DIAGNOSIS — I69398 Other sequelae of cerebral infarction: Secondary | ICD-10-CM | POA: Diagnosis not present

## 2017-08-30 DIAGNOSIS — G1229 Other motor neuron disease: Secondary | ICD-10-CM | POA: Diagnosis not present

## 2017-08-30 DIAGNOSIS — I252 Old myocardial infarction: Secondary | ICD-10-CM | POA: Diagnosis not present

## 2017-08-30 DIAGNOSIS — F482 Pseudobulbar affect: Secondary | ICD-10-CM | POA: Diagnosis not present

## 2017-08-31 DIAGNOSIS — I10 Essential (primary) hypertension: Secondary | ICD-10-CM | POA: Diagnosis not present

## 2017-08-31 DIAGNOSIS — F482 Pseudobulbar affect: Secondary | ICD-10-CM | POA: Diagnosis not present

## 2017-08-31 DIAGNOSIS — I69322 Dysarthria following cerebral infarction: Secondary | ICD-10-CM | POA: Diagnosis not present

## 2017-08-31 DIAGNOSIS — J439 Emphysema, unspecified: Secondary | ICD-10-CM | POA: Diagnosis not present

## 2017-08-31 DIAGNOSIS — I252 Old myocardial infarction: Secondary | ICD-10-CM | POA: Diagnosis not present

## 2017-08-31 DIAGNOSIS — F329 Major depressive disorder, single episode, unspecified: Secondary | ICD-10-CM | POA: Diagnosis not present

## 2017-08-31 DIAGNOSIS — I69398 Other sequelae of cerebral infarction: Secondary | ICD-10-CM | POA: Diagnosis not present

## 2017-08-31 DIAGNOSIS — I69351 Hemiplegia and hemiparesis following cerebral infarction affecting right dominant side: Secondary | ICD-10-CM | POA: Diagnosis not present

## 2017-08-31 DIAGNOSIS — G1229 Other motor neuron disease: Secondary | ICD-10-CM | POA: Diagnosis not present

## 2017-09-01 DIAGNOSIS — I69398 Other sequelae of cerebral infarction: Secondary | ICD-10-CM | POA: Diagnosis not present

## 2017-09-01 DIAGNOSIS — I69322 Dysarthria following cerebral infarction: Secondary | ICD-10-CM | POA: Diagnosis not present

## 2017-09-01 DIAGNOSIS — G1229 Other motor neuron disease: Secondary | ICD-10-CM | POA: Diagnosis not present

## 2017-09-01 DIAGNOSIS — F482 Pseudobulbar affect: Secondary | ICD-10-CM | POA: Diagnosis not present

## 2017-09-01 DIAGNOSIS — I252 Old myocardial infarction: Secondary | ICD-10-CM | POA: Diagnosis not present

## 2017-09-01 DIAGNOSIS — F329 Major depressive disorder, single episode, unspecified: Secondary | ICD-10-CM | POA: Diagnosis not present

## 2017-09-01 DIAGNOSIS — I69351 Hemiplegia and hemiparesis following cerebral infarction affecting right dominant side: Secondary | ICD-10-CM | POA: Diagnosis not present

## 2017-09-01 DIAGNOSIS — I10 Essential (primary) hypertension: Secondary | ICD-10-CM | POA: Diagnosis not present

## 2017-09-01 DIAGNOSIS — J439 Emphysema, unspecified: Secondary | ICD-10-CM | POA: Diagnosis not present

## 2017-09-04 DIAGNOSIS — J439 Emphysema, unspecified: Secondary | ICD-10-CM | POA: Diagnosis not present

## 2017-09-04 DIAGNOSIS — F329 Major depressive disorder, single episode, unspecified: Secondary | ICD-10-CM | POA: Diagnosis not present

## 2017-09-04 DIAGNOSIS — I69398 Other sequelae of cerebral infarction: Secondary | ICD-10-CM | POA: Diagnosis not present

## 2017-09-04 DIAGNOSIS — I69322 Dysarthria following cerebral infarction: Secondary | ICD-10-CM | POA: Diagnosis not present

## 2017-09-04 DIAGNOSIS — G1229 Other motor neuron disease: Secondary | ICD-10-CM | POA: Diagnosis not present

## 2017-09-04 DIAGNOSIS — I69351 Hemiplegia and hemiparesis following cerebral infarction affecting right dominant side: Secondary | ICD-10-CM | POA: Diagnosis not present

## 2017-09-04 DIAGNOSIS — F482 Pseudobulbar affect: Secondary | ICD-10-CM | POA: Diagnosis not present

## 2017-09-04 DIAGNOSIS — I252 Old myocardial infarction: Secondary | ICD-10-CM | POA: Diagnosis not present

## 2017-09-04 DIAGNOSIS — I10 Essential (primary) hypertension: Secondary | ICD-10-CM | POA: Diagnosis not present

## 2017-09-06 DIAGNOSIS — I69322 Dysarthria following cerebral infarction: Secondary | ICD-10-CM | POA: Diagnosis not present

## 2017-09-06 DIAGNOSIS — J439 Emphysema, unspecified: Secondary | ICD-10-CM | POA: Diagnosis not present

## 2017-09-06 DIAGNOSIS — I252 Old myocardial infarction: Secondary | ICD-10-CM | POA: Diagnosis not present

## 2017-09-06 DIAGNOSIS — G1229 Other motor neuron disease: Secondary | ICD-10-CM | POA: Diagnosis not present

## 2017-09-06 DIAGNOSIS — F482 Pseudobulbar affect: Secondary | ICD-10-CM | POA: Diagnosis not present

## 2017-09-06 DIAGNOSIS — I69351 Hemiplegia and hemiparesis following cerebral infarction affecting right dominant side: Secondary | ICD-10-CM | POA: Diagnosis not present

## 2017-09-06 DIAGNOSIS — F329 Major depressive disorder, single episode, unspecified: Secondary | ICD-10-CM | POA: Diagnosis not present

## 2017-09-06 DIAGNOSIS — I69398 Other sequelae of cerebral infarction: Secondary | ICD-10-CM | POA: Diagnosis not present

## 2017-09-06 DIAGNOSIS — I10 Essential (primary) hypertension: Secondary | ICD-10-CM | POA: Diagnosis not present

## 2017-09-07 DIAGNOSIS — I252 Old myocardial infarction: Secondary | ICD-10-CM | POA: Diagnosis not present

## 2017-09-07 DIAGNOSIS — F482 Pseudobulbar affect: Secondary | ICD-10-CM | POA: Diagnosis not present

## 2017-09-07 DIAGNOSIS — I10 Essential (primary) hypertension: Secondary | ICD-10-CM | POA: Diagnosis not present

## 2017-09-07 DIAGNOSIS — I69351 Hemiplegia and hemiparesis following cerebral infarction affecting right dominant side: Secondary | ICD-10-CM | POA: Diagnosis not present

## 2017-09-07 DIAGNOSIS — F329 Major depressive disorder, single episode, unspecified: Secondary | ICD-10-CM | POA: Diagnosis not present

## 2017-09-07 DIAGNOSIS — I69322 Dysarthria following cerebral infarction: Secondary | ICD-10-CM | POA: Diagnosis not present

## 2017-09-07 DIAGNOSIS — J439 Emphysema, unspecified: Secondary | ICD-10-CM | POA: Diagnosis not present

## 2017-09-07 DIAGNOSIS — I69398 Other sequelae of cerebral infarction: Secondary | ICD-10-CM | POA: Diagnosis not present

## 2017-09-07 DIAGNOSIS — G1229 Other motor neuron disease: Secondary | ICD-10-CM | POA: Diagnosis not present

## 2017-09-08 ENCOUNTER — Ambulatory Visit (INDEPENDENT_AMBULATORY_CARE_PROVIDER_SITE_OTHER): Payer: Medicare HMO

## 2017-09-08 DIAGNOSIS — F482 Pseudobulbar affect: Secondary | ICD-10-CM

## 2017-09-08 DIAGNOSIS — G1229 Other motor neuron disease: Secondary | ICD-10-CM | POA: Diagnosis not present

## 2017-09-08 DIAGNOSIS — I69398 Other sequelae of cerebral infarction: Secondary | ICD-10-CM | POA: Diagnosis not present

## 2017-09-08 DIAGNOSIS — I69322 Dysarthria following cerebral infarction: Secondary | ICD-10-CM | POA: Diagnosis not present

## 2017-09-08 DIAGNOSIS — I69351 Hemiplegia and hemiparesis following cerebral infarction affecting right dominant side: Secondary | ICD-10-CM | POA: Diagnosis not present

## 2017-09-12 ENCOUNTER — Ambulatory Visit (INDEPENDENT_AMBULATORY_CARE_PROVIDER_SITE_OTHER): Payer: Medicare HMO | Admitting: Family Medicine

## 2017-09-12 ENCOUNTER — Encounter: Payer: Self-pay | Admitting: Family Medicine

## 2017-09-12 VITALS — BP 137/80 | HR 76 | Temp 97.0°F | Ht 73.0 in | Wt 173.4 lb

## 2017-09-12 DIAGNOSIS — E785 Hyperlipidemia, unspecified: Secondary | ICD-10-CM

## 2017-09-12 DIAGNOSIS — F321 Major depressive disorder, single episode, moderate: Secondary | ICD-10-CM

## 2017-09-12 DIAGNOSIS — I69398 Other sequelae of cerebral infarction: Secondary | ICD-10-CM | POA: Diagnosis not present

## 2017-09-12 DIAGNOSIS — I252 Old myocardial infarction: Secondary | ICD-10-CM | POA: Diagnosis not present

## 2017-09-12 DIAGNOSIS — Z8673 Personal history of transient ischemic attack (TIA), and cerebral infarction without residual deficits: Secondary | ICD-10-CM

## 2017-09-12 DIAGNOSIS — G1229 Other motor neuron disease: Secondary | ICD-10-CM | POA: Diagnosis not present

## 2017-09-12 DIAGNOSIS — I10 Essential (primary) hypertension: Secondary | ICD-10-CM | POA: Diagnosis not present

## 2017-09-12 DIAGNOSIS — I69322 Dysarthria following cerebral infarction: Secondary | ICD-10-CM | POA: Diagnosis not present

## 2017-09-12 DIAGNOSIS — F482 Pseudobulbar affect: Secondary | ICD-10-CM | POA: Diagnosis not present

## 2017-09-12 DIAGNOSIS — J439 Emphysema, unspecified: Secondary | ICD-10-CM | POA: Diagnosis not present

## 2017-09-12 DIAGNOSIS — F329 Major depressive disorder, single episode, unspecified: Secondary | ICD-10-CM | POA: Diagnosis not present

## 2017-09-12 DIAGNOSIS — I69351 Hemiplegia and hemiparesis following cerebral infarction affecting right dominant side: Secondary | ICD-10-CM | POA: Diagnosis not present

## 2017-09-12 MED ORDER — BUPROPION HCL ER (XL) 150 MG PO TB24: 150.0000 mg | ORAL_TABLET | Freq: Every day | ORAL | 3 refills | Status: DC

## 2017-09-12 MED ORDER — ATORVASTATIN CALCIUM 20 MG PO TABS: 20.0000 mg | ORAL_TABLET | Freq: Every day | ORAL | 5 refills | Status: DC

## 2017-09-12 MED ORDER — AMLODIPINE BESYLATE 5 MG PO TABS: 5.0000 mg | ORAL_TABLET | Freq: Every day | ORAL | 3 refills | Status: DC

## 2017-09-12 NOTE — Patient Instructions (Signed)
Great to see you!  Start lipitor 20 mg once a day ( or night)  Come back in 6 weeks  You will get a call for a neurology referral.

## 2017-09-12 NOTE — Progress Notes (Signed)
   HPI  Patient presents today for follow-up  Short stay in the hospital recently, he had another CVA, he was found to have 70% carotid stenosis.  This is reported by his wife, records were requested  Hypertension Good tolerance and compliance with amlodipine  Depression Patient seems to still be having some crying spells, although less, he is also sleeping in the daytime less.  Still sleeping very well at night   PMH: Smoking status noted ROS: Per HPI  Objective: BP 137/80   Pulse 76   Temp (!) 97 F (36.1 C) (Oral)   Ht 6\' 1"  (1.854 m)   Wt 173 lb 6.4 oz (78.7 kg)   BMI 22.88 kg/m  Gen: NAD, alert, cooperative with exam HEENT: NCAT, EOMI, PERRL CV: RRR, good S1/S2, no murmur Resp: CTABL, no wheezes, non-labored Ext: No edema, warm Neuro: Alert and oriented, No gross deficits  Assessment and plan:  #History of CVA Patient with another CVA recently, he was started on Plavix by the hospitalist. Starting Lipitor today Refer to neurology, likely warrants intervention for carotid stenosis  #Hypertension Recently well controlled, no changes to amlodipine which was started last visit  #Depression Wellbutrin well-tolerated, possibly helping with decreased daytime somnolence Continue, follow-up 6 weeks  hyperlipidemia With history of CVA, LDL goal less than 70 Starting Lipitor 20 mg Repeat labs in 6 weeks  Orders Placed This Encounter  Procedures  . Ambulatory referral to Neurology    Referral Priority:   Routine    Referral Type:   Consultation    Referral Reason:   Specialty Services Required    Referred to Provider:   Phillips Odor, MD    Requested Specialty:   Neurology    Number of Visits Requested:   1    Meds ordered this encounter  Medications  . atorvastatin (LIPITOR) 20 MG tablet    Sig: Take 1 tablet (20 mg total) by mouth daily.    Dispense:  30 tablet    Refill:  [place], MD [facility] 09/12/2017, 4:33  PM

## 2017-09-13 DIAGNOSIS — I69322 Dysarthria following cerebral infarction: Secondary | ICD-10-CM | POA: Diagnosis not present

## 2017-09-13 DIAGNOSIS — I69351 Hemiplegia and hemiparesis following cerebral infarction affecting right dominant side: Secondary | ICD-10-CM | POA: Diagnosis not present

## 2017-09-13 DIAGNOSIS — I10 Essential (primary) hypertension: Secondary | ICD-10-CM | POA: Diagnosis not present

## 2017-09-13 DIAGNOSIS — F482 Pseudobulbar affect: Secondary | ICD-10-CM | POA: Diagnosis not present

## 2017-09-13 DIAGNOSIS — F329 Major depressive disorder, single episode, unspecified: Secondary | ICD-10-CM | POA: Diagnosis not present

## 2017-09-13 DIAGNOSIS — I252 Old myocardial infarction: Secondary | ICD-10-CM | POA: Diagnosis not present

## 2017-09-13 DIAGNOSIS — J439 Emphysema, unspecified: Secondary | ICD-10-CM | POA: Diagnosis not present

## 2017-09-13 DIAGNOSIS — I69398 Other sequelae of cerebral infarction: Secondary | ICD-10-CM | POA: Diagnosis not present

## 2017-09-13 DIAGNOSIS — G1229 Other motor neuron disease: Secondary | ICD-10-CM | POA: Diagnosis not present

## 2017-09-14 DIAGNOSIS — I10 Essential (primary) hypertension: Secondary | ICD-10-CM | POA: Diagnosis not present

## 2017-09-14 DIAGNOSIS — F329 Major depressive disorder, single episode, unspecified: Secondary | ICD-10-CM | POA: Diagnosis not present

## 2017-09-14 DIAGNOSIS — I252 Old myocardial infarction: Secondary | ICD-10-CM | POA: Diagnosis not present

## 2017-09-14 DIAGNOSIS — J439 Emphysema, unspecified: Secondary | ICD-10-CM | POA: Diagnosis not present

## 2017-09-14 DIAGNOSIS — I69351 Hemiplegia and hemiparesis following cerebral infarction affecting right dominant side: Secondary | ICD-10-CM | POA: Diagnosis not present

## 2017-09-14 DIAGNOSIS — I69398 Other sequelae of cerebral infarction: Secondary | ICD-10-CM | POA: Diagnosis not present

## 2017-09-14 DIAGNOSIS — G1229 Other motor neuron disease: Secondary | ICD-10-CM | POA: Diagnosis not present

## 2017-09-14 DIAGNOSIS — I69322 Dysarthria following cerebral infarction: Secondary | ICD-10-CM | POA: Diagnosis not present

## 2017-09-14 DIAGNOSIS — F482 Pseudobulbar affect: Secondary | ICD-10-CM | POA: Diagnosis not present

## 2017-09-15 DIAGNOSIS — G1229 Other motor neuron disease: Secondary | ICD-10-CM | POA: Diagnosis not present

## 2017-09-15 DIAGNOSIS — I69322 Dysarthria following cerebral infarction: Secondary | ICD-10-CM | POA: Diagnosis not present

## 2017-09-15 DIAGNOSIS — F482 Pseudobulbar affect: Secondary | ICD-10-CM | POA: Diagnosis not present

## 2017-09-15 DIAGNOSIS — I69351 Hemiplegia and hemiparesis following cerebral infarction affecting right dominant side: Secondary | ICD-10-CM | POA: Diagnosis not present

## 2017-09-15 DIAGNOSIS — I10 Essential (primary) hypertension: Secondary | ICD-10-CM | POA: Diagnosis not present

## 2017-09-15 DIAGNOSIS — I252 Old myocardial infarction: Secondary | ICD-10-CM | POA: Diagnosis not present

## 2017-09-15 DIAGNOSIS — F329 Major depressive disorder, single episode, unspecified: Secondary | ICD-10-CM | POA: Diagnosis not present

## 2017-09-15 DIAGNOSIS — I69398 Other sequelae of cerebral infarction: Secondary | ICD-10-CM | POA: Diagnosis not present

## 2017-09-15 DIAGNOSIS — J439 Emphysema, unspecified: Secondary | ICD-10-CM | POA: Diagnosis not present

## 2017-09-20 ENCOUNTER — Other Ambulatory Visit: Payer: Self-pay | Admitting: Family Medicine

## 2017-09-20 DIAGNOSIS — I69351 Hemiplegia and hemiparesis following cerebral infarction affecting right dominant side: Secondary | ICD-10-CM | POA: Diagnosis not present

## 2017-09-20 DIAGNOSIS — I69398 Other sequelae of cerebral infarction: Secondary | ICD-10-CM | POA: Diagnosis not present

## 2017-09-20 DIAGNOSIS — I252 Old myocardial infarction: Secondary | ICD-10-CM | POA: Diagnosis not present

## 2017-09-20 DIAGNOSIS — I10 Essential (primary) hypertension: Secondary | ICD-10-CM | POA: Diagnosis not present

## 2017-09-20 DIAGNOSIS — G1229 Other motor neuron disease: Secondary | ICD-10-CM | POA: Diagnosis not present

## 2017-09-20 DIAGNOSIS — F482 Pseudobulbar affect: Secondary | ICD-10-CM | POA: Diagnosis not present

## 2017-09-20 DIAGNOSIS — F329 Major depressive disorder, single episode, unspecified: Secondary | ICD-10-CM | POA: Diagnosis not present

## 2017-09-20 DIAGNOSIS — J439 Emphysema, unspecified: Secondary | ICD-10-CM | POA: Diagnosis not present

## 2017-09-20 DIAGNOSIS — I69322 Dysarthria following cerebral infarction: Secondary | ICD-10-CM | POA: Diagnosis not present

## 2017-09-27 DIAGNOSIS — I69322 Dysarthria following cerebral infarction: Secondary | ICD-10-CM | POA: Diagnosis not present

## 2017-09-27 DIAGNOSIS — J439 Emphysema, unspecified: Secondary | ICD-10-CM | POA: Diagnosis not present

## 2017-09-27 DIAGNOSIS — F329 Major depressive disorder, single episode, unspecified: Secondary | ICD-10-CM | POA: Diagnosis not present

## 2017-09-27 DIAGNOSIS — I69398 Other sequelae of cerebral infarction: Secondary | ICD-10-CM | POA: Diagnosis not present

## 2017-09-27 DIAGNOSIS — F482 Pseudobulbar affect: Secondary | ICD-10-CM | POA: Diagnosis not present

## 2017-09-27 DIAGNOSIS — I252 Old myocardial infarction: Secondary | ICD-10-CM | POA: Diagnosis not present

## 2017-09-27 DIAGNOSIS — G1229 Other motor neuron disease: Secondary | ICD-10-CM | POA: Diagnosis not present

## 2017-09-27 DIAGNOSIS — I69351 Hemiplegia and hemiparesis following cerebral infarction affecting right dominant side: Secondary | ICD-10-CM | POA: Diagnosis not present

## 2017-09-27 DIAGNOSIS — I10 Essential (primary) hypertension: Secondary | ICD-10-CM | POA: Diagnosis not present

## 2017-10-04 DIAGNOSIS — F482 Pseudobulbar affect: Secondary | ICD-10-CM | POA: Diagnosis not present

## 2017-10-04 DIAGNOSIS — J439 Emphysema, unspecified: Secondary | ICD-10-CM | POA: Diagnosis not present

## 2017-10-04 DIAGNOSIS — I252 Old myocardial infarction: Secondary | ICD-10-CM | POA: Diagnosis not present

## 2017-10-04 DIAGNOSIS — F329 Major depressive disorder, single episode, unspecified: Secondary | ICD-10-CM | POA: Diagnosis not present

## 2017-10-04 DIAGNOSIS — G1229 Other motor neuron disease: Secondary | ICD-10-CM | POA: Diagnosis not present

## 2017-10-04 DIAGNOSIS — I10 Essential (primary) hypertension: Secondary | ICD-10-CM | POA: Diagnosis not present

## 2017-10-04 DIAGNOSIS — I69351 Hemiplegia and hemiparesis following cerebral infarction affecting right dominant side: Secondary | ICD-10-CM | POA: Diagnosis not present

## 2017-10-04 DIAGNOSIS — I69322 Dysarthria following cerebral infarction: Secondary | ICD-10-CM | POA: Diagnosis not present

## 2017-10-04 DIAGNOSIS — I69398 Other sequelae of cerebral infarction: Secondary | ICD-10-CM | POA: Diagnosis not present

## 2017-10-10 DIAGNOSIS — I252 Old myocardial infarction: Secondary | ICD-10-CM | POA: Diagnosis not present

## 2017-10-10 DIAGNOSIS — G1229 Other motor neuron disease: Secondary | ICD-10-CM | POA: Diagnosis not present

## 2017-10-10 DIAGNOSIS — I69351 Hemiplegia and hemiparesis following cerebral infarction affecting right dominant side: Secondary | ICD-10-CM | POA: Diagnosis not present

## 2017-10-10 DIAGNOSIS — F329 Major depressive disorder, single episode, unspecified: Secondary | ICD-10-CM | POA: Diagnosis not present

## 2017-10-10 DIAGNOSIS — F482 Pseudobulbar affect: Secondary | ICD-10-CM | POA: Diagnosis not present

## 2017-10-10 DIAGNOSIS — I69322 Dysarthria following cerebral infarction: Secondary | ICD-10-CM | POA: Diagnosis not present

## 2017-10-10 DIAGNOSIS — I10 Essential (primary) hypertension: Secondary | ICD-10-CM | POA: Diagnosis not present

## 2017-10-10 DIAGNOSIS — J439 Emphysema, unspecified: Secondary | ICD-10-CM | POA: Diagnosis not present

## 2017-10-10 DIAGNOSIS — I69398 Other sequelae of cerebral infarction: Secondary | ICD-10-CM | POA: Diagnosis not present

## 2017-10-17 DIAGNOSIS — F329 Major depressive disorder, single episode, unspecified: Secondary | ICD-10-CM | POA: Diagnosis not present

## 2017-10-17 DIAGNOSIS — I69398 Other sequelae of cerebral infarction: Secondary | ICD-10-CM | POA: Diagnosis not present

## 2017-10-17 DIAGNOSIS — I69351 Hemiplegia and hemiparesis following cerebral infarction affecting right dominant side: Secondary | ICD-10-CM | POA: Diagnosis not present

## 2017-10-17 DIAGNOSIS — I252 Old myocardial infarction: Secondary | ICD-10-CM | POA: Diagnosis not present

## 2017-10-17 DIAGNOSIS — F482 Pseudobulbar affect: Secondary | ICD-10-CM | POA: Diagnosis not present

## 2017-10-17 DIAGNOSIS — J439 Emphysema, unspecified: Secondary | ICD-10-CM | POA: Diagnosis not present

## 2017-10-17 DIAGNOSIS — G1229 Other motor neuron disease: Secondary | ICD-10-CM | POA: Diagnosis not present

## 2017-10-17 DIAGNOSIS — I10 Essential (primary) hypertension: Secondary | ICD-10-CM | POA: Diagnosis not present

## 2017-10-17 DIAGNOSIS — I69322 Dysarthria following cerebral infarction: Secondary | ICD-10-CM | POA: Diagnosis not present

## 2017-10-19 ENCOUNTER — Other Ambulatory Visit: Payer: Self-pay | Admitting: Family Medicine

## 2017-10-24 ENCOUNTER — Encounter: Payer: Self-pay | Admitting: Family Medicine

## 2017-10-24 ENCOUNTER — Ambulatory Visit (INDEPENDENT_AMBULATORY_CARE_PROVIDER_SITE_OTHER): Payer: Medicare HMO | Admitting: Family Medicine

## 2017-10-24 VITALS — BP 125/83 | HR 71 | Temp 96.1°F | Ht 73.0 in | Wt 176.2 lb

## 2017-10-24 DIAGNOSIS — E785 Hyperlipidemia, unspecified: Secondary | ICD-10-CM

## 2017-10-24 DIAGNOSIS — F321 Major depressive disorder, single episode, moderate: Secondary | ICD-10-CM

## 2017-10-24 MED ORDER — BUPROPION HCL ER (XL) 300 MG PO TB24: 300.0000 mg | ORAL_TABLET | Freq: Every day | ORAL | 1 refills | Status: DC

## 2017-10-24 NOTE — Progress Notes (Signed)
   HPI  Patient presents today for follow-up depression and hyperlipidemia.  Patient has recent medical history of CVA. He was started on Lipitor and has tolerated it well. He denies any side effects.  Depression He is on Wellbutrin and tolerating it well, he cannot tell a difference His initial complaint was depression and his wifes stated that he was sleeping all day, he is sleeping less during the day and seems to have improved energy.   PMH: Smoking status noted ROS: Per HPI  Objective: BP 125/83   Pulse 71   Temp (!) 96.1 F (35.6 C) (Oral)   Ht _0  (1.854 m)   Wt 176 lb 3.2 oz (79.9 kg)   BMI 23.25 kg/m  Gen: NAD, alert, cooperative with exam HEENT: NCAT CV: RRR, good S1/S2, no murmur Resp: CTABL, no wheezes, non-labored Ext: No edema, warm Neuro: Alert and oriented, No gross deficits  Assessment and plan:  # HLD With Hx of CVA- LDL goal less than 70 Continue lipitor, may titrate after LDL today nonfasting  # Depression Primarily anhedonia type depression, I think is improving overall but pt is not seeing dramatic difference, titrate wellbutrin to 300 mg.     Orders Placed This Encounter  Procedures  . CMP14+EGFR  . LDL Cholesterol, Direct    Meds ordered this encounter  Medications  . buPROPion (WELLBUTRIN XL) 300 MG 24 hr tablet    Sig: Take 1 tablet (300 mg total) by mouth daily.    Dispense:  90 tablet    Refill:  Viola, MD [facility] 10/24/2017, 1:20 PM

## 2017-10-24 NOTE — Patient Instructions (Signed)
Great to see you!  Come back in 2 months to see Dr. Dwttinger for follow up depression.   I have increased his Wellbutrin to 300 mg once daily.

## 2017-10-25 ENCOUNTER — Other Ambulatory Visit: Payer: Self-pay | Admitting: *Deleted

## 2017-10-25 LAB — CMP14+EGFR
ALBUMIN: 4.3 g/dL (ref 3.6–4.8)
ALK PHOS: 110 IU/L (ref 39–117)
ALT: 16 IU/L (ref 0–44)
AST: 13 IU/L (ref 0–40)
Albumin/Globulin Ratio: 1.7 (ref 1.2–2.2)
BUN / CREAT RATIO: 12 (ref 10–24)
BUN: 12 mg/dL (ref 8–27)
Bilirubin Total: 0.3 mg/dL (ref 0.0–1.2)
CO2: 26 mmol/L (ref 20–29)
CREATININE: 0.97 mg/dL (ref 0.76–1.27)
Calcium: 9.3 mg/dL (ref 8.6–10.2)
Chloride: 107 mmol/L — ABNORMAL HIGH (ref 96–106)
GFR calc Af Amer: 92 mL/min/{1.73_m2} (ref >59)
GFR calc non Af Amer: 79 mL/min/{1.73_m2} (ref >59)
Globulin, Total: 2.6 g/dL (ref 1.5–4.5)
Glucose: 90 mg/dL (ref 65–99)
Potassium: 4.5 mmol/L (ref 3.5–5.2)
SODIUM: 146 mmol/L — AB (ref 134–144)
Total Protein: 6.9 g/dL (ref 6.0–8.5)

## 2017-10-25 LAB — LDL CHOLESTEROL, DIRECT: LDL DIRECT: 82 mg/dL (ref 0–99)

## 2017-10-25 MED ORDER — ATORVASTATIN CALCIUM 40 MG PO TABS: 40.0000 mg | ORAL_TABLET | Freq: Every day | ORAL | 1 refills | Status: DC

## 2017-10-31 DIAGNOSIS — I6521 Occlusion and stenosis of right carotid artery: Secondary | ICD-10-CM | POA: Diagnosis not present

## 2017-10-31 DIAGNOSIS — I69393 Ataxia following cerebral infarction: Secondary | ICD-10-CM | POA: Diagnosis not present

## 2017-10-31 DIAGNOSIS — I69322 Dysarthria following cerebral infarction: Secondary | ICD-10-CM | POA: Diagnosis not present

## 2017-10-31 DIAGNOSIS — E782 Mixed hyperlipidemia: Secondary | ICD-10-CM | POA: Diagnosis not present

## 2017-11-01 ENCOUNTER — Other Ambulatory Visit: Payer: Medicare HMO

## 2017-11-01 DIAGNOSIS — Z79899 Other long term (current) drug therapy: Secondary | ICD-10-CM | POA: Diagnosis not present

## 2017-11-13 ENCOUNTER — Other Ambulatory Visit: Payer: Self-pay

## 2017-11-13 MED ORDER — CLOPIDOGREL BISULFATE 75 MG PO TABS: 75.0000 mg | ORAL_TABLET | Freq: Every day | ORAL | 2 refills | Status: DC

## 2017-12-14 ENCOUNTER — Ambulatory Visit (INDEPENDENT_AMBULATORY_CARE_PROVIDER_SITE_OTHER): Payer: Medicare HMO | Admitting: *Deleted

## 2017-12-14 ENCOUNTER — Encounter: Payer: Self-pay | Admitting: *Deleted

## 2017-12-14 VITALS — BP 144/82 | HR 76 | Ht 70.5 in | Wt 179.0 lb

## 2017-12-14 DIAGNOSIS — Z0000 Encounter for general adult medical examination without abnormal findings: Secondary | ICD-10-CM | POA: Diagnosis not present

## 2017-12-14 NOTE — Progress Notes (Signed)
Subjective:   Walter [name] is a 67 y.o. male who presents for a Medicare Annual Wellness Visit. Walter [name] lives at home alone. He had a daughter but she passed away. He has 3 grandchildren and he sees his grandson every week or two. He also has a sister that he sees and talks to often.    Review of Systems    Patient reports that his overall health is unchanged compared to last year.  Cardiac Risk Factors include: advanced age (>38men, >29 women);male gender;dyslipidemia;hypertension;sedentary lifestyle;smoking/ tobacco exposure   All other systems negative       Current Medications (verified) Outpatient Encounter Medications as of 12/14/2017  Medication Sig  . amLODipine (NORVASC) 5 MG tablet Take 1 tablet (5 mg total) by mouth daily.  [name] atorvastatin (LIPITOR) 40 MG tablet Take 1 tablet (40 mg total) by mouth daily.  [name] buPROPion (WELLBUTRIN XL) 300 MG 24 hr tablet Take 1 tablet (300 mg total) by mouth daily.  . clopidogrel (PLAVIX) 75 MG tablet Take 1 tablet (75 mg total) by mouth daily.   No facility-administered encounter medications on file as of 12/14/2017.     Allergies (verified) Patient has no known allergies.   History: Past Medical History:  Diagnosis Date  . Acute urinary retention 02/12/2014  . Brain TIA    recurrent   . C. difficile enteritis   . Carotid bruit   . COPD (chronic obstructive pulmonary disease) ([place])   . Coronary artery disease   . Myocardial infarction ([place])    incidental  . Nodule of kidney 02/14/14   solid on left  . Recurrent colitis due to Clostridium difficile 02/11/2014  . Stroke [facility]) 1997   Past Surgical History:  Procedure Laterality Date  . BACK SURGERY    . KNEE SURGERY     Family History  Problem Relation Age of Onset  . Cancer Father 28       stomach  . Coronary artery disease Mother 92  . Stroke Mother   . Coronary artery disease Brother 46       AMI deceased  . Heart attack Daughter    Social History    Socioeconomic History  . Marital status: Married    Spouse name: Not on file  . Number of children: 1  . Years of education: 77  . Highest education level: 10th grade  Occupational History  . Occupation: truck Geophysicist/field seismologist    Comment: unable becasue of CVA  . Occupation: disability    Comment: since 2000  Social Needs  . Financial resource strain: Not hard at all  . Food insecurity:    Worry: Never true    Inability: Never true  . Transportation needs:    Medical: No    Non-medical: No  Tobacco Use  . Smoking status: Current Every Day Smoker    Packs/day: 0.10    Years: 17.00    Pack years: 1.70    Types: Cigarettes  . Smokeless tobacco: Never Used  Substance and Sexual Activity  . Alcohol use: No  . Drug use: No  . Sexual activity: Not Currently  Lifestyle  . Physical activity:    Days per week: 0 days    Minutes per session: 0 min  . Stress: Only a little  Relationships  . Social connections:    Talks on phone: More than three times a week    Gets together: More than three times a week    Attends religious service: Never  Active member of club or organization: No    Attends meetings of clubs or organizations: Never    Relationship status: Married  Other Topics Concern  . Not on file  Social History Narrative  . Not on file    Tobacco Use Yes.  Smoking cessation offered? Yes  Clinical Intake:           Nutritional Status: BMI of 19-24  Normal           Activities of Daily Living In your present state of health, do you have any difficulty performing the following activities: 12/14/2017  Hearing? N  Vision? Y  Comment some blurred vision from cataracts. has been evaluated  Difficulty concentrating or making decisions? Y  Walking or climbing stairs? Y  Comment has porch steps  Dressing or bathing? N  Doing errands, shopping? N  Preparing Food and eating ? N  Using the Toilet? N  In the past six months, have you accidently leaked urine? N   Do you have problems with loss of bowel control? N  Managing your Medications? Y  Comment wife keeps up with medications  Managing your Finances? N  Housekeeping or managing your Housekeeping? N  Some recent data might be hidden     Diet 2 meals a day Mainly drinks soda  Does not drink water  Exercise Current Exercise Habits: The patient does not participate in regular exercise at present, Exercise limited by: neurologic condition(s);psychological condition(s);orthopedic condition(s)    Depression Screen PHQ 2/9 Scores 12/14/2017 10/24/2017 09/12/2017 08/15/2017  PHQ - 2 Score 2 4 4 5   PHQ- 9 Score 9 13 15 18      Fall Risk Fall Risk  12/14/2017 10/24/2017 09/12/2017 08/15/2017 05/23/2016  Falls in the past year? Yes No No No No  Number falls in past yr: 2 or more - - - -  Injury with Fall? No - - - -  Risk Factor Category  High Fall Risk - - - -  Risk for fall due to : Impaired balance/gait;Impaired mobility;History of fall(s) - - - -  Follow up Falls prevention discussed - - - -    Safety Is the patient's home free of loose throw rugs in walkways, pet beds, electrical cords, etc?   yes      Grab bars in the bathroom? no      Walkin shower? no      Shower Seat? no      Handrails on the stairs?   yes      Adequate lighting?   yes  Patient Care Team: Dettinger, Fransisca Kaufmann, MD as PCP - General (Family Medicine) Gala Romney Cristopher Estimable, MD as Consulting Physician (Gastroenterology)  Hospitalizations, surgeries, and ER visits in previous 12 months No hospitalizations, ER visits, or surgeries this past year.   Objective:    Today's Vitals   12/14/17 1552  BP: (!) 144/82  Pulse: 76  Weight: 179 lb (81.2 kg)  Height: 5' 10.5" (1.791 m)   Body mass index is 25.32 kg/m.  Advanced Directives 12/14/2017 02/12/2014 02/11/2014 01/30/2014 01/12/2014 02/18/2012  Does Patient Have a Medical Advance Directive? No No No No No Patient does not have advance directive;Patient would not like  information  Would patient like information on creating a medical advance directive? No - Patient declined No - patient declined information No - patient declined information No - patient declined information - -    Hearing/Vision  No hearing or vision deficits noted during visit.   Cognitive Function:  MMSE - Mini Mental State Exam 12/14/2017  Orientation to time 4  Orientation to Place 4  Registration 3  Attention/ Calculation 5  Recall 2  Language- name 2 objects 2  Language- repeat 1  Language- follow 3 step command 3  Language- read & follow direction 1  Write a sentence 1  Copy design 1  Total score 27       Normal Cognitive Function Screening: Yes    Immunizations and Health Maintenance There is no immunization history for the selected administration types on file for this patient. There are no preventive care reminders to display for this patient. Health Maintenance  Topic Date Due  . COLONOSCOPY  12/15/2018 (Originally 09/13/1998)  . TETANUS/TDAP  12/15/2018 (Originally 09/13/1967)  . Hepatitis C Screening  12/15/2018 (Originally May 22, 1950)  . PNA vac Low Risk Adult (1 of 2 - PCV13) 12/15/2018 (Originally 09/12/2013)  . INFLUENZA VACCINE  Discontinued        Assessment:   This is a routine wellness examination for Walter [name].      Plan:    Goals    . DIET - DECREASE SODA OR JUICE INTAKE    . DIET - INCREASE WATER INTAKE    . Exercise 150 min/wk Moderate Activity    . Prevent falls     Use your cane or walker when walking at all times    . Quit Smoking        Health Maintenance Recommendations: Declined vaccines and colonoscopy  Additional Screening Recommendations: Lung: Low Dose CT Chest recommended if Age 57-80 years, 30 pack-year currently smoking OR have quit w/in 15years. Patient does qualify. Hepatitis C Screening recommended: yes   Keep f/u with Dettinger, Fransisca Kaufmann, MD and any other specialty appointments you may have Continue current  medications Move carefully to avoid falls. Use assistive devices like a cane or walker if needed. Aim for at least 150 minutes of moderate activity a week. This can be chair exercises if necessary. Reading or puzzles are a good way to exercise your brain Stay connected with friends and family. Social connections are beneficial to your emotional and mental health.   I have personally reviewed and noted the following in the patient's chart:   . Medical and social history . Use of alcohol, tobacco or illicit drugs  . Current medications and supplements . Functional ability and status . Nutritional status . Physical activity . Advanced directives . List of other physicians . Hospitalizations, surgeries, and ER visits in previous 12 months . Vitals . Screenings to include cognitive, depression, and falls . Referrals and appointments  In addition, I have reviewed and discussed with patient certain preventive protocols, quality metrics, and best practice recommendations. A written personalized care plan for preventive services as well as general preventive health recommendations were provided to patient.     [name], RN   12/14/2017

## 2017-12-14 NOTE — Patient Instructions (Signed)
  Mr. Walter [name] , Thank you for taking time to come for your Medicare Wellness Visit. I appreciate your ongoing commitment to your health goals. Please review the following plan we discussed and let me know if I can assist you in the future.   These are the goals we discussed: Goals    . DIET - DECREASE SODA OR JUICE INTAKE    . DIET - INCREASE WATER INTAKE    . Exercise 150 min/wk Moderate Activity    . Prevent falls     Use your cane or walker when walking at all times    . Quit Smoking       This is a list of the screening recommended for you and due dates:  Health Maintenance  Topic Date Due  . Colon Cancer Screening  12/15/2018*  . Tetanus Vaccine  12/15/2018*  .  Hepatitis C: One time screening is recommended by Center for Disease Control  (CDC) for  adults born from [birth year] through 1967.   12/15/2018*  . Pneumonia vaccines (1 of 2 - PCV13) 12/15/2018*  . Flu Shot  Discontinued  *Topic was postponed. The date shown is not the original due date.    Think about joining the [employer] for exercise and activities. Tai Chi may help with balance and strength training.   I will talk with Dr Warrick Parisian about maybe ordering physical therapy for gait training

## 2018-01-01 ENCOUNTER — Ambulatory Visit: Payer: Medicare HMO | Admitting: Family Medicine

## 2018-01-03 ENCOUNTER — Encounter: Payer: Self-pay | Admitting: Family Medicine

## 2018-01-03 ENCOUNTER — Ambulatory Visit (INDEPENDENT_AMBULATORY_CARE_PROVIDER_SITE_OTHER): Payer: Medicare HMO | Admitting: Family Medicine

## 2018-01-03 VITALS — BP 147/92 | HR 79 | Temp 97.2°F | Ht 70.5 in | Wt 180.0 lb

## 2018-01-03 DIAGNOSIS — F339 Major depressive disorder, recurrent, unspecified: Secondary | ICD-10-CM | POA: Diagnosis not present

## 2018-01-03 DIAGNOSIS — J449 Chronic obstructive pulmonary disease, unspecified: Secondary | ICD-10-CM

## 2018-01-03 DIAGNOSIS — I10 Essential (primary) hypertension: Secondary | ICD-10-CM | POA: Diagnosis not present

## 2018-01-03 MED ORDER — CLOPIDOGREL BISULFATE 75 MG PO TABS: 75.0000 mg | ORAL_TABLET | Freq: Every day | ORAL | 3 refills | Status: DC

## 2018-01-03 NOTE — Progress Notes (Signed)
BP (!) 147/92   Pulse 79   Temp (!) 97.2 F (36.2 C) (Oral)   Ht 5' 10.5" (1.791 m)   Wt 180 lb (81.6 kg)   BMI 25.46 kg/m    Subjective:    Patient ID: Walter [name], male    DOB: [birth year], 67 y.o.   MRN: [id number]  HPI: Walter [name] is a 67 y.o. male presenting on 01/03/2018 for Depression (2 month follow up) and Establish Care Wendi Snipes pt)   HPI Hypertension Patient is currently on amlodipine, and their blood pressure today is 147/92, patient is already unsteady on his feet so we are not going to force blood pressure down for possible hypotension. Patient denies any lightheadedness or dizziness. Patient denies headaches, blurred vision, chest pains, shortness of breath, or weakness. Denies any side effects from medication and is content with current medication.   Depression recheck Patient is coming for depression recheck today.  He says that he is doing very well and is happy with where he is out on his Wellbutrin.  He says he actually circled with Chere on thoughts of better off dead but circle that wrong in mental circle is 0.  He denies any suicidal ideations and says his mood is doing very well and is very happy with where things are at right now.  COPD Patient is coming in for COPD recheck today.  He is currently on no medication is just monitoring for now.  He has a mild chronic cough but denies any major coughing spells or wheezing spells.  He has 0nighttime symptoms per week and 0daytime symptoms per week currently.   Relevant past medical, surgical, family and social history reviewed and updated as indicated. Interim medical history since our last visit reviewed. Allergies and medications reviewed and updated.  Review of Systems  Constitutional: Negative for chills and fever.  Eyes: Negative for visual disturbance.  Respiratory: Negative for shortness of breath and wheezing.   Cardiovascular: Negative for chest pain and leg swelling.  Musculoskeletal: Negative for  back pain and gait problem.  Skin: Negative for rash.  Neurological: Negative for dizziness, weakness, light-headedness and numbness.  All other systems reviewed and are negative.   Per HPI unless specifically indicated above   Allergies as of 01/03/2018   No Known Allergies     Medication List        Accurate as of 01/03/18  1:20 PM. Always use your most recent med list.          amLODipine 5 MG tablet Commonly known as:  NORVASC Take 1 tablet (5 mg total) by mouth daily.   atorvastatin 40 MG tablet Commonly known as:  LIPITOR Take 1 tablet (40 mg total) by mouth daily.   buPROPion 300 MG 24 hr tablet Commonly known as:  WELLBUTRIN XL Take 1 tablet (300 mg total) by mouth daily.   clopidogrel 75 MG tablet Commonly known as:  PLAVIX Take 1 tablet (75 mg total) by mouth daily.          Objective:    BP (!) 147/92   Pulse 79   Temp (!) 97.2 F (36.2 C) (Oral)   Ht 5' 10.5" (1.791 m)   Wt 180 lb (81.6 kg)   BMI 25.46 kg/m   Wt Readings from Last 3 Encounters:  01/03/18 180 lb (81.6 kg)  12/14/17 179 lb (81.2 kg)  10/24/17 176 lb 3.2 oz (79.9 kg)    Physical Exam  Constitutional: He is oriented to  person, place, and time. He appears well-developed and well-nourished. No distress.  Eyes: Conjunctivae are normal. No scleral icterus.  Neck: Neck supple. No thyromegaly present.  Cardiovascular: Normal rate, regular rhythm, normal heart sounds and intact distal pulses.  No murmur heard. Pulmonary/Chest: Effort normal and breath sounds normal. No respiratory distress. He has no wheezes.  Musculoskeletal: Normal range of motion. He exhibits no edema.  Lymphadenopathy:    He has no cervical adenopathy.  Neurological: He is alert and oriented to person, place, and time. Coordination normal.  Skin: Skin is warm and dry. No rash noted. He is not diaphoretic.  Psychiatric: He has a normal mood and affect. His behavior is normal. His mood appears not anxious. He does  not exhibit a depressed mood. He expresses no suicidal ideation. He expresses no suicidal plans.  Nursing note and vitals reviewed.       Assessment & Plan:   Problem List Items Addressed This Visit      Cardiovascular and Mediastinum   HTN (hypertension)   Relevant Orders   BMP8+EGFR   Lipid panel     Respiratory   COPD (chronic obstructive pulmonary disease) ([place])    Other Visit Diagnoses    Depression, recurrent ([place])    -  Primary     Patient denies any suicidal ideations and says he just circle the wrong numbers on the PHQ-9.  He says he is doing well on his Wellbutrin and is very happy with where things are at.  Patient usually comes in with his wife and it seems like he has a little bit of memory issues without her here.  Follow up plan: Return in about 3 months (around 04/05/2018), or if symptoms worsen or fail to improve, for Recheck depression and hypertension.  Counseling provided for all of the vaccine components Orders Placed This Encounter  Procedures  . BMP8+EGFR  . Lipid panel    Caryl Pina, MD [facility] 01/03/2018, 1:20 PM

## 2018-01-04 LAB — BMP8+EGFR
BUN/Creatinine Ratio: 13 (ref 10–24)
BUN: 11 mg/dL (ref 8–27)
CALCIUM: 9.4 mg/dL (ref 8.6–10.2)
CO2: 23 mmol/L (ref 20–29)
CREATININE: 0.86 mg/dL (ref 0.76–1.27)
Chloride: 106 mmol/L (ref 96–106)
GFR calc Af Amer: 102 mL/min/{1.73_m2} (ref >59)
GFR, EST NON AFRICAN AMERICAN: 88 mL/min/{1.73_m2} (ref >59)
GLUCOSE: 91 mg/dL (ref 65–99)
Potassium: 4 mmol/L (ref 3.5–5.2)
SODIUM: 143 mmol/L (ref 134–144)

## 2018-01-04 LAB — LIPID PANEL
CHOL/HDL RATIO: 5 ratio (ref 0.0–5.0)
CHOLESTEROL TOTAL: 124 mg/dL (ref 100–199)
HDL: 25 mg/dL — ABNORMAL LOW (ref >39)
LDL CALC: 61 mg/dL (ref 0–99)
TRIGLYCERIDES: 190 mg/dL — AB (ref 0–149)
VLDL CHOLESTEROL CAL: 38 mg/dL (ref 5–40)

## 2018-04-06 ENCOUNTER — Ambulatory Visit: Payer: Medicare HMO | Admitting: Family Medicine

## 2018-04-06 ENCOUNTER — Encounter: Payer: Self-pay | Admitting: Family Medicine

## 2018-04-06 VITALS — BP 154/85 | HR 87 | Temp 97.9°F | Ht 70.5 in | Wt 190.0 lb

## 2018-04-06 DIAGNOSIS — I10 Essential (primary) hypertension: Secondary | ICD-10-CM

## 2018-04-06 DIAGNOSIS — E782 Mixed hyperlipidemia: Secondary | ICD-10-CM | POA: Diagnosis not present

## 2018-04-06 DIAGNOSIS — R296 Repeated falls: Secondary | ICD-10-CM

## 2018-04-06 DIAGNOSIS — F339 Major depressive disorder, recurrent, unspecified: Secondary | ICD-10-CM | POA: Insufficient documentation

## 2018-04-06 DIAGNOSIS — R29898 Other symptoms and signs involving the musculoskeletal system: Secondary | ICD-10-CM

## 2018-04-06 MED ORDER — ATORVASTATIN CALCIUM 40 MG PO TABS: 40.0000 mg | ORAL_TABLET | Freq: Every day | ORAL | 3 refills | Status: DC

## 2018-04-06 MED ORDER — BUPROPION HCL ER (XL) 300 MG PO TB24: 300.0000 mg | ORAL_TABLET | Freq: Every day | ORAL | 3 refills | Status: DC

## 2018-04-06 NOTE — Progress Notes (Signed)
BP (!) 154/85   Pulse 87   Temp 97.9 F (36.6 C) (Oral)   Ht 5' 10.5" (1.791 m)   Wt 190 lb (86.2 kg)   BMI 26.88 kg/m    Subjective:    Patient ID: Walter [name], male    DOB: 05-24-1950, 68 y.o.   MRN: [id number]  HPI: Walter [name] is a 68 y.o. male presenting on 04/06/2018 for Depression (3 month follow up) and Hyperlipidemia   HPI Hypertension Patient is currently on amlodipine, and their blood pressure today is 154/85 but he says his blood pressures been running better when he checks it at the pharmacy through home.. Patient denies any lightheadedness or dizziness. Patient denies headaches, blurred vision, chest pains, shortness of breath, or weakness. Denies any side effects from medication and is content with current medication.   Hyperlipidemia Patient is coming in for recheck of his hyperlipidemia. The patient is currently taking Lipitor. They deny any issues with myalgias or history of liver damage from it. They deny any focal numbness or weakness or chest pain.   Patient comes in also complains of recurrent falls and bilateral lower extremity weakness that has been worsening.  He says he is now falling every day and is concerned that he might eventually break something.  He is coming in today to see what can be done about it.  He says he just feels weak in both of his lower extremities.  He denies any lightheadedness or dizziness.  He denies any chest pain or shortness of breath.  Patient is also coming in for depression and mood recheck and he says he has been stable and not having any issues with it and is very satisfied with where he is been with this.  Relevant past medical, surgical, family and social history reviewed and updated as indicated. Interim medical history since our last visit reviewed. Allergies and medications reviewed and updated.  Review of Systems  Constitutional: Negative for chills and fever.  Eyes: Negative for discharge.  Respiratory: Negative for  shortness of breath and wheezing.   Cardiovascular: Negative for chest pain and leg swelling.  Musculoskeletal: Negative for back pain and gait problem.  Skin: Negative for rash.  Neurological: Positive for weakness. Negative for dizziness, light-headedness, numbness and headaches.  Psychiatric/Behavioral: Negative for dysphoric mood and sleep disturbance. The patient is not nervous/anxious.   All other systems reviewed and are negative.   Per HPI unless specifically indicated above   Allergies as of 04/06/2018   No Known Allergies     Medication List       Accurate as of April 06, 2018  1:34 PM. Always use your most recent med list.        amLODipine 5 MG tablet Commonly known as:  NORVASC Take 1 tablet (5 mg total) by mouth daily.   atorvastatin 40 MG tablet Commonly known as:  LIPITOR Take 1 tablet (40 mg total) by mouth daily.   buPROPion 300 MG 24 hr tablet Commonly known as:  WELLBUTRIN XL Take 1 tablet (300 mg total) by mouth daily.   clopidogrel 75 MG tablet Commonly known as:  PLAVIX Take 1 tablet (75 mg total) by mouth daily.          Objective:    BP (!) 154/85   Pulse 87   Temp 97.9 F (36.6 C) (Oral)   Ht 5' 10.5" (1.791 m)   Wt 190 lb (86.2 kg)   BMI 26.88 kg/m   Wt  Readings from Last 3 Encounters:  04/06/18 190 lb (86.2 kg)  01/03/18 180 lb (81.6 kg)  12/14/17 179 lb (81.2 kg)    Physical Exam Vitals signs and nursing note reviewed.  Constitutional:      General: He is not in acute distress.    Appearance: He is well-developed. He is not diaphoretic.  Eyes:     General: No scleral icterus.    Conjunctiva/sclera: Conjunctivae normal.  Neck:     Musculoskeletal: Neck supple.     Thyroid: No thyromegaly.  Cardiovascular:     Rate and Rhythm: Normal rate and regular rhythm.     Heart sounds: Normal heart sounds. No murmur.  Pulmonary:     Effort: Pulmonary effort is normal. No respiratory distress.     Breath sounds: Normal breath  sounds. No wheezing.  Musculoskeletal: Normal range of motion.  Lymphadenopathy:     Cervical: No cervical adenopathy.  Skin:    General: Skin is warm and dry.     Findings: No rash.  Neurological:     Mental Status: He is alert and oriented to person, place, and time.     Motor: Weakness (4 out of 5 bilateral lower extremity) present.     Coordination: Coordination normal.     Comments: Patient also has difficulty with sit and stand test and almost falls over from sit and stand test.  Psychiatric:        Behavior: Behavior normal.         Assessment & Plan:   Problem List Items Addressed This Visit      Cardiovascular and Mediastinum   HTN (hypertension) - Primary   Relevant Medications   atorvastatin (LIPITOR) 40 MG tablet     Other   Depression, recurrent ([facility])   Relevant Medications   buPROPion (WELLBUTRIN XL) 300 MG 24 hr tablet   Hyperlipidemia, mixed   Relevant Medications   atorvastatin (LIPITOR) 40 MG tablet    Other Visit Diagnoses    Recurrent falls       Relevant Orders   Ambulatory referral to Physical Therapy   Weakness of both lower extremities       Relevant Orders   Ambulatory referral to Physical Therapy       Follow up plan: Return in about 3 months (around 07/06/2018), or if symptoms worsen or fail to improve, for Depression and hypertension and cholesterol recheck.  Counseling provided for all of the vaccine components No orders of the defined types were placed in this encounter.   Walter Pina, MD [facility] 04/06/2018, 1:34 PM

## 2018-04-13 ENCOUNTER — Ambulatory Visit: Payer: Self-pay | Admitting: Licensed Clinical Social Worker

## 2018-04-13 ENCOUNTER — Telehealth: Payer: Self-pay | Admitting: Licensed Clinical Social Worker

## 2018-04-13 DIAGNOSIS — Z9119 Patient's noncompliance with other medical treatment and regimen: Secondary | ICD-10-CM

## 2018-04-13 DIAGNOSIS — F339 Major depressive disorder, recurrent, unspecified: Secondary | ICD-10-CM

## 2018-04-13 DIAGNOSIS — Z8673 Personal history of transient ischemic attack (TIA), and cerebral infarction without residual deficits: Secondary | ICD-10-CM

## 2018-04-13 DIAGNOSIS — Z91199 Patient's noncompliance with other medical treatment and regimen due to unspecified reason: Secondary | ICD-10-CM

## 2018-04-13 NOTE — Patient Instructions (Signed)
Licensed Clinical Social Worker Visit Information  Patient Instructions:   Call LCSW if needed for psychosocial support  LCSW will call you over the next 2 weeks   Mr. Septer and spouse, [name], were given information about Chronic Care Management services today including:  1. CCM service includes personalized support from designated clinical staff supervised by his physician, including individualized plan of care and coordination with other care providers 2. 24/7 contact phone numbers for assistance for urgent and routine care needs. 3. Service will only be billed when office clinical staff spend 20 minutes or more in a month to coordinate care. 4. Only one practitioner may furnish and bill the service in a calendar month. 5. The patient may stop CCM services at any time (effective at the end of the month) by phone call to the office staff. 6. The patient will be responsible for cost sharing (co-pay) of up to 20% of the service fee (after annual deductible is met).  Patient did not agree to services but will consider and agreed to follow up call in 2 weeks.    The patient verbalized understanding of instructions provided today and declined a print copy of patient instruction materials.   [name].Cornell Gaber MSW, LCSW Licensed Clinical Social Worker [facility] [phone]

## 2018-04-13 NOTE — Chronic Care Management (AMB) (Signed)
  Chronic Care Management   Note  04/13/2018 Name: JOVAHN BREIT MRN: [id number] DOB: January 16, 1951   Telephone outreach today in response to referral from Dr. Warrick Parisian for assistance with chronic care management services related to Depression.  Mr. Wain and spouse , [name], were given information about Chronic Care Management services today including:  1. CCM service includes personalized support from designated clinical staff supervised by his physician, including individualized plan of care and coordination with other care providers 2. 24/7 contact phone numbers for assistance for urgent and routine care needs. 3. Service will only be billed when office clinical staff spend 20 minutes or more in a month to coordinate care. 4. Only one practitioner may furnish and bill the service in a calendar month. 5. The patient may stop CCM services at any time (effective at the end of the month) by phone call to the office staff. 6. The patient will be responsible for cost sharing (co-pay) of up to 20% of the service fee (after annual deductible is met).  Patient did not agree to services but wishes to consider and agreed to follow up call in next 2 weeks.   Plan:  LCSW will outreach to patient/spouse via phone over next 2 weeks.  [name].Shaindy Reader MSW, LCSW Licensed Clinical Social Worker [facility] Care Management [phone]

## 2018-04-25 ENCOUNTER — Other Ambulatory Visit: Payer: Self-pay | Admitting: *Deleted

## 2018-04-25 DIAGNOSIS — Z91199 Patient's noncompliance with other medical treatment and regimen due to unspecified reason: Secondary | ICD-10-CM

## 2018-04-25 DIAGNOSIS — F339 Major depressive disorder, recurrent, unspecified: Secondary | ICD-10-CM

## 2018-04-25 DIAGNOSIS — J449 Chronic obstructive pulmonary disease, unspecified: Secondary | ICD-10-CM

## 2018-04-25 DIAGNOSIS — R531 Weakness: Secondary | ICD-10-CM

## 2018-04-25 DIAGNOSIS — I10 Essential (primary) hypertension: Secondary | ICD-10-CM

## 2018-04-25 DIAGNOSIS — Z8673 Personal history of transient ischemic attack (TIA), and cerebral infarction without residual deficits: Secondary | ICD-10-CM

## 2018-04-25 DIAGNOSIS — R296 Repeated falls: Secondary | ICD-10-CM

## 2018-04-25 DIAGNOSIS — Z9119 Patient's noncompliance with other medical treatment and regimen: Secondary | ICD-10-CM

## 2018-04-25 NOTE — Progress Notes (Signed)
CCM services considered at last PCP appointment but order was not placed.

## 2018-04-27 ENCOUNTER — Telehealth: Payer: Self-pay

## 2018-05-08 ENCOUNTER — Ambulatory Visit (INDEPENDENT_AMBULATORY_CARE_PROVIDER_SITE_OTHER): Payer: Medicare HMO | Admitting: Licensed Clinical Social Worker

## 2018-05-08 DIAGNOSIS — Z8673 Personal history of transient ischemic attack (TIA), and cerebral infarction without residual deficits: Secondary | ICD-10-CM

## 2018-05-08 DIAGNOSIS — F339 Major depressive disorder, recurrent, unspecified: Secondary | ICD-10-CM

## 2018-05-08 DIAGNOSIS — Z91199 Patient's noncompliance with other medical treatment and regimen due to unspecified reason: Secondary | ICD-10-CM

## 2018-05-08 DIAGNOSIS — R531 Weakness: Secondary | ICD-10-CM

## 2018-05-08 DIAGNOSIS — Z9119 Patient's noncompliance with other medical treatment and regimen: Secondary | ICD-10-CM

## 2018-05-08 NOTE — Patient Instructions (Signed)
Licensed Clinical Social Worker Visit Information  Materials provided: No  Mr. Deleo was given information about Chronic Care Management services today including:  1. CCM service includes personalized support from designated clinical staff supervised by his physician, including individualized plan of care and coordination with other care providers 2. 24/7 contact phone numbers for assistance for urgent and routine care needs. 3. Service will only be billed when office clinical staff spend 20 minutes or more in a month to coordinate care. 4. Only one practitioner may furnish and bill the service in a calendar month. 5. The patient may stop CCM services at any time (effective at the end of the month) by phone call to the office staff. 6. The patient will be responsible for cost sharing (co-pay) of up to 20% of the service fee (after annual deductible is met).  Patient agreed to services and verbal consent obtained.    Current Barriers:   Difficulty walking (uses cane to walk)  Medication costs can be difficult  Fatigues easily (takes rest breaks)  Hygiene challenges  Goal :  Patient Stated:  "I want to talk to someone about my depression"  Clinical Social Work Clinical Goal(s): Over the next 30 days, client will work with LCSW to address concerns related to depression and management of depression symptoms of client.   Interventions:   Client interviewed related to psychosocial needs and appropriate assessments performed   LCSW talked with client and spouse about CCM program services provision  LCSW talked with client about managing depression symptoms of client  LCSW encouraged client or spouse to talk with RN CM [name] regarding nursing needs of client  Patient Self Care Activities:   Attends all scheduled provider appointments  Takes medications as prescribed  Drives self to appointments.  Plan:   LCSW to communicate with client in next 2 weeks to discuss  depression symptoms of client   Client to call LCSW as needed to discuss depression issues of client  Client/spouse to communicate with RN CM [name] to discuss nursing needs of client  Client and LCSW to talk about ways client can use relaxation techniques to manage depression symptoms   *initial goal documentation  Follow Up Plan: LCSW to call client in next 2 weeks to discuss depression symptoms of client and management of depression symptoms faced by client.    The patient verbalized understanding of instructions provided today and declined a print copy of patient instruction materials.    [name].Dezi Schaner MSW, LCSW Licensed Clinical Social Worker [facility] Care Management [phone]

## 2018-05-08 NOTE — Chronic Care Management (AMB) (Signed)
  Chronic Care Management    Clinical Social Work General Note  05/08/2018 Name: RAZI HICKLE MRN: [id number] DOB: [birth year]   Referred by: PCP, Dr.Joshua Dettinger for psychosocial assessment  Mr. Misiaszek was given information about Chronic Care Management services today including:  1. CCM service includes personalized support from designated clinical staff supervised by his physician, including individualized plan of care and coordination with other care providers 2. 24/7 contact phone numbers for assistance for urgent and routine care needs. 3. Service will only be billed when office clinical staff spend 20 minutes or more in a month to coordinate care. 4. Only one practitioner may furnish and bill the service in a calendar month. 5. The patient may stop CCM services at any time (effective at the end of the month) by phone call to the office staff. 6. The patient will be responsible for cost sharing (co-pay) of up to 20% of the service fee (after annual deductible is met).  Patient agreed to services and verbal consent obtained.   Review of patient status, including review of consultants reports, relevant laboratory and other test results, and collaboration with appropriate care team members and the patient's provider was performed as part of comprehensive patient evaluation and provision of chronic care management services.    Last CCM Appointment: 05/08/2018   Depression screen [facility] 2/9 05/08/2018 04/06/2018 01/03/2018  Decreased Interest 1 0 1  Down, Depressed, Hopeless 2 0 2  PHQ - 2 Score 3 0 3  Altered sleeping 1 - 2  Tired, decreased energy 2 - 2  Change in appetite 0 - 1  Feeling bad or failure about yourself  0 - 2  Trouble concentrating 1 - 2  Moving slowly or fidgety/restless 2 - 1  Suicidal thoughts 0 - 3  PHQ-9 Score 9 - 16  Difficult doing work/chores Somewhat difficult - -    Current Barriers:   Difficulty walking (uses cane to walk)  Medication costs can be  difficult  Fatigues easily (takes rest breaks)  Hygiene challenges  Goal :  Patient Stated:  "I want to talk to someone about my depression"  Clinical Social Work Clinical Goal(s): Over the next 30 days, client will work with LCSW to address concerns related to depression and management of depression symptoms of client.   Interventions:   Client interviewed related to psychosocial needs and appropriate assessments performed   LCSW talked with client and spouse about CCM program services provision  LCSW talked with client about managing depression symptoms of client  LCSW encouraged client or spouse to talk with RN CM [name] regarding nursing needs of client  Patient Self Care Activities:  . Attends all scheduled provider appointments  Takes medications as prescribed  Drives self to appointments.  Plan:  . LCSW to communicate with client in next 2 weeks to discuss depression symptoms of client  . Client to call LCSW as needed to discuss depression issues of client . Client/spouse to communicate with RN CM [name] to discuss nursing needs of client . Client and LCSW to talk about ways client can use relaxation techniques to manage depression symptoms   *initial goal documentation  Follow Up Plan: LCSW to call client in next 2 weeks to discuss depression symptoms of client and management of depression symptoms faced by client.   [name].Armiyah Capron MSW, LCSW Licensed Clinical Social Worker [facility] Care [facility] [phone]

## 2018-05-28 ENCOUNTER — Ambulatory Visit: Payer: Self-pay | Admitting: Licensed Clinical Social Worker

## 2018-05-28 DIAGNOSIS — F339 Major depressive disorder, recurrent, unspecified: Secondary | ICD-10-CM

## 2018-05-28 DIAGNOSIS — Z8673 Personal history of transient ischemic attack (TIA), and cerebral infarction without residual deficits: Secondary | ICD-10-CM

## 2018-05-28 DIAGNOSIS — Z9119 Patient's noncompliance with other medical treatment and regimen: Secondary | ICD-10-CM

## 2018-05-28 DIAGNOSIS — Z91199 Patient's noncompliance with other medical treatment and regimen due to unspecified reason: Secondary | ICD-10-CM

## 2018-05-28 DIAGNOSIS — R531 Weakness: Secondary | ICD-10-CM

## 2018-05-28 DIAGNOSIS — R296 Repeated falls: Secondary | ICD-10-CM

## 2018-05-28 NOTE — Patient Instructions (Addendum)
Licensed Clinical Water engineer Provided:  No  Goals we discussed today:    Current Barriers:  Difficulty walking (uses cane to walk)  Medication costs can be difficult  Fatigues easily (takes rest breaks)  Hygiene challenges  Smokes daily  Goal : Patient Stated: "I want to talk to someone about my depression"  Clinical Social Work Clinical Goal(s):Over the next30days, client will work with LCSW to address concerns related to depression and management of depression symptoms of client.  Interventions:  LCSW talked with clientand spouse about CCM program services provision  LCSW talked with client/spouse about managing depression symptoms of client  LCSW encouraged client or spouse to talk with RN CM Walter [name] regarding nursing needs of client  LCSW encouraged that client socialize with family or friends as a means of managing depression symptoms  LCSW encouraged that client use relaxation techniques to manage depression symptoms  (going shopping, watching favorite TV shows, visiting with his sister, running errands in the community)  Patient Self Care Activities:  Attends all scheduled provider appointments  Takes medications as prescribed  Drives self to appointments.  Plan:  LCSW to communicate with client /spouse in next 3 weeks to discuss management of depression symptoms of client   Client/spouse to call LCSW as needed to discuss depression issues of client  Client/spouse to communicate with RN CM Walter [name] to discuss nursing needs of client  Clientand LCSW to talk about ways client can use relaxation techniques to manage depression symptomsof client  *initial goal documentation  Follow Up Plan:LCSW to call client/spouse of client in next 3 weeks to discuss depression symptoms of client and management of depression symptoms faced by client.  The patient verbalized understanding of instructions  provided today and declined a print copy of patient instruction materials.       Walter [name] MSW, LCSW Licensed Clinical Social Worker [facility] Care Management [phone]

## 2018-05-28 NOTE — Chronic Care Management (AMB) (Signed)
  Chronic Care Management    Clinical Social Work General Follow Up Note  05/28/2018 Name: Walter [name] MRN: [id number] DOB: 01/27/1951  Referred by: PCP, Dr.Joshua A Dettinger for psychosocial assessment  Review of patient status, including review of consultants reports, relevant laboratory and other test results, and collaboration with appropriate care team members and the patient's provider was performed as part of comprehensive patient evaluation and provision of chronic care management services.    Last CCM Appointment: 05/28/2018  Depression screen PHQ 2/9 05/08/2018  Decreased Interest 1  Down, Depressed, Hopeless 2  PHQ - 2 Score 3  Altered sleeping 1  Tired, decreased energy 2  Change in appetite 0  Feeling bad or failure about yourself  0  Trouble concentrating 1  Moving slowly or fidgety/restless 2  Suicidal thoughts 0  PHQ-9 Score 9  Difficult doing work/chores Somewhat difficult    Regarding Social Determinants of Health, client is at risk of tobacco exposure (current smoker), has depression symptoms (cries easily per information from spouse), and client is not currently engaging in exercise program. Spouse reports client has hygiene issues, often refuses to bath. Will do sponge baths occasionally when he has appointment outside of home.  Current Barriers:   Difficulty walking (uses cane to walk)  Medication costs can be difficult  Fatigues easily (takes rest breaks)  Hygiene challenges  Smokes daily  Goal :  Patient Stated:  "I want to talk to someone about my depression"  Clinical Social Work Clinical Goal(s): Over the next 30 days, client will work with LCSW to address concerns related to depression and management of depression symptoms of client.   Interventions:   LCSW talked with client and spouse about CCM program services provision  LCSW talked with client/spouse about managing depression symptoms of client  LCSW encouraged client or spouse to  talk with RN CM [name] regarding nursing needs of client  LCSW encouraged that client socialize with family or friends as a means of managing depression symptoms  LCSW encouraged that client use relaxation techniques to manage depression symptoms  (going shopping, watching favorite TV shows, visiting with his sister, running errands in the community)  Patient Self Care Activities:   Attends all scheduled provider appointments  Takes medications as prescribed  Drives self to appointments.  Plan:   LCSW to communicate with client /spouse in next 3 weeks to discuss management of depression symptoms of client   Client/spouse to call LCSW as needed to discuss depression issues of client  Client/spouse to communicate with RN CM [name] to discuss nursing needs of client  Client and LCSW to talk about ways client can use relaxation techniques to manage depression symptoms of client  *initial goal documentation  Follow Up Plan: LCSW to call client/spouse of client in next 3 weeks to discuss depression symptoms of client and management of depression symptoms faced by client.    [name].Anjeanette Petzold MSW, LCSW Licensed Clinical Social Worker [facility] Care Management [phone]

## 2018-06-03 ENCOUNTER — Other Ambulatory Visit: Payer: Self-pay | Admitting: Family Medicine

## 2018-06-13 ENCOUNTER — Ambulatory Visit: Payer: Medicare HMO | Admitting: *Deleted

## 2018-06-13 DIAGNOSIS — J449 Chronic obstructive pulmonary disease, unspecified: Secondary | ICD-10-CM

## 2018-06-13 DIAGNOSIS — Z8673 Personal history of transient ischemic attack (TIA), and cerebral infarction without residual deficits: Secondary | ICD-10-CM

## 2018-06-13 DIAGNOSIS — I10 Essential (primary) hypertension: Secondary | ICD-10-CM

## 2018-06-13 DIAGNOSIS — R296 Repeated falls: Secondary | ICD-10-CM

## 2018-06-13 DIAGNOSIS — R531 Weakness: Secondary | ICD-10-CM

## 2018-06-13 DIAGNOSIS — F339 Major depressive disorder, recurrent, unspecified: Secondary | ICD-10-CM

## 2018-06-14 NOTE — Chronic Care Management (AMB) (Signed)
   Chronic Care Management   Follow Up Note   06/14/2018 Name: Walter [name] MRN: [id number] DOB: 31-Jul-1950  Referred by: Dettinger, Fransisca Kaufmann, MD Reason for referral : Chronic Care Management (RNCM initial telephone outreach)   Walter [name] is a 68 y.o. year old male who is a primary care patient of Dettinger, Fransisca Kaufmann, MD. The CCM team was consulted for assistance with chronic disease management and care coordination needs.    Review of patient status, including review of consultants reports, relevant laboratory and other test results, and collaboration with appropriate care team members and the patient's provider was performed as part of comprehensive patient evaluation and provision of chronic care management services.    Walter [name] and his wife, [name], have been speaking with [name], LCSW and he recommended that I speak with them regarding CCM nursing needs. Initial telephone outreach completed today. I spoke with [name], who reported that she can see an improvement in his mood since the weather has warmed up and he has been able to get outside and work in the yard some.    Goals Addressed            This Visit's Progress   . "I want to make sure this place on his eye doesn't get bigger" (pt-stated)       Current Barriers:  [name] Knowledge Deficits related to cause and treatment options of eye lesion . Cognitive Deficits  Nurse Case Manager Clinical Goal(s):   Over the next 30 days, patient will keep scheduled appointment with PCP and discuss changes in eyelid lesion  Interventions:  . Advised patient to call if there are any changes in eye lesion before evaluation  . Discussed changes in lesion with wife. She reports a dark mole like lesion on his left eyelid that has gotten larger since his last visit. [name] RNCM will collaborate with PCP regarding changes in eyelid lesion  Patient Self Care Activities:  . Currently UNABLE TO independently perform all IADLs and ADLs. Has  assistance from wife.  Initial goal documentation 06/14/2018 [name], RN          Plan  Patient will keep appointment with PCP in April and will f/u sooner if necessary.  RNCM will have face to face visit with patient after his visit on 07/16/2018 to discuss self management of chronic medical conditions and to set patient/nursing goals  RNCM provided patient/wife with CCM contact information and advised to reach out with any chronic care needs   [name], RN-BC, BSN Nurse Case Manager [place] [phone]

## 2018-06-14 NOTE — Patient Instructions (Signed)
Visit Information  Goals Addressed            This Visit's Progress   . "I want to make sure this place on his eye doesn't get bigger" (pt-stated)       Current Barriers:  [name] Knowledge Deficits related to cause and treatment options of eye lesion . Cognitive Deficits  Nurse Case Manager Clinical Goal(s):   Over the next 30 days, patient will keep scheduled appointment with PCP and discuss changes in eyelid lesion  Interventions:  . Advised patient to call if there are any changes in eye lesion before evaluation  . Discussed changes in lesion with wife. She reports a dark mole like lesion on his left eyelid that has gotten larger since his last visit. [name] RNCM will collaborate with PCP regarding changes in eyelid lesion  Patient Self Care Activities:  . Currently UNABLE TO independently perform all IADLs and ADLs. Has assistance from wife.  Initial goal documentation 06/14/2018 [name], RN         The patient verbalized understanding of instructions provided today and declined a print copy of patient instruction materials.   Next PCP appointment scheduled for:  07/16/2018. RNCM to see patient after appointment.   [name], RN-BC, BSN Nurse Case Manager [place] [phone]

## 2018-06-15 ENCOUNTER — Ambulatory Visit (INDEPENDENT_AMBULATORY_CARE_PROVIDER_SITE_OTHER): Payer: Medicare HMO | Admitting: Licensed Clinical Social Worker

## 2018-06-15 DIAGNOSIS — F339 Major depressive disorder, recurrent, unspecified: Secondary | ICD-10-CM

## 2018-06-15 DIAGNOSIS — I10 Essential (primary) hypertension: Secondary | ICD-10-CM

## 2018-06-15 DIAGNOSIS — Z9119 Patient's noncompliance with other medical treatment and regimen: Secondary | ICD-10-CM

## 2018-06-15 DIAGNOSIS — R531 Weakness: Secondary | ICD-10-CM

## 2018-06-15 DIAGNOSIS — Z8673 Personal history of transient ischemic attack (TIA), and cerebral infarction without residual deficits: Secondary | ICD-10-CM

## 2018-06-15 DIAGNOSIS — Z91199 Patient's noncompliance with other medical treatment and regimen due to unspecified reason: Secondary | ICD-10-CM

## 2018-06-15 NOTE — Patient Instructions (Signed)
Licensed Clinical Social Worker Visit Information  Goals we discussed today:  Goals Addressed            This Visit's Progress   . "Client said he wanted to talk to someone about depression and managing depression" (pt-stated)       Current Barriers:  . Difficulty walking (uses cane to walk) . Medication costs can be difficult . Fatigues easily . Hygiene challenges . Smokes daily  Clinical Social Work Clinical Goal(s):  [name] Over the next 30 days, client will work with SW to address concerns related to depression and mangement of depression  symptoms of client  Interventions: . Patient interviewed and appropriate assessments performed  . LCSW talked with client about managing depressin symptoms of client  . Encouraged client or spouse of client to talk with RN CM regarding nursing needs of client . Encouraged that client socialize with family or friends as a way to help manage depression symptoms . Encouraged client to use relaxation techniques to help manage depression symptoms  Patient Self Care Activities:  . Attends scheduled provider appointments . Takes medications as prescribed . Drives self to appointments  Plan:  LCSW to communicate with client/spouse in 3 weeks to discuss management of depression symptoms of client  Client/spouse to call LCSW as needed to discuss depression issues of client  Client/spouse to tak with RN CM [name] to discuss nursing needs of client  Client to use relaxation techniques to help manage depression symptoms.  Initial goal documentation     Materials Provided: No  Follow Up Plan: LCSW to talk with client or spouse of client in next 3 weeks to talk about management of depression symptoms of client  The patient verbalized understanding of instructions provided today and declined a print copy of patient instruction materials.   [name].Chery Giusto MSW, LCSW Licensed Clinical Social Worker [facility]  Care Management [phone]

## 2018-06-15 NOTE — Chronic Care Management (AMB) (Signed)
  Care Management Note   Walter [name] is a 68 y.o. year old male who is a primary care patient of Walter [name], Walter Kaufmann, MD. The CM team was consult for assistance with chronic disease management and care coordination.   I reached out to Walter [name] by phone today.   Review of patient status, including review of consultants reports, relevant laboratory and other test results, and collaboration with appropriate care team members and the patient's provider was performed as part of comprehensive patient evaluation and provision of chronic care management services.   Social Determinants of Health: Client at risk for tobacco exposure and Depression . Wife of client said she thought that client smoked about one half pack of cigarettes per day  Goals Addressed            This Visit's Progress   . "Client said he wanted to talk to someone about depression and managing depression" (pt-stated)       Current Barriers:  . Difficulty walking (uses cane to walk) . Medication costs can be difficult . Fatigues easily . Hygiene challenges . Smokes daily  Clinical Social Work Clinical Goal(s):  Walter [name] Kitchen Over the next 30 days, client will work with SW to address concerns related to depression and mangement of depression  symptoms of client  Interventions: .  LCSW talked with client about managing depression symptoms of client  . Encouraged client or spouse of client to talk with RN CM regarding nursing needs of client . Encouraged that client socialize with family or friends as a way to help manage depression symptoms . Encouraged client to use relaxation techniques to help manage depression symptoms (likes sitting outdoors on pretty weather days, doing errands in community, visiting with sister)  Patient Self Care Activities:  . Attends scheduled provider appointments . Takes medications as prescribed . Drives self to appointments  Plan:  LCSW to communicate with client/spouse in 3 weeks to discuss  management of depression symptoms of client  Client/spouse to call LCSW as needed to discuss depression issues of client  Client/spouse to tak with RN CM Walter [name] to discuss nursing needs of client  Client to use relaxation techniques to help manage depression symptoms.  Initial goal documentation    Follow Up Plan: LCSW to talk with client/spouse in next 3 weeks to discuss management of depression symptoms of client  [name].[name] MSW, LCSW Licensed Clinical Social Worker [name] [phone]

## 2018-07-06 ENCOUNTER — Ambulatory Visit (INDEPENDENT_AMBULATORY_CARE_PROVIDER_SITE_OTHER): Payer: Medicare HMO | Admitting: Licensed Clinical Social Worker

## 2018-07-06 DIAGNOSIS — Z9119 Patient's noncompliance with other medical treatment and regimen: Secondary | ICD-10-CM

## 2018-07-06 DIAGNOSIS — I10 Essential (primary) hypertension: Secondary | ICD-10-CM

## 2018-07-06 DIAGNOSIS — F339 Major depressive disorder, recurrent, unspecified: Secondary | ICD-10-CM | POA: Diagnosis not present

## 2018-07-06 DIAGNOSIS — Z8673 Personal history of transient ischemic attack (TIA), and cerebral infarction without residual deficits: Secondary | ICD-10-CM

## 2018-07-06 DIAGNOSIS — R531 Weakness: Secondary | ICD-10-CM

## 2018-07-06 DIAGNOSIS — Z91199 Patient's noncompliance with other medical treatment and regimen due to unspecified reason: Secondary | ICD-10-CM

## 2018-07-06 NOTE — Patient Instructions (Signed)
Licensed Clinical Social Worker Visit Information  Goals we discussed today:  Goals Addressed            This Visit's Progress   . "Client said he wanted to talk to someone about depression and managing depression" (pt-stated)       Current Barriers:  . Difficulty walking (uses cane to walk) . Medication costs can be difficult . Fatigues easily . Hygiene challenges . Smokes daily  Clinical Social Work Clinical Goal(s):  [name] Over the next 30 days, client will work with LCSW to address concerns related to depression and mangement of depression  symptoms of client  Interventions: . LCSW talked with client/spouse of client about managing depression symptoms of client  . Encouraged client or spouse of client to talk with RN CM regarding nursing needs of client . Encouraged that client socialize with family or friends as a way to help manage depression symptoms  .  Encouraged client to use relaxation techniques to help manage depression symptoms (watch TV, walking outside, listen to music)   Talked with client about client's completion of ADLs  Patient Self Care Activities:  . Attends scheduled provider appointments . Takes medications as prescribed . Drives self to appointments  Plan:  LCSW to communicate with client/spouse in 3 weeks to discuss management of depression symptoms of client  Client/spouse to call LCSW as needed to discuss depression issues of client  Client/spouse to tak with RN CM [name] to discuss nursing needs of client  Client to use relaxation techniques to help manage depression symptoms. (listen to music, walk outdoors, watch TV)  Talked with client about client completion of ADLs.  Initial goal documentation    Materials Provided: No  Follow Up Plan: LCSW to communicate with client/spouse of client in next 3 weeks to discuss management of depression symptoms of client  The patient verbalized understanding of instructions provided today and  declined a print copy of patient instruction materials.   [name].Alexandrina Fiorini MSW, LCSW Licensed Clinical Social Worker [facility] Care [facility] [phone]

## 2018-07-06 NOTE — Chronic Care Management (AMB) (Signed)
  Care Management Note   Walter [name] is a 68 y.o. year old male who is a primary care patient of Dettinger, Fransisca Kaufmann, MD. The CM team was consulted for assistance with chronic disease management and care coordination.   I reached out to Walter [name] by phone today.   Review of patient status, including review of consultants reports, relevant laboratory and other test results, and collaboration with appropriate care team members and the patient's provider was performed as part of comprehensive patient evaluation and provision of chronic care management services.   Social Determinants of Health:Risk for tobacco exposure; Risk for Depression. Has difficulty performing ADLs. Risk for falls.    Chronic Care Management from 05/08/2018 in [place]  PHQ-9 Total Score  9     Goals Addressed            This Visit's Progress   . "Client said he wanted to talk to someone about depression and managing depression" (pt-stated)       Current Barriers:  . Difficulty walking (uses cane to walk) . Medication costs can be difficult to pay . Fatigues easily . Hygiene challenges . Smokes daily  Clinical Social Work Clinical Goal(s):  [name] Over the next 30 days, client will work with LCSW to address concerns related to depression and mangement of depression  symptoms of client  Interventions: . LCSW talked with client/spouse of client about managing depression symptoms of client  . Encouraged client or spouse of client to talk with RN CM regarding nursing needs of client . Encouraged that client socialize with family or friends as a way to help manage depression symptoms  .  Encouraged client to use relaxation techniques to help manage depression symptoms (watch TV, walking outside, listen to music)   Talked with client about client's completion of ADLs  Patient Self Care Activities:  . Attends scheduled provider appointments . Takes medications as prescribed . Drives self  to appointments  Plan:  LCSW to communicate with client/spouse in 3 weeks to discuss management of depression symptoms of client  Client/spouse to call LCSW as needed to discuss depression issues of client  Client/spouse to tak with RN CM [name] to discuss nursing needs of client  Client to use relaxation techniques to help manage depression symptoms. (listen to music, walk outdoors, watch TV)  Client to attend scheduled medical appointments  Initial goal documentation    Follow Up Plan: LCSW to communicate with client/spouse of client in next 3 weeks to discuss management of depression symptoms of client  [name].Abed Schar MSW, LCSW Licensed Clinical Social Worker [facility] Care Management [phone]

## 2018-07-09 ENCOUNTER — Other Ambulatory Visit: Payer: Self-pay

## 2018-07-09 ENCOUNTER — Ambulatory Visit: Payer: Medicare HMO | Admitting: *Deleted

## 2018-07-09 ENCOUNTER — Encounter: Payer: Self-pay | Admitting: Family Medicine

## 2018-07-09 ENCOUNTER — Ambulatory Visit (INDEPENDENT_AMBULATORY_CARE_PROVIDER_SITE_OTHER): Payer: Medicare HMO | Admitting: Family Medicine

## 2018-07-09 DIAGNOSIS — I10 Essential (primary) hypertension: Secondary | ICD-10-CM

## 2018-07-09 DIAGNOSIS — J449 Chronic obstructive pulmonary disease, unspecified: Secondary | ICD-10-CM

## 2018-07-09 DIAGNOSIS — F339 Major depressive disorder, recurrent, unspecified: Secondary | ICD-10-CM | POA: Diagnosis not present

## 2018-07-09 DIAGNOSIS — E782 Mixed hyperlipidemia: Secondary | ICD-10-CM

## 2018-07-09 DIAGNOSIS — R296 Repeated falls: Secondary | ICD-10-CM

## 2018-07-09 MED ORDER — AMLODIPINE BESYLATE 5 MG PO TABS: 5.0000 mg | ORAL_TABLET | Freq: Every day | ORAL | 3 refills | Status: DC

## 2018-07-09 MED ORDER — BUPROPION HCL ER (XL) 300 MG PO TB24: 300.0000 mg | ORAL_TABLET | Freq: Every day | ORAL | 3 refills | Status: DC

## 2018-07-09 MED ORDER — ATORVASTATIN CALCIUM 40 MG PO TABS: 40.0000 mg | ORAL_TABLET | Freq: Every day | ORAL | 3 refills | Status: DC

## 2018-07-09 MED ORDER — CLOPIDOGREL BISULFATE 75 MG PO TABS: 75.0000 mg | ORAL_TABLET | Freq: Every day | ORAL | 3 refills | Status: DC

## 2018-07-09 NOTE — Progress Notes (Signed)
Virtual Visit via telephone Note  I connected with Walter [name] on 07/09/18 at [place] by telephone and verified that I am speaking with the correct person using two identifiers. Walter [name] is currently located at home and wife are currently with her during visit. The provider, Fransisca Kaufmann Sheralee Qazi, MD is located in their office at time of visit.  Call ended at [phone]  I discussed the limitations, risks, security and privacy concerns of performing an evaluation and management service by telephone and the availability of in person appointments. I also discussed with the patient that there may be a patient responsible charge related to this service. The patient expressed understanding and agreed to proceed.   History and Present Illness: Hypertension Patient is currently on amlodipine, and their blood pressure is unknown because. Patient denies any lightheadedness or dizziness. Patient denies headaches, blurred vision, chest pains, shortness of breath, or weakness. Denies any side effects from medication and is content with current medication.   Hyperlipidemia Patient is coming in for recheck of his hyperlipidemia. The patient is currently taking lovastatin. They deny any issues with myalgias or history of liver damage from it. They deny any focal numbness or weakness or chest pain.   Depression Patient is currently taking bupropion 300 and it seems to help some.  He is quiet but does not seem to have as many or as severe of outbursts.  Most of the discussion on this visit with with the wife because the patient wanted [place] to discuss this with her.  She says that she manages most of his medications anyways  No diagnosis found.  Outpatient Encounter Medications as of 07/09/2018  Medication Sig  . amLODipine (NORVASC) 5 MG tablet Take 1 tablet (5 mg total) by mouth daily.  [name] atorvastatin (LIPITOR) 40 MG tablet Take 1 tablet (40 mg total) by mouth daily.  [name] buPROPion (WELLBUTRIN XL) 300 MG 24 hr  tablet TAKE 1 TABLET BY MOUTH EVERY DAY  . clopidogrel (PLAVIX) 75 MG tablet Take 1 tablet (75 mg total) by mouth daily.   No facility-administered encounter medications on file as of 07/09/2018.     Review of Systems  Constitutional: Negative for chills and fever.  Respiratory: Negative for shortness of breath and wheezing.   Cardiovascular: Negative for chest pain and leg swelling.  Musculoskeletal: Negative for back pain and gait problem.  Skin: Negative for rash.       Patient's wife says he has this lesion on his eye that is been growing and it is brown and she is concerned that it is a skin cancer right now she is trying to keep amount of the office if she can but if it continues to grow bigger she will bring him in  Psychiatric/Behavioral: Positive for dysphoric mood. Negative for self-injury, sleep disturbance and suicidal ideas. The patient is not nervous/anxious.   All other systems reviewed and are negative.   Observations/Objective: Patient sounds comfortable and wife says that he sounds comfortable as well and in no distress.  Assessment and Plan: Problem List Items Addressed This Visit      Cardiovascular and Mediastinum   HTN (hypertension) - Primary   Relevant Medications   amLODipine (NORVASC) 5 MG tablet   atorvastatin (LIPITOR) 40 MG tablet     Other   Depression, recurrent (HCC)   Relevant Medications   buPROPion (WELLBUTRIN XL) 300 MG 24 hr tablet   Hyperlipidemia, mixed   Relevant Medications   amLODipine (NORVASC) 5 MG  tablet   atorvastatin (LIPITOR) 40 MG tablet       Follow Up Instructions:  Follow-up in 3 months for depression and blood work   I discussed the assessment and treatment plan with the patient. The patient was provided an opportunity to ask questions and all were answered. The patient agreed with the plan and demonstrated an understanding of the instructions.   The patient was advised to call back or seek an in-person evaluation if the  symptoms worsen or if the condition fails to improve as anticipated.  The above assessment and management plan was discussed with the patient. The patient verbalized understanding of and has agreed to the management plan. Patient is aware to call the clinic if symptoms persist or worsen. Patient is aware when to return to the clinic for a follow-up visit. Patient educated on when it is appropriate to go to the emergency department.    I provided 11 minutes of non-face-to-face time during this encounter.    Worthy Rancher, MD

## 2018-07-10 ENCOUNTER — Other Ambulatory Visit: Payer: Self-pay

## 2018-07-10 NOTE — Chronic Care Management (AMB) (Signed)
  Chronic Care Management   Note  07/10/2018 Name: Walter [name] MRN: [id number] DOB: [birth year]  [name] was scheduled to see Dr Dettinger on 07/16/2018 in the office but that appointment was changed to a telephone visit due to [id number] public health crisis and social distancing recommendations. His visit was with Dr Dettinger on 07/09/18 via telephone.   [name] talked with Dr Dettinger regarding the eyelid lesion and they plan to follow up as needed during this period of social distancing. They can schedule an appointment for an evaluation when necessary.   Since this issue along with [name] other chronic medical conditions, were recently addressed I will postpone my telephone follow up with him. During that visit we will need to address patient goals for fall prevention, COPD, HTN, and hyperlipidemia.   Follow up plan: The CM team will reach out to the patient again over the next 14 days.    [name], RN-BC, BSN Nurse Case Manager [place] [phone]

## 2018-07-24 ENCOUNTER — Telehealth: Payer: Medicare HMO

## 2018-07-25 ENCOUNTER — Telehealth: Payer: Medicare HMO

## 2018-07-26 ENCOUNTER — Telehealth: Payer: Medicare HMO

## 2018-07-26 ENCOUNTER — Ambulatory Visit: Payer: Medicare HMO | Admitting: Licensed Clinical Social Worker

## 2018-07-26 ENCOUNTER — Other Ambulatory Visit: Payer: Self-pay

## 2018-07-26 DIAGNOSIS — R531 Weakness: Secondary | ICD-10-CM

## 2018-07-26 DIAGNOSIS — F339 Major depressive disorder, recurrent, unspecified: Secondary | ICD-10-CM

## 2018-07-26 DIAGNOSIS — Z91199 Patient's noncompliance with other medical treatment and regimen due to unspecified reason: Secondary | ICD-10-CM

## 2018-07-26 DIAGNOSIS — Z9119 Patient's noncompliance with other medical treatment and regimen: Secondary | ICD-10-CM

## 2018-07-26 DIAGNOSIS — Z8673 Personal history of transient ischemic attack (TIA), and cerebral infarction without residual deficits: Secondary | ICD-10-CM

## 2018-07-26 DIAGNOSIS — Z720 Tobacco use: Secondary | ICD-10-CM

## 2018-07-26 DIAGNOSIS — I10 Essential (primary) hypertension: Secondary | ICD-10-CM

## 2018-07-26 NOTE — Chronic Care Management (AMB) (Signed)
  Care Management Note   Walter [name] is a 68 y.o. year old male who is a primary care patient of Dettinger, Fransisca Kaufmann, MD. The CM team was consulted for assistance with chronic disease management and care coordination.   I reached out to Walter [name] by phone today.   Review of patient status, including review of consultants reports, relevant laboratory and other test results, and collaboration with appropriate care team members and the patient's provider was performed as part of comprehensive patient evaluation and provision of chronic care management services.   Social Determinants of Health: Client smokes cigarettes daily; at risk for Depression; often is non compliant with hygiene (often resists bathing or resists changing clothes)    Chronic Care Management from 05/08/2018 in [place]  PHQ-9 Total Score  9     Goals Addressed            This Visit's Progress   . "Client said he wanted to talk to someone about depression and managing depression" (pt-stated)       Current Barriers:  . Difficulty walking (uses cane to walk) . Medication costs can be difficult . Fatigues easily . Hygiene challenges . Smokes daily  Clinical Social Work Clinical Goal(s):  [name] Over the next 30 days, client will work with LCSW to address concerns related to depression and mangement of depression  symptoms of client  Interventions: . LCSW talked with client/spouse of client about managing depression symptoms of client  . Encouraged client or spouse of client to talk with RN CM regarding nursing needs of client . Encouraged that client socialize with family or friends as a way to help manage depression symptoms   .   Encouraged client to use relaxation techniques to help manage depression symptoms (watch TV, walking outside, listen to music,visit with sister)  .  Talked with spouse of client about client's completion of ADLs  .  Talked with spouse of client about smoking of  client   Patient Self Care Activities:  . Attends scheduled provider appointments . Takes medications as prescribed . Drives self to appointments  Plan:  LCSW to communicate with client/spouse in 3 weeks to discuss management of depression symptoms of client  Client/spouse to call LCSW as needed to discuss depression issues of client  Client/spouse to tak with RN CM [name] to discuss nursing needs of client  Client to use relaxation techniques to help manage depression symptoms. (listen to music, walk outdoors, watch TV, visit with his sister)  Initial goal documentation    [name] reported to LCSW that client smokes about a pack of cigarettes daily.  She said that following a hospitalization he did not smoke as much but presently she thinks he smokes about a pack of cigarettes daily. She said he sometimes has a little trouble breathing. She spoke of his non compliance in bathing or his non compliance in changing clothes. She said he is eating adequately and sleeping adequately.  She spoke of growth area on client's eye. LCSW talked with her about CCM program support services. She said she had phone number of LCSW to call as needed.  Follow Up Plan: LCSW to call client or spouse of client in next 3 weeks to discuss management of depression symptoms of client.  [name].Lugene Hitt MSW, LCSW Licensed Clinical Social Worker [facility] Care Management [phone]

## 2018-07-26 NOTE — Patient Instructions (Signed)
Licensed Clinical Social Worker Visit Information  Goals we discussed today:  Goals Addressed            This Visit's Progress   . "Client said he wanted to talk to someone about depression and managing depression" (pt-stated)       Current Barriers:  . Difficulty walking (uses cane to walk) . Medication costs can be difficult . Fatigues easily . Hygiene challenges . Smokes daily  Clinical Social Work Clinical Goal(s):  Walter [name] Over the next 30 days, client will work with LCSW to address concerns related to depression and mangement of depression  symptoms of client  Interventions: . LCSW talked with client/spouse of client about managing depression symptoms of client  . Encouraged client or spouse of client to talk with RN CM regarding nursing needs of client .  Encouraged that client socialize with family or friends as a way to help manage depression symptoms    . Encouraged client to use relaxation techniques to help manage depression symptoms (watch TV, walking outside, listen to music,visit with sister)   . Talked with spouse of client about client's smoking habit  Talked with spouse of client about smoking of client Patient Self Care Activities:  . Attends scheduled provider appointments . Takes medications as prescribed . Drives self to appointments  Plan:  LCSW to communicate with client/spouse in 3 weeks to discuss management of depression symptoms of client  Client/spouse to call LCSW as needed to discuss depression issues of client  Client/spouse to tak with RN CM [name] to discuss nursing needs of client  Client to use relaxation techniques to help manage depression symptoms. (listen to music, walk outdoors, watch TV)  Talked with client about client completion of ADLs.  Initial goal documentation    Materials Provided: No  Follow Up Plan: LCSW to communicate with client or spouse of client in next 3 weeks to discuss management of depression symptoms of  client  The patient/spouse of patient verbalized understanding of instructions provided today and declined a print copy of patient instruction materials.   Walter [name].Walter [name] MSW, LCSW Licensed Clinical Social Worker [facility] Care Management [phone]

## 2018-07-27 ENCOUNTER — Telehealth: Payer: Medicare HMO

## 2018-08-02 ENCOUNTER — Other Ambulatory Visit: Payer: Self-pay

## 2018-08-02 ENCOUNTER — Telehealth: Payer: Medicare HMO | Admitting: *Deleted

## 2018-08-16 ENCOUNTER — Ambulatory Visit: Payer: Medicare HMO | Admitting: *Deleted

## 2018-08-16 ENCOUNTER — Ambulatory Visit (INDEPENDENT_AMBULATORY_CARE_PROVIDER_SITE_OTHER): Payer: Medicare HMO | Admitting: Licensed Clinical Social Worker

## 2018-08-16 DIAGNOSIS — H579 Unspecified disorder of eye and adnexa: Secondary | ICD-10-CM

## 2018-08-16 DIAGNOSIS — R531 Weakness: Secondary | ICD-10-CM

## 2018-08-16 DIAGNOSIS — F339 Major depressive disorder, recurrent, unspecified: Secondary | ICD-10-CM | POA: Diagnosis not present

## 2018-08-16 DIAGNOSIS — Z720 Tobacco use: Secondary | ICD-10-CM

## 2018-08-16 DIAGNOSIS — I10 Essential (primary) hypertension: Secondary | ICD-10-CM | POA: Diagnosis not present

## 2018-08-16 DIAGNOSIS — Z91199 Patient's noncompliance with other medical treatment and regimen due to unspecified reason: Secondary | ICD-10-CM

## 2018-08-16 DIAGNOSIS — Z8673 Personal history of transient ischemic attack (TIA), and cerebral infarction without residual deficits: Secondary | ICD-10-CM

## 2018-08-16 DIAGNOSIS — Z9119 Patient's noncompliance with other medical treatment and regimen: Secondary | ICD-10-CM

## 2018-08-16 NOTE — Chronic Care Management (AMB) (Signed)
  Chronic Care Management   Telephone Follow Up Note   08/16/2018 Name: FINNIGAN WARRINER MRN: [id number] DOB: November 26, 1950  Referred by: Dettinger, Fransisca Kaufmann, MD Reason for referral : Chronic Care Management (RN follow up regarding eye lesion)   IVAR DOMANGUE is a 68 y.o. year old male who is a primary care patient of Dettinger, Fransisca Kaufmann, MD. The CCM team was consulted for assistance with chronic disease management and care coordination needs.    Ms Schaible has been speaking with Legrand Como "Scott" Forrest, LCSW with the [facility] CCM Team regarding Reise's psychosocial needs. Scott requested that I call Ms Shaul today to address her concerns about a potential foot/toenail fungus and the use of OTC creams. She is also concerned about the eyelid lesion that we discussed previously. During previous office visit Dr Dettinger recommended that he come in for a visit if the lesion did not resolve or worsened.   I placed a follow-up telephone call to Mr. Peatross today but I was unable to speak with him. Mr Yeagley needs to be scheduled for a face-to-face visit with PCP regarding the eye lesion and foot fungus. An OTC like lotrimin or clotrimaozle may help if the fungus is on the skin but would provide little to no benefit if it is on the toenail.  A podiatry referral for foot care may be needed.    Follow Up Plan A HIPPA compliant phone message was left for the patient providing contact information and requesting a return call.   I will reach back out over the next 48 hours if I do not get a return call.   [name], RN-BC, BSN Nurse Care Manager [facility] [phone]

## 2018-08-16 NOTE — Patient Instructions (Signed)
Visit Information  Goals Addressed            This Visit's Progress     Patient Stated   . "I want something to help this toenail fungus to heal" (pt-stated)       Current Barriers:  [name] Knowledge Deficits related to cause and treatment of toenail fungus  Nurse Case Manager Clinical Goal(s):  [name] Over the next 2 days, patient will verbalize understanding of plan for treating toenail fungus . Over the next 7 days, patient will attend all scheduled medical appointments: PCP 08/17/18 and potential podiatry referral  Interventions:  . Provided education to patient re: typical toenail fungus treatment options and recommended podiatry referral to assess and treat thickening of toenails.   . Advised that podiatrist are able trim down the nail and are specially trained . Explained an oral medication is often necessary when the toenail is affected and that his liver may need to be monitored if started on either a topical or oral antifungal.   Patient Self Care Activities:  . Performs ADL's independently  Initial goal documentation      . "I want to make sure this place on his eye doesn't get bigger" (pt-stated)       Current Barriers:  [name] Knowledge Deficits related to cause and treatment options of eye lesion . Cognitive Deficits  Nurse Case Manager Clinical Goal(s):   Over the next 2 days, patient will keep appointment with PCP regarding eyelid lesion  Over the next 30 days, patient will follow provider's treatment recommendations for eyelid lesion  Interventions:   Reviewed chart  Consulted with patient's wife per LCSW request  Scheduled appointment with PCP  *follow up with PCP was changed to a telephone visit due to CHENI77 public health emergency           Print copy of patient instructions provided.   The CM team will reach out to the patient again over the next 7 days.  Next PCP appointment scheduled for: 08/17/2018   [name], RN-BC, BSN Nurse Care  Manager [place] [phone]

## 2018-08-16 NOTE — Chronic Care Management (AMB) (Signed)
  Chronic Care Management   Follow Up Note   08/16/2018 Name: Walter [name] MRN: [id number] DOB: 09-Nov-1950  Referred by: Dettinger, Fransisca Kaufmann, MD Reason for referral : Chronic Care Management (RN follow up regarding eye lesion)   Walter [name] is a 68 y.o. year old male who is a primary care patient of Dettinger, Fransisca Kaufmann, MD. The CCM team was consulted for assistance with chronic disease management and care coordination needs.    Review of patient status, including review of consultants reports, relevant laboratory and other test results, and collaboration with appropriate care team members and the patient's provider was performed as part of comprehensive patient evaluation and provision of chronic care management services.    I spoke with [name] by telephone regarding [name]'s eyelid lesion and foot/toenail fungus.   Goals Addressed      Patient Stated   . "I want something to help this toenail fungus to heal" (pt-stated)       Current Barriers:  [name] Knowledge Deficits related to cause and treatment of toenail fungus  Nurse Case Manager Clinical Goal(s):  [name] Over the next 2 days, patient will verbalize understanding of plan for treating toenail fungus . Over the next 7 days, patient will attend all scheduled medical appointments: PCP 08/17/18 and potential podiatry referral  Interventions:  . Provided education to patient re: typical toenail fungus treatment options and recommended podiatry referral to assess and treat thickening of toenails.   . Advised that podiatrist are able trim down the nail and are specially trained . Explained an oral medication is often necessary when the toenail is affected and that his liver may need to be monitored if started on either a topical or oral antifungal.   Patient Self Care Activities:  . Performs ADL's independently    . "I want to make sure this place on his eye doesn't get bigger" (pt-stated)       Current Barriers:  [name] Knowledge Deficits  related to cause and treatment options of eye lesion . Cognitive Deficits  Nurse Case Manager Clinical Goal(s):   Over the next 2 days, patient will keep appointment with PCP regarding eyelid lesion  Over the next 30 days, patient will follow provider's treatment recommendations for eyelid lesion  Interventions:   Reviewed chart  Consulted with patient's wife per LCSW request  Scheduled appointment with PCP       Follow Up Plan The CM team will reach out to the patient again over the next 14 days.  Next PCP appointment scheduled for: 08/17/18   [name], RN-BC, BSN Nurse Care Manager [place] [phone]

## 2018-08-16 NOTE — Chronic Care Management (AMB) (Signed)
Care Management Note   Walter [name] is a 68 y.o. year old male who is a primary care patient of Dettinger, Fransisca Kaufmann, MD. The CM team was consulted for assistance with chronic disease management and care coordination.   I reached out to [place] of client by phone today.    Review of patient status, including review of consultants reports, relevant laboratory and other test results, and collaboration with appropriate care team members and the patient's provider was performed as part of comprehensive patient evaluation and provision of chronic care management services.    Social Determinants of Health:Risk of tobacco exposure; risk for Depression    Chronic Care Management from 05/08/2018 in [place]  PHQ-9 Total Score  9     Goals Addressed            This Visit's Progress   . "Client said he wanted to talk to someone about depression and managing depression" (pt-stated)       Current Barriers:  . Difficulty walking (uses cane to walk) . Medication costs can be difficult . Fatigues easily . Hygiene challenges . Smokes daily  Clinical Social Work Clinical Goal(s):  [name] Over the next 30 days, client will work with LCSW to address concerns related to depression and mangement of depression  symptoms of client  Interventions: . LCSW talked with client/spouse of client about managing depression symptoms of client  . Encouraged client or spouse of client to talk with RN CM regarding nursing needs of client . Encouraged that client socialize with family or friends as a way to help manage depression symptoms   .   Encouraged client to use relaxation techniques to help manage depression symptoms (watch TV, walking outside, listen to music,visit with sister)   .  Talked with client about client's completion of ADLs    .  Talked with spouse of client about smoking of client (smokes about one pack of cigarettes per day)  Patient Self Care Activities:  .  Attends scheduled provider appointments . Takes medications as prescribed . Drives self to appointments  Plan:  LCSW to communicate with client/spouse in next 3 weeks to discuss management of depression symptoms of client  Client/spouse to call LCSW as needed to discuss depression issues of client  Client/spouse to tak with RN CM [name] to discuss nursing needs of client  Client to use relaxation techniques to help manage depression symptoms. (listen to music, walk outdoors, watch TV)  Initial goal documentation     [name] informed LCSW on 08/16/2018 that client still has raised place on his left eye. Raised placed on eye is dark in color and has some redness She said raised place is on client's eye lid and it looks like it is going through his eyelid perhaps. She is concerned about this issue on client's eye.  She said raised place is on the bottom corner of left eyelid  She said client also has areas on his back that are red or dark in color. [name] said regular client appointment with PCP is not scheduled until July of 2020. She said client has fungus on his feet.  Client is eating well. Client is sleeping well. Client has raised toenail on right foot. Client has prescribed medications  Follow Up Plan: LCSW to communicate with client or spouse of client in next 3 weeks to discuss management of depression symptoms of client  [name].Blondine Hottel MSW, LCSW Licensed Clinical  Social Worker [facility] Care Management [phone]

## 2018-08-16 NOTE — Patient Instructions (Signed)
Licensed Clinical Social Worker Visit Information  Goals we discussed today:  Goals Addressed            This Visit's Progress   . "Client said he wanted to talk to someone about depression and managing depression" (pt-stated)       Current Barriers:  . Difficulty walking (uses cane to walk) . Medication costs can be difficult . Fatigues easily . Hygiene challenges . Smokes daily  Clinical Social Work Clinical Goal(s):  [name] Over the next 30 days, client will work with LCSW to address concerns related to depression and mangement of depression  symptoms of client  Interventions: . LCSW talked with client/spouse of client about managing depression symptoms of client  . Encouraged client or spouse of client to talk with RN CM regarding nursing needs of client . Encouraged that client socialize with family or friends as a way to help manage depression symptoms   .   Encouraged client to use relaxation techniques to help manage depression symptoms (watch TV, walking outside, listen to music,visit with sister)   .  Talked with client about client's completion of ADLs    .  Talked with spouse of client about smoking of client   Patient Self Care Activities:  . Attends scheduled provider appointments . Takes medications as prescribed . Drives self to appointments  Plan:  LCSW to communicate with client/spouse in next 3 weeks to discuss management of depression symptoms of client  Client/spouse to call LCSW as needed to discuss depression issues of client  Client/spouse to tak with RN CM [name] to discuss nursing needs of client  Client to use relaxation techniques to help manage depression symptoms. (listen to music, walk outdoors, watch TV)  Talked with client about client completion of ADLs.  Initial goal documentation      Materials Provided: No  Follow Up Plan: LCSW to communicate with client or spouse of client in next 3 weeks to discuss management of depression  symptoms of client  The patient/spouse of patient verbalized understanding of instructions provided today and declined a print copy of patient instruction materials.   [name].Codee Bloodworth MSW, LCSW Licensed Clinical Social Worker [facility] [phone]

## 2018-08-17 ENCOUNTER — Ambulatory Visit (INDEPENDENT_AMBULATORY_CARE_PROVIDER_SITE_OTHER): Payer: Medicare HMO | Admitting: Family Medicine

## 2018-08-17 ENCOUNTER — Encounter: Payer: Self-pay | Admitting: Family Medicine

## 2018-08-17 VITALS — BP 134/81 | HR 76 | Temp 97.1°F | Ht 70.5 in | Wt 191.2 lb

## 2018-08-17 DIAGNOSIS — D229 Melanocytic nevi, unspecified: Secondary | ICD-10-CM

## 2018-08-17 DIAGNOSIS — B351 Tinea unguium: Secondary | ICD-10-CM

## 2018-08-17 MED ORDER — TERBINAFINE HCL 250 MG PO TABS: 250.0000 mg | ORAL_TABLET | Freq: Every day | ORAL | 1 refills | Status: DC

## 2018-08-17 NOTE — Progress Notes (Signed)
BP 134/81   Pulse 76   Temp (!) 97.1 F (36.2 C) (Oral)   Ht 5' 10.5" (1.791 m)   Wt 191 lb 3.2 oz (86.7 kg)   BMI 27.05 kg/m    Subjective:   Patient ID: Walter [name], male    DOB: 1950-07-14, 68 y.o.   MRN: [id number]  HPI: Walter [name] is a 68 y.o. male presenting on 08/17/2018 for Eyelid Lesion (Left eye- Wife states that it has been there a few months ) and foot fungus (left foot- Patient states it has been there awhile but not sure how long)   HPI Left eye lesion Patient has a lesion on his left eye that is been there over the past few months at least and his wife says it has been growing up towards his eye and she feels like it is going on the inside towards his eye as well.  She denies him having any pain or soreness with it and he denies any of that today.  He has dementia so most of the history comes from his wife.  She is not to do any drainage or redness or warmth with it but just that is brown and it looks like it is growing.  Thickened and yellowed toenails Patient has thickened and yellowed toenails on both of his great toes and the second toes on both feet.  The left great toe has become so thick that she cannot cut it anymore and wants to know if she can get a referral to podiatry or what else she can do for this.  Relevant past medical, surgical, family and social history reviewed and updated as indicated. Interim medical history since our last visit reviewed. Allergies and medications reviewed and updated.  Review of Systems  Constitutional: Negative for chills and fever.  Respiratory: Negative for shortness of breath and wheezing.   Cardiovascular: Negative for chest pain and leg swelling.  Musculoskeletal: Negative for back pain and gait problem.  Skin: Positive for color change. Negative for rash.  All other systems reviewed and are negative.   Per HPI unless specifically indicated above   Allergies as of 08/17/2018   No Known Allergies     Medication  List       Accurate as of Aug 17, 2018 11:06 AM. If you have any questions, ask your nurse or doctor.        amLODipine 5 MG tablet Commonly known as:  NORVASC Take 1 tablet (5 mg total) by mouth daily.   atorvastatin 40 MG tablet Commonly known as:  LIPITOR Take 1 tablet (40 mg total) by mouth daily.   buPROPion 300 MG 24 hr tablet Commonly known as:  WELLBUTRIN XL Take 1 tablet (300 mg total) by mouth daily.   clopidogrel 75 MG tablet Commonly known as:  PLAVIX Take 1 tablet (75 mg total) by mouth daily.   terbinafine 250 MG tablet Commonly known as:  LAMISIL Take 1 tablet (250 mg total) by mouth daily. Started by:  Walter Kaufmann Sherina Stammer, MD        Objective:   BP 134/81   Pulse 76   Temp (!) 97.1 F (36.2 C) (Oral)   Ht 5' 10.5" (1.791 m)   Wt 191 lb 3.2 oz (86.7 kg)   BMI 27.05 kg/m   Wt Readings from Last 3 Encounters:  08/17/18 191 lb 3.2 oz (86.7 kg)  04/06/18 190 lb (86.2 kg)  01/03/18 180 lb (81.6 kg)  Physical Exam Vitals signs and nursing note reviewed.  Constitutional:      General: He is not in acute distress.    Appearance: He is well-developed. He is not diaphoretic.  Eyes:     General: No scleral icterus.    Conjunctiva/sclera: Conjunctivae normal.  Neck:     Thyroid: No thyromegaly.  Skin:    General: Skin is warm and dry.     Findings: No rash.          Comments: Patient has thickened and yellowed toenails on both great toes and the second digits as well, his left great toe is about half a centimeter thick and his wife is unable to cut it any further.  Neurological:     Mental Status: He is alert and oriented to person, place, and time.     Coordination: Coordination normal.  Psychiatric:        Behavior: Behavior normal.       Assessment & Plan:   Problem List Items Addressed This Visit    None    Visit Diagnoses    Onychomycosis    -  Primary   Relevant Medications   terbinafine (LAMISIL) 250 MG tablet   Other  Relevant Orders   Ambulatory referral to Podiatry   Atypical nevus       Relevant Orders   Ambulatory referral to Dermatology      Will treat with terbinafine for onychomycosis and refer to podiatry  For the lesion on her eye will refer to dermatology Follow up plan: Return if symptoms worsen or fail to improve.  Counseling provided for all of the vaccine components Orders Placed This Encounter  Procedures  . Ambulatory referral to Podiatry  . Ambulatory referral to Dermatology    Caryl Pina, MD [facility] 08/17/2018, 11:06 AM

## 2018-08-23 DIAGNOSIS — C44519 Basal cell carcinoma of skin of other part of trunk: Secondary | ICD-10-CM | POA: Diagnosis not present

## 2018-08-23 DIAGNOSIS — D225 Melanocytic nevi of trunk: Secondary | ICD-10-CM | POA: Diagnosis not present

## 2018-08-23 DIAGNOSIS — L821 Other seborrheic keratosis: Secondary | ICD-10-CM | POA: Diagnosis not present

## 2018-08-23 DIAGNOSIS — C441192 Basal cell carcinoma of skin of left lower eyelid, including canthus: Secondary | ICD-10-CM | POA: Diagnosis not present

## 2018-08-28 DIAGNOSIS — B351 Tinea unguium: Secondary | ICD-10-CM | POA: Diagnosis not present

## 2018-08-28 DIAGNOSIS — M79676 Pain in unspecified toe(s): Secondary | ICD-10-CM | POA: Diagnosis not present

## 2018-09-06 ENCOUNTER — Ambulatory Visit: Payer: Self-pay | Admitting: Licensed Clinical Social Worker

## 2018-09-06 DIAGNOSIS — Z8673 Personal history of transient ischemic attack (TIA), and cerebral infarction without residual deficits: Secondary | ICD-10-CM

## 2018-09-06 DIAGNOSIS — Z720 Tobacco use: Secondary | ICD-10-CM

## 2018-09-06 DIAGNOSIS — R531 Weakness: Secondary | ICD-10-CM

## 2018-09-06 DIAGNOSIS — Z9119 Patient's noncompliance with other medical treatment and regimen: Secondary | ICD-10-CM

## 2018-09-06 DIAGNOSIS — F339 Major depressive disorder, recurrent, unspecified: Secondary | ICD-10-CM

## 2018-09-06 DIAGNOSIS — I10 Essential (primary) hypertension: Secondary | ICD-10-CM

## 2018-09-06 DIAGNOSIS — Z91199 Patient's noncompliance with other medical treatment and regimen due to unspecified reason: Secondary | ICD-10-CM

## 2018-09-06 NOTE — Chronic Care Management (AMB) (Signed)
  Care Management Note   Walter [name] is a 68 y.o. year old male who is a primary care patient of Dettinger, Walter Kaufmann, MD. The CM team was consulted for assistance with chronic disease management and care coordination.   I reached out to [place] of client by phone today.    Review of patient status, including review of consultants reports, relevant laboratory and other test results, and collaboration with appropriate care team members and the patient's provider was performed as part of comprehensive patient evaluation and provision of chronic care management services.   Social Determinants of Health:Risk of tobacco exposure    Chronic Care Management from 05/08/2018 in [place]  PHQ-9 Total Score  9     Client had visit in May of 2020 with Dr. Warrick [name]. Dr. Warrick [name] made referral for client for podiatry. Dr. Warrick [name] also made referral for client for dermatologist. [name] reported that client had been to appointment with dermatologist. Dermatologist said client would need to see a surgeon to remove skin cancer on his eyelid. Client had appointment with podiatrist., Dr. Irving [name] in past 2 weeks. Client has medications and is taking medications as prescribed.  Client is sleeping well.Client is eating adequately. Client is attending scheduled medical appointments. Client is not speaking of any pain issues.  Client is walking with use of a cane.  LCSW encouraged client or spouse to talk with RNCM Walter [name] regarding nursing needs of client   Follow Up Plan: LCSW to call client or spouse in next 3 weeks to talk with client or spouse about depression symptoms management of client.   Walter [name].Walter [name] MSW, LCSW Licensed Clinical Social Worker [facility] [phone]

## 2018-09-06 NOTE — Patient Instructions (Signed)
Licensed Clinical Social Worker Visit Information  Materials Provided: No  Client had visit in May of 2020 with Dr. Warrick Parisian. Dr. Warrick Parisian made referral for client for podiatry. Dr. Warrick Parisian also made referral for client for dermatologist. [name] reported that client had been to appointment with dermatologist. Dermatologist said client would need to see a surgeon to remove skin cancer on his eyelid. Client had appointment with podiatrist., Dr. Irving Shows in past 2 weeks. Client has medications and is taking medications as prescribed.  Client is sleeping well.Client is eating adequately. Client is attending scheduled medical appointments. Client is not speaking of any pain issues.  Client is walking with use of a cane.  LCSW encouraged client or spouse to talk with RNCM [name] regarding nursing needs of client   Follow Up Plan: LCSW to call client or spouse in next 3 weeks to talk with client or spouse about depression symptoms management of client.   The patient /spouse of patient verbalized understanding of instructions provided today and declined a print copy of patient instruction materials.   Walter [name].Walter [name] MSW, LCSW Licensed Clinical Social Worker [facility] [phone]

## 2018-09-20 DIAGNOSIS — D04111 Carcinoma in situ of skin of right upper eyelid, including canthus: Secondary | ICD-10-CM | POA: Diagnosis not present

## 2018-09-20 DIAGNOSIS — Z08 Encounter for follow-up examination after completed treatment for malignant neoplasm: Secondary | ICD-10-CM | POA: Diagnosis not present

## 2018-09-20 DIAGNOSIS — D0439 Carcinoma in situ of skin of other parts of face: Secondary | ICD-10-CM | POA: Diagnosis not present

## 2018-09-20 DIAGNOSIS — Z85828 Personal history of other malignant neoplasm of skin: Secondary | ICD-10-CM | POA: Diagnosis not present

## 2018-09-27 ENCOUNTER — Ambulatory Visit (INDEPENDENT_AMBULATORY_CARE_PROVIDER_SITE_OTHER): Payer: Medicare HMO | Admitting: Licensed Clinical Social Worker

## 2018-09-27 DIAGNOSIS — I10 Essential (primary) hypertension: Secondary | ICD-10-CM | POA: Diagnosis not present

## 2018-09-27 DIAGNOSIS — R531 Weakness: Secondary | ICD-10-CM

## 2018-09-27 DIAGNOSIS — Z9119 Patient's noncompliance with other medical treatment and regimen: Secondary | ICD-10-CM

## 2018-09-27 DIAGNOSIS — Z720 Tobacco use: Secondary | ICD-10-CM

## 2018-09-27 DIAGNOSIS — Z91199 Patient's noncompliance with other medical treatment and regimen due to unspecified reason: Secondary | ICD-10-CM

## 2018-09-27 DIAGNOSIS — F339 Major depressive disorder, recurrent, unspecified: Secondary | ICD-10-CM

## 2018-09-27 DIAGNOSIS — Z8673 Personal history of transient ischemic attack (TIA), and cerebral infarction without residual deficits: Secondary | ICD-10-CM

## 2018-09-27 NOTE — Chronic Care Management (AMB) (Signed)
Chronic Care Management    Clinical Social Work CCM Outreach Note  09/27/2018 Name: Walter [name] MRN: [id number] DOB: [birth year]  Walter [name] is a 68 y.o. year old male who is a primary care patient of Dettinger, Fransisca Kaufmann, MD . The CCM team was consulted for assistance with assessment of psychosocial needs.   LCSW reached out to [place] of client today by phone    Social Determinants of Health:Risk of tobacco exposure; risk of social isolation    Chronic Care Management from 05/08/2018 in [place]  PHQ-9 Total Score  9     Goals    . "Client said he wanted to talk to someone about depression and managing depression" (pt-stated)     Current Barriers:  . Difficulty walking (uses cane to walk) . Medication costs can be difficult . Fatigues easily . Hygiene challenges . Smokes daily  Clinical Social Work Clinical Goal(s):  [name] Over the next 30 days, client will work with LCSW to address concerns related to depression and mangement of depression  symptoms of client  Interventions: . LCSW talked with client/spouse of client about managing depression symptoms of client  . Encouraged client or spouse of client to talk with RN CM regarding nursing needs of client . Encouraged that client socialize with family or friends as a way to help manage depression symptoms   .   Encouraged client to use relaxation techniques to help manage depression symptoms (watch TV, walking outside, listen to music,visit with sister)   .  Talked with client about client's completion of ADLs    .  Talked with spouse of client about smoking of client   Patient Self Care Activities:  . Attends scheduled provider appointments . Takes medications as prescribed . Drives self to appointments  Plan:  LCSW to communicate with client/spouse in next 3 weeks to discuss management of depression symptoms of client  Client/spouse to call LCSW as needed to discuss depression issues  of client  Client/spouse to tak with RN CM [name] to discuss nursing needs of client  Client to use relaxation techniques to help manage depression symptoms. (listen to music, walk outdoors, watch TV)  Talked with client about client completion of ADLs.  Initial goal documentation      Client had visit in May of 2020 with Dr. Warrick Parisian. Dr. Warrick Parisian made referral for client for podiatry. Dr. Warrick Parisian also made referral for client for dermatologist. [name] reported that client had been to appointment with dermatologist. Dermatologist said client would need to see a surgeon to remove skin cancer on his eyelid. Client also had appointment with podiatrist., Dr. Irving Shows. Client has medications and is taking medications as prescribed.  Client is sleeping well.Client is eating adequately. Client is attending scheduled medical appointments. Client is not speaking of any pain issues.  Client is walking with use of a cane.  LCSW encouraged client or spouse to talk with RNCM [name] regarding nursing needs of client . [name] reported  that client is still smoking one half pack of cigarettes daily. Client has appointment with Dr. [place] Crane on July 9,2020.  Spouse reported that referral for eye surgery had been made for client but she was told it may be September of 2020 possibly before procedure could be done.  Client has appointment scheduled with Dr. Warrick Parisian on 10/10/2018  Follow Up Plan: LCSW to communicate with client/spouse in next 3 weeks to discuss management of depression  symptoms of client  Adylene Dlugosz S.Tonye Tancredi MSW, LCSW Licensed Clinical Social Worker [facility] [phone] 

## 2018-09-27 NOTE — Patient Instructions (Addendum)
Licensed Clinical Social Worker Visit Information  Goals we discussed today:   Goals    . "Client said he wanted to talk to someone about depression and managing depression" (pt-stated)     Current Barriers:  . Difficulty walking (uses cane to walk) . Medication costs can be difficult . Fatigues easily . Hygiene challenges . Smokes daily  Clinical Social Work Clinical Goal(s):  Walter [name] Kitchen Over the next 30 days, client will work with LCSW to address concerns related to depression and mangement of depression  symptoms of client  Interventions: . LCSW talked with client/spouse of client about managing depression symptoms of client  . Encouraged client or spouse of client to talk with RN CM regarding nursing needs of client . Encouraged that client socialize with family or friends as a way to help manage depression symptoms   .   Encouraged client to use relaxation techniques to help manage depression symptoms (watch TV, walking outside, listen to music,visit with sister)   .  Talked with client about client's completion of ADLs    .  Talked with spouse of client about smoking of client   Patient Self Care Activities:  . Attends scheduled provider appointments . Takes medications as prescribed . Drives self to appointments  Plan:  LCSW to communicate with client/spouse in next 3 weeks to discuss management of depression symptoms of client  Client/spouse to call LCSW as needed to discuss depression issues of client  Client/spouse to tak with RN CM [name] to discuss nursing needs of client  Client to use relaxation techniques to help manage depression symptoms. (listen to music, walk outdoors, watch TV)  Talked with client about client completion of ADLs.  Initial goal documentation      Materials Provided: No    Client had visit in May of 2020 with Dr. Warrick Parisian. Dr. Warrick Parisian made referral for client for podiatry. Dr. Warrick Parisian also made referral for client for dermatologist.  [name] reported that client had been to appointment with dermatologist. Dermatologist said client would need to see a surgeon to remove skin cancer on his eyelid. Client also had appointment with podiatrist., Dr. Irving Shows. Client has medications and is taking medications as prescribed. Client is sleeping well.Client is eating adequately. Client is attending scheduled medical appointments. Client is not speaking of any pain issues. Client is walking with use of a cane. LCSW encouraged client or spouse to talk with RNCM [name] regarding nursing needs of client . [name] reported  that client is still smoking one half pack of cigarettes daily. Client has appointment with Dr. Nevada Crane on July 9,2020.  Spouse reported that referral for eye surgery had been made for client but she was told it may be September of 2020 possibly before procedure could be done.  Client has appointment scheduled with Dr. Warrick Parisian on 10/10/2018  Follow Up Plan: LCSW to communicate with client/spouse in next 3 weeks to discuss management of depression symptoms of client  The patient/spouse of patient verbalized understanding of instructions provided today and declined a print copy of patient instruction materials.   Walter [name].Walter [name] MSW, LCSW Licensed Clinical Social Worker [facility] [phone]

## 2018-10-09 ENCOUNTER — Other Ambulatory Visit: Payer: Self-pay

## 2018-10-10 ENCOUNTER — Ambulatory Visit (INDEPENDENT_AMBULATORY_CARE_PROVIDER_SITE_OTHER): Payer: Medicare HMO | Admitting: Family Medicine

## 2018-10-10 ENCOUNTER — Encounter: Payer: Self-pay | Admitting: Family Medicine

## 2018-10-10 VITALS — BP 138/79 | HR 72 | Temp 97.5°F | Ht 70.5 in | Wt 191.4 lb

## 2018-10-10 DIAGNOSIS — I10 Essential (primary) hypertension: Secondary | ICD-10-CM | POA: Diagnosis not present

## 2018-10-10 DIAGNOSIS — F339 Major depressive disorder, recurrent, unspecified: Secondary | ICD-10-CM | POA: Diagnosis not present

## 2018-10-10 DIAGNOSIS — E782 Mixed hyperlipidemia: Secondary | ICD-10-CM | POA: Diagnosis not present

## 2018-10-10 NOTE — Progress Notes (Signed)
BP 138/79   Pulse 72   Temp (!) 97.5 F (36.4 C) (Oral)   Ht 5' 10.5" (1.791 m)   Wt 191 lb 6.4 oz (86.8 kg)   BMI 27.07 kg/m    Subjective:   Patient ID: Walter [name], male    DOB: 02/05/1951, 68 y.o.   MRN: [id number]  HPI: Walter [name] is a 67 y.o. male presenting on 10/10/2018 for Hypertension (check up of chronic medical conditions)   HPI Hypertension Patient is currently on amlodipine, and their blood pressure today is 138/79. Patient denies any lightheadedness or dizziness. Patient denies headaches, blurred vision, chest pains, shortness of breath, or weakness. Denies any side effects from medication and is content with current medication.   Hyperlipidemia Patient is coming in for recheck of his hyperlipidemia. The patient is currently taking atorvastatin. They deny any issues with myalgias or history of liver damage from it. They deny any focal numbness or weakness or chest pain.  Patient has a history of carotid artery disease and cerebral infarction and is currently on Plavix  Depression Patient is coming in for recheck of depression.  He is currently on Wellbutrin.  He is here with his daughter who provides some of the history because of his memory issues.  They say to continue on the Wellbutrin because he is doing so well on it  Relevant past medical, surgical, family and social history reviewed and updated as indicated. Interim medical history since our last visit reviewed. Allergies and medications reviewed and updated.  Review of Systems  Constitutional: Negative for chills and fever.  Eyes: Negative for visual disturbance.  Respiratory: Negative for shortness of breath and wheezing.   Cardiovascular: Negative for chest pain and leg swelling.  Musculoskeletal: Negative for back pain and gait problem.  Skin: Negative for rash.  Neurological: Negative for dizziness, weakness and light-headedness.  Psychiatric/Behavioral: Negative for decreased concentration,  dysphoric mood, self-injury, sleep disturbance and suicidal ideas. The patient is not nervous/anxious.   All other systems reviewed and are negative.   Per HPI unless specifically indicated above   Allergies as of 10/10/2018   No Known Allergies     Medication List       Accurate as of October 10, 2018 11:32 AM. If you have any questions, ask your nurse or doctor.        amLODipine 5 MG tablet Commonly known as: NORVASC Take 1 tablet (5 mg total) by mouth daily.   atorvastatin 40 MG tablet Commonly known as: LIPITOR Take 1 tablet (40 mg total) by mouth daily.   buPROPion 300 MG 24 hr tablet Commonly known as: WELLBUTRIN XL Take 1 tablet (300 mg total) by mouth daily.   clopidogrel 75 MG tablet Commonly known as: PLAVIX Take 1 tablet (75 mg total) by mouth daily.   terbinafine 250 MG tablet Commonly known as: LAMISIL Take 1 tablet (250 mg total) by mouth daily.        Objective:   BP 138/79   Pulse 72   Temp (!) 97.5 F (36.4 C) (Oral)   Ht 5' 10.5" (1.791 m)   Wt 191 lb 6.4 oz (86.8 kg)   BMI 27.07 kg/m   Wt Readings from Last 3 Encounters:  10/10/18 191 lb 6.4 oz (86.8 kg)  08/17/18 191 lb 3.2 oz (86.7 kg)  04/06/18 190 lb (86.2 kg)    Physical Exam Vitals signs and nursing note reviewed.  Constitutional:      General: He is not  in acute distress.    Appearance: He is well-developed. He is not diaphoretic.  Eyes:     General: No scleral icterus.    Conjunctiva/sclera: Conjunctivae normal.  Neck:     Musculoskeletal: Neck supple.     Thyroid: No thyromegaly.  Cardiovascular:     Rate and Rhythm: Normal rate and regular rhythm.     Heart sounds: Normal heart sounds. No murmur.  Pulmonary:     Effort: Pulmonary effort is normal. No respiratory distress.     Breath sounds: Normal breath sounds. No wheezing.  Musculoskeletal: Normal range of motion.  Lymphadenopathy:     Cervical: No cervical adenopathy.  Skin:    General: Skin is warm and dry.      Findings: No rash.  Neurological:     Mental Status: He is alert and oriented to person, place, and time.     Coordination: Coordination normal.  Psychiatric:        Behavior: Behavior normal.    Assessment & Plan:   Problem List Items Addressed This Visit      Cardiovascular and Mediastinum   HTN (hypertension) - Primary   Relevant Orders   CMP14+EGFR (Completed)     Other   Depression, recurrent ([place])   Relevant Orders   CBC with Differential/Platelet (Completed)   Hyperlipidemia, mixed   Relevant Orders   Lipid panel (Completed)      Continue current medication, no changes, blood pressure looks good for his age, he is having some falls because of balance issues and I recommended physical therapy but they declined because of expense of co-pays right now and recommended that he do therapies and exercises at home and his wife will try and get him to do some of those. Follow up plan: Return in about 6 months (around 04/12/2019), or if symptoms worsen or fail to improve, for Hypertension and cholesterol and depression recheck.  Counseling provided for all of the vaccine components No orders of the defined types were placed in this encounter.   Caryl Pina, MD [facility] 10/10/2018, 11:32 AM

## 2018-10-11 DIAGNOSIS — D485 Neoplasm of uncertain behavior of skin: Secondary | ICD-10-CM | POA: Diagnosis not present

## 2018-10-11 DIAGNOSIS — L905 Scar conditions and fibrosis of skin: Secondary | ICD-10-CM | POA: Diagnosis not present

## 2018-10-11 LAB — CBC WITH DIFFERENTIAL/PLATELET
Basophils Absolute: 0.1 10*3/uL (ref 0.0–0.2)
Basos: 1 %
EOS (ABSOLUTE): 0.2 10*3/uL (ref 0.0–0.4)
Eos: 2 %
Hematocrit: 50.2 % (ref 37.5–51.0)
Hemoglobin: 16 g/dL (ref 13.0–17.7)
Immature Grans (Abs): 0.1 10*3/uL (ref 0.0–0.1)
Immature Granulocytes: 1 %
Lymphocytes Absolute: 2.3 10*3/uL (ref 0.7–3.1)
Lymphs: 23 %
MCH: 28.3 pg (ref 26.6–33.0)
MCHC: 31.9 g/dL (ref 31.5–35.7)
MCV: 89 fL (ref 79–97)
Monocytes Absolute: 0.9 10*3/uL (ref 0.1–0.9)
Monocytes: 9 %
Neutrophils Absolute: 6.8 10*3/uL (ref 1.4–7.0)
Neutrophils: 64 %
Platelets: 271 10*3/uL (ref 150–450)
RBC: 5.65 x10E6/uL (ref 4.14–5.80)
RDW: 13.2 % (ref 11.6–15.4)
WBC: 10.4 10*3/uL (ref 3.4–10.8)

## 2018-10-11 LAB — CMP14+EGFR
ALT: 21 IU/L (ref 0–44)
AST: 16 IU/L (ref 0–40)
Albumin/Globulin Ratio: 1.5 (ref 1.2–2.2)
Albumin: 4.3 g/dL (ref 3.8–4.8)
Alkaline Phosphatase: 124 IU/L — ABNORMAL HIGH (ref 39–117)
BUN/Creatinine Ratio: 8 — ABNORMAL LOW (ref 10–24)
BUN: 8 mg/dL (ref 8–27)
Bilirubin Total: 0.4 mg/dL (ref 0.0–1.2)
CO2: 23 mmol/L (ref 20–29)
Calcium: 9.4 mg/dL (ref 8.6–10.2)
Chloride: 105 mmol/L (ref 96–106)
Creatinine, Ser: 0.96 mg/dL (ref 0.76–1.27)
GFR calc Af Amer: 92 mL/min/{1.73_m2} (ref >59)
GFR calc non Af Amer: 80 mL/min/{1.73_m2} (ref >59)
Globulin, Total: 2.9 g/dL (ref 1.5–4.5)
Glucose: 80 mg/dL (ref 65–99)
Potassium: 4.3 mmol/L (ref 3.5–5.2)
Sodium: 141 mmol/L (ref 134–144)
Total Protein: 7.2 g/dL (ref 6.0–8.5)

## 2018-10-11 LAB — LIPID PANEL
Chol/HDL Ratio: 4.7 ratio (ref 0.0–5.0)
Cholesterol, Total: 145 mg/dL (ref 100–199)
HDL: 31 mg/dL — ABNORMAL LOW (ref >39)
LDL Calculated: 83 mg/dL (ref 0–99)
Triglycerides: 156 mg/dL — ABNORMAL HIGH (ref 0–149)
VLDL Cholesterol Cal: 31 mg/dL (ref 5–40)

## 2018-10-18 ENCOUNTER — Ambulatory Visit (INDEPENDENT_AMBULATORY_CARE_PROVIDER_SITE_OTHER): Payer: Medicare HMO | Admitting: Licensed Clinical Social Worker

## 2018-10-18 DIAGNOSIS — Z720 Tobacco use: Secondary | ICD-10-CM

## 2018-10-18 DIAGNOSIS — Z8673 Personal history of transient ischemic attack (TIA), and cerebral infarction without residual deficits: Secondary | ICD-10-CM

## 2018-10-18 DIAGNOSIS — I10 Essential (primary) hypertension: Secondary | ICD-10-CM | POA: Diagnosis not present

## 2018-10-18 DIAGNOSIS — F339 Major depressive disorder, recurrent, unspecified: Secondary | ICD-10-CM

## 2018-10-18 DIAGNOSIS — Z9119 Patient's noncompliance with other medical treatment and regimen: Secondary | ICD-10-CM

## 2018-10-18 DIAGNOSIS — Z91199 Patient's noncompliance with other medical treatment and regimen due to unspecified reason: Secondary | ICD-10-CM

## 2018-10-18 DIAGNOSIS — R531 Weakness: Secondary | ICD-10-CM

## 2018-10-18 NOTE — Chronic Care Management (AMB) (Signed)
  Care Management Note   Walter [name] is a 68 y.o. year old male who is a primary care patient of Dettinger, Walter Kaufmann, MD. The CM team was consulted for assistance with chronic disease management and care coordination.   I reached out to Walter [name] of client by phone today.   Review of patient status, including review of consultants reports, relevant laboratory and other test results, and collaboration with appropriate care team members and the patient's provider was performed as part of comprehensive patient evaluation and provision of chronic care management services.   Social Determinants of Health:risk for tobacco use; risk of social isolation    Chronic Care Management from 05/08/2018 in [name]  PHQ-9 Total Score  9     Goals    . "Client said he wanted to talk to someone about depression and managing depression" (pt-stated)     Current Barriers:  . Difficulty walking (uses cane to walk) . Medication costs can be difficult . Fatigues easily . Hygiene challenges . Smokes daily  Clinical Social Work Clinical Goal(s):  Walter [name] Kitchen Over the next 30 days, client will work with LCSW to address concerns related to depression and mangement of depression  symptoms of client  Interventions: . LCSW talked with client/spouse of client about managing depression symptoms of client  . Encouraged client or spouse of client to talk with RN CM regarding nursing needs of client .   Encouraged client to use relaxation techniques to help manage depression symptoms (watch TV, walking outside, listen to music,visit with sister)   .  Talked with client about client's completion of ADLs    .  Talked with spouse of client about smoking of client   Patient Self Care Activities:  . Attends scheduled provider appointments . Takes medications as prescribed . Drives self to appointments  Plan:  LCSW to communicate with client/spouse in next 3 weeks to discuss management of  depression symptoms of client  Client/spouse to call LCSW as needed to discuss depression issues of client  Client/spouse to talk with RN CM Walter [name] to discuss nursing needs of client  Client to use relaxation techniques to help manage depression symptoms. (listen to music, walk outdoors, watch TV)   Initial goal documentation   Client has had procedure on his  Left eye within the last 2 weeks. He has to go on 10/23/2018 to have consultation regarding right eye of client. Client is eating adequately.  Client is sleeping well. Client likes to use relaxation techniques to help him manage anxiety or stress symptoms. Client also has had a podiatry visit within the past month.  LCSW encouraged client or spouse to call RNCM to discuss nursing needs of client. Wife of client sometimes helps client with ADLS. Client uses a cane occasionally to help with ambulation.    Follow Up Plan: LCSW to communicate with client or spouse of client in next 3 weeks to discuss management of depression symptoms of client.  Walter [name].Walter [name] MSW, LCSW Licensed Clinical Social Worker [facility] [phone]

## 2018-10-18 NOTE — Patient Instructions (Signed)
Licensed Clinical Social Worker Visit Information  Goals we discussed today:   Goals    . "Client said he wanted to talk to someone about depression and managing depression" (pt-stated)     Current Barriers:  . Difficulty walking (uses cane to walk) . Medication costs can be difficult . Fatigues easily . Hygiene challenges . Smokes daily  Clinical Social Work Clinical Goal(s):  [name] Over the next 30 days, client will work with LCSW to address concerns related to depression and mangement of depression  symptoms of client  Interventions: . LCSW talked with client/spouse of client about managing depression symptoms of client  . Encouraged client or spouse of client to talk with RN CM regarding nursing needs of client .   Encouraged client to use relaxation techniques to help manage depression symptoms (watch TV, walking outside, listen to music,visit with sister)   .  Talked with client about client's completion of ADLs    .  Talked with spouse of client about smoking of client   Patient Self Care Activities:  . Attends scheduled provider appointments . Takes medications as prescribed . Drives self to appointments  Plan:  LCSW to communicate with client/spouse in next 3 weeks to discuss management of depression symptoms of client  Client/spouse to call LCSW as needed to discuss depression issues of client  Client/spouse to tak with RN CM [name] to discuss nursing needs of client  Client to use relaxation techniques to help manage depression symptoms. (listen to music, walk outdoors, watch TV)  Talked with client about client completion of ADLs.  Initial goal documentation           Materials Provided:   Follow Up Plan: LCSW to call client or spouse of client in next 3 weeks to discuss depression symptoms management for client  The patient/spouse of patient verbalized understanding of instructions provided today and declined a print copy of patient instruction  materials.   [name].Bayleigh Loflin MSW, LCSW Licensed Clinical Social Worker [facility] [phone]

## 2018-10-23 DIAGNOSIS — C441192 Basal cell carcinoma of skin of left lower eyelid, including canthus: Secondary | ICD-10-CM | POA: Diagnosis not present

## 2018-11-07 ENCOUNTER — Ambulatory Visit (INDEPENDENT_AMBULATORY_CARE_PROVIDER_SITE_OTHER): Payer: Medicare HMO | Admitting: Licensed Clinical Social Worker

## 2018-11-07 DIAGNOSIS — Z91199 Patient's noncompliance with other medical treatment and regimen due to unspecified reason: Secondary | ICD-10-CM

## 2018-11-07 DIAGNOSIS — Z8673 Personal history of transient ischemic attack (TIA), and cerebral infarction without residual deficits: Secondary | ICD-10-CM

## 2018-11-07 DIAGNOSIS — F339 Major depressive disorder, recurrent, unspecified: Secondary | ICD-10-CM

## 2018-11-07 DIAGNOSIS — Z9119 Patient's noncompliance with other medical treatment and regimen: Secondary | ICD-10-CM

## 2018-11-07 DIAGNOSIS — I10 Essential (primary) hypertension: Secondary | ICD-10-CM | POA: Diagnosis not present

## 2018-11-07 DIAGNOSIS — Z720 Tobacco use: Secondary | ICD-10-CM

## 2018-11-07 DIAGNOSIS — R531 Weakness: Secondary | ICD-10-CM

## 2018-11-07 NOTE — Patient Instructions (Signed)
Licensed Clinical Social Worker Visit Information  Goals we discussed today:  Goals Addressed            This Visit's Progress   . "Client said he wanted to talk to someone about depression and managing depression" (pt-stated)       Current Barriers:  . Difficulty walking (uses cane to walk) . Medication costs can be difficult . Fatigues easily . Hygiene challenges . Smokes daily  Clinical Social Work Clinical Goal(s):  [name] Over the next 30 days, client will work with LCSW to address concerns related to depression and mangement of depression  symptoms of client  Interventions: . LCSW talked with client/spouse of client about managing depression symptoms of client  . Encouraged client or spouse of client to talk with RN CM regarding nursing needs of client . Encouraged that client socialize with family or friends as a way to help manage depression symptoms   .   Encouraged client to use relaxation techniques to help manage depression symptoms (watch TV, walking outside, listen to music,visit with sister)   .  Talked with client/spouse about client's completion of ADLs      Patient Self Care Activities:  . Attends scheduled provider appointments . Takes medications as prescribed . Drives self to appointments  Plan:  LCSW to communicate with client/spouse in next 3 weeks to discuss management of depression symptoms of client  Client/spouse to call LCSW as needed to discuss depression issues of client  Client/spouse to talk with RN CM [name] to discuss nursing needs of client  Client to use relaxation techniques to help manage depression symptoms. (listen to music, walk outdoors, watch TV)  Initial goal documentation        Materials Provided: No  Follow Up Plan: LCSW to communicate with client/spouse in next 3 weeks to discuss managment of depression symptoms of client  The patient /spouse Walter [name] verbalized understanding of instructions provided today and  declined a print copy of patient instruction materials.   [name].Kitara Hebb MSW, LCSW Licensed Clinical Social Worker [facility] [phone]

## 2018-11-07 NOTE — Chronic Care Management (AMB) (Signed)
  Care Management Note   Walter [name] is a 68 y.o. year old male who is a primary care patient of Walter [name], Walter Kaufmann, MD. The CM team was consulted for assistance with chronic disease management and care coordination.   I reached out to Walter [name] of client by phone today.   Review of patient status, including review of consultants reports, relevant laboratory and other test results, and collaboration with appropriate care team members and the patient's provider was performed as part of comprehensive patient evaluation and provision of chronic care management services.   Social Determinants of Health:risk of social isolation; risk of tobacco use    Chronic Care Management from 05/08/2018 in [place]  PHQ-9 Total Score  9     Goals Addressed            This Visit's Progress   . "Client said he wanted to talk to someone about depression and managing depression" (pt-stated)       Current Barriers:  . Difficulty walking (uses cane to walk) . Medication costs can be difficult . Fatigues easily . Hygiene challenges . Smokes daily  Clinical Social Work Clinical Goal(s):  Walter [name] Kitchen Over the next 30 days, client will work with Walter [name] to address concerns related to depression and mangement of depression  symptoms of client  Interventions: . Walter [name] talked with client/spouse of client about managing depression symptoms of client  . Encouraged client or spouse of client to talk with RN CM regarding nursing needs of client . Encouraged that client socialize with family or friends as a way to help manage depression symptoms   .   Encouraged client to use relaxation techniques to help manage depression symptoms (watch TV, walking outside, listen to music,visit with sister)   .  Talked with client/spouse about client's completion of ADLs     Patient Self Care Activities:  . Attends scheduled provider appointments . Takes medications as prescribed . Drives self to  appointments  Plan:  Walter [name] to communicate with client/spouse in next 3 weeks to discuss management of depression symptoms of client  Client/spouse to call Walter [name] as needed to discuss depression issues of client  Client/spouse to talk with RN CM Walter [name] to discuss nursing needs of client  Client to use relaxation techniques to help manage depression symptoms. (listen to music, walk outdoors, watch TV)  Initial goal documentation    Walter [name] spoke via phone with Walter [name] today regarding client needs. Walter [name] said client has appointment in September 2020 for evaluation and treatment of left eye.  Client is sleeping well.  Client has decreased appetite. Walter [name] and Walter [name] spoke of client relaxation activites.Client has prescribed medications and is taking medications as prescribed. Client has not mentioned any recent pain issues. Walter [name], spouse of client , assists client with daily ADLs.  Client uses cane to assist with ambulation. Client enjoys socializing with his sister.  Walter [name] encouraged client or spouse of client to call Walter [name] as needed to discuss social work needs of client  Follow Up Plan: Walter [name] to communicate with client/spouse in next 3 weeks to discuss management of depression symptoms of client  Walter [name].Walter [name] MSW, Walter [name] Licensed Clinical Social Worker [name] [phone]

## 2018-11-15 DIAGNOSIS — C44319 Basal cell carcinoma of skin of other parts of face: Secondary | ICD-10-CM | POA: Diagnosis not present

## 2018-11-28 ENCOUNTER — Ambulatory Visit: Payer: Self-pay | Admitting: Licensed Clinical Social Worker

## 2018-11-28 DIAGNOSIS — Z91199 Patient's noncompliance with other medical treatment and regimen due to unspecified reason: Secondary | ICD-10-CM

## 2018-11-28 DIAGNOSIS — Z720 Tobacco use: Secondary | ICD-10-CM

## 2018-11-28 DIAGNOSIS — Z9119 Patient's noncompliance with other medical treatment and regimen: Secondary | ICD-10-CM

## 2018-11-28 DIAGNOSIS — F339 Major depressive disorder, recurrent, unspecified: Secondary | ICD-10-CM

## 2018-11-28 DIAGNOSIS — Z8673 Personal history of transient ischemic attack (TIA), and cerebral infarction without residual deficits: Secondary | ICD-10-CM

## 2018-11-28 DIAGNOSIS — I10 Essential (primary) hypertension: Secondary | ICD-10-CM

## 2018-11-28 DIAGNOSIS — R531 Weakness: Secondary | ICD-10-CM

## 2018-11-28 NOTE — Patient Instructions (Addendum)
Licensed Clinical Social Worker Visit Information  I reached out to [employer] Debbe Mounts by phone today. LCSW was unable to speak via phone today with client or spouse of client. Voice mail was not set up on phone and thus LCSW was not able to leave phone message for client or spouse  Materials Provided: No  Follow Up Plan: LCSW to call client/spouse of client in next 3 weeks to talk with client or spouse about the management of depression symptoms of client  LCSW was not able to speak via phone with client or spouse of client today. Thus ,the patient or spouse of patient was not able to verbalize understanding of instructions provided today and patient or spouse was not able to accept or decline a print copy of patient instruction materials.   [name].Kainan Patty MSW, LCSW Licensed Clinical Social Worker [facility] [phone]

## 2018-11-28 NOTE — Chronic Care Management (AMB) (Signed)
  Care Management Note   Walter [name] is a 68 y.o. year old male who is a primary care patient of Walter [name], Walter Kaufmann, MD. The CM team was consulted for assistance with chronic disease management and care coordination.   I reached out to Walter [name] by phone today. LCSW was unable to speak via phone today with client or spouse of client. Voice mail was not set up on phone and thus LCSW was not able to leave phone message for client or spouse  Review of patient status, including review of consultants reports, relevant laboratory and other test results, and collaboration with appropriate care team members and the patient's provider was performed as part of comprehensive patient evaluation and provision of chronic care management services.   Social Determinants of Health: risk of social isolation; risk of tobacco use; risk of physical inactivity    Chronic Care Management from 05/08/2018 in [place]  PHQ-9 Total Score  9      Medications   New medications from outside sources are available for reconciliation   amLODipine (NORVASC) 5 MG tablet    atorvastatin (LIPITOR) 40 MG tablet    buPROPion (WELLBUTRIN XL) 300 MG 24 hr tablet    clopidogrel (PLAVIX) 75 MG tablet    terbinafine (LAMISIL) 250 MG tablet      Follow Up Plan: LCSW to communicate with client/spouse in next 3 weeks to discuss management of depression symptoms of client  Walter [name] MSW, LCSW Licensed Clinical Social Worker [facility] [phone]

## 2018-12-20 ENCOUNTER — Ambulatory Visit (INDEPENDENT_AMBULATORY_CARE_PROVIDER_SITE_OTHER): Payer: Medicare HMO | Admitting: Licensed Clinical Social Worker

## 2018-12-20 DIAGNOSIS — Z9119 Patient's noncompliance with other medical treatment and regimen: Secondary | ICD-10-CM

## 2018-12-20 DIAGNOSIS — I10 Essential (primary) hypertension: Secondary | ICD-10-CM | POA: Diagnosis not present

## 2018-12-20 DIAGNOSIS — Z720 Tobacco use: Secondary | ICD-10-CM

## 2018-12-20 DIAGNOSIS — F339 Major depressive disorder, recurrent, unspecified: Secondary | ICD-10-CM | POA: Diagnosis not present

## 2018-12-20 DIAGNOSIS — R531 Weakness: Secondary | ICD-10-CM

## 2018-12-20 DIAGNOSIS — Z8673 Personal history of transient ischemic attack (TIA), and cerebral infarction without residual deficits: Secondary | ICD-10-CM

## 2018-12-20 DIAGNOSIS — Z91199 Patient's noncompliance with other medical treatment and regimen due to unspecified reason: Secondary | ICD-10-CM

## 2018-12-20 NOTE — Patient Instructions (Signed)
Licensed Clinical Social Worker Visit Information  Goals we discussed today:  Goals    . "Client said he wanted to talk to someone about depression and managing depression" (pt-stated)     Current Barriers:  . Difficulty walking (uses cane to walk) . Medication costs can be difficult . Fatigues easily . Hygiene challenges . Smokes daily  Clinical Social Work Clinical Goal(s):  [name] Over the next 30 days, client will work with LCSW to address concerns related to depression and mangement of depression  symptoms of client  Interventions: . LCSW talked with client/spouse of client about managing depression symptoms of client  . Encouraged client or spouse of client to talk with RN CM regarding nursing needs of client . Encouraged that client socialize with family or friends as a way to help manage depression symptoms   .   Encouraged client to use relaxation techniques to help manage depression symptoms (watch TV, walking outside, listen to music,visit with sister)   .  Talked with client/spouse about client's completion of ADLs    .  Talked with spouse of client about smoking of client (spouse of client reported that client smokes about one pack of cigarettes daily)  Patient Self Care Activities:  . Attends scheduled provider appointments . Takes medications as prescribed . Drives self to appointments  Plan:  LCSW to communicate with client/spouse in next 3 weeks to discuss management of depression symptoms of client  Client/spouse to call LCSW as needed to discuss depression issues of client  Client/spouse to talk with RN CM [name] to discuss nursing needs of client  Client to use relaxation techniques to help manage depression symptoms. (listen to music, walk outdoors, watch TV)  Talked with client about client completion of ADLs.  Initial goal documentation     Materials Provided: No   Follow Up Plan: LCSW to communicate with client/spouse in next 3 weeks to  discuss management of depression symptoms of client  The patient Walter [name], Walter [name], verbalized understanding of instructions provided today and declined a print copy of patient instruction materials.   [name].Capers Hagmann MSW, LCSW Licensed Clinical Social Worker [facility] [phone]

## 2018-12-20 NOTE — Chronic Care Management (AMB) (Signed)
Care Management Note   Walter [name] is a 68 y.o. year old male who is a primary care patient of Dettinger, Fransisca Kaufmann, MD. The CM team was consulted for assistance with chronic disease management and care coordination.   I reached out to [place] by phone today.   Review of patient status, including review of consultants reports, relevant laboratory and other test results, and collaboration with appropriate care team members and the patient's provider was performed as part of comprehensive patient evaluation and provision of chronic care management services.   Social Determinants of Health: risk of tobacco use; risk of social isolation; risk of physical inactivity    Chronic Care Management from 05/08/2018 in [place]  PHQ-9 Total Score  9     Medications   New medications from outside sources are available for reconciliation   amLODipine (NORVASC) 5 MG tablet    atorvastatin (LIPITOR) 40 MG tablet    buPROPion (WELLBUTRIN XL) 300 MG 24 hr tablet    clopidogrel (PLAVIX) 75 MG tablet    terbinafine (LAMISIL) 250 MG tablet     Goals     "Client said he wanted to talk to someone about depression and managing depression" (pt-stated)     Current Barriers:   Difficulty walking (uses cane to walk)  Medication costs can be difficult  Fatigues easily  Hygiene challenges  Smokes daily  Clinical Social Work Clinical Goal(s):   Over the next 30 days, client will work with LCSW to address concerns related to depression and mangement of depression  symptoms of client  Interventions:  LCSW talked with client/spouse of client about managing depression symptoms of client   Encouraged client or spouse of client to talk with RN CM regarding nursing needs of client  Encouraged that client socialize with family or friends as a way to help manage depression symptoms      Encouraged client to use relaxation techniques to help manage  depression symptoms (watch TV, walking outside, listen to music,visit with sister)     Talked with client/spouse about client's completion of ADLs      Talked with spouse of client about smoking of client (spouse reported that client smokes about one pack of cigarettes daily)  Patient Self Care Activities:   Attends scheduled provider appointments  Takes medications as prescribed  Drives self to appointments  Plan:  LCSW to communicate with client/spouse in next 3 weeks to discuss management of depression symptoms of client  Client/spouse to call LCSW as needed to discuss depression issues of client  Client/spouse to talk with RN CM [name] to discuss nursing needs of client  Client to use relaxation techniques to help manage depression symptoms. (listen to music, walk outdoors, watch TV)  Talked with client about client completion of ADLs.  Initial goal documentation    LCSW spoke via phone with Debbe Mounts of client, today regarding client needs. Walter [name] said client has appointment in October 2020 for evaluation and treatment of left eye.  Client is sleeping well.  Client has decreased appetite. LCSW and Walter [name] spoke of client relaxation activites.Client has prescribed medications and is taking medications as prescribed. Client has not mentioned any recent pain issues. Walter [name], spouse of client , assists client with daily ADLs.  Client uses cane to assist with ambulation. Client also has a tub seat he uses when talking a shower.Client enjoys socializing with his sister.  LCSW encouraged client or spouse of client to call  LCSW as needed to discuss social work needs of client   Follow Up Plan: LCSW to call client or spouse of client in next 3 weeks to discuss management of depression symptoms of client  [name].Norell Brisbin MSW, LCSW Licensed Clinical Social Worker [facility] [phone]

## 2018-12-27 ENCOUNTER — Other Ambulatory Visit: Payer: Self-pay

## 2018-12-27 ENCOUNTER — Ambulatory Visit (INDEPENDENT_AMBULATORY_CARE_PROVIDER_SITE_OTHER): Payer: Medicare HMO | Admitting: Family Medicine

## 2018-12-27 ENCOUNTER — Encounter: Payer: Self-pay | Admitting: Family Medicine

## 2018-12-27 DIAGNOSIS — R3911 Hesitancy of micturition: Secondary | ICD-10-CM

## 2018-12-27 DIAGNOSIS — R531 Weakness: Secondary | ICD-10-CM | POA: Diagnosis not present

## 2018-12-27 DIAGNOSIS — R350 Frequency of micturition: Secondary | ICD-10-CM

## 2018-12-27 NOTE — Progress Notes (Signed)
Virtual Visit via telephone Note  I connected with Walter [name] on 12/27/18 at 1512 by telephone and verified that I am speaking with the correct person using two identifiers. Walter [name] is currently located at home and wife are currently with her during visit. The provider, Fransisca Kaufmann Christionna Poland, MD is located in their office at time of visit.  Call ended at 1525  I discussed the limitations, risks, security and privacy concerns of performing an evaluation and management service by telephone and the availability of in person appointments. I also discussed with the patient that there may be a patient responsible charge related to this service. The patient expressed understanding and agreed to proceed.   History and Present Illness: Patient's wife says that he is getting weaker and more wobbly and worsening speech and he has to use his walker now.  He is having trouble starting urination and emptying.  He has not complained of burning or pain. He is not drinking anything but mountain dews.  He is urinating about 2-3 today. He denies blood in his urine or nausea or vomiting or abdominal pain.  He denies fevers or chills. He has been crying more.   No diagnosis found.  Outpatient Encounter Medications as of 12/27/2018  Medication Sig  . amLODipine (NORVASC) 5 MG tablet Take 1 tablet (5 mg total) by mouth daily.  [name] atorvastatin (LIPITOR) 40 MG tablet Take 1 tablet (40 mg total) by mouth daily.  [name] buPROPion (WELLBUTRIN XL) 300 MG 24 hr tablet Take 1 tablet (300 mg total) by mouth daily.  . clopidogrel (PLAVIX) 75 MG tablet Take 1 tablet (75 mg total) by mouth daily.  [name] terbinafine (LAMISIL) 250 MG tablet Take 1 tablet (250 mg total) by mouth daily.   No facility-administered encounter medications on file as of 12/27/2018.     Review of Systems  Constitutional: Positive for fatigue. Negative for chills and fever.  Respiratory: Negative for shortness of breath and wheezing.   Cardiovascular:  Negative for chest pain and leg swelling.  Genitourinary: Positive for decreased urine volume, difficulty urinating, frequency and urgency. Negative for dysuria and hematuria.  Musculoskeletal: Negative for back pain and gait problem.  Skin: Negative for rash.  Neurological: Positive for weakness. Negative for dizziness and light-headedness.  All other systems reviewed and are negative.   Observations/Objective: Patient sounds comfortable and in no acute distress although most of the conversation with his wife  Assessment and Plan: Problem List Items Addressed This Visit    None    Visit Diagnoses    Urinary hesitancy    -  Primary   Relevant Orders   Urine Culture   Urinalysis, Complete   Urinary frequency       Relevant Orders   Urine Culture   Urinalysis, Complete   Generalized weakness           Follow Up Instructions: Follow-up as needed, they are going to bring a urine by and will get a test the urine and see if he has any infection, if there is no signs of infection then we may consider doing physical therapy and Flomax to help with strength and weakness and urinary issues    I discussed the assessment and treatment plan with the patient. The patient was provided an opportunity to ask questions and all were answered. The patient agreed with the plan and demonstrated an understanding of the instructions.   The patient was advised to call back or seek an in-person  evaluation if the symptoms worsen or if the condition fails to improve as anticipated.  The above assessment and management plan was discussed with the patient. The patient verbalized understanding of and has agreed to the management plan. Patient is aware to call the clinic if symptoms persist or worsen. Patient is aware when to return to the clinic for a follow-up visit. Patient educated on when it is appropriate to go to the emergency department.    I provided 13 minutes of non-face-to-face time during this  encounter.    Worthy Rancher, MD

## 2018-12-28 ENCOUNTER — Other Ambulatory Visit: Payer: Self-pay

## 2018-12-28 ENCOUNTER — Other Ambulatory Visit: Payer: Medicare HMO

## 2018-12-28 DIAGNOSIS — R3911 Hesitancy of micturition: Secondary | ICD-10-CM | POA: Diagnosis not present

## 2018-12-28 DIAGNOSIS — R350 Frequency of micturition: Secondary | ICD-10-CM | POA: Diagnosis not present

## 2018-12-28 LAB — MICROSCOPIC EXAMINATION
RBC, Urine: NONE SEEN /hpf (ref 0–2)
Renal Epithel, UA: NONE SEEN /hpf

## 2018-12-28 LAB — URINALYSIS, COMPLETE
Bilirubin, UA: NEGATIVE
Glucose, UA: NEGATIVE
Ketones, UA: NEGATIVE
Leukocytes,UA: NEGATIVE
Nitrite, UA: NEGATIVE
Protein,UA: NEGATIVE
RBC, UA: NEGATIVE
Specific Gravity, UA: 1.025 (ref 1.005–1.030)
Urobilinogen, Ur: 0.2 mg/dL (ref 0.2–1.0)
pH, UA: 7 (ref 5.0–7.5)

## 2018-12-29 LAB — URINE CULTURE

## 2019-01-07 ENCOUNTER — Telehealth: Payer: Self-pay | Admitting: Family Medicine

## 2019-01-07 NOTE — Telephone Encounter (Signed)
Pt aware.

## 2019-01-11 ENCOUNTER — Ambulatory Visit (INDEPENDENT_AMBULATORY_CARE_PROVIDER_SITE_OTHER): Payer: Medicare HMO | Admitting: Licensed Clinical Social Worker

## 2019-01-11 DIAGNOSIS — I10 Essential (primary) hypertension: Secondary | ICD-10-CM | POA: Diagnosis not present

## 2019-01-11 DIAGNOSIS — Z8673 Personal history of transient ischemic attack (TIA), and cerebral infarction without residual deficits: Secondary | ICD-10-CM

## 2019-01-11 DIAGNOSIS — F339 Major depressive disorder, recurrent, unspecified: Secondary | ICD-10-CM | POA: Diagnosis not present

## 2019-01-11 DIAGNOSIS — R531 Weakness: Secondary | ICD-10-CM

## 2019-01-11 DIAGNOSIS — Z720 Tobacco use: Secondary | ICD-10-CM

## 2019-01-11 DIAGNOSIS — Z91199 Patient's noncompliance with other medical treatment and regimen due to unspecified reason: Secondary | ICD-10-CM

## 2019-01-11 DIAGNOSIS — J449 Chronic obstructive pulmonary disease, unspecified: Secondary | ICD-10-CM | POA: Diagnosis not present

## 2019-01-11 DIAGNOSIS — Z9119 Patient's noncompliance with other medical treatment and regimen: Secondary | ICD-10-CM

## 2019-01-11 NOTE — Patient Instructions (Signed)
Licensed Clinical Social Worker Visit Information  Goals we discussed today:  Goals    . "Client said he wanted to talk to someone about depression and managing depression" (pt-stated)     Current Barriers:  . Difficulty walking (uses cane to walk) . Medication costs can be difficult . Fatigues easily . Hygiene challenges . Smokes daily  Clinical Social Work Clinical Goal(s):  [name] Over the next 30 days, client will work with LCSW to address concerns related to depression and mangement of depression  symptoms of client  Interventions: . LCSW talked with client/spouse of client about managing depression symptoms of client  . Encouraged client or spouse of client to talk with RN CM regarding nursing needs of client . Encouraged that client socialize with family or friends as a way to help manage depression symptoms   .   Encouraged client to use relaxation techniques to help manage depression symptoms (watch TV, walking outside, listen to music,visit with sister)   .  Talked with client about client's completion of ADLs    .  Talked with spouse of client about client's ambulation needs (he uses a walker to help him ambulate) . Talked with spouse of client about client's eye procedure scheduled for next Monday in [facility]   Patient Self Care Activities:  . Attends scheduled provider appointments . Takes medications as prescribed . Drives self to appointments  Plan:  LCSW to communicate with client/spouse in next 3 weeks to discuss management of depression symptoms of client  Client/spouse to call LCSW as needed to discuss depression issues of client  Client/spouse to talk with RN CM [name] to discuss nursing needs of client  Client to use relaxation techniques to help manage depression symptoms. (listen to music, walk outdoors, watch TV)  Talked with client about client completion of ADLs.  Initial goal documentation          Materials Provided: No  Follow Up  Plan: LCSW to communicate with client/spouse in next 3 weeks to discuss management of depression symptoms of client  The patient/Linder Glomb, spouse, verbalized understanding of instructions provided today and declined a print copy of patient instruction materials.   [name].Martiza Speth MSW, LCSW Licensed Clinical Social Worker [facility] [phone]

## 2019-01-11 NOTE — Chronic Care Management (AMB) (Signed)
  Care Management Note   Walter [name] is a 68 y.o. year old male who is a primary care patient of Dettinger, Fransisca Kaufmann, MD. The CM team was consulted for assistance with chronic disease management and care coordination.   I reached out to Walter [name], spouse of client by phone today.   Review of patient status, including review of consultants reports, relevant laboratory and other test results, and collaboration with appropriate care team members and the patient's provider was performed as part of comprehensive patient evaluation and provision of chronic care management services.   Social Determinants of Health: risk of tobacco use; risk of social isolation; risk of physical inactivity    Chronic Care Management from 05/08/2018 in [place]  PHQ-9 Total Score  9     Medications   New medications from outside sources are available for reconciliation   amLODipine (NORVASC) 5 MG tablet    atorvastatin (LIPITOR) 40 MG tablet    buPROPion (WELLBUTRIN XL) 300 MG 24 hr tablet    clopidogrel (PLAVIX) 75 MG tablet    terbinafine (LAMISIL) 250 MG tablet    Goals    . "Client said he wanted to talk to someone about depression and managing depression" (pt-stated)     Current Barriers:  . Difficulty walking (uses cane to walk) . Medication costs can be difficult . Fatigues easily . Hygiene challenges . Smokes daily  Clinical Social Work Clinical Goal(s):  [name] Over the next 30 days, client will work with LCSW to address concerns related to depression and mangement of depression  symptoms of client  Interventions: . Encouraged client or spouse of client to talk with RN CM regarding nursing needs of client . Encouraged that client socialize with family or friends as a way to help manage depression symptoms   .   Encouraged client to use relaxation techniques to help manage depression symptoms (watch TV, walking outside, listen to music,visit with sister)   .   Talked with client about client's completion of ADLs    .  Talked with spouse of client about client's ambulation challenges (he uses a walker to ambulate)  . Talked with spouse of client about client's eye procedure next Monday in [place], [place]  Patient Self Care Activities:  . Attends scheduled provider appointments . Takes medications as prescribed . Drives self to appointments  Plan:  LCSW to communicate with client/spouse in next 3 weeks to discuss management of depression symptoms of client  Client/spouse to call LCSW as needed to discuss depression issues of client  Client/spouse to talk with RN CM [name] to discuss nursing needs of client  Client to use relaxation techniques to help manage depression symptoms. (listen to music, walk outdoors, watch TV)  Talked with client about client completion of ADLs.  Initial goal documentation       Client is scheduled for surgery on his left eye next Monday in [place], [place].   Follow Up Plan: LCSW to communicate with client/spouse in next 3 weeks to talk about management of depression symptoms of client  [name].Teyona Nichelson MSW, LCSW Licensed Clinical Social Worker [facility] [phone]

## 2019-01-14 DIAGNOSIS — H04542 Stenosis of left lacrimal canaliculi: Secondary | ICD-10-CM | POA: Diagnosis not present

## 2019-01-14 DIAGNOSIS — Z483 Aftercare following surgery for neoplasm: Secondary | ICD-10-CM | POA: Diagnosis not present

## 2019-01-14 DIAGNOSIS — C441192 Basal cell carcinoma of skin of left lower eyelid, including canthus: Secondary | ICD-10-CM | POA: Diagnosis not present

## 2019-01-14 DIAGNOSIS — Z48810 Encounter for surgical aftercare following surgery on the sense organs: Secondary | ICD-10-CM | POA: Diagnosis not present

## 2019-01-14 DIAGNOSIS — Z481 Encounter for planned postprocedural wound closure: Secondary | ICD-10-CM | POA: Diagnosis not present

## 2019-02-01 ENCOUNTER — Ambulatory Visit: Payer: Self-pay | Admitting: Licensed Clinical Social Worker

## 2019-02-01 DIAGNOSIS — R531 Weakness: Secondary | ICD-10-CM

## 2019-02-01 DIAGNOSIS — Z9119 Patient's noncompliance with other medical treatment and regimen: Secondary | ICD-10-CM

## 2019-02-01 DIAGNOSIS — J449 Chronic obstructive pulmonary disease, unspecified: Secondary | ICD-10-CM

## 2019-02-01 DIAGNOSIS — I10 Essential (primary) hypertension: Secondary | ICD-10-CM

## 2019-02-01 DIAGNOSIS — F339 Major depressive disorder, recurrent, unspecified: Secondary | ICD-10-CM

## 2019-02-01 DIAGNOSIS — Z720 Tobacco use: Secondary | ICD-10-CM

## 2019-02-01 DIAGNOSIS — Z91199 Patient's noncompliance with other medical treatment and regimen due to unspecified reason: Secondary | ICD-10-CM

## 2019-02-01 DIAGNOSIS — Z8673 Personal history of transient ischemic attack (TIA), and cerebral infarction without residual deficits: Secondary | ICD-10-CM

## 2019-02-01 NOTE — Patient Instructions (Signed)
Licensed Clinical Social Worker Visit Information  Goals we discussed today:  Goals    . "Client said he wanted to talk to someone about depression and managing depression" (pt-stated)     Current Barriers:  . Difficulty walking (uses cane to walk) . Medication costs can be difficult . Fatigues easily . Hygiene challenges . Smokes daily  Clinical Social Work Clinical Goal(s):  [name] Over the next 30 days, client will work with LCSW to address concerns related to depression and mangement of depression  symptoms of client  Interventions: . LCSW talked with client/spouse of client about managing depression symptoms of client  . Encouraged client or spouse of client to talk with RN CM regarding nursing needs of client . Previously encouraged that client socialize with family or friends as a way to help manage depression symptoms   .   Previously encouraged client to use relaxation techniques to help manage depression symptoms (watch TV, walking outside, listen to music,visit with sister)   .  Talked with Walter [name] about ambulation challenges of client Walter [name] said client is now using a cane to help him ambulate )  .  Talked with spouse of client about smoking of client Walter [name] said          client is smoking about one half pack of cigarettes daily)  Patient Self Care Activities:  . Attends scheduled provider appointments . Takes medications as prescribed . Drives self to appointments  Plan:  LCSW to communicate with client/spouse in next 3 weeks to discuss management of depression symptoms of client  Client/spouse to call LCSW as needed to discuss depression issues of client  Client/spouse to talk with RN CM Walter [name] to discuss nursing needs of client  Client to use relaxation techniques to help manage depression symptoms. (listen to music, walk outdoors, watch TV)   Initial goal documentation            Materials Provided: No  Follow Up Plan:  LCSW to  communicate with client/spouse in next 3 weeks to discuss management of depression symptoms of client  The patient/Walter [name], spouse, verbalized understanding of instructions provided today and declined a print copy of patient instruction materials.   Walter [name].Walter [name] MSW, LCSW Licensed Clinical Social Worker [facility] [phone]

## 2019-02-01 NOTE — Chronic Care Management (AMB) (Signed)
  Care Management Note   Walter [name] is a 68 y.o. year old male who is a primary care patient of Dettinger, Walter Kaufmann, MD. The CM team was consulted for assistance with chronic disease management and care coordination.   I reached out to Walter [name], spouse,  by phone today.  Review of patient status, including review of consultants reports, relevant laboratory and other test results, and collaboration with appropriate care team members and the patient's provider was performed as part of comprehensive patient evaluation and provision of chronic care management services.   Social determinants of health: risk of social isolation; risk of tobacco use; risk of physical inactivity    Chronic Care Management from 05/08/2018 in [place]  PHQ-9 Total Score  9     Medications   New medications from outside sources are available for reconciliation   amLODipine (NORVASC) 5 MG tablet    atorvastatin (LIPITOR) 40 MG tablet    buPROPion (WELLBUTRIN XL) 300 MG 24 hr tablet    clopidogrel (PLAVIX) 75 MG tablet    terbinafine (LAMISIL) 250 MG tablet     Goals    . "Client said he wanted to talk to someone about depression and managing depression" (pt-stated)     Current Barriers:  . Difficulty walking (uses cane to walk) . Medication costs can be difficult . Fatigues easily . Hygiene challenges . Smokes daily  Clinical Social Work Clinical Goal(s):  Walter [name] Kitchen Over the next 30 days, client will work with LCSW to address concerns related to depression and mangement of depression  symptoms of client  Interventions: . LCSW talked with client/spouse of client about managing depression symptoms of client  . Encouraged client or spouse of client to talk with RN CM regarding nursing needs of client . Previously encouraged that client socialize with family or friends as a way to help manage depression symptoms   .   Previously encouraged client to use relaxation  techniques to help manage depression symptoms (watch TV, walking outside, listen to music,visit with sister)    Talked with Walter [name] about ambulation challenges of client Walter [name] reported that client is using a cane to help him walk) Talked with Walter [name] about client's smoking (she said he smokes about one half pack of cigarettes daily) Talked with Walter [name] about recent client eye procedure. She said his eye procedure went well  Patient Self Care Activities:   Attends scheduled provider appointments  Takes medications as prescribed  Plan:  LCSW to communicate with client/spouse in next 3 weeks to discuss management of depression symptoms of client  Client/spouse to call LCSW as needed to discuss depression issues of client  Client/spouse to talk with RN CM Walter [name] to discuss nursing needs of client  Client to use relaxation techniques to help manage depression symptoms. (listen to music, walk outdoors, watch TV)      Follow Up Plan: LCSW to communicate with client/spouse in next 3 weeks to discuss management of depression symptoms of client  Walter [name].Walter [name] MSW, LCSW Licensed Clinical Social Worker [name] [phone]

## 2019-02-25 ENCOUNTER — Ambulatory Visit (INDEPENDENT_AMBULATORY_CARE_PROVIDER_SITE_OTHER): Payer: Medicare HMO | Admitting: Licensed Clinical Social Worker

## 2019-02-25 DIAGNOSIS — I10 Essential (primary) hypertension: Secondary | ICD-10-CM | POA: Diagnosis not present

## 2019-02-25 DIAGNOSIS — Z91199 Patient's noncompliance with other medical treatment and regimen due to unspecified reason: Secondary | ICD-10-CM

## 2019-02-25 DIAGNOSIS — J449 Chronic obstructive pulmonary disease, unspecified: Secondary | ICD-10-CM

## 2019-02-25 DIAGNOSIS — Z8673 Personal history of transient ischemic attack (TIA), and cerebral infarction without residual deficits: Secondary | ICD-10-CM

## 2019-02-25 DIAGNOSIS — Z9119 Patient's noncompliance with other medical treatment and regimen: Secondary | ICD-10-CM

## 2019-02-25 DIAGNOSIS — F339 Major depressive disorder, recurrent, unspecified: Secondary | ICD-10-CM

## 2019-02-25 DIAGNOSIS — Z720 Tobacco use: Secondary | ICD-10-CM

## 2019-02-25 DIAGNOSIS — R531 Weakness: Secondary | ICD-10-CM

## 2019-02-25 NOTE — Chronic Care Management (AMB) (Signed)
  Care Management Note   Walter [name] is a 68 y.o. year old male who is a primary care patient of Dettinger, Fransisca Kaufmann, MD. The CM team was consulted for assistance with chronic disease management and care coordination.   I reached out to Hartrandt, spouse of client, by phone today.   Review of patient status, including review of consultants reports, relevant laboratory and other test results, and collaboration with appropriate care team members and the patient's provider was performed as part of comprehensive patient evaluation and provision of chronic care management services.   Social determinants of health: risk of tobacco use; risk of physical inactivity; risk of social isolation    Chronic Care Management from 05/08/2018 in [place]  PHQ-9 Total Score  9     Medications   New medications from outside sources are available for reconciliation   amLODipine (NORVASC) 5 MG tablet    atorvastatin (LIPITOR) 40 MG tablet    buPROPion (WELLBUTRIN XL) 300 MG 24 hr tablet    clopidogrel (PLAVIX) 75 MG tablet    terbinafine (LAMISIL) 250 MG tablet    Goals    . "Client said he wanted to talk to someone about depression and managing depression" (pt-stated)     Current Barriers:  . Difficulty walking (uses cane to walk) . Medication costs can be difficult . Fatigues easily . Hygiene challenges . Smokes daily  Clinical Social Work Clinical Goal(s):  [name] Over the next 30 days, client will work with LCSW to address concerns related to depression and mangement of depression  symptoms of client  Interventions:  Previously encouraged that client socialize with family or friends as a way to help manage depression symptoms      Previously encouraged client to use relaxation techniques to help manage depression symptoms (watch TV, walking outside, listen to music,visit with sister)    Talked previously with Walter [name] about ambulation challenges of  client Walter [name] reported that client is using a cane to help him walk) Previously talked with Walter [name] about client's smoking (she said he smokes about one half pack of cigarettes daily)  Patient Self Care Activities:  . Attends scheduled provider appointments . Takes medications as prescribed . Drives self to appointments  Plan:  LCSW to communicate with client/spouse in next 3 weeks to discuss management of depression symptoms of client  Client/spouse to call LCSW as needed to discuss depression issues of client  Client/spouse to talk with RN CM Walter [name] to discuss nursing needs of client  Client to use relaxation techniques to help manage depression symptoms. (listen to music, walk outdoors, watch TV)  Talked with client about client completion of ADLs.  Initial goal documentation        Follow Up Plan: LCSW to call client or spouse of client in next 3 weeks to discuss depression symptoms of client  [name].Broderick Fonseca MSW, LCSW Licensed Clinical Social Worker [facility] [phone]

## 2019-02-25 NOTE — Patient Instructions (Addendum)
Licensed Clinical Social Worker Visit Information  Goals we discussed today:  Goals    . "Client said he wanted to talk to someone about depression and managing depression" (pt-stated)     Current Barriers:  . Difficulty walking (uses cane to walk) . Medication costs can be difficult . Fatigues easily . Hygiene challenges . Smokes daily  Clinical Social Work Clinical Goal(s):  [name] Over the next 30 days, client will work with LCSW to address concerns related to depression and mangement of depression  symptoms of client  Interventions:  Previously encouraged that client socialize with family or friends as a way to help manage depression symptoms  Previously encouraged client to use relaxation techniques to help manage depression symptoms (watch TV, walking outside, listen to music,visit with sister)  Talked previously with [name] about ambulationchallenges of client(Walter reported that client is using a cane to help him walk) Previously talked with [name] about client's smoking (she said he smokes about one half pack of cigarettes daily  Patient Self Care Activities:  . Attends scheduled provider appointments . Takes medications as prescribed . Drives self to appointments  Plan:  LCSW to communicate with client/spouse in next 3 weeks to discuss management of depression symptoms of client  Client/spouse to call LCSW as needed to discuss depression issues of client  Client/spouse to talk with RN CM [name] to discuss nursing needs of client  Client to use relaxation techniques to help manage depression symptoms. (listen to music, walk outdoors, watch TV)  Talked with client about client completion of ADLs.  Initial goal documentation      Materials Provided: No  Follow Up Plan: LCSW to call client or spouse of client in next 3 weeks to discuss management of depression symptoms of client  The patient/Walter [name], spouse, verbalized  understanding of instructions provided today and declined a print copy of patient instruction materials.   [name].Erbie Arment MSW, LCSW Licensed Clinical Social Worker [facility] [phone]

## 2019-03-15 ENCOUNTER — Telehealth: Payer: Self-pay

## 2019-03-15 DIAGNOSIS — R296 Repeated falls: Secondary | ICD-10-CM

## 2019-03-15 DIAGNOSIS — Z8673 Personal history of transient ischemic attack (TIA), and cerebral infarction without residual deficits: Secondary | ICD-10-CM

## 2019-03-15 DIAGNOSIS — R531 Weakness: Secondary | ICD-10-CM

## 2019-03-15 NOTE — Telephone Encounter (Signed)
Patient's wife called and is concerned that patient is having decreased mobility and more frequent falls.  She is concerned that he is becoming deconditioned and is requesting that we refer him for physical therapy.  Please advise.

## 2019-03-18 NOTE — Telephone Encounter (Signed)
Placed a referral for the patient

## 2019-03-18 NOTE — Telephone Encounter (Signed)
Aware of new referral.   Please be sure referral personal,  schedule with office beside [facility].

## 2019-03-20 ENCOUNTER — Ambulatory Visit (INDEPENDENT_AMBULATORY_CARE_PROVIDER_SITE_OTHER): Payer: Medicare HMO | Admitting: Licensed Clinical Social Worker

## 2019-03-20 DIAGNOSIS — F339 Major depressive disorder, recurrent, unspecified: Secondary | ICD-10-CM

## 2019-03-20 DIAGNOSIS — Z8673 Personal history of transient ischemic attack (TIA), and cerebral infarction without residual deficits: Secondary | ICD-10-CM

## 2019-03-20 DIAGNOSIS — E782 Mixed hyperlipidemia: Secondary | ICD-10-CM

## 2019-03-20 DIAGNOSIS — I10 Essential (primary) hypertension: Secondary | ICD-10-CM | POA: Diagnosis not present

## 2019-03-20 DIAGNOSIS — J449 Chronic obstructive pulmonary disease, unspecified: Secondary | ICD-10-CM

## 2019-03-20 NOTE — Chronic Care Management (AMB) (Signed)
  Care Management Note   Walter [name] is a 68 y.o. year old male who is a primary care patient of Dettinger, Fransisca Kaufmann, MD. The CM team was consulted for assistance with chronic disease management and care coordination.   I reached out to Walter [name], spouse, by phone today.     Review of patient status, including review of consultants reports, relevant laboratory and other test results, and collaboration with appropriate care team members and the patient's provider was performed as part of comprehensive patient evaluation and provision of chronic care management services.   Social Determinants of health: risk of tobacco use; risk of depression    Chronic Care Management from 05/08/2018 in [place]  PHQ-9 Total Score  9     Medications   (very important)  New medications from outside sources are available for reconciliation  amLODipine (NORVASC) 5 MG tablet atorvastatin (LIPITOR) 40 MG tablet buPROPion (WELLBUTRIN XL) 300 MG 24 hr tablet clopidogrel (PLAVIX) 75 MG tablet terbinafine (LAMISIL) 250 MG tablet  Goals Addressed            This Visit's Progress   . "Client said he wanted to talk to someone about depression and managing depression" (pt-stated)       Current Barriers:   [name] Mental Health Challenges in patient with Chronic Diagnoses of Depression, Hyperlipidemia, COPD , HTN, and Hx of CVA . Difficulty walking (uses cane to walk) . Fatigues easily . Hygiene challenges . Smokes daily  Clinical Social Work Clinical Goal(s):  [name] Over the next 30 days, client will work with LCSW to address concerns related to depression and mangement of depression  symptoms of client  Interventions: . LCSW talked with client/spouse of client about managing depression symptoms of client  . Encouraged client or spouse of client to talk with RN CM regarding nursing needs of client . Encouraged client to use relaxation techniques to help manage  depression symptoms (watch TV, walking outside, listen to music,visit with sister)   .  Talked with client about client's completion of ADLs    .  Talked with Walter [name] about ambulation challenges of client  Talked with Walter [name] about client's upcoming physical therapy appointment  Patient Self Care Activities:  . Attends scheduled provider appointments . Takes medications as prescribed . Drives self to appointments  Plan:  LCSW to communicate with client/spouse in next 4 weeks to discuss management of depression symptoms of client  Client/spouse to call LCSW as needed to discuss depression issues of client  Client/spouse to talk with RN CM Walter [name] to discuss nursing needs of client  Client to use relaxation techniques to help manage depression symptoms. (listen to music, walk outdoors, watch TV)  Talked with client about client completion of ADLs.  Initial goal documentation      Follow Up Plan: LCSW to call client or spouse of client in next 4 weeks to talk with client or spouse about managing depression issues of client  Walter [name] MSW, LCSW Licensed Clinical Social Worker [facility] [phone]

## 2019-03-20 NOTE — Patient Instructions (Addendum)
Licensed Clinical Social Worker Visit Information  Goals we discussed today:  Goals Addressed            This Visit's Progress   . "Client said he wanted to talk to someone about depression and managing depression" (pt-stated)       Current Barriers:   [name] Mental Health Challenges in patient with Chronic Diagnoses of Depression, Hyperlipidemia, COPD , HTN, and Hx of CVA . Difficulty walking (uses cane to walk) . Fatigues easily . Hygiene challenges . Smokes daily  Clinical Social Work Clinical Goal(s):  [name] Over the next 30 days, client will work with LCSW to address concerns related to depression and mangement of depression  symptoms of client  Interventions: . LCSW talked with client/spouse of client about managing depression symptoms of client  . Encouraged client or spouse of client to talk with RN CM regarding nursing needs of client . Encouraged client to use relaxation techniques to help manage depression symptoms (watch TV, walking outside, listen to music,visit with sister)   Talked with [name] about ambulation challenges of client Talked with [name] about client's upcoming physical therapy appointment  Patient Self Care Activities:  . Attends scheduled provider appointments . Takes medications as prescribed . Drives self to appointments  Plan:  LCSW to communicate with client/spouse in next 4 weeks to discuss management of depression symptoms of client  Client/spouse to call LCSW as needed to discuss depression issues of client  Client/spouse to talk with RN CM [name] to discuss nursing needs of client  Client to use relaxation techniques to help manage depression symptoms. (listen to music, walk outdoors, watch TV)  Talked with client about client completion of ADLs.  Initial goal documentation        Materials Provided: No  Follow Up Plan: LCSW to communicate with client/spouse in next 4 weeks to discuss management of depression  symptoms of client  The patient/Linder Jupin, spouse, verbalized understanding of instructions provided today and declined a print copy of patient instruction materials.   [name].Ethyle Tiedt MSW, LCSW Licensed Clinical Social Worker [facility] [phone]

## 2019-03-21 ENCOUNTER — Ambulatory Visit: Payer: Medicare HMO | Attending: Family Medicine | Admitting: Physical Therapy

## 2019-03-21 ENCOUNTER — Other Ambulatory Visit: Payer: Self-pay

## 2019-03-21 ENCOUNTER — Encounter: Payer: Self-pay | Admitting: Physical Therapy

## 2019-03-21 DIAGNOSIS — R2681 Unsteadiness on feet: Secondary | ICD-10-CM

## 2019-03-21 DIAGNOSIS — M6281 Muscle weakness (generalized): Secondary | ICD-10-CM | POA: Diagnosis not present

## 2019-03-21 DIAGNOSIS — R296 Repeated falls: Secondary | ICD-10-CM | POA: Diagnosis not present

## 2019-03-21 NOTE — Therapy (Signed)
[facility] [place], [place], [address] Phone: [phone]   Fax:  [phone]  Physical Therapy Evaluation  Patient Details  Name: Walter [name] MRN: [id number] Date of Birth: 02/03/1951 Referring Provider (PT): Caryl Pina, MD   Encounter Date: 03/21/2019  PT End of Session - 03/21/19 1316    Visit Number  1    Number of Visits  12    Date for PT Re-Evaluation  05/09/19    PT Start Time  0900    PT Stop Time  0945    PT Time Calculation (min)  45 min    Equipment Utilized During Treatment  Gait belt    Activity Tolerance  Patient tolerated treatment well;Patient limited by fatigue    Behavior During Therapy  Flat affect       Past Medical History:  Diagnosis Date  . Acute urinary retention 02/12/2014  . Brain TIA    recurrent   . C. difficile enteritis   . Carotid bruit   . COPD (chronic obstructive pulmonary disease) ([place])   . Coronary artery disease   . Myocardial infarction ([place])    incidental  . Nodule of kidney 02/14/14   solid on left  . Recurrent colitis due to Clostridium difficile 02/11/2014  . Stroke [facility]) 1997    Past Surgical History:  Procedure Laterality Date  . BACK SURGERY    . KNEE SURGERY      There were no vitals filed for this visit.   Subjective Assessment - 03/21/19 1207    Subjective  COVID-19 screening performed upon arrival.Patient arrives to physical therapy with reports of LE weakness, frequent falls, and difficulty walking secondary to a history of a CVA on 08/19/2017. Patient reported having home health therapy previously and family has noticed a decline in function. Patient is having difficulties with sit to stands transfers and with ambulation. The most recent fall was outdoors when trying to walk for exercise. Patient states he can get upon his own if he has a surface to press up on otherwise, he gets assistance from his wife or family member. Patient's goals are to  improve movement, improve strength, and improve ability home activities and ADLS.    Pertinent History  History of CVA and TIAs, fall risk, HTN, COPD, speech deficits    Limitations  House hold activities;Standing;Walking    Patient Stated Goals  improve balance, less difficulties with home activities, improve movement and strength    Currently in Pain?  No/denies         [facility] PT Assessment - 03/21/19 0001      Assessment   Medical Diagnosis  History of CVA, weakness, recurrent falls    Referring Provider (PT)  Caryl Pina, MD    Onset Date/Surgical Date  08/19/17   most recent CVA   Next MD Visit  04/12/2019    Prior Therapy  home health      Precautions   Precautions  Fall      Balance Screen   Has the patient fallen in the past 6 months  Yes    How many times?  10-15    Has the patient had a decrease in activity level because of a fear of falling?   Yes    Is the patient reluctant to leave their home because of a fear of falling?   Yes      [place]  Spouse/significant other      Prior Function   Level of Independence  Needs assistance with gait;Needs assistance with ADLs;Needs assistance with transfers;Needs assistance with homemaking      ROM / Strength   AROM / PROM / Strength  Strength      Strength   Overall Strength  Deficits    Strength Assessment Site  Hip;Knee;Ankle    Right/Left Hip  Right;Left    Right Hip Flexion  2+/5    Left Hip Flexion  2+/5    Right/Left Knee  Right;Left    Right Knee Flexion  3-/5    Right Knee Extension  3-/5    Left Knee Flexion  3-/5    Left Knee Extension  3/5    Right/Left Ankle  Right;Left    Right Ankle Dorsiflexion  3+/5    Right Ankle Inversion  3/5    Right Ankle Eversion  3+/5    Left Ankle Dorsiflexion  3+/5    Left Ankle Inversion  3+/5    Left Ankle Eversion  3+/5      Transfers   Transfers  Sit to Stand    Sit to Stand  4: Min guard     Five time sit to stand comments   Modified 5x sit to stand 47.55 seconds      Ambulation/Gait   Assistive device  Straight cane    Gait Pattern  Step-to pattern;Decreased stride length;Decreased step length - right;Decreased step length - left;Decreased dorsiflexion - right;Decreased dorsiflexion - left;Shuffle;Right flexed knee in stance;Left flexed knee in stance;Trunk flexed    Gait Comments  very slow gait speed                Objective measurements completed on examination: See above findings.              PT Education - 03/21/19 1212    Education Details  Heel raises, toe raises, marching, pillow squeeze    Person(s) Educated  Patient;Spouse    Methods  Explanation;Handout    Comprehension  Verbalized understanding;Returned demonstration          PT Long Term Goals - 03/21/19 1318      PT LONG TERM GOAL #1   Title  Patient will perform HEP with assistance from family.    Time  6    Period  Weeks    Status  New      PT LONG TERM GOAL #2   Title  Patient will demonstrate 3+/5 bilateral LE MMT to improve stability during functional tasks.    Time  6    Period  Weeks    Status  New      PT LONG TERM GOAL #3   Title  Patient will demonstrate modified 5x sit to stand in 40 seconds or less to decrease fall risk.    Time  6    Period  Weeks    Status  New             Plan - 03/21/19 1327    Clinical Impression Statement  Patient is a 68 year old male who presents to physical therapy with his wife with bilateral LE weakness, abnormal gait, and decreased balance secondary to a CVA on 08/19/2017. Patient requires contact guard to min A for sit <-> stand transfers. Patient's modified 5x sit to stand time of 47.5 seconds categorizes him as a fall risk with decreased functional strength. Patient ambulates with shuffling gait, poor bilateral foot clearance, increased  knee flexion bilaterally during stance phase and decreased gait speed. Patient provided  with HEP and was educated on importance of performing to optimize PT benefit. Patient would  benefit from skilled physical therapy to address deficits and goals.    Personal Factors and Comorbidities  Age;Time since onset of injury/illness/exacerbation;Comorbidity 2    Comorbidities  HTN, COPD, history of CVA and TIAs, fall risk    Examination-Activity Limitations  Bathing;Dressing;Stand;Hygiene/Grooming;Stairs;Locomotion Level;Transfers    Stability/Clinical Decision Making  Evolving/Moderate complexity    Clinical Decision Making  Moderate    Rehab Potential  Fair    PT Frequency  2x / week    PT Duration  6 weeks    PT Treatment/Interventions  ADLs/Self Care Home Management;Neuromuscular re-education;Balance training;Therapeutic exercise;Therapeutic activities;Functional mobility training;Stair training;Gait training;Patient/family education;Manual techniques;Passive range of motion;Manual lymph drainage    PT Next Visit Plan  Nustep, gentle strengthening of LEs, balance in sitting and standing. Gait training    PT Home Exercise Plan  see patient education section    Consulted and Agree with Plan of Care  Patient       Patient will benefit from skilled therapeutic intervention in order to improve the following deficits and impairments:  Abnormal gait, Decreased activity tolerance, Decreased balance, Decreased strength, Difficulty walking, Decreased safety awareness  Visit Diagnosis: Unsteadiness on feet - Plan: PT plan of care cert/re-cert  Muscle weakness (generalized) - Plan: PT plan of care cert/re-cert  Repeated falls - Plan: PT plan of care cert/re-cert     Problem List Patient Active Problem List   Diagnosis Date Noted  . Depression, recurrent ([place]) 04/06/2018  . Hyperlipidemia, mixed 04/06/2018  . HTN (hypertension) 01/12/2015  . BPH (benign prostatic hyperplasia) 01/12/2015  . Nodule of kidney 02/14/2014  . Weakness 02/11/2014  . Recurrent colitis due to Clostridium  difficile 02/11/2014  . Occlusion and stenosis of carotid artery without mention of cerebral infarction 09/07/2012  . History of CVA (cerebrovascular accident) 02/18/2012  . Tobacco abuse 02/18/2012  . Non-compliance 02/18/2012  . COPD (chronic obstructive pulmonary disease) ([place]) 02/18/2012    Gabriela Eves, PT, DPT 03/21/2019, 2:08 PM  [facility] [address] [place], [place], [address] Phone: [phone]   Fax:  [phone]  Name: ANDRANIK GRIMA MRN: [id number] Date of Birth: 1950-08-26

## 2019-03-25 ENCOUNTER — Ambulatory Visit: Payer: Medicare HMO | Admitting: Physical Therapy

## 2019-03-25 ENCOUNTER — Encounter: Payer: Self-pay | Admitting: Physical Therapy

## 2019-03-25 ENCOUNTER — Other Ambulatory Visit: Payer: Self-pay

## 2019-03-25 DIAGNOSIS — M6281 Muscle weakness (generalized): Secondary | ICD-10-CM

## 2019-03-25 DIAGNOSIS — R296 Repeated falls: Secondary | ICD-10-CM | POA: Diagnosis not present

## 2019-03-25 DIAGNOSIS — R2681 Unsteadiness on feet: Secondary | ICD-10-CM

## 2019-03-25 NOTE — Therapy (Signed)
[facility] [place], [place], [address] Phone: [phone]   Fax:  [phone]  Physical Therapy Treatment  Patient Details  Name: Walter [name] MRN: [id number] Date of Birth: Aug 11, 1950 Referring Provider (PT): Caryl Pina, MD   Encounter Date: 03/25/2019  PT End of Session - 03/25/19 1116    Visit Number  2    Number of Visits  12    Date for PT Re-Evaluation  05/09/19    PT Start Time  [id number]    PT Stop Time  [id number]   limited by fatigue and late start to PT   PT Time Calculation (min)  35 min    Activity Tolerance  Patient tolerated treatment well;Patient limited by fatigue    Behavior During Therapy  Flat affect       Past Medical History:  Diagnosis Date  . Acute urinary retention 02/12/2014  . Brain TIA    recurrent   . C. difficile enteritis   . Carotid bruit   . COPD (chronic obstructive pulmonary disease) ([place])   . Coronary artery disease   . Myocardial infarction ([place])    incidental  . Nodule of kidney 02/14/14   solid on left  . Recurrent colitis due to Clostridium difficile 02/11/2014  . Stroke [facility]) 1997    Past Surgical History:  Procedure Laterality Date  . BACK SURGERY    . KNEE SURGERY      There were no vitals filed for this visit.  Subjective Assessment - 03/25/19 1133    Subjective  COVID 19 screening performed on patient upon arrival. Patient had no new complaints upon arrival.    Pertinent History  History of CVA and TIAs, fall risk, HTN, COPD, speech deficits    Limitations  House hold activities;Standing;Walking    Patient Stated Goals  improve balance, less difficulties with home activities, improve movement and strength    Currently in Pain?  No/denies         [facility] PT Assessment - 03/25/19 0001      Assessment   Medical Diagnosis  History of CVA, weakness, recurrent falls    Referring Provider (PT)  Caryl Pina, MD    Onset Date/Surgical Date  08/19/17    Next  MD Visit  04/12/2019    Prior Therapy  home health      Precautions   Precautions  [facility] Adult PT Treatment/Exercise - 03/25/19 0001      Exercises   Exercises  Knee/Hip;Lumbar      Lumbar Exercises: Seated   Other Seated Lumbar Exercises  4# ball core reachouts x15 reps     Other Seated Lumbar Exercises  B D2 synergy 4# x10 reps each      Knee/Hip Exercises: Aerobic   Nustep  L3 x15 min      Knee/Hip Exercises: Seated   Long Arc Quad  Strengthening;Both;2 sets;10 reps;Weights    Long Arc Quad Weight  2 lbs.    Ball Squeeze  x20 reps 4# ball    Marching  AROM;Both;2 sets;10 reps    Hamstring Curl  Strengthening;Both;2 sets;10 reps;Limitations    Hamstring Limitations  yellow theraband    Abduction/Adduction   Strengthening;Both;2 sets;10 reps;Limitations    Abd/Adduction Limitations  yellow theraband; isolated for better technique    Sit to Sand  10 reps;with UE support  PT Long Term Goals - 03/21/19 1318      PT LONG TERM GOAL #1   Title  Patient will perform HEP with assistance from family.    Time  6    Period  Weeks    Status  New      PT LONG TERM GOAL #2   Title  Patient will demonstrate 3+/5 bilateral LE MMT to improve stability during functional tasks.    Time  6    Period  Weeks    Status  New      PT LONG TERM GOAL #3   Title  Patient will demonstrate modified 5x sit to stand in 40 seconds or less to decrease fall risk.    Time  6    Period  Weeks    Status  New            Plan - 03/25/19 1207    Clinical Impression Statement  Patient presented in clinic with no complaints upon arrival. Patient did report that primarily reason for falls are weakness of LEs and fatigue. Patient observed ambulating with short stride length and furniture walking. Patient able to complete therex fairly well although mod-max multimodal cueing for proper technique. Greater LLE weakness noted during seated  therex especially in quad.    Personal Factors and Comorbidities  Age;Time since onset of injury/illness/exacerbation;Comorbidity 2    Comorbidities  HTN, COPD, history of CVA and TIAs, fall risk    Examination-Activity Limitations  Bathing;Dressing;Stand;Hygiene/Grooming;Stairs;Locomotion Level;Transfers    Stability/Clinical Decision Making  Evolving/Moderate complexity    Rehab Potential  Fair    PT Frequency  2x / week    PT Duration  6 weeks    PT Treatment/Interventions  ADLs/Self Care Home Management;Neuromuscular re-education;Balance training;Therapeutic exercise;Therapeutic activities;Functional mobility training;Stair training;Gait training;Patient/family education;Manual techniques;Passive range of motion;Manual lymph drainage    PT Next Visit Plan  Nustep, gentle strengthening of LEs, balance in sitting and standing. Gait training    PT Home Exercise Plan  see patient education section    Consulted and Agree with Plan of Care  Patient;Family member/caregiver    Family Member Consulted  Wife       Patient will benefit from skilled therapeutic intervention in order to improve the following deficits and impairments:  Abnormal gait, Decreased activity tolerance, Decreased balance, Decreased strength, Difficulty walking, Decreased safety awareness  Visit Diagnosis: Unsteadiness on feet  Muscle weakness (generalized)  Repeated falls     Problem List Patient Active Problem List   Diagnosis Date Noted  . Depression, recurrent ([place]) 04/06/2018  . Hyperlipidemia, mixed 04/06/2018  . HTN (hypertension) 01/12/2015  . BPH (benign prostatic hyperplasia) 01/12/2015  . Nodule of kidney 02/14/2014  . Weakness 02/11/2014  . Recurrent colitis due to Clostridium difficile 02/11/2014  . Occlusion and stenosis of carotid artery without mention of cerebral infarction 09/07/2012  . History of CVA (cerebrovascular accident) 02/18/2012  . Tobacco abuse 02/18/2012  . Non-compliance  02/18/2012  . COPD (chronic obstructive pulmonary disease) ([place]) 02/18/2012    Standley Brooking, PTA 03/25/2019, 12:16 PM  [facility] [address] [place], [place], [address] Phone: [phone]   Fax:  [phone]  Name: KESAN CHRISTENBURY MRN: [id number] Date of Birth: [birth year]

## 2019-03-27 ENCOUNTER — Ambulatory Visit: Payer: Medicare HMO | Admitting: Physical Therapy

## 2019-03-27 ENCOUNTER — Encounter: Payer: Self-pay | Admitting: Physical Therapy

## 2019-03-27 ENCOUNTER — Other Ambulatory Visit: Payer: Self-pay

## 2019-03-27 DIAGNOSIS — R2681 Unsteadiness on feet: Secondary | ICD-10-CM | POA: Diagnosis not present

## 2019-03-27 DIAGNOSIS — M6281 Muscle weakness (generalized): Secondary | ICD-10-CM | POA: Diagnosis not present

## 2019-03-27 DIAGNOSIS — R296 Repeated falls: Secondary | ICD-10-CM | POA: Diagnosis not present

## 2019-03-27 NOTE — Therapy (Signed)
[facility] [place], [place], [address] Phone: [phone]   Fax:  [phone]  Physical Therapy Treatment  Patient Details  Name: Walter [name] MRN: [id number] Date of Birth: April 15, 1950 Referring Provider (PT): Caryl Pina, MD   Encounter Date: 03/27/2019  PT End of Session - 03/27/19 1122    Visit Number  3    Number of Visits  12    Date for PT Re-Evaluation  05/09/19    PT Start Time  1116    PT Stop Time  1156    PT Time Calculation (min)  40 min    Activity Tolerance  Patient tolerated treatment well;Patient limited by fatigue    Behavior During Therapy  Flat affect       Past Medical History:  Diagnosis Date  . Acute urinary retention 02/12/2014  . Brain TIA    recurrent   . C. difficile enteritis   . Carotid bruit   . COPD (chronic obstructive pulmonary disease) ([place])   . Coronary artery disease   . Myocardial infarction ([place])    incidental  . Nodule of kidney 02/14/14   solid on left  . Recurrent colitis due to Clostridium difficile 02/11/2014  . Stroke [facility]) 1997    Past Surgical History:  Procedure Laterality Date  . BACK SURGERY    . KNEE SURGERY      There were no vitals filed for this visit.  Subjective Assessment - 03/27/19 1119    Subjective  COVID 19 screening performed on patient upon arrival. Patient reports soreness following previous treatment.    Pertinent History  History of CVA and TIAs, fall risk, HTN, COPD, speech deficits    Limitations  House hold activities;Standing;Walking    Patient Stated Goals  improve balance, less difficulties with home activities, improve movement and strength    Currently in Pain?  No/denies                       [facility] Adult PT Treatment/Exercise - 03/27/19 0001      Lumbar Exercises: Seated   Other Seated Lumbar Exercises  4# ball reachouts with trunk lean x10 reps    Other Seated Lumbar Exercises  B D2 with trunk lean 4#  ball x10 reps each      Knee/Hip Exercises: Aerobic   Nustep  L3 x18 min      Knee/Hip Exercises: Seated   Long Arc Quad  Strengthening;Both;2 sets;10 reps;Weights    Long Arc Quad Weight  2 lbs.    Clamshell with [employer]ra[employer]  Red   2x10 reps   Marching  Strengthening;Both;2 sets;10 reps;Limitations    Marching Limitations  red theraband    Hamstring Curl  Strengthening;Both;2 sets;10 reps;Limitations    Hamstring Limitations  red theraband    Sit to Sand  2 sets;10 reps;with UE support                  PT Long Term Goals - 03/21/19 1318      PT LONG TERM GOAL #1   Title  Patient will perform HEP with assistance from family.    Time  6    Period  Weeks    Status  New      PT LONG TERM GOAL #2   Title  Patient will demonstrate 3+/5 bilateral LE MMT to improve stability during functional tasks.    Time  6    Period  Weeks    Status  New      PT LONG TERM GOAL #3   Title  Patient will demonstrate modified 5x sit to stand in 40 seconds or less to decrease fall risk.    Time  6    Period  Weeks    Status  New            Plan - 03/27/19 1202    Clinical Impression Statement  Patient presented in clinic with reports of soreness after previous treatment. Patient able to complete therex fairly well although greater weakness noted in LLE. More core strengthening completed today with greater emphasis on trunk lean with seated reachouts. Patient furniture walks more with fatigue at end of treatment. Patient has ADs at home per patient report.    Personal Factors and Comorbidities  Age;Time since onset of injury/illness/exacerbation;Comorbidity 2    Comorbidities  HTN, COPD, history of CVA and TIAs, fall risk    Examination-Activity Limitations  Bathing;Dressing;Stand;Hygiene/Grooming;Stairs;Locomotion Level;Transfers    Stability/Clinical Decision Making  Evolving/Moderate complexity    Rehab Potential  Fair    PT Frequency  2x / week    PT Duration  6 weeks    PT  Treatment/Interventions  ADLs/Self Care Home Management;Neuromuscular re-education;Balance training;Therapeutic exercise;Therapeutic activities;Functional mobility training;Stair training;Gait training;Patient/family education;Manual techniques;Passive range of motion;Manual lymph drainage    PT Next Visit Plan  Nustep, gentle strengthening of LEs, balance in sitting and standing. Gait training    PT Home Exercise Plan  see patient education section    Consulted and Agree with Plan of Care  Patient       Patient will benefit from skilled therapeutic intervention in order to improve the following deficits and impairments:  Abnormal gait, Decreased activity tolerance, Decreased balance, Decreased strength, Difficulty walking, Decreased safety awareness  Visit Diagnosis: Unsteadiness on feet  Muscle weakness (generalized)  Repeated falls     Problem List Patient Active Problem List   Diagnosis Date Noted  . Depression, recurrent ([place]) 04/06/2018  . Hyperlipidemia, mixed 04/06/2018  . HTN (hypertension) 01/12/2015  . BPH (benign prostatic hyperplasia) 01/12/2015  . Nodule of kidney 02/14/2014  . Weakness 02/11/2014  . Recurrent colitis due to Clostridium difficile 02/11/2014  . Occlusion and stenosis of carotid artery without mention of cerebral infarction 09/07/2012  . History of CVA (cerebrovascular accident) 02/18/2012  . Tobacco abuse 02/18/2012  . Non-compliance 02/18/2012  . COPD (chronic obstructive pulmonary disease) ([place]) 02/18/2012    Standley Brooking, PTA 03/27/2019, 12:06 PM  [facility] [address] [place], [place], [address] Phone: [phone]   Fax:  [phone]  Name: Walter [name] MRN: [id number] Date of Birth: 1950/07/17

## 2019-04-02 ENCOUNTER — Encounter: Payer: Self-pay | Admitting: Physical Therapy

## 2019-04-02 ENCOUNTER — Ambulatory Visit: Payer: Medicare HMO | Admitting: Physical Therapy

## 2019-04-02 ENCOUNTER — Other Ambulatory Visit: Payer: Self-pay

## 2019-04-02 DIAGNOSIS — R2681 Unsteadiness on feet: Secondary | ICD-10-CM | POA: Diagnosis not present

## 2019-04-02 DIAGNOSIS — M6281 Muscle weakness (generalized): Secondary | ICD-10-CM | POA: Diagnosis not present

## 2019-04-02 DIAGNOSIS — R296 Repeated falls: Secondary | ICD-10-CM | POA: Diagnosis not present

## 2019-04-02 NOTE — Therapy (Signed)
[facility] [place], [place], [address] Phone: [phone]   Fax:  [phone]  Physical Therapy Treatment  Patient Details  Name: Walter [name] MRN: [id number] Date of Birth: 10-03-1950 Referring Provider (PT): Caryl Pina, MD   Encounter Date: 04/02/2019  PT End of Session - 04/02/19 1313    Visit Number  4    Number of Visits  12    Date for PT Re-Evaluation  05/09/19    PT Start Time  [id number]    PT Stop Time  1342    PT Time Calculation (min)  39 min    Equipment Utilized During Treatment  Other (comment)   SPC   Activity Tolerance  Patient tolerated treatment well;Patient limited by fatigue    Behavior During Therapy  Flat affect       Past Medical History:  Diagnosis Date  . Acute urinary retention 02/12/2014  . Brain TIA    recurrent   . C. difficile enteritis   . Carotid bruit   . COPD (chronic obstructive pulmonary disease) ([place])   . Coronary artery disease   . Myocardial infarction ([place])    incidental  . Nodule of kidney 02/14/14   solid on left  . Recurrent colitis due to Clostridium difficile 02/11/2014  . Stroke [facility]) 1997    Past Surgical History:  Procedure Laterality Date  . BACK SURGERY    . KNEE SURGERY      There were no vitals filed for this visit.  Subjective Assessment - 04/02/19 1312    Subjective  COVID 19 screening performed on patient upon arrival. Patient reports some fatigue upon arrival.    Pertinent History  History of CVA and TIAs, fall risk, HTN, COPD, speech deficits    Limitations  House hold activities;Standing;Walking    Patient Stated Goals  improve balance, less difficulties with home activities, improve movement and strength    Currently in Pain?  No/denies         [facility] PT Assessment - 04/02/19 0001      Assessment   Medical Diagnosis  History of CVA, weakness, recurrent falls    Referring Provider (PT)  Caryl Pina, MD    Onset Date/Surgical Date   08/19/17    Next MD Visit  04/12/2019    Prior Therapy  home health      Precautions   Precautions  Fall                   [facility] Adult PT Treatment/Exercise - 04/02/19 0001      Lumbar Exercises: Seated   Other Seated Lumbar Exercises  4# ball reachouts with trunk lean x15 reps    Other Seated Lumbar Exercises  B D2 with trunk lean 4# ball x15 reps each      Knee/Hip Exercises: Aerobic   Nustep  L4 x15 min      Knee/Hip Exercises: Standing   Hip Flexion  AROM;Both;20 reps;Knee bent    Hip Abduction  AROM;Both;20 reps;Knee straight      Knee/Hip Exercises: Seated   Long Arc Quad  Strengthening;Both;2 sets;20 reps;Weights    Long Arc Quad Weight  3 lbs.    Ball Squeeze  2x20 reps    Clamshell with [employer]   x20 reps   Marching  Strengthening;Both;2 sets;20 reps;Limitations    Marching Limitations  red theraband    Hamstring Curl  Strengthening;Both;2 sets;10 reps;Limitations    Hamstring Limitations  red theraband  Sit to Sand  15 reps;without UE support                  PT Long Term Goals - 03/21/19 1318      PT LONG TERM GOAL #1   Title  Patient will perform HEP with assistance from family.    Time  6    Period  Weeks    Status  New      PT LONG TERM GOAL #2   Title  Patient will demonstrate 3+/5 bilateral LE MMT to improve stability during functional tasks.    Time  6    Period  Weeks    Status  New      PT LONG TERM GOAL #3   Title  Patient will demonstrate modified 5x sit to stand in 40 seconds or less to decrease fall risk.    Time  6    Period  Weeks    Status  New            Plan - 04/02/19 1355    Clinical Impression Statement  Patient presented in clinic with no complaints of any pain, only fatigue. Patient progressed with more sets of exercises although with the resistance. Seated core strengthening continued with mirroring provided by PTA to ensure proper technique. Patient ambulated into clinic with [facility] and  utilized during treatment.    Personal Factors and Comorbidities  Age;Time since onset of injury/illness/exacerbation;Comorbidity 2    Comorbidities  HTN, COPD, history of CVA and TIAs, fall risk    Examination-Activity Limitations  Bathing;Dressing;Stand;Hygiene/Grooming;Stairs;Locomotion Level;Transfers    Stability/Clinical Decision Making  Evolving/Moderate complexity    Rehab Potential  Fair    PT Frequency  2x / week    PT Duration  6 weeks    PT Treatment/Interventions  ADLs/Self Care Home Management;Neuromuscular re-education;Balance training;Therapeutic exercise;Therapeutic activities;Functional mobility training;Stair training;Gait training;Patient/family education;Manual techniques;Passive range of motion;Manual lymph drainage    PT Next Visit Plan  Nustep, gentle strengthening of LEs, balance in sitting and standing. Gait training    PT Home Exercise Plan  see patient education section    Consulted and Agree with Plan of Care  Patient       Patient will benefit from skilled therapeutic intervention in order to improve the following deficits and impairments:  Abnormal gait, Decreased activity tolerance, Decreased balance, Decreased strength, Difficulty walking, Decreased safety awareness  Visit Diagnosis: Unsteadiness on feet  Muscle weakness (generalized)  Repeated falls     Problem List Patient Active Problem List   Diagnosis Date Noted  . Depression, recurrent ([place]) 04/06/2018  . Hyperlipidemia, mixed 04/06/2018  . HTN (hypertension) 01/12/2015  . BPH (benign prostatic hyperplasia) 01/12/2015  . Nodule of kidney 02/14/2014  . Weakness 02/11/2014  . Recurrent colitis due to Clostridium difficile 02/11/2014  . Occlusion and stenosis of carotid artery without mention of cerebral infarction 09/07/2012  . History of CVA (cerebrovascular accident) 02/18/2012  . Tobacco abuse 02/18/2012  . Non-compliance 02/18/2012  . COPD (chronic obstructive pulmonary disease) ([place])  02/18/2012    Standley Brooking, PTA 04/02/2019, 2:07 PM  [facility] [address] [place], [place], [address] Phone: [phone]   Fax:  [phone]  Name: KYNAN BEHNEN MRN: [id number] Date of Birth: [birth year]

## 2019-04-09 ENCOUNTER — Other Ambulatory Visit: Payer: Self-pay

## 2019-04-09 ENCOUNTER — Encounter: Payer: Self-pay | Admitting: Physical Therapy

## 2019-04-09 ENCOUNTER — Ambulatory Visit: Payer: Medicare HMO | Attending: Family Medicine | Admitting: Physical Therapy

## 2019-04-09 DIAGNOSIS — R2681 Unsteadiness on feet: Secondary | ICD-10-CM | POA: Diagnosis not present

## 2019-04-09 DIAGNOSIS — M6281 Muscle weakness (generalized): Secondary | ICD-10-CM | POA: Diagnosis not present

## 2019-04-09 DIAGNOSIS — R296 Repeated falls: Secondary | ICD-10-CM | POA: Diagnosis not present

## 2019-04-09 NOTE — Therapy (Signed)
[facility] [place], [place], [address] Phone: [phone]   Fax:  [phone]  Physical Therapy Treatment  Patient Details  Name: Walter [name] MRN: [id number] Date of Birth: February 28, 1951 Referring Provider (PT): Caryl Pina, MD   Encounter Date: 04/09/2019  PT End of Session - 04/09/19 1313    Visit Number  5    Number of Visits  12    Date for PT Re-Evaluation  05/09/19    PT Start Time  1301    PT Stop Time  1341    PT Time Calculation (min)  40 min    Activity Tolerance  Patient tolerated treatment well;Patient limited by fatigue    Behavior During Therapy  Flat affect       Past Medical History:  Diagnosis Date  . Acute urinary retention 02/12/2014  . Brain TIA    recurrent   . C. difficile enteritis   . Carotid bruit   . COPD (chronic obstructive pulmonary disease) ([place])   . Coronary artery disease   . Myocardial infarction ([place])    incidental  . Nodule of kidney 02/14/14   solid on left  . Recurrent colitis due to Clostridium difficile 02/11/2014  . Stroke [facility]) 1997    Past Surgical History:  Procedure Laterality Date  . BACK SURGERY    . KNEE SURGERY      There were no vitals filed for this visit.  Subjective Assessment - 04/09/19 1313    Subjective  COVID 19 screening performed on patient upon arrival. No complaints of pain.    Pertinent History  History of CVA and TIAs, fall risk, HTN, COPD, speech deficits    Limitations  House hold activities;Standing;Walking    Patient Stated Goals  improve balance, less difficulties with home activities, improve movement and strength    Currently in Pain?  No/denies         [facility] PT Assessment - 04/09/19 0001      Assessment   Medical Diagnosis  History of CVA, weakness, recurrent falls    Referring Provider (PT)  Caryl Pina, MD    Onset Date/Surgical Date  08/19/17    Next MD Visit  04/12/2019    Prior Therapy  home health       Precautions   Precautions  Fall                   [facility] Adult PT Treatment/Exercise - 04/09/19 0001      Knee/Hip Exercises: Aerobic   Nustep  L4 x16 min      Knee/Hip Exercises: Standing   Heel Raises  Both;20 reps    Heel Raises Limitations  B toe raise x20 reps    Hip Flexion  AROM;Both;15 reps;Knee bent    Hip Abduction  AROM;Both;15 reps;Knee straight      Knee/Hip Exercises: Seated   Long Arc Quad  Strengthening;Both;2 sets;15 reps;Weights    Long Arc Quad Weight  3 lbs.    Marching  Strengthening;Both;2 sets;10 reps;Weights    Marching Limitations  3    Abduction/Adduction   Strengthening;Both;20 reps;Limitations    Abd/Adduction Limitations  red theraband    Sit to Sand  without UE support;15 reps   intermittant multiple reps required                 PT Long Term Goals - 04/09/19 1331      PT LONG TERM GOAL #1   Title  Patient will perform HEP  with assistance from family.    Time  6    Period  Weeks    Status  On-going      PT LONG TERM GOAL #2   Title  Patient will demonstrate 3+/5 bilateral LE MMT to improve stability during functional tasks.    Time  6    Period  Weeks    Status  On-going      PT LONG TERM GOAL #3   Title  Patient will demonstrate modified 5x sit to stand in 40 seconds or less to decrease fall risk.    Time  6    Period  Weeks    Status  Achieved   in28 sec 04/09/2019           Plan - 04/09/19 1358    Clinical Impression Statement  Patient presented in clinic with recall of a fall but unable to recall how recent. Reason for fall indicated as LE weakness per patient report. Patient required more attempts during sit <> stands during treatment today secondary to weakness. Patient especially weak throughout therex.    Personal Factors and Comorbidities  Age;Time since onset of injury/illness/exacerbation;Comorbidity 2    Comorbidities  HTN, COPD, history of CVA and TIAs, fall risk    Examination-Activity  Limitations  Bathing;Dressing;Stand;Hygiene/Grooming;Stairs;Locomotion Level;Transfers    Stability/Clinical Decision Making  Evolving/Moderate complexity    Rehab Potential  Fair    PT Frequency  2x / week    PT Duration  6 weeks    PT Treatment/Interventions  ADLs/Self Care Home Management;Neuromuscular re-education;Balance training;Therapeutic exercise;Therapeutic activities;Functional mobility training;Stair training;Gait training;Patient/family education;Manual techniques;Passive range of motion;Manual lymph drainage    PT Next Visit Plan  Nustep, gentle strengthening of LEs, balance in sitting and standing. Gait training    PT Home Exercise Plan  see patient education section    Consulted and Agree with Plan of Care  Patient       Patient will benefit from skilled therapeutic intervention in order to improve the following deficits and impairments:  Abnormal gait, Decreased activity tolerance, Decreased balance, Decreased strength, Difficulty walking, Decreased safety awareness  Visit Diagnosis: Unsteadiness on feet  Muscle weakness (generalized)  Repeated falls     Problem List Patient Active Problem List   Diagnosis Date Noted  . Depression, recurrent ([place]) 04/06/2018  . Hyperlipidemia, mixed 04/06/2018  . HTN (hypertension) 01/12/2015  . BPH (benign prostatic hyperplasia) 01/12/2015  . Nodule of kidney 02/14/2014  . Weakness 02/11/2014  . Recurrent colitis due to Clostridium difficile 02/11/2014  . Occlusion and stenosis of carotid artery without mention of cerebral infarction 09/07/2012  . History of CVA (cerebrovascular accident) 02/18/2012  . Tobacco abuse 02/18/2012  . Non-compliance 02/18/2012  . COPD (chronic obstructive pulmonary disease) ([place]) 02/18/2012    Standley Brooking, PTA 04/09/2019, 2:08 PM  [facility] [address] [place], [place], [address] Phone: [phone]   Fax:  [phone]  Name: Walter [name] MRN: [id number] Date of Birth: 08/18/1950

## 2019-04-11 ENCOUNTER — Encounter: Payer: Self-pay | Admitting: Physical Therapy

## 2019-04-11 ENCOUNTER — Ambulatory Visit: Payer: Medicare HMO | Admitting: Physical Therapy

## 2019-04-11 ENCOUNTER — Other Ambulatory Visit: Payer: Self-pay

## 2019-04-11 DIAGNOSIS — R2681 Unsteadiness on feet: Secondary | ICD-10-CM | POA: Diagnosis not present

## 2019-04-11 DIAGNOSIS — R296 Repeated falls: Secondary | ICD-10-CM

## 2019-04-11 DIAGNOSIS — M6281 Muscle weakness (generalized): Secondary | ICD-10-CM | POA: Diagnosis not present

## 2019-04-11 NOTE — Therapy (Signed)
[facility] [place], [place], [address] Phone: [phone]   Fax:  [phone]  Physical Therapy Treatment  Patient Details  Name: Walter [name] MRN: [id number] Date of Birth: [birth year] Referring Provider (PT): Caryl Pina, MD   Encounter Date: 04/11/2019  PT End of Session - 04/11/19 1303    Visit Number  6    Number of Visits  12    Date for PT Re-Evaluation  05/09/19    PT Start Time  1302    PT Stop Time  1342    PT Time Calculation (min)  40 min    Equipment Utilized During Treatment  Other (comment)   SPC   Activity Tolerance  Patient tolerated treatment well    Behavior During Therapy  Flat affect       Past Medical History:  Diagnosis Date  . Acute urinary retention 02/12/2014  . Brain TIA    recurrent   . C. difficile enteritis   . Carotid bruit   . COPD (chronic obstructive pulmonary disease) ([place])   . Coronary artery disease   . Myocardial infarction ([place])    incidental  . Nodule of kidney 02/14/14   solid on left  . Recurrent colitis due to Clostridium difficile 02/11/2014  . Stroke [facility]) 1997    Past Surgical History:  Procedure Laterality Date  . BACK SURGERY    . KNEE SURGERY      There were no vitals filed for this visit.  Subjective Assessment - 04/11/19 1303    Subjective  COVID 19 screening performed on patient upon arrival. No complaints of pain.    Pertinent History  History of CVA and TIAs, fall risk, HTN, COPD, speech deficits    Limitations  House hold activities;Standing;Walking    Patient Stated Goals  improve balance, less difficulties with home activities, improve movement and strength    Currently in Pain?  No/denies         [facility] PT Assessment - 04/11/19 0001      Assessment   Medical Diagnosis  History of CVA, weakness, recurrent falls    Referring Provider (PT)  Caryl Pina, MD    Onset Date/Surgical Date  08/19/17    Next MD Visit  04/12/2019    Prior  Therapy  home health      Precautions   Precautions  Fall                   [facility] Adult PT Treatment/Exercise - 04/11/19 0001      Lumbar Exercises: Seated   Other Seated Lumbar Exercises  4# D2 in sitting x20 reps each      Knee/Hip Exercises: Aerobic   Nustep  L4 x18 min      Knee/Hip Exercises: Standing   Heel Raises  Both;20 reps    Heel Raises Limitations  B toe raise x20 reps    Hip Flexion  AROM;Both;20 reps;Knee bent    Hip Abduction  AROM;Both;20 reps;Knee straight    Functional Squat  20 reps;3 seconds      Knee/Hip Exercises: Seated   Long Arc Quad  Strengthening;Both;2 sets;15 reps;Weights    Long Arc Quad Weight  3 lbs.    Marching  Strengthening;Both;2 sets;15 reps;Weights    Marching Limitations  3    Abduction/Adduction   Strengthening;Both;20 reps;Limitations    Abd/Adduction Limitations  red theraband                  PT  Long Term Goals - 04/09/19 1331      PT LONG TERM GOAL #1   Title  Patient will perform HEP with assistance from family.    Time  6    Period  Weeks    Status  On-going      PT LONG TERM GOAL #2   Title  Patient will demonstrate 3+/5 bilateral LE MMT to improve stability during functional tasks.    Time  6    Period  Weeks    Status  On-going      PT LONG TERM GOAL #3   Title  Patient will demonstrate modified 5x sit to stand in 40 seconds or less to decrease fall risk.    Time  6    Period  Weeks    Status  Achieved   in28 sec 04/09/2019           Plan - 04/11/19 1357    Clinical Impression Statement  Patient presented in clinic with no complaints upon arrival. Patient guided through LE strengthening with more reps able to be tolerated with therex. Core exercises also completed per weakness. Patient struggled with R sidestepping upon observation at countertops and required VCs. Patient embulated into clinic with [facility] and had minimal LOB with SPC. Minimal LOB occured primarily after transiitoning from  sit > stand or with turning.    Personal Factors and Comorbidities  Age;Time since onset of injury/illness/exacerbation;Comorbidity 2    Comorbidities  HTN, COPD, history of CVA and TIAs, fall risk    Examination-Activity Limitations  Bathing;Dressing;Stand;Hygiene/Grooming;Stairs;Locomotion Level;Transfers    Stability/Clinical Decision Making  Evolving/Moderate complexity    Rehab Potential  Fair    PT Frequency  2x / week    PT Duration  6 weeks    PT Treatment/Interventions  ADLs/Self Care Home Management;Neuromuscular re-education;Balance training;Therapeutic exercise;Therapeutic activities;Functional mobility training;Stair training;Gait training;Patient/family education;Manual techniques;Passive range of motion;Manual lymph drainage    PT Next Visit Plan  Nustep, gentle strengthening of LEs, balance in sitting and standing. Gait training    PT Home Exercise Plan  see patient education section    Consulted and Agree with Plan of Care  Patient       Patient will benefit from skilled therapeutic intervention in order to improve the following deficits and impairments:  Abnormal gait, Decreased activity tolerance, Decreased balance, Decreased strength, Difficulty walking, Decreased safety awareness  Visit Diagnosis: Unsteadiness on feet  Muscle weakness (generalized)  Repeated falls     Problem List Patient Active Problem List   Diagnosis Date Noted  . Depression, recurrent ([place]) 04/06/2018  . Hyperlipidemia, mixed 04/06/2018  . HTN (hypertension) 01/12/2015  . BPH (benign prostatic hyperplasia) 01/12/2015  . Nodule of kidney 02/14/2014  . Weakness 02/11/2014  . Recurrent colitis due to Clostridium difficile 02/11/2014  . Occlusion and stenosis of carotid artery without mention of cerebral infarction 09/07/2012  . History of CVA (cerebrovascular accident) 02/18/2012  . Tobacco abuse 02/18/2012  . Non-compliance 02/18/2012  . COPD (chronic obstructive pulmonary disease)  ([place]) 02/18/2012    Standley Brooking, PTA 04/11/2019, 2:09 PM  [facility] [address] [place], [place], [address] Phone: [phone]   Fax:  [phone]  Name: MARQUICE CRABBE MRN: [id number] Date of Birth: 04-18-1950

## 2019-04-12 ENCOUNTER — Ambulatory Visit (INDEPENDENT_AMBULATORY_CARE_PROVIDER_SITE_OTHER): Payer: Medicare HMO | Admitting: Family Medicine

## 2019-04-12 ENCOUNTER — Other Ambulatory Visit: Payer: Self-pay

## 2019-04-12 ENCOUNTER — Encounter: Payer: Self-pay | Admitting: Family Medicine

## 2019-04-12 VITALS — BP 158/84 | HR 74 | Temp 97.1°F | Ht 70.5 in | Wt 188.4 lb

## 2019-04-12 DIAGNOSIS — E782 Mixed hyperlipidemia: Secondary | ICD-10-CM | POA: Diagnosis not present

## 2019-04-12 DIAGNOSIS — J449 Chronic obstructive pulmonary disease, unspecified: Secondary | ICD-10-CM | POA: Diagnosis not present

## 2019-04-12 DIAGNOSIS — F339 Major depressive disorder, recurrent, unspecified: Secondary | ICD-10-CM | POA: Diagnosis not present

## 2019-04-12 DIAGNOSIS — I10 Essential (primary) hypertension: Secondary | ICD-10-CM | POA: Diagnosis not present

## 2019-04-12 DIAGNOSIS — B351 Tinea unguium: Secondary | ICD-10-CM

## 2019-04-12 MED ORDER — TERBINAFINE HCL 250 MG PO TABS: 250.0000 mg | ORAL_TABLET | Freq: Every day | ORAL | 3 refills | Status: DC

## 2019-04-12 NOTE — Progress Notes (Signed)
BP (!) 158/84   Pulse 74   Temp (!) 97.1 F (36.2 C) (Temporal)   Ht 5' 10.5" (1.791 m)   Wt 188 lb 6.4 oz (85.5 kg)   SpO2 99%   BMI 26.65 kg/m    Subjective:   Patient ID: Walter [name], male    DOB: September 27, 1950, 69 y.o.   MRN: [id number]  HPI: Walter [name] is a 69 y.o. male presenting on 04/12/2019 for Hypertension (6 month follow up)   HPI Hypertension Patient is currently on amlodipine, and their blood pressure today is 158/84. Patient denies any lightheadedness or dizziness. Patient denies headaches, blurred vision, chest pains, shortness of breath, or weakness. Denies any side effects from medication and is content with current medication.   Hyperlipidemia Patient is coming in for recheck of his hyperlipidemia. The patient is currently taking atorvastatin. They deny any issues with myalgias or history of liver damage from it. They deny any focal numbness or weakness or chest pain.   Depression Patient is coming in for recheck of depression, he is currently taking Wellbutrin.  Patient feels like his mood is doing very well and denies any major issues. Depression screen Walter [name] 2/9 04/12/2019 10/10/2018 08/17/2018 05/08/2018 04/06/2018  Decreased Interest 0 0 0 1 0  Down, Depressed, Hopeless 0 0 0 2 0  PHQ - 2 Score 0 0 0 3 0  Altered sleeping - - - 1 -  Tired, decreased energy - - - 2 -  Change in appetite - - - 0 -  Feeling bad or failure about yourself  - - - 0 -  Trouble concentrating - - - 1 -  Moving slowly or fidgety/restless - - - 2 -  Suicidal thoughts - - - 0 -  PHQ-9 Score - - - 9 -  Difficult doing work/chores - - - Somewhat difficult -  Some recent data might be hidden     Patient is coming in for recheck of onychomycosis he is taking terbinafine currently at least feels like it is working well for him and the toenails are starting to clear up, he would like to continue forward with it a little bit longer.  Relevant past medical, surgical, family and social history  reviewed and updated as indicated. Interim medical history since our last visit reviewed. Allergies and medications reviewed and updated.  Review of Systems  Constitutional: Negative for chills and fever.  Eyes: Negative for visual disturbance.  Respiratory: Negative for shortness of breath and wheezing.   Cardiovascular: Negative for chest pain and leg swelling.  Musculoskeletal: Negative for back pain and gait problem.  Skin: Positive for color change. Negative for rash.  Neurological: Negative for dizziness, weakness and light-headedness.  All other systems reviewed and are negative.   Per HPI unless specifically indicated above   Allergies as of 04/12/2019   No Known Allergies     Medication List       Accurate as of April 12, 2019 10:37 AM. If you have any questions, ask your nurse or doctor.        amLODipine 5 MG tablet Commonly known as: NORVASC Take 1 tablet (5 mg total) by mouth daily.   atorvastatin 40 MG tablet Commonly known as: LIPITOR Take 1 tablet (40 mg total) by mouth daily.   buPROPion 300 MG 24 hr tablet Commonly known as: WELLBUTRIN XL Take 1 tablet (300 mg total) by mouth daily.   clopidogrel 75 MG tablet Commonly known as: PLAVIX Take 1  tablet (75 mg total) by mouth daily.   terbinafine 250 MG tablet Commonly known as: LAMISIL Take 1 tablet (250 mg total) by mouth daily.        Objective:   BP (!) 158/84   Pulse 74   Temp (!) 97.1 F (36.2 C) (Temporal)   Ht 5' 10.5" (1.791 m)   Wt 188 lb 6.4 oz (85.5 kg)   SpO2 99%   BMI 26.65 kg/m   Wt Readings from Last 3 Encounters:  04/12/19 188 lb 6.4 oz (85.5 kg)  10/10/18 191 lb 6.4 oz (86.8 kg)  08/17/18 191 lb 3.2 oz (86.7 kg)    Physical Exam Vitals and nursing note reviewed.  Constitutional:      General: He is not in acute distress.    Appearance: He is well-developed. He is not diaphoretic.  Eyes:     General: No scleral icterus.    Conjunctiva/sclera: Conjunctivae normal.    Neck:     Thyroid: No thyromegaly.  Cardiovascular:     Rate and Rhythm: Normal rate and regular rhythm.     Heart sounds: Normal heart sounds. No murmur.  Pulmonary:     Effort: Pulmonary effort is normal. No respiratory distress.     Breath sounds: Normal breath sounds. No wheezing.  Musculoskeletal:        General: Normal range of motion.     Cervical back: Neck supple.  Lymphadenopathy:     Cervical: No cervical adenopathy.  Skin:    General: Skin is warm and dry.     Findings: No rash.     Comments: Thickened and yellowed toenails on both great toes  Neurological:     Mental Status: He is alert and oriented to person, place, and time.     Coordination: Coordination normal.  Psychiatric:        Behavior: Behavior normal.       Assessment & Plan:   Problem List Items Addressed This Visit      Cardiovascular and Mediastinum   HTN (hypertension) - Primary   Relevant Orders   CBC with Differential/Platelet   CMP14+EGFR     Respiratory   COPD (chronic obstructive pulmonary disease) ([place])   Relevant Orders   CBC with Differential/Platelet     Other   Depression, recurrent ([facility])   Hyperlipidemia, mixed   Relevant Orders   Lipid panel    Other Visit Diagnoses    Onychomycosis       Relevant Medications   terbinafine (LAMISIL) 250 MG tablet      Will refill terbinafine and keep his current medication, will do blood work and see back in 6 months Follow up plan: Return in about 6 months (around 10/10/2019), or if symptoms worsen or fail to improve, for Hypertension and cholesterol and onychomycosis recheck.  Counseling provided for all of the vaccine components No orders of the defined types were placed in this encounter.   Caryl Pina, MD [facility] 04/12/2019, 10:37 AM

## 2019-04-13 LAB — CBC WITH DIFFERENTIAL/PLATELET
Basophils Absolute: 0.1 10*3/uL (ref 0.0–0.2)
Basos: 1 %
EOS (ABSOLUTE): 0.2 10*3/uL (ref 0.0–0.4)
Eos: 3 %
Hematocrit: 51.3 % — ABNORMAL HIGH (ref 37.5–51.0)
Hemoglobin: 16.8 g/dL (ref 13.0–17.7)
Immature Grans (Abs): 0 10*3/uL (ref 0.0–0.1)
Immature Granulocytes: 0 %
Lymphocytes Absolute: 2.3 10*3/uL (ref 0.7–3.1)
Lymphs: 28 %
MCH: 30.2 pg (ref 26.6–33.0)
MCHC: 32.7 g/dL (ref 31.5–35.7)
MCV: 92 fL (ref 79–97)
Monocytes Absolute: 0.7 10*3/uL (ref 0.1–0.9)
Monocytes: 8 %
Neutrophils Absolute: 5 10*3/uL (ref 1.4–7.0)
Neutrophils: 60 %
Platelets: 253 10*3/uL (ref 150–450)
RBC: 5.56 x10E6/uL (ref 4.14–5.80)
RDW: 12.9 % (ref 11.6–15.4)
WBC: 8.3 10*3/uL (ref 3.4–10.8)

## 2019-04-13 LAB — CMP14+EGFR
ALT: 15 IU/L (ref 0–44)
AST: 14 IU/L (ref 0–40)
Albumin/Globulin Ratio: 1.5 (ref 1.2–2.2)
Albumin: 4.2 g/dL (ref 3.8–4.8)
Alkaline Phosphatase: 117 IU/L (ref 39–117)
BUN/Creatinine Ratio: 8 — ABNORMAL LOW (ref 10–24)
BUN: 7 mg/dL — ABNORMAL LOW (ref 8–27)
Bilirubin Total: 0.4 mg/dL (ref 0.0–1.2)
CO2: 23 mmol/L (ref 20–29)
Calcium: 9.4 mg/dL (ref 8.6–10.2)
Chloride: 104 mmol/L (ref 96–106)
Creatinine, Ser: 0.89 mg/dL (ref 0.76–1.27)
GFR calc Af Amer: 100 mL/min/{1.73_m2} (ref >59)
GFR calc non Af Amer: 87 mL/min/{1.73_m2} (ref >59)
Globulin, Total: 2.8 g/dL (ref 1.5–4.5)
Glucose: 89 mg/dL (ref 65–99)
Potassium: 4.5 mmol/L (ref 3.5–5.2)
Sodium: 139 mmol/L (ref 134–144)
Total Protein: 7 g/dL (ref 6.0–8.5)

## 2019-04-13 LAB — LIPID PANEL
Chol/HDL Ratio: 5.4 ratio — ABNORMAL HIGH (ref 0.0–5.0)
Cholesterol, Total: 167 mg/dL (ref 100–199)
HDL: 31 mg/dL — ABNORMAL LOW (ref >39)
LDL Chol Calc (NIH): 110 mg/dL — ABNORMAL HIGH (ref 0–99)
Triglycerides: 147 mg/dL (ref 0–149)
VLDL Cholesterol Cal: 26 mg/dL (ref 5–40)

## 2019-04-16 ENCOUNTER — Ambulatory Visit: Payer: Medicare HMO | Admitting: *Deleted

## 2019-04-16 ENCOUNTER — Other Ambulatory Visit: Payer: Self-pay

## 2019-04-16 DIAGNOSIS — R296 Repeated falls: Secondary | ICD-10-CM | POA: Diagnosis not present

## 2019-04-16 DIAGNOSIS — M6281 Muscle weakness (generalized): Secondary | ICD-10-CM

## 2019-04-16 DIAGNOSIS — R2681 Unsteadiness on feet: Secondary | ICD-10-CM | POA: Diagnosis not present

## 2019-04-16 NOTE — Therapy (Signed)
[facility] [place], [place], [address] Phone: [phone]   Fax:  [phone]  Physical Therapy Treatment  Patient Details  Name: Walter [name] MRN: [id number] Date of Birth: Dec 04, 1950 Referring Provider (PT): Caryl Pina, MD   Encounter Date: 04/16/2019  PT End of Session - 04/16/19 1306    Visit Number  7    Number of Visits  12    Date for PT Re-Evaluation  05/09/19    PT Start Time  1300    PT Stop Time  [id number]    PT Time Calculation (min)  48 min       Past Medical History:  Diagnosis Date  . Acute urinary retention 02/12/2014  . Brain TIA    recurrent   . C. difficile enteritis   . Carotid bruit   . COPD (chronic obstructive pulmonary disease) ([place])   . Coronary artery disease   . Myocardial infarction ([place])    incidental  . Nodule of kidney 02/14/14   solid on left  . Recurrent colitis due to Clostridium difficile 02/11/2014  . Stroke [facility]) 1997    Past Surgical History:  Procedure Laterality Date  . BACK SURGERY    . KNEE SURGERY      There were no vitals filed for this visit.  Subjective Assessment - 04/16/19 1305    Subjective  COVID 19 screening performed on patient upon arrival.  Doing ok today    Pertinent History  History of CVA and TIAs, fall risk, HTN, COPD, speech deficits    Limitations  House hold activities;Standing;Walking    Patient Stated Goals  improve balance, less difficulties with home activities, improve movement and strength                       [facility] Adult PT Treatment/Exercise - 04/16/19 0001      Knee/Hip Exercises: Aerobic   Nustep  L4 x16 min      Knee/Hip Exercises: Standing   Heel Raises  Both;20 reps    Heel Raises Limitations  B toe raise x20 reps    Hip Flexion  AROM;Both;20 reps;Knee bent    Hip Abduction  AROM;Both;20 reps;Knee straight    Functional Squat  20 reps;3 seconds    Rocker Board  5 minutes   PF/DF and calf stretching      Knee/Hip Exercises: Seated   Long Arc Quad  Strengthening;Both;2 sets;15 reps;Weights    Long Arc Quad Weight  3 lbs.    Clamshell with TheraBand  Red   3x10   Sit to Sand  without UE support;15 reps   intermittant multiple reps required, elevated mat                 PT Long Term Goals - 04/09/19 1331      PT LONG TERM GOAL #1   Title  Patient will perform HEP with assistance from family.    Time  6    Period  Weeks    Status  On-going      PT LONG TERM GOAL #2   Title  Patient will demonstrate 3+/5 bilateral LE MMT to improve stability during functional tasks.    Time  6    Period  Weeks    Status  On-going      PT LONG TERM GOAL #3   Title  Patient will demonstrate modified 5x sit to stand in 40 seconds or less to decrease fall risk.  Time  6    Period  Weeks    Status  Achieved   in28 sec 04/09/2019           Plan - 04/16/19 1308    Clinical Impression Statement  Pt arrived today ambulating with SPC, but decreased stride length. He was guided through core and LE strengthening exs with SBA/CGA as needed.Notable LT LE weakness during seated clams red tband.    Personal Factors and Comorbidities  Age;Time since onset of injury/illness/exacerbation;Comorbidity 2    Comorbidities  HTN, COPD, history of CVA and TIAs, fall risk    Examination-Activity Limitations  Bathing;Dressing;Stand;Hygiene/Grooming;Stairs;Locomotion Level;Transfers    Stability/Clinical Decision Making  Evolving/Moderate complexity    Rehab Potential  Fair    PT Frequency  2x / week    PT Duration  6 weeks    PT Treatment/Interventions  ADLs/Self Care Home Management;Neuromuscular re-education;Balance training;Therapeutic exercise;Therapeutic activities;Functional mobility training;Stair training;Gait training;Patient/family education;Manual techniques;Passive range of motion;Manual lymph drainage    PT Next Visit Plan  Nustep, gentle strengthening of LEs, balance in sitting and  standing. Gait training    PT Home Exercise Plan  see patient education section       Patient will benefit from skilled therapeutic intervention in order to improve the following deficits and impairments:  Abnormal gait, Decreased activity tolerance, Decreased balance, Decreased strength, Difficulty walking, Decreased safety awareness  Visit Diagnosis: Unsteadiness on feet  Muscle weakness (generalized)  Repeated falls     Problem List Patient Active Problem List   Diagnosis Date Noted  . Depression, recurrent ([place]) 04/06/2018  . Hyperlipidemia, mixed 04/06/2018  . HTN (hypertension) 01/12/2015  . BPH (benign prostatic hyperplasia) 01/12/2015  . Nodule of kidney 02/14/2014  . Weakness 02/11/2014  . Recurrent colitis due to Clostridium difficile 02/11/2014  . Occlusion and stenosis of carotid artery without mention of cerebral infarction 09/07/2012  . History of CVA (cerebrovascular accident) 02/18/2012  . Tobacco abuse 02/18/2012  . Non-compliance 02/18/2012  . COPD (chronic obstructive pulmonary disease) ([place]) 02/18/2012    Warda Mcqueary,CHRIS, PTA 04/16/2019, 2:02 PM  [facility] [place], [place], [address] Phone: [phone]   Fax:  [phone]  Name: JAECE HEREDIA MRN: [id number] Date of Birth: May 12, 1950

## 2019-04-18 ENCOUNTER — Ambulatory Visit: Payer: Medicare HMO | Admitting: Physical Therapy

## 2019-04-18 ENCOUNTER — Ambulatory Visit (INDEPENDENT_AMBULATORY_CARE_PROVIDER_SITE_OTHER): Payer: Medicare HMO | Admitting: Licensed Clinical Social Worker

## 2019-04-18 ENCOUNTER — Encounter: Payer: Self-pay | Admitting: Physical Therapy

## 2019-04-18 ENCOUNTER — Other Ambulatory Visit: Payer: Self-pay

## 2019-04-18 DIAGNOSIS — E782 Mixed hyperlipidemia: Secondary | ICD-10-CM

## 2019-04-18 DIAGNOSIS — J449 Chronic obstructive pulmonary disease, unspecified: Secondary | ICD-10-CM

## 2019-04-18 DIAGNOSIS — F339 Major depressive disorder, recurrent, unspecified: Secondary | ICD-10-CM | POA: Diagnosis not present

## 2019-04-18 DIAGNOSIS — M6281 Muscle weakness (generalized): Secondary | ICD-10-CM

## 2019-04-18 DIAGNOSIS — I10 Essential (primary) hypertension: Secondary | ICD-10-CM | POA: Diagnosis not present

## 2019-04-18 DIAGNOSIS — R296 Repeated falls: Secondary | ICD-10-CM

## 2019-04-18 DIAGNOSIS — R2681 Unsteadiness on feet: Secondary | ICD-10-CM

## 2019-04-18 DIAGNOSIS — Z8673 Personal history of transient ischemic attack (TIA), and cerebral infarction without residual deficits: Secondary | ICD-10-CM

## 2019-04-18 NOTE — Therapy (Signed)
[facility] [place], [place], [address] Phone: [phone]   Fax:  [phone]  Physical Therapy Treatment  Patient Details  Name: Walter [name] MRN: [id number] Date of Birth: Feb 18, 1951 Referring Provider (PT): Caryl Pina, MD   Encounter Date: 04/18/2019  PT End of Session - 04/18/19 1438    Visit Number  8    Number of Visits  12    Date for PT Re-Evaluation  05/09/19    PT Start Time  [id number]    PT Stop Time  1513    PT Time Calculation (min)  42 min    Equipment Utilized During Treatment  Other (comment)   SPC   Activity Tolerance  Patient tolerated treatment well    Behavior During Therapy  Flat affect       Past Medical History:  Diagnosis Date  . Acute urinary retention 02/12/2014  . Brain TIA    recurrent   . C. difficile enteritis   . Carotid bruit   . COPD (chronic obstructive pulmonary disease) ([place])   . Coronary artery disease   . Myocardial infarction ([place])    incidental  . Nodule of kidney 02/14/14   solid on left  . Recurrent colitis due to Clostridium difficile 02/11/2014  . Stroke [facility]) 1997    Past Surgical History:  Procedure Laterality Date  . BACK SURGERY    . KNEE SURGERY      There were no vitals filed for this visit.  Subjective Assessment - 04/18/19 1438    Subjective  COVID 19 screening performed on patient upon arrival.  Doing ok today. Denies any pain or recent falls.    Pertinent History  History of CVA and TIAs, fall risk, HTN, COPD, speech deficits    Limitations  House hold activities;Standing;Walking    Patient Stated Goals  improve balance, less difficulties with home activities, improve movement and strength    Currently in Pain?  No/denies         [facility] PT Assessment - 04/18/19 0001      Assessment   Medical Diagnosis  History of CVA, weakness, recurrent falls    Referring Provider (PT)  Caryl Pina, MD    Onset Date/Surgical Date  08/19/17    Prior  Therapy  home health      Precautions   Precautions  Fall                   [facility] Adult PT Treatment/Exercise - 04/18/19 0001      Knee/Hip Exercises: Aerobic   Nustep  L4 x18 min      Knee/Hip Exercises: Standing   Heel Raises  Both;20 reps    Heel Raises Limitations  B toe raise x20 reps    Forward Step Up  Both;15 reps;Hand Hold: 2;Step Height: 6"    Functional Squat  10 reps;3 seconds    Other Standing Knee Exercises  Sidestepping // bars x4 RT    Other Standing Knee Exercises  DLS on airex x3 min      Knee/Hip Exercises: Seated   Long Arc Quad  Strengthening;Both;2 sets;15 reps;Weights    Long Arc Quad Weight  4 lbs.    Clamshell with Marga Hoots   x15 reps   Marching  Strengthening;Both;10 reps;Weights    Marching Limitations  4    Hamstring Curl  Strengthening;Both;20 reps    Hamstring Limitations  green theraband    Sit to Sand  20 reps;without UE support  holding 2# ball                 PT Long Term Goals - 04/09/19 1331      PT LONG TERM GOAL #1   Title  Patient will perform HEP with assistance from family.    Time  6    Period  Weeks    Status  On-going      PT LONG TERM GOAL #2   Title  Patient will demonstrate 3+/5 bilateral LE MMT to improve stability during functional tasks.    Time  6    Period  Weeks    Status  On-going      PT LONG TERM GOAL #3   Title  Patient will demonstrate modified 5x sit to stand in 40 seconds or less to decrease fall risk.    Time  6    Period  Weeks    Status  Achieved   in28 sec 04/09/2019           Plan - 04/18/19 1605    Clinical Impression Statement  Patient presented in clinic with denial of pain and any new falls. Patient guided through continued resistive strengthening with greatest weakness with ER and hip abduction. Deficiencies noted with ER/hip abduction in sitting with clam and standing with sidestepping.    Personal Factors and Comorbidities  Age;Time since onset of  injury/illness/exacerbation;Comorbidity 2    Comorbidities  HTN, COPD, history of CVA and TIAs, fall risk    Examination-Activity Limitations  Bathing;Dressing;Stand;Hygiene/Grooming;Stairs;Locomotion Level;Transfers    Stability/Clinical Decision Making  Evolving/Moderate complexity    Rehab Potential  Fair    PT Frequency  2x / week    PT Duration  6 weeks    PT Treatment/Interventions  ADLs/Self Care Home Management;Neuromuscular re-education;Balance training;Therapeutic exercise;Therapeutic activities;Functional mobility training;Stair training;Gait training;Patient/family education;Manual techniques;Passive range of motion;Manual lymph drainage    PT Next Visit Plan  Nustep, gentle strengthening of LEs, balance in sitting and standing. Gait training    PT Home Exercise Plan  see patient education section    Consulted and Agree with Plan of Care  Patient       Patient will benefit from skilled therapeutic intervention in order to improve the following deficits and impairments:  Abnormal gait, Decreased activity tolerance, Decreased balance, Decreased strength, Difficulty walking, Decreased safety awareness  Visit Diagnosis: Unsteadiness on feet  Muscle weakness (generalized)  Repeated falls     Problem List Patient Active Problem List   Diagnosis Date Noted  . Depression, recurrent ([place]) 04/06/2018  . Hyperlipidemia, mixed 04/06/2018  . HTN (hypertension) 01/12/2015  . BPH (benign prostatic hyperplasia) 01/12/2015  . Nodule of kidney 02/14/2014  . Weakness 02/11/2014  . Recurrent colitis due to Clostridium difficile 02/11/2014  . Occlusion and stenosis of carotid artery without mention of cerebral infarction 09/07/2012  . History of CVA (cerebrovascular accident) 02/18/2012  . Tobacco abuse 02/18/2012  . Non-compliance 02/18/2012  . COPD (chronic obstructive pulmonary disease) ([place]) 02/18/2012    Standley Brooking, PTA 04/18/2019, 4:46 PM  [facility] [address] [place], [place], [address] Phone: [phone]   Fax:  [phone]  Name: OSBORN HOGLEN MRN: [id number] Date of Birth: Oct 03, 1950

## 2019-04-18 NOTE — Patient Instructions (Addendum)
Licensed Clinical Social Worker Visit Information  Goals we discussed today:  Goals Addressed            This Visit's Progress   . "Client said he wanted to talk to someone about depression and managing depression" (pt-stated)       Current Barriers:   [name] Mental Health Challenges in patient with Chronic Diagnoses of Depression, Hyperlipidemia, COPD , HTN, and Hx of CVA . Difficulty walking (uses cane to walk) . Fatigues easily . Hygiene challenges . Smokes daily  Clinical Social Work Clinical Goal(s):  [name] Over the next 30 days, client will work with LCSW to address concerns related to depression and mangement of depression  symptoms of client  Interventions:   Previously encouraged that client socialize with family or friends as a way to help manage depression symptoms     Previously talked with spouse of client about smoking of client   Previously LCSW talked with client/spouse of client about managing depression symptoms of client   Previously encouraged client or spouse of client to talk with RN CM regarding nursing needs of client  Encouraged client previously to use relaxation techniques to help manage depression symptoms (watch TV, walking outside, listen to music,visit with sister)   Previously talked with client about client's completion of ADLs  Talked previously with [name] about ambulation challenges of client . Talked previously with [name] about client's upcoming physical therapy appointment  Patient Self Care Activities:  . Attends scheduled provider appointments . Takes medications as prescribed . Drives self to appointments  Plan:  LCSW to communicate with client/spouse in next 4 weeks to discuss management of depression symptoms of client  Client/spouse to call LCSW as needed to discuss depression issues of client  Client/spouse to talk with RN CM [name] to discuss nursing needs of client  Client to use relaxation techniques  to help manage depression symptoms. (listen to music, walk outdoors, watch TV)  Talked with client about client completion of ADLs.  Initial goal documentation     Materials Provided: No  Follow Up Plan: LCSW to call client/spouse of client in next 4 weeks to discuss client management of depression symptoms  The patient/Walter [name] verbalized understanding of instructions provided today and declined a print copy of patient instruction materials.   [name].Chynah Orihuela MSW, LCSW Licensed Clinical Social Worker [facility] Care [facility] [phone]

## 2019-04-18 NOTE — Chronic Care Management (AMB) (Signed)
  Care Management Note   Walter [name] is a 69 y.o. year old male who is a primary care patient of Dettinger, Fransisca Kaufmann, MD. The CM team was consulted for assistance with chronic disease management and care coordination.   I reached out to [employer], by phone today.    Review of patient status, including review of consultants reports, relevant laboratory and other test results, and collaboration with appropriate care team members and the patient's provider was performed as part of comprehensive patient evaluation and provision of chronic care management services.   Social determinants of health:risk of tobacco use; risk of depression    Chronic Care Management from 05/08/2018 in [facility]  PHQ-9 Total Score  9     Medications   amLODipine (NORVASC) 5 MG tablet atorvastatin (LIPITOR) 40 MG tablet buPROPion (WELLBUTRIN XL) 300 MG 24 hr tablet clopidogrel (PLAVIX) 75 MG tablet terbinafine (LAMISIL) 250 MG tablet  Goals Addressed            This Visit's Progress   . "Client said he wanted to talk to someone about depression and managing depression" (pt-stated)       Current Barriers:   [name] Mental Health Challenges in patient with Chronic Diagnoses of Depression, Hyperlipidemia, COPD , HTN, and Hx of CVA . Difficulty walking (uses cane to walk) . Fatigues easily . Hygiene challenges . Smokes daily  Clinical Social Work Clinical Goal(s):  [name] Over the next 30 days, client will work with LCSW to address concerns related to depression and mangement of depression  symptoms of client  Interventions: . Previously encouraged that client socialize with family or friends as a way to help manage depression symptoms   .  Previously talked with spouse of client about smoking of client   Previously LCSW talked with client/spouse of client about managing depression symptoms of client   Previously encouraged client or spouse of client to talk with  RN CM regarding nursing needs of client  Encouraged client previously to use relaxation techniques to help manage depression symptoms (watch TV, walking outside, listen to music,visit with sister)     Previously talked with client about client's completion of ADLs      Talked previously with [name] about ambulation challenges of client  Talked previously with [name] about client's upcoming physical therapy appointment  Patient Self Care Activities:  . Attends scheduled provider appointments . Takes medications as prescribed . Drives self to appointments  Plan:  LCSW to communicate with client/spouse in next 4 weeks to discuss management of depression symptoms of client  Client/spouse to call LCSW as needed to discuss depression issues of client  Client/spouse to talk with RN CM [name] to discuss nursing needs of client  Client to use relaxation techniques to help manage depression symptoms. (listen to music, walk outdoors, watch TV)  Talked with client about client completion of ADLs.  Initial goal documentation     Follow Up Plan: LCSW to call client/spouse of client in next 4 weeks to talk with client/spouse about client management of depression symptoms  [name].Kadesia Robel MSW, LCSW Licensed Clinical Social Worker [facility] [phone]

## 2019-04-19 ENCOUNTER — Other Ambulatory Visit: Payer: Self-pay | Admitting: *Deleted

## 2019-04-19 MED ORDER — BUPROPION HCL ER (XL) 300 MG PO TB24: 300.0000 mg | ORAL_TABLET | Freq: Every day | ORAL | 1 refills | Status: DC

## 2019-04-23 ENCOUNTER — Ambulatory Visit: Payer: Medicare HMO | Admitting: Physical Therapy

## 2019-04-23 ENCOUNTER — Other Ambulatory Visit: Payer: Self-pay

## 2019-04-23 ENCOUNTER — Encounter: Payer: Self-pay | Admitting: Physical Therapy

## 2019-04-23 DIAGNOSIS — M6281 Muscle weakness (generalized): Secondary | ICD-10-CM

## 2019-04-23 DIAGNOSIS — R2681 Unsteadiness on feet: Secondary | ICD-10-CM | POA: Diagnosis not present

## 2019-04-23 DIAGNOSIS — R296 Repeated falls: Secondary | ICD-10-CM | POA: Diagnosis not present

## 2019-04-23 NOTE — Therapy (Signed)
[facility] [place], [place], [address] Phone: [phone]   Fax:  [phone]  Physical Therapy Treatment  Patient Details  Name: Walter [name] MRN: [id number] Date of Birth: 03-02-1951 Referring Provider (PT): Caryl Pina, MD   Encounter Date: 04/23/2019  PT End of Session - 04/23/19 1426    Visit Number  9    Number of Visits  12    Date for PT Re-Evaluation  05/09/19    PT Start Time  [id number]    PT Stop Time  1512    PT Time Calculation (min)  40 min    Equipment Utilized During Treatment  Other (comment)   SPC   Activity Tolerance  Patient tolerated treatment well    Behavior During Therapy  Flat affect       Past Medical History:  Diagnosis Date  . Acute urinary retention 02/12/2014  . Brain TIA    recurrent   . C. difficile enteritis   . Carotid bruit   . COPD (chronic obstructive pulmonary disease) ([place])   . Coronary artery disease   . Myocardial infarction ([place])    incidental  . Nodule of kidney 02/14/14   solid on left  . Recurrent colitis due to Clostridium difficile 02/11/2014  . Stroke [facility]) 1997    Past Surgical History:  Procedure Laterality Date  . BACK SURGERY    . KNEE SURGERY      There were no vitals filed for this visit.  Subjective Assessment - 04/23/19 1426    Subjective  COVID 19 screening performed on patient upon arrival. Denies any pain or recent falls.    Pertinent History  History of CVA and TIAs, fall risk, HTN, COPD, speech deficits    Limitations  House hold activities;Standing;Walking    Patient Stated Goals  improve balance, less difficulties with home activities, improve movement and strength    Currently in Pain?  No/denies         [facility] PT Assessment - 04/23/19 0001      Assessment   Medical Diagnosis  History of CVA, weakness, recurrent falls    Referring Provider (PT)  Caryl Pina, MD    Onset Date/Surgical Date  08/19/17    Prior Therapy  home  health      Precautions   Precautions  [facility] Adult PT Treatment/Exercise - 04/23/19 0001      Lumbar Exercises: Seated   Long Arc Quad on Chair  AROM;Both;15 reps   with shoulder hor. abd for core activation red theraband   Hip Flexion on Ball  AROM;Both;15 reps   wiht shoulder horizontal abduction for core activation red      Knee/Hip Exercises: Aerobic   Nustep  L4 x18 min      Knee/Hip Exercises: Seated   Long Arc Quad  Strengthening;Both;2 sets;15 reps;Weights    Long Arc Quad Weight  4 lbs.    Other Seated Knee/Hip Exercises  B heel/toe raises x20 rpes    Other Seated Knee/Hip Exercises  B ankle inversion/eversion green theraband x20 reps    Marching  Strengthening;Both;2 sets;10 reps;Weights    Marching Limitations  4    Hamstring Curl  Strengthening;Both;20 reps    Hamstring Limitations  red theraband    Abduction/Adduction   Strengthening;Both;2 sets;10 reps;Limitations    Abd/Adduction Limitations  isolated for BLE; red theraband  PT Long Term Goals - 04/09/19 1331      PT LONG TERM GOAL #1   Title  Patient will perform HEP with assistance from family.    Time  6    Period  Weeks    Status  On-going      PT LONG TERM GOAL #2   Title  Patient will demonstrate 3+/5 bilateral LE MMT to improve stability during functional tasks.    Time  6    Period  Weeks    Status  On-going      PT LONG TERM GOAL #3   Title  Patient will demonstrate modified 5x sit to stand in 40 seconds or less to decrease fall risk.    Time  6    Period  Weeks    Status  Achieved   in28 sec 04/09/2019           Plan - 04/23/19 1656    Clinical Impression Statement  Patient presented in clinic with use of SPC but continues to ambulate with short stride lengths. Patient progressed to more core strengthening with weakness noted by trunk extension in sitting. More isolated reps for hip abduction and adduction to improve  technique executed.    Personal Factors and Comorbidities  Age;Time since onset of injury/illness/exacerbation;Comorbidity 2    Comorbidities  HTN, COPD, history of CVA and TIAs, fall risk    Examination-Activity Limitations  Bathing;Dressing;Stand;Hygiene/Grooming;Stairs;Locomotion Level;Transfers    Stability/Clinical Decision Making  Evolving/Moderate complexity    Rehab Potential  Fair    PT Frequency  2x / week    PT Duration  6 weeks    PT Treatment/Interventions  ADLs/Self Care Home Management;Neuromuscular re-education;Balance training;Therapeutic exercise;Therapeutic activities;Functional mobility training;Stair training;Gait training;Patient/family education;Manual techniques;Passive range of motion;Manual lymph drainage    PT Next Visit Plan  Nustep, gentle strengthening of LEs, balance in sitting and standing. Gait training    PT Home Exercise Plan  see patient education section    Consulted and Agree with Plan of Care  Patient       Patient will benefit from skilled therapeutic intervention in order to improve the following deficits and impairments:  Abnormal gait, Decreased activity tolerance, Decreased balance, Decreased strength, Difficulty walking, Decreased safety awareness  Visit Diagnosis: Unsteadiness on feet  Muscle weakness (generalized)  Repeated falls     Problem List Patient Active Problem List   Diagnosis Date Noted  . Depression, recurrent ([place]) 04/06/2018  . Hyperlipidemia, mixed 04/06/2018  . HTN (hypertension) 01/12/2015  . BPH (benign prostatic hyperplasia) 01/12/2015  . Nodule of kidney 02/14/2014  . Weakness 02/11/2014  . Recurrent colitis due to Clostridium difficile 02/11/2014  . Occlusion and stenosis of carotid artery without mention of cerebral infarction 09/07/2012  . History of CVA (cerebrovascular accident) 02/18/2012  . Tobacco abuse 02/18/2012  . Non-compliance 02/18/2012  . COPD (chronic obstructive pulmonary disease) ([place])  02/18/2012    Standley Brooking, PTA 04/23/2019, 5:04 PM  [facility] [address] [place], [place], [address] Phone: [phone]   Fax:  [phone]  Name: Walter [name] MRN: [id number] Date of Birth: May 15, 1950

## 2019-04-25 ENCOUNTER — Encounter: Payer: Medicare HMO | Admitting: Physical Therapy

## 2019-04-26 ENCOUNTER — Other Ambulatory Visit: Payer: Self-pay

## 2019-04-26 ENCOUNTER — Ambulatory Visit: Payer: Medicare HMO | Admitting: *Deleted

## 2019-04-26 DIAGNOSIS — M6281 Muscle weakness (generalized): Secondary | ICD-10-CM | POA: Diagnosis not present

## 2019-04-26 DIAGNOSIS — R296 Repeated falls: Secondary | ICD-10-CM | POA: Diagnosis not present

## 2019-04-26 DIAGNOSIS — R2681 Unsteadiness on feet: Secondary | ICD-10-CM | POA: Diagnosis not present

## 2019-04-26 NOTE — Therapy (Signed)
[facility] [place], [place], [address] Phone: [phone]   Fax:  [phone]  Physical Therapy Treatment  Patient Details  Name: Walter [name] MRN: [id number] Date of Birth: 1950/08/08 Referring Provider (PT): Caryl Pina, MD   Encounter Date: 04/26/2019  PT End of Session - 04/26/19 1209    Visit Number  10    Number of Visits  12    Date for PT Re-Evaluation  05/09/19    PT Start Time  1115    PT Stop Time  [id number]    PT Time Calculation (min)  50 min       Past Medical History:  Diagnosis Date  . Acute urinary retention 02/12/2014  . Brain TIA    recurrent   . C. difficile enteritis   . Carotid bruit   . COPD (chronic obstructive pulmonary disease) ([place])   . Coronary artery disease   . Myocardial infarction ([place])    incidental  . Nodule of kidney 02/14/14   solid on left  . Recurrent colitis due to Clostridium difficile 02/11/2014  . Stroke [facility]) 1997    Past Surgical History:  Procedure Laterality Date  . BACK SURGERY    . KNEE SURGERY      There were no vitals filed for this visit.  Subjective Assessment - 04/26/19 1130    Subjective  COVID 19 screening performed on patient upon arrival. Denies any pain or recent falls. Doing ok    Pertinent History  History of CVA and TIAs, fall risk, HTN, COPD, speech deficits    Limitations  House hold activities;Standing;Walking    Patient Stated Goals  improve balance, less difficulties with home activities, improve movement and strength    Currently in Pain?  No/denies                       [facility] Adult PT Treatment/Exercise - 04/26/19 0001      Knee/Hip Exercises: Standing   Knee Flexion  AROM;1 set;20 reps    Hip Abduction  Both;10 reps;3 sets    Forward Step Up  Both;15 reps;Hand Hold: 2;Step Height: 6"   Toe taps x10 for each LE no UE assist   Rocker Board  3 minutes    Other Standing Knee Exercises  Sidestepping // bars x4 RT  SBA/CGA    Other Standing Knee Exercises  green/ blue pods used for Rt and LT lateral and forward toe touching      Knee/Hip Exercises: Seated   Long Arc Quad  Strengthening;Both;Weights;3 sets;10 reps   pause at top 3-4 secs   Long Arc Quad Weight  4 lbs.    Ball Squeeze  x10 5 sec holds reps    Sit to [employer]  20 reps;10 reps;2 sets;with UE support                  PT Long Term Goals - 04/09/19 1331      PT LONG TERM GOAL #1   Title  Patient will perform HEP with assistance from family.    Time  6    Period  Weeks    Status  On-going      PT LONG TERM GOAL #2   Title  Patient will demonstrate 3+/5 bilateral LE MMT to improve stability during functional tasks.    Time  6    Period  Weeks    Status  On-going      PT LONG TERM GOAL #  3   Title  Patient will demonstrate modified 5x sit to stand in 40 seconds or less to decrease fall risk.    Time  6    Period  Weeks    Status  Achieved   in28 sec 04/09/2019           Plan - 04/26/19 1131    Clinical Impression Statement  Pt arrived today doing fairly well. Rx focused on balance and LE strengthening. Pt showed improved gait cycle today with increased stride length without cues.    Personal Factors and Comorbidities  Age;Time since onset of injury/illness/exacerbation;Comorbidity 2    Comorbidities  HTN, COPD, history of CVA and TIAs, fall risk    Examination-Activity Limitations  Bathing;Dressing;Stand;Hygiene/Grooming;Stairs;Locomotion Level;Transfers    Stability/Clinical Decision Making  Evolving/Moderate complexity    Rehab Potential  Fair    PT Frequency  2x / week    PT Duration  6 weeks    PT Treatment/Interventions  ADLs/Self Care Home Management;Neuromuscular re-education;Balance training;Therapeutic exercise;Therapeutic activities;Functional mobility training;Stair training;Gait training;Patient/family education;Manual techniques;Passive range of motion;Manual lymph drainage    PT Next Visit Plan  Nustep,  gentle strengthening of LEs, balance in sitting and standing. Gait training    PT Home Exercise Plan  see patient education section    Consulted and Agree with Plan of Care  Patient       Patient will benefit from skilled therapeutic intervention in order to improve the following deficits and impairments:  Abnormal gait, Decreased activity tolerance, Decreased balance, Decreased strength, Difficulty walking, Decreased safety awareness  Visit Diagnosis: Unsteadiness on feet  Muscle weakness (generalized)  Repeated falls     Problem List Patient Active Problem List   Diagnosis Date Noted  . Depression, recurrent ([place]) 04/06/2018  . Hyperlipidemia, mixed 04/06/2018  . HTN (hypertension) 01/12/2015  . BPH (benign prostatic hyperplasia) 01/12/2015  . Nodule of kidney 02/14/2014  . Weakness 02/11/2014  . Recurrent colitis due to Clostridium difficile 02/11/2014  . Occlusion and stenosis of carotid artery without mention of cerebral infarction 09/07/2012  . History of CVA (cerebrovascular accident) 02/18/2012  . Tobacco abuse 02/18/2012  . Non-compliance 02/18/2012  . COPD (chronic obstructive pulmonary disease) ([place]) 02/18/2012    Myrene Bougher,CHRIS, PTA 04/26/2019, 12:24 PM  [facility] [address] [place], [place], [address] Phone: [phone]   Fax:  [phone]  Name: Walter [name] MRN: [id number] Date of Birth: [birth year]

## 2019-04-29 ENCOUNTER — Encounter: Payer: Self-pay | Admitting: Physical Therapy

## 2019-04-29 ENCOUNTER — Other Ambulatory Visit: Payer: Self-pay

## 2019-04-29 ENCOUNTER — Ambulatory Visit: Payer: Medicare HMO | Admitting: Physical Therapy

## 2019-04-29 DIAGNOSIS — R296 Repeated falls: Secondary | ICD-10-CM

## 2019-04-29 DIAGNOSIS — R2681 Unsteadiness on feet: Secondary | ICD-10-CM

## 2019-04-29 DIAGNOSIS — M6281 Muscle weakness (generalized): Secondary | ICD-10-CM

## 2019-04-29 NOTE — Therapy (Signed)
[facility] [place], [place], [address] Phone: [phone]   Fax:  [phone]  Physical Therapy Treatment  Patient Details  Name: Walter [name] MRN: [id number] Date of Birth: [birth year] Referring Provider (PT): Caryl Pina, MD   Encounter Date: 04/29/2019  PT End of Session - 04/29/19 1405    Visit Number  11    Number of Visits  12    Date for PT Re-Evaluation  05/09/19    PT Start Time  [id number]    PT Stop Time  1522    PT Time Calculation (min)  35 min    Activity Tolerance  Patient limited by fatigue    Behavior During Therapy  Flat affect       Past Medical History:  Diagnosis Date  . Acute urinary retention 02/12/2014  . Brain TIA    recurrent   . C. difficile enteritis   . Carotid bruit   . COPD (chronic obstructive pulmonary disease) ([place])   . Coronary artery disease   . Myocardial infarction ([place])    incidental  . Nodule of kidney 02/14/14   solid on left  . Recurrent colitis due to Clostridium difficile 02/11/2014  . Stroke [facility]) 1997    Past Surgical History:  Procedure Laterality Date  . BACK SURGERY    . KNEE SURGERY      There were no vitals filed for this visit.  Subjective Assessment - 04/29/19 1405    Subjective  COVID 19 screening performed on patient upon arrival. Denies any pain or recent falls. Doing ok    Pertinent History  History of CVA and TIAs, fall risk, HTN, COPD, speech deficits    Limitations  House hold activities;Standing;Walking    Patient Stated Goals  improve balance, less difficulties with home activities, improve movement and strength    Currently in Pain?  No/denies         [facility] PT Assessment - 04/29/19 0001      Assessment   Medical Diagnosis  History of CVA, weakness, recurrent falls    Referring Provider (PT)  Caryl Pina, MD    Onset Date/Surgical Date  08/19/17    Prior Therapy  home health      Precautions   Precautions  Fall      ROM /  Strength   AROM / PROM / Strength  Strength      Strength   Overall Strength  Within functional limits for tasks performed    Strength Assessment Site  Hip;Knee    Right/Left Hip  Right;Left    Right Hip Flexion  4/5    Right Hip External Rotation   4/5    Left Hip Flexion  4/5    Left Hip External Rotation  4-/5    Right/Left Knee  Right;Left    Right Knee Flexion  4-/5    Right Knee Extension  4/5    Left Knee Flexion  4-/5    Left Knee Extension  4/5                   [facility] Adult PT Treatment/Exercise - 04/29/19 0001      Knee/Hip Exercises: Aerobic   Nustep  L4 x19 min      Knee/Hip Exercises: Standing   Hip Flexion  AROM;Both;20 reps;Knee bent    Hip Abduction  AROM;Both;20 reps;Knee straight      Knee/Hip Exercises: Seated   Long Arc Quad  Strengthening;Both;2 sets;10 reps;Weights  Long Arc Quad Weight  4 lbs.    Clamshell with TheraBand  Red   x20 reps   Marching  Strengthening;Both;2 sets;10 reps;Limitations    Marching Limitations  red theraband    Hamstring Curl  Strengthening;Both;20 reps    Hamstring Limitations  red theraband    Sit to Sand  20 reps;with UE support                  PT Long Term Goals - 04/29/19 1425      PT LONG TERM GOAL #1   Title  Patient will perform HEP with assistance from family.    Time  6    Period  Weeks    Status  On-going      PT LONG TERM GOAL #2   Title  Patient will demonstrate 3+/5 bilateral LE MMT to improve stability during functional tasks.    Time  6    Period  Weeks    Status  Achieved      PT LONG TERM GOAL #3   Title  Patient will demonstrate modified 5x sit to stand in 40 seconds or less to decrease fall risk.    Time  6    Period  Weeks    Status  Achieved   in28 sec 04/09/2019           Plan - 04/29/19 1430    Clinical Impression Statement  Patient presented in clinic with no pain or recent fall reports. Patient's LE strength has greatly improved and has been able to  meet two out of three LTGs. Patient fatigued by seated therex and MMT testing thus limiting standing therex. Improved stability noted during gait but ataxic tendencies still noted during gait with SPC.    Personal Factors and Comorbidities  Age;Time since onset of injury/illness/exacerbation;Comorbidity 2    Comorbidities  HTN, COPD, history of CVA and TIAs, fall risk    Examination-Activity Limitations  Bathing;Dressing;Stand;Hygiene/Grooming;Stairs;Locomotion Level;Transfers    Stability/Clinical Decision Making  Evolving/Moderate complexity    Rehab Potential  Fair    PT Frequency  2x / week    PT Duration  6 weeks    PT Treatment/Interventions  ADLs/Self Care Home Management;Neuromuscular re-education;Balance training;Therapeutic exercise;Therapeutic activities;Functional mobility training;Stair training;Gait training;Patient/family education;Manual techniques;Passive range of motion;Manual lymph drainage    PT Next Visit Plan  Nustep, gentle strengthening of LEs, balance in sitting and standing. Gait training    PT Home Exercise Plan  see patient education section    Consulted and Agree with Plan of Care  Patient       Patient will benefit from skilled therapeutic intervention in order to improve the following deficits and impairments:  Abnormal gait, Decreased activity tolerance, Decreased balance, Decreased strength, Difficulty walking, Decreased safety awareness  Visit Diagnosis: Unsteadiness on feet  Muscle weakness (generalized)  Repeated falls     Problem List Patient Active Problem List   Diagnosis Date Noted  . Depression, recurrent ([place]) 04/06/2018  . Hyperlipidemia, mixed 04/06/2018  . HTN (hypertension) 01/12/2015  . BPH (benign prostatic hyperplasia) 01/12/2015  . Nodule of kidney 02/14/2014  . Weakness 02/11/2014  . Recurrent colitis due to Clostridium difficile 02/11/2014  . Occlusion and stenosis of carotid artery without mention of cerebral infarction  09/07/2012  . History of CVA (cerebrovascular accident) 02/18/2012  . Tobacco abuse 02/18/2012  . Non-compliance 02/18/2012  . COPD (chronic obstructive pulmonary disease) ([place]) 02/18/2012    Standley Brooking, PTA 04/29/2019, 3:27 PM  [facility] [place], [place], [address] Phone: [phone]   Fax:  [phone]  Name: ELIASON MEMBRENO MRN: [id number] Date of Birth: 07-25-1950

## 2019-04-30 ENCOUNTER — Encounter: Payer: Self-pay | Admitting: Physical Therapy

## 2019-04-30 ENCOUNTER — Ambulatory Visit: Payer: Medicare HMO | Admitting: Physical Therapy

## 2019-04-30 DIAGNOSIS — R296 Repeated falls: Secondary | ICD-10-CM

## 2019-04-30 DIAGNOSIS — R2681 Unsteadiness on feet: Secondary | ICD-10-CM

## 2019-04-30 DIAGNOSIS — M6281 Muscle weakness (generalized): Secondary | ICD-10-CM | POA: Diagnosis not present

## 2019-04-30 NOTE — Therapy (Signed)
[facility] [place], [place], [address] Phone: [phone]   Fax:  [phone]  Physical Therapy Treatment PHYSICAL THERAPY DISCHARGE SUMMARY  Visits from Start of Care: 12  Current functional level related to goals / functional outcomes: See below   Remaining deficits: See goals   Education / Equipment: HEP Plan: Patient agrees to discharge.  Patient goals were partially met. Patient is being discharged due to being pleased with the current functional level.  ?????   Gabriela Eves, PT, DPT    Patient Details  Name: Walter [name] MRN: [id number] Date of Birth: 1950-07-26 Referring Provider (PT): Caryl Pina, MD   Encounter Date: 04/30/2019  PT End of Session - 04/30/19 1358    Visit Number  12    Number of Visits  12    Date for PT Re-Evaluation  05/09/19    PT Start Time  1351    PT Stop Time  1430    PT Time Calculation (min)  39 min    Equipment Utilized During Treatment  Other (comment)   [place]   Activity Tolerance  Patient limited by fatigue    Behavior During Therapy  Flat affect       Past Medical History:  Diagnosis Date  . Acute urinary retention 02/12/2014  . Brain TIA    recurrent   . C. difficile enteritis   . Carotid bruit   . COPD (chronic obstructive pulmonary disease) ([place])   . Coronary artery disease   . Myocardial infarction ([place])    incidental  . Nodule of kidney 02/14/14   solid on left  . Recurrent colitis due to Clostridium difficile 02/11/2014  . Stroke [facility]) 1997    Past Surgical History:  Procedure Laterality Date  . BACK SURGERY    . KNEE SURGERY      There were no vitals filed for this visit.  Subjective Assessment - 04/30/19 1358    Subjective  COVID 19 screening performed on patient upon arrival. Denies any pain or recent falls. Doing ok    Pertinent History  History of CVA and TIAs, fall risk, HTN, COPD, speech deficits    Limitations  House hold  activities;Standing;Walking    Patient Stated Goals  improve balance, less difficulties with home activities, improve movement and strength    Currently in Pain?  No/denies         [facility] PT Assessment - 04/30/19 0001      Assessment   Medical Diagnosis  History of CVA, weakness, recurrent falls    Referring Provider (PT)  Caryl Pina, MD    Onset Date/Surgical Date  08/19/17    Prior Therapy  home health      Precautions   Precautions  Fall                   [facility] Adult PT Treatment/Exercise - 04/30/19 0001      Knee/Hip Exercises: Aerobic   Nustep  L4 x19 min      Knee/Hip Exercises: Standing   Heel Raises  Both;20 reps    Heel Raises Limitations  B toe raise x20 reps    Hip Flexion  AROM;Both;20 reps;Knee bent    Hip Abduction  AROM;Both;20 reps;Knee straight    Forward Step Up  Both;2 sets;10 reps;Hand Hold: 2;Step Height: 6"    Functional Squat  20 reps    Functional Squat Limitations  mini squat      Knee/Hip Exercises: Seated   Long  [employer]  Strengthening;Both;2 sets;10 reps;Weights    Long [employer]  4 lbs.    Clamshell with TheraBand  Red   x20 reps   Marching  Strengthening;Both;2 sets;10 reps;Limitations    Marching Limitations  crossed arm postur    Hamstring Curl  Strengthening;Both;20 reps    Hamstring Limitations  red theraband    Sit to Sand  20 reps;without UE support                  PT Long Term Goals - 04/30/19 1420      PT LONG TERM GOAL #1   Title  Patient will perform HEP with assistance from family.    Time  6    Period  Weeks    Status  Not Met      PT LONG TERM GOAL #2   Title  Patient will demonstrate 3+/5 bilateral LE MMT to improve stability during functional tasks.    Time  6    Period  Weeks    Status  Achieved      PT LONG TERM GOAL #3   Title  Patient will demonstrate modified 5x sit to stand in 40 seconds or less to decrease fall risk.    Time  6    Period  Weeks    Status  Achieved    in28 sec 04/09/2019           Plan - 04/30/19 1511    Clinical Impression Statement  Patient presented in clinic with achievement of all LTGs except HEP which patient reported he does not do at home. Observations during patient's gait has been of more enviromental deception or distraction. Patient uses SPC during ambulation.    Personal Factors and Comorbidities  Age;Time since onset of injury/illness/exacerbation;Comorbidity 2    Comorbidities  HTN, COPD, history of CVA and TIAs, fall risk    Examination-Activity Limitations  Bathing;Dressing;Stand;Hygiene/Grooming;Stairs;Locomotion Level;Transfers    Stability/Clinical Decision Making  Evolving/Moderate complexity    Rehab Potential  Fair    PT Frequency  2x / week    PT Duration  6 weeks    PT Treatment/Interventions  ADLs/Self Care Home Management;Neuromuscular re-education;Balance training;Therapeutic exercise;Therapeutic activities;Functional mobility training;Stair training;Gait training;Patient/family education;Manual techniques;Passive range of motion;Manual lymph drainage    PT Next Visit Plan  Nustep, gentle strengthening of LEs, balance in sitting and standing. Gait training    PT Home Exercise Plan  see patient education section    Consulted and Agree with Plan of Care  Patient       Patient will benefit from skilled therapeutic intervention in order to improve the following deficits and impairments:  Abnormal gait, Decreased activity tolerance, Decreased balance, Decreased strength, Difficulty walking, Decreased safety awareness  Visit Diagnosis: Unsteadiness on feet  Muscle weakness (generalized)  Repeated falls     Problem List Patient Active Problem List   Diagnosis Date Noted  . Depression, recurrent ([place]) 04/06/2018  . Hyperlipidemia, mixed 04/06/2018  . HTN (hypertension) 01/12/2015  . BPH (benign prostatic hyperplasia) 01/12/2015  . Nodule of kidney 02/14/2014  . Weakness 02/11/2014  . Recurrent  colitis due to Clostridium difficile 02/11/2014  . Occlusion and stenosis of carotid artery without mention of cerebral infarction 09/07/2012  . History of CVA (cerebrovascular accident) 02/18/2012  . Tobacco abuse 02/18/2012  . Non-compliance 02/18/2012  . COPD (chronic obstructive pulmonary disease) ([place]) 02/18/2012    Standley Brooking, PTA 04/30/19 3:26 PM   [facility] [place]  [place], [place], [address] Phone: [phone]   Fax:  [phone]  Name: Walter [name] MRN: [id number] Date of Birth: 1951/04/02

## 2019-05-15 ENCOUNTER — Ambulatory Visit: Payer: Medicare HMO | Admitting: *Deleted

## 2019-05-15 ENCOUNTER — Ambulatory Visit (INDEPENDENT_AMBULATORY_CARE_PROVIDER_SITE_OTHER): Payer: Medicare HMO | Admitting: Licensed Clinical Social Worker

## 2019-05-15 DIAGNOSIS — Z8673 Personal history of transient ischemic attack (TIA), and cerebral infarction without residual deficits: Secondary | ICD-10-CM

## 2019-05-15 DIAGNOSIS — J449 Chronic obstructive pulmonary disease, unspecified: Secondary | ICD-10-CM | POA: Diagnosis not present

## 2019-05-15 DIAGNOSIS — I10 Essential (primary) hypertension: Secondary | ICD-10-CM

## 2019-05-15 DIAGNOSIS — E782 Mixed hyperlipidemia: Secondary | ICD-10-CM | POA: Diagnosis not present

## 2019-05-15 DIAGNOSIS — F339 Major depressive disorder, recurrent, unspecified: Secondary | ICD-10-CM

## 2019-05-15 NOTE — Chronic Care Management (AMB) (Signed)
  Care Management Note   Walter [name] is a 69 y.o. year old male who is a primary care patient of Dettinger, Fransisca Kaufmann, MD. The CM team was consulted for assistance with chronic disease management and care coordination.   I reached out to [employer], spouse, by phone today.   Review of patient status, including review of consultants reports, relevant laboratory and other test results, and collaboration with appropriate care team members and the patient's provider was performed as part of comprehensive patient evaluation and provision of chronic care management services.   Social determinants of health: risk of tobacco use; risk of depression    Chronic Care Management from 05/08/2018 in [facility]  PHQ-9 Total Score  9     Medications   amLODipine (NORVASC) 5 MG tablet atorvastatin (LIPITOR) 40 MG tablet buPROPion (WELLBUTRIN XL) 300 MG 24 hr tablet clopidogrel (PLAVIX) 75 MG tablet terbinafine (LAMISIL) 250 MG tablet  Goals    . "Client said he wanted to talk to someone about depression and managing depression" (pt-stated)     Current Barriers:   [name] Mental Health Challenges in patient with Chronic Diagnoses of Depression, Hyperlipidemia, COPD , HTN, and Hx of CVA . Difficulty walking (uses cane to walk) . Fatigues easily . Hygiene challenges . Smokes daily  Clinical Social Work Clinical Goal(s):  [name] Over the next 30 days, client will work with LCSW to address concerns related to depression and mangement of depression  symptoms of client  Interventions:  Previously encouraged that client socialize with family or friends as a way to help manage depression symptoms    Talked with spouse of client about smoking of client   Previously LCSW talked with client/spouse of client about managing depression symptoms of client   Previously encouraged client or spouse of client to talk with RN CM regarding nursing needs of client  Encouraged client  previously to use relaxation techniques to help manage depression symptoms (watch TV, walking outside, listen to music,visit with sister)   Talked with [name] about client completion of ADLs  Talked with [name] about ambulation challenges of client . Talked with [name] about client's physical therapy appointments which he recently has completed . Collaborated with RNCM regarding possible aspiration issues of client Renier Chaddock said client sometimes chokes a little when eating meals and has to cough because of some choking. She said this happens almost daily)  Patient Self Care Activities:  . Attends scheduled provider appointments . Takes medications as prescribed . Drives self to appointments  Plan:  LCSW to communicate with client/spouse in next 3 weeks to discuss management of depression symptoms of client  Client/spouse to call LCSW as needed to discuss depression issues of client  Client/spouse to talk with RN CM [name] to discuss nursing needs of client  Client to use relaxation techniques to help manage depression symptoms. (listen to music, walk outdoors, watch TV)  Talked with client about client completion of ADLs.  Initial goal documentation    Follow Up Plan: LCSW to call client or spouse of client in next 4 weeks to discuss management of depression symptoms of client  [name].Delaynee Alred MSW, LCSW Licensed Clinical Social Worker [facility] Care Management [phone]

## 2019-05-15 NOTE — Patient Instructions (Addendum)
Licensed Clinical Social Worker Visit Information  Goals we discussed today:  Goals    . "Client said he wanted to talk to someone about depression and managing depression" (pt-stated)     Current Barriers:   [name] Mental Health Challenges in patient with Chronic Diagnoses of Depression, Hyperlipidemia, COPD , HTN, and Hx of CVA . Difficulty walking (uses cane to walk) . Fatigues easily . Hygiene challenges . Smokes daily  Clinical Social Work Clinical Goal(s):  [name] Over the next 30 days, client will work with LCSW to address concerns related to depression and mangement of depression  symptoms of client  Interventions:  Previously encouraged that client socialize with family or friends as a way to help manage depression symptoms  Talked with spouse of client about smoking of client  Previously LCSW talked with client/spouse of client about managing depression symptoms of client  Previously encouraged client or spouse of client to talk with RN CM regarding nursing needs of client  Encouraged client previously to use relaxation techniques to help manage depression symptoms (watch TV, walking outside, listen to music,visit with sister)  Talked with [name] about client completion of ADLs  Talked with [name] about ambulation challenges of client Talked with [name] about client's physical therapy appointments which he recently has completed  Collaborated with RNCM regarding possible aspiration issues of client Celestine Laviola said client sometimes chokes a little when eating meals and has to cough because of some choking. She said this happens almost daily)  Patient Self Care Activities:  . Attends scheduled provider appointments . Takes medications as prescribed . Drives self to appointments  Plan:  LCSW to communicate with client/spouse in next 4 weeks to discuss management of depression symptoms of client  Client/spouse to call LCSW as needed to discuss depression  issues of client  Client/spouse to talk with RN CM [name] to discuss nursing needs of client  Client to use relaxation techniques to help manage depression symptoms. (listen to music, walk outdoors, watch TV)  Talked with client about client completion of ADLs.  Initial goal documentation            Materials Provided: No  Follow Up Plan: LCSW to call client/spouse in next 4 weeks to talk with client/spouse about client management of depression symptoms  The patient/spouse verbalized understanding of instructions provided today and declined a print copy of patient instruction materials.   [name].Tattianna Schnarr MSW, LCSW Licensed Clinical Social Worker [facility] Care Management [phone]

## 2019-05-15 NOTE — Patient Instructions (Signed)
Visit Information  Goals Addressed            This Visit's Progress     Patient Stated   . "Client said he wanted to talk to someone about depression and managing depression" (pt-stated)   On track    Current Barriers:   [name] Mental Health Challenges in patient with Chronic Diagnoses of Depression, Hyperlipidemia, COPD , HTN, and Hx of CVA . Difficulty walking (uses cane to walk) . Fatigues easily . Hygiene challenges . Smokes daily  Clinical Social Work Clinical Goal(s):  [name] Over the next 30 days, client will work with LCSW to address concerns related to depression and mangement of depression  symptoms of client  Interventions: . LCSW talked with client/spouse of client about managing depression symptoms of client  . Encouraged client or spouse of client to talk with RN CM regarding nursing needs of client . Encouraged that client socialize with family or friends as a way to help manage depression symptoms   .   Encouraged client to use relaxation techniques to help manage depression symptoms (watch TV, walking outside, listen to music,visit with sister)   .  Talked with client about client's completion of ADLs    .  Talked with spouse of client about smoking of client   Patient Self Care Activities:  . Attends scheduled provider appointments . Takes medications as prescribed . Drives self to appointments  Plan:  LCSW to communicate with client/spouse in next 3 weeks to discuss management of depression symptoms of client  Client/spouse to call LCSW as needed to discuss depression issues of client  Client/spouse to talk with RN CM [name] to discuss nursing needs of client  Client to use relaxation techniques to help manage depression symptoms. (listen to music, walk outdoors, watch TV)  Talked with client about client completion of ADLs.  Initial goal documentation      Other   . Concern for Food Aspiration       Current Barriers:  [name] Knowledge Deficits related  to potential food aspiration  in patient with hx of CVA  Nurse Case Manager Clinical Goal(s):  [name] Over the next 7 days, patient/family will work with RN CCM regarding potential food aspiration  Interventions:  . Consulted by LCSW who spoke with wife on the phone today . Discussed that wife is concerned that patient gets strangled and coughs during meals at least once per day . Discussed LCSW concerns that he may be aspirating . RN scheduled outreach to patient/family on 05/16/2019 to discuss  Patient Self Care Activities:  . Has assistance from wife for most ADLs  Initial goal documentation      [name], BSN, RN-BC [place] / [place] Management Direct Dial: [phone]

## 2019-05-15 NOTE — Chronic Care Management (AMB) (Signed)
  Chronic Care Management   Care Coordination Note   05/15/2019 Name: [name] MRN: [id number] DOB: [birth year]  Referred by: Dettinger, Fransisca Kaufmann, MD Reason for referral : Chronic Care Management (care coordination)   Walter [name] is a 69 y.o. year old male who is a primary care patient of Dettinger, Fransisca Kaufmann, MD. The CCM team was consulted for assistance with chronic disease management and care coordination needs.    Review of patient status, including review of consultants reports, relevant laboratory and other test results, and collaboration with appropriate care team members and the patient's provider was performed as part of comprehensive patient evaluation and provision of chronic care management services.     RN Care Plan   . Concern for Food Aspiration       Current Barriers:  [name] Knowledge Deficits related to potential food aspiration in patient with history of CVA and COPD  Nurse Case Manager Clinical Goal(s):  [name] Over the next 7 days, patient/family will work with RN CCM regarding potential food aspiration  Interventions:  . Consulted by LCSW who spoke with wife on the phone today . Discussed that wife is concerned that patient gets strangled and coughs during meals at least once per day . Discussed LCSW concerns that he may be aspirating . RN scheduled outreach to patient/family on 05/16/2019 to discuss  Patient Self Care Activities:  . Has assistance from wife for most ADLs  Initial goal documentation         Plan:   The care management team will reach out to the patient again over the next 2 days.    [name], BSN, RN-BC Embedded Chronic Care Manager [facility] / [place] [facility] Direct Dial: [phone]

## 2019-05-16 ENCOUNTER — Ambulatory Visit: Payer: Medicare HMO | Admitting: *Deleted

## 2019-05-16 NOTE — Chronic Care Management (AMB) (Signed)
  Chronic Care Management   Outreach Note  05/16/2019 Name: Walter [name] MRN: [id number] DOB: Oct 26, 1950  Referred by: Dettinger, Fransisca Kaufmann, MD Reason for referral : Chronic Care Management (RN Outreach)   An unsuccessful telephone follow-up was attempted today. The patient was referred to the case management team for assistance with care management and care coordination. [name], LCSW spoke with patient's wife, Walter [name], yesterday and she relayed that [name]. [name] was concerned about aspiration. I reached out today to discuss this with them.    Follow Up Plan: The care management team will reach out to the patient again over the next 7 days.   [name], BSN, RN-BC Embedded Chronic Care Manager [facility] / [place] [facility] Direct Dial: [phone]

## 2019-05-17 ENCOUNTER — Ambulatory Visit: Payer: Medicare HMO | Admitting: *Deleted

## 2019-05-17 DIAGNOSIS — J449 Chronic obstructive pulmonary disease, unspecified: Secondary | ICD-10-CM

## 2019-05-17 DIAGNOSIS — Z8673 Personal history of transient ischemic attack (TIA), and cerebral infarction without residual deficits: Secondary | ICD-10-CM

## 2019-05-17 NOTE — Patient Instructions (Signed)
Visit Information  Goals Addressed            This Visit's Progress   . Concern for Food Aspiration       Current Barriers:  [name] Knowledge Deficits related to potential food aspiration  in patient with hx of CVA and COPD  Nurse Case Manager Clinical Goal(s):  [name] Over the next 7 days, patient/family will work with RN CCM regarding potential food aspiration  Interventions:  . Previously consulted by LCSW who spoke with wife on the phone today o Discussed that wife is concerned that patient gets strangled and coughs during meals at least once per day o Discussed LCSW concerns that he may be aspirating . Unsuccessful RN outreach to patient/wife on 05/16/2019 and 05/17/2019. Left message requesting return call.  Patient Self Care Activities:  . Has assistance from wife for most ADLs  Please see past updates related to this goal by clicking on the "Past Updates" button in the selected goal       [name], BSN, RN-BC [place] / [place] Management Direct Dial: [phone]

## 2019-05-17 NOTE — Chronic Care Management (AMB) (Signed)
  Chronic Care Management   Outreach Note  05/17/2019 Name: HAMADI GOLDMANN MRN: [id number] DOB: [birth year]  Referred by: Dettinger, Fransisca Kaufmann, MD Reason for referral : Chronic Care Management (RN outreach)   An unsuccessful telephone outreach was attempted today. The patient was referred to the case management team for assistance with care management and care coordination.   RN Care Plan           This Visit's Progress   . Concern for Food Aspiration       Current Barriers:  [name] Knowledge Deficits related to potential food aspiration  in patient with hx of CVA and COPD  Nurse Case Manager Clinical Goal(s):  [name] Over the next 7 days, patient/family will work with RN CCM regarding potential food aspiration  Interventions:  . Previously consulted by LCSW who spoke with wife on the phone today o Discussed that wife is concerned that patient gets strangled and coughs during meals at least once per day o Discussed LCSW concerns that he may be aspirating . Unsuccessful RN outreach to patient/wife on 05/16/2019 and 05/17/2019. Left message requesting return call.  Patient Self Care Activities:  . Has assistance from wife for most ADLs  Please see past updates related to this goal by clicking on the "Past Updates" button in the selected goal         Follow Up Plan: A HIPPA compliant phone message was left for the patient providing contact information and requesting a return call.  The care management team will reach out to the patient again over the next 7 days.   [name], BSN, RN-BC Embedded Chronic Care Manager [facility] / [place] [facility] Direct Dial: [phone]

## 2019-05-20 ENCOUNTER — Ambulatory Visit: Payer: Medicare HMO | Admitting: *Deleted

## 2019-05-20 NOTE — Chronic Care Management (AMB) (Signed)
  Chronic Care Management   Outreach Note  05/20/2019 Name: Walter [name] MRN: [id number] DOB: 11-Sep-1950  Referred by: Dettinger, Fransisca Kaufmann, MD Reason for referral : Chronic Care Management (RN Outreach)   An unsuccessful telephone outreach was attempted today. The patient was referred to the case management team for assistance with care management and care coordination.   Follow Up Plan: A HIPPA compliant phone message was left for the patient providing contact information and requesting a return call.  The care management team will reach out to the patient again over the next 14 days.   [name], BSN, RN-BC Embedded Chronic Care Manager [facility] / [place] [facility] Direct Dial: [phone]

## 2019-06-04 DIAGNOSIS — H02834 Dermatochalasis of left upper eyelid: Secondary | ICD-10-CM | POA: Diagnosis not present

## 2019-06-04 DIAGNOSIS — H02831 Dermatochalasis of right upper eyelid: Secondary | ICD-10-CM | POA: Diagnosis not present

## 2019-06-04 DIAGNOSIS — Z85828 Personal history of other malignant neoplasm of skin: Secondary | ICD-10-CM | POA: Diagnosis not present

## 2019-06-04 DIAGNOSIS — H57813 Brow ptosis, bilateral: Secondary | ICD-10-CM | POA: Diagnosis not present

## 2019-06-11 ENCOUNTER — Ambulatory Visit (INDEPENDENT_AMBULATORY_CARE_PROVIDER_SITE_OTHER): Payer: Medicare HMO | Admitting: Licensed Clinical Social Worker

## 2019-06-11 DIAGNOSIS — F339 Major depressive disorder, recurrent, unspecified: Secondary | ICD-10-CM

## 2019-06-11 DIAGNOSIS — I10 Essential (primary) hypertension: Secondary | ICD-10-CM

## 2019-06-11 DIAGNOSIS — Z8673 Personal history of transient ischemic attack (TIA), and cerebral infarction without residual deficits: Secondary | ICD-10-CM

## 2019-06-11 DIAGNOSIS — E782 Mixed hyperlipidemia: Secondary | ICD-10-CM

## 2019-06-11 DIAGNOSIS — J449 Chronic obstructive pulmonary disease, unspecified: Secondary | ICD-10-CM | POA: Diagnosis not present

## 2019-06-11 NOTE — Patient Instructions (Addendum)
Licensed Clinical Social Worker Visit Information  Goals we discussed today:  Goals    . "Client said he wanted to talk to someone about depression and managing depression" (pt-stated)     Current Barriers:   [name] Mental Health Challenges in patient with Chronic Diagnoses of Depression, Hyperlipidemia, COPD , HTN, and Hx of CVA . Difficulty walking (uses cane to walk) . Fatigues easily . Hygiene challenges . Smokes daily  Clinical Social Work Clinical Goal(s):  [name] Over the next 30 days, client will work with LCSW to address concerns related to depression and mangement of depression  symptoms of client  Interventions:  Previously encouraged that client socialize with family or friends as a way to help manage depression symptoms  Talked with spouse of client about smoking of client  Previously LCSW talked with client/spouse of client about managing depression symptoms of client  Previously encouraged client or spouse of client to talk with RN CM regarding nursing needs of client  Encouraged client previously to use relaxation techniques to help manage depression symptoms (watch TV, walking outside, listen to music,visit with sister)  Talked with Walter [name] about client completion of ADLs  Talked with Walter [name] about ambulation challenges of client Talked with Walter [name] about client's physical therapy appointments which he recently has completed   Collaborated with RNCM regarding possible aspiration issues of client Walter [name] said client sometimes chokes a little when eating meals and has to cough because of some choking. She said this happens almost daily)   Patient Self Care Activities:  . Attends scheduled provider appointments . Takes medications as prescribed . Drives self to appointments  Plan:  LCSW to communicate with client/spouse in next 4 weeks to discuss management of depression symptoms of client  Client/spouse to call LCSW as needed to discuss depression  issues of client  Client/spouse to talk with RN CM Walter [name] to discuss nursing needs of client  Client to use relaxation techniques to help manage depression symptoms. (listen to music, walk outdoors, watch TV)  Talked with client about client completion of ADLs.  Initial goal documentation        Materials Provided: No  Follow Up Plan: LCSW to call client/spouse in next 4 weeks to discuss management of depression symptoms of client  The patient Walter [name], spouse, verbalized understanding of instructions provided today and declined a print copy of patient instruction materials.   Walter [name].Walter [name] MSW, LCSW Licensed Clinical Social Worker [facility] Care Management [phone]

## 2019-06-11 NOTE — Chronic Care Management (AMB) (Signed)
  Care Management Note   Walter [name] is a 69 y.o. year old male who is a primary care patient of Dettinger, Fransisca Kaufmann, MD. The CM team was consulted for assistance with chronic disease management and care coordination.   I reached out to Walter [name], spouse of client, by phone today.    Review of patient status, including review of consultants reports, relevant laboratory and other test results, and collaboration with appropriate care team members and the patient's provider was performed as part of comprehensive patient evaluation and provision of chronic care management services.  Social determinants of health: risk of tobacco use; risk of depression; risk of stress    Chronic Care Management from 05/08/2018 in [place]  PHQ-9 Total Score  9     Medications   amLODipine (NORVASC) 5 MG tablet atorvastatin (LIPITOR) 40 MG tablet buPROPion (WELLBUTRIN XL) 300 MG 24 hr tablet clopidogrel (PLAVIX) 75 MG tablet terbinafine (LAMISIL) 250 MG tablet   Goals    . "Client said he wanted to talk to someone about depression and managing depression" (pt-stated)     Current Barriers:   Walter [name] Mental Health Challenges in patient with Chronic Diagnoses of Depression, Hyperlipidemia, COPD , HTN, and Hx of CVA . Difficulty walking (uses cane to walk) . Fatigues easily . Hygiene challenges . Smokes daily  Clinical Social Work Clinical Goal(s):  Walter [name] Over the next 30 days, client will work with LCSW to address concerns related to depression and mangement of depression  symptoms of client  Interventions:  Previously encouraged that client socialize with family or friends as a way to help manage depression symptoms  Talked withspouse of client about smoking of client  PreviouslyLCSW talked with client/spouse of client about managing depression symptoms of client  Previously encouraged client or spouse of client to talk with RN CM regarding nursing needs of  client  Encouraged clientpreviouslyto use relaxation techniques to help manage depression symptoms (watch TV, walking outside, listen to music,visit with sister)   Talked with Walter [name] about client completion of ADLs  Talkedwith Walter [name] about ambulation challenges of client  Walter [name] about client's physical therapy appointments which he recently has completed . Collaborated with RNCM regarding possible aspiration issues of client Walter [name] said client sometimes chokes a little when eating meals and has to cough because of some choking. She said this happens almost daily)   Patient Self Care Activities:  . Attends scheduled provider appointments . Takes medications as prescribed . Drives self to appointments  Plan:  LCSW to communicate with client/spouse in next 4 weeks to discuss management of depression symptoms of client  Client/spouse to call LCSW as needed to discuss depression issues of client  Client/spouse to talk with RN CM [name] to discuss nursing needs of client  Client to use relaxation techniques to help manage depression symptoms. (listen to music, walk outdoors, watch TV)  Talked with client about client completion of ADLs.  Initial goal documentation    Follow Up Plan: LCSW to call client or spouse in next 4 weeks to talk with client or spouse about management of depression symptoms of client  Walter [name].Walter [name] MSW, LCSW Licensed Clinical Social Worker [facility] Care Management [phone]

## 2019-06-26 ENCOUNTER — Ambulatory Visit: Payer: Medicare HMO | Admitting: *Deleted

## 2019-06-26 ENCOUNTER — Telehealth: Payer: Medicare HMO

## 2019-06-26 NOTE — Chronic Care Management (AMB) (Signed)
  Chronic Care Management   Outreach Note  06/26/2019 Name: Walter [name] MRN: [id number] DOB: 1950/10/23  Referred by: Dettinger, Fransisca Kaufmann, MD Reason for referral : Chronic Care Management (RN follow up)   An unsuccessful telephone follow-up was attempted today. The patient was referred to the case management team for assistance with care management and care coordination.   Follow Up Plan: The care management team will reach out to the patient again over the next 30 days.   [name], BSN, RN-BC Embedded Chronic Care Manager [facility] / [place] [facility] Direct Dial: [phone]

## 2019-07-11 ENCOUNTER — Ambulatory Visit (INDEPENDENT_AMBULATORY_CARE_PROVIDER_SITE_OTHER): Payer: Medicare HMO | Admitting: Licensed Clinical Social Worker

## 2019-07-11 DIAGNOSIS — I10 Essential (primary) hypertension: Secondary | ICD-10-CM | POA: Diagnosis not present

## 2019-07-11 DIAGNOSIS — F339 Major depressive disorder, recurrent, unspecified: Secondary | ICD-10-CM

## 2019-07-11 DIAGNOSIS — E782 Mixed hyperlipidemia: Secondary | ICD-10-CM

## 2019-07-11 DIAGNOSIS — J449 Chronic obstructive pulmonary disease, unspecified: Secondary | ICD-10-CM | POA: Diagnosis not present

## 2019-07-11 DIAGNOSIS — Z8673 Personal history of transient ischemic attack (TIA), and cerebral infarction without residual deficits: Secondary | ICD-10-CM

## 2019-07-11 NOTE — Patient Instructions (Addendum)
Licensed Clinical Social Worker Visit Information  Goals we discussed today:  Goals    . "Client said he wanted to talk to someone about depression and managing depression" (pt-stated)     Current Barriers:   [name] Mental Health Challenges in patient with Chronic Diagnoses of Depression, Hyperlipidemia, COPD , HTN, and Hx of CVA . Difficulty walking (uses cane to walk) . Fatigues easily . Hygiene challenges . Smokes daily  Clinical Social Work Clinical Goal(s):  [name] Over the next 30 days, client will work with LCSW to address concerns related to depression and mangement of depression  symptoms of client  Interventions: Previously encouraged that client socialize with family or friends as a way to help manage depression symptoms  Talked with spouse of client about smoking of client  Previously LCSW talked with client/spouse of client about managing depression symptoms of client  Previously encouraged client or spouse of client to talk with RN CM regarding nursing needs of client  Encouraged client previously to use relaxation techniques to help manage depression symptoms (watch TV, walking outside, listen to music,visit with sister, visiting with his great grandchildren)  Talked with [name] about client completion of ADLs  Talked with [name] about ambulation challenges of client Previously collaborated with [facility] regarding possible aspiration issues of client Slevin Saurer said client sometimes chokes a little when eating meals and has to cough because of some choking. She said this happens almost daily)  Talked with [name] about reduced appetite of client  Talked with Halsey Crumedy about client decrease in energy  Patient Self Care Activities:  . Attends scheduled provider appointments . Takes medications as prescribed . Drives self to appointments  Plan:  LCSW to communicate with client/spouse in next 4 weeks to discuss management of depression symptoms of  client  Client/spouse to call LCSW as needed to discuss depression issues of client  Client/spouse to talk with RN CM [name] to discuss nursing needs of client  Client to use relaxation techniques to help manage depression symptoms. (listen to music, walk outdoors, watch TV)  Talked with client about client completion of ADLs.  Initial goal documentation       Materials Provided: No  Follow Up Plan: LCSW to call client/spouse in next 4 weeks to discuss management of depression symptoms of client  The patient Walter [name], spouse, verbalized understanding of instructions provided today and declined a print copy of patient instruction materials.   [name].Goldye Tourangeau MSW, LCSW Licensed Clinical Social Worker [facility] Care Management [phone]

## 2019-07-11 NOTE — Chronic Care Management (AMB) (Signed)
  Care Management Note   Walter [name] is a 69 y.o. year old male who is a primary care patient of Dettinger, Fransisca Kaufmann, MD. The CM team was consulted for assistance with chronic disease management and care coordination.   I reached out to Fisher, spouse, by phone today.   Review of patient status, including review of consultants reports, relevant laboratory and other test results, and collaboration with appropriate care team members and the patient's provider was performed as part of comprehensive patient evaluation and provision of chronic care management services.  Social determinants of health:risk of tobacco use; risk of depression    Chronic Care Management from 05/08/2018 in [place]  PHQ-9 Total Score  9     Medications   amLODipine (NORVASC) 5 MG tablet atorvastatin (LIPITOR) 40 MG tablet buPROPion (WELLBUTRIN XL) 300 MG 24 hr tablet clopidogrel (PLAVIX) 75 MG tablet terbinafine (LAMISIL) 250 MG tablet   Goals    . "Client said he wanted to talk to someone about depression and managing depression" (pt-stated)     Current Barriers:   [name] Mental Health Challenges in patient with Chronic Diagnoses of Depression, Hyperlipidemia, COPD , HTN, and Hx of CVA . Difficulty walking (uses cane to walk) . Fatigues easily . Hygiene challenges . Smokes daily  Clinical Social Work Clinical Goal(s):  [name] Over the next 30 days, client will work with LCSW to address concerns related to depression and mangement of depression  symptoms of client  Interventions:  Previously encouraged that client socialize with family or friends as a way to help manage depression symptoms  Talked withspouse of client about smoking of client  PreviouslyLCSW talked with client/spouse of client about managing depression symptoms of client  Previously encouraged client or spouse of client to talk with RN CM regarding nursing needs of client  Encouraged  clientpreviouslyto use relaxation techniques to help manage depression symptoms (watch TV, walking outside, listen to music,visit with sister, visiting with his great grandchildren)   Talked with [name] about client completion of ADLs  [place] about ambulation challenges of client  Previously collaborated with [facility] regarding possible aspiration issues of client Dadrian Dietz said client sometimes chokes a little when eating meals and has to cough because of some choking. She said this happens almost daily)   Talked with [name] about reduced appetite of client  Talked with Ann Kindschi about client decrease in energy.   Patient Self Care Activities:  . Attends scheduled provider appointments . Takes medications as prescribed . Drives self to appointments  Plan:  LCSW to communicate with client/spouse in next 4 weeks to discuss management of depression symptoms of client  Client/spouse to call LCSW as needed to discuss depression issues of client  Client/spouse to talk with RN CM [name] to discuss nursing needs of client  Client to use relaxation techniques to help manage depression symptoms. (listen to music, walk outdoors, watch TV)  Talked with client about client completion of ADLs.  Initial goal documentation       Follow Up Plan: LCSW to communicate with client/spouse in next 4 weeks to discuss client management of depression symptoms  [name].Schon Zeiders MSW, LCSW Licensed Clinical Social Worker [facility] Care Management [phone]

## 2019-07-16 ENCOUNTER — Ambulatory Visit: Payer: Medicare HMO | Admitting: *Deleted

## 2019-07-16 NOTE — Chronic Care Management (AMB) (Signed)
  Chronic Care Management   Outreach Note  07/16/2019 Name: Walter [name] MRN: [id number] DOB: 10-30-1950  Referred by: Dettinger, Fransisca Kaufmann, MD Reason for referral : Chronic Care Management (RN follow up)   An unsuccessful telephone follow-up was attempted today. The patient was referred to the case management team for assistance with care management and care coordination.   Follow Up Plan: A HIPPA compliant phone message was left for the patient providing contact information and requesting a return call.  The care management team will reach out to the patient again over the next 15 days.   [name], BSN, RN-BC Embedded Chronic Care Manager [facility] / [place] [facility] Direct Dial: [phone]

## 2019-07-18 ENCOUNTER — Other Ambulatory Visit: Payer: Self-pay | Admitting: Family Medicine

## 2019-07-29 ENCOUNTER — Telehealth: Payer: Medicare HMO

## 2019-07-31 ENCOUNTER — Ambulatory Visit: Payer: Medicare HMO | Admitting: *Deleted

## 2019-07-31 DIAGNOSIS — J449 Chronic obstructive pulmonary disease, unspecified: Secondary | ICD-10-CM | POA: Diagnosis not present

## 2019-07-31 DIAGNOSIS — F339 Major depressive disorder, recurrent, unspecified: Secondary | ICD-10-CM | POA: Diagnosis not present

## 2019-07-31 DIAGNOSIS — E782 Mixed hyperlipidemia: Secondary | ICD-10-CM | POA: Diagnosis not present

## 2019-07-31 DIAGNOSIS — Z8673 Personal history of transient ischemic attack (TIA), and cerebral infarction without residual deficits: Secondary | ICD-10-CM

## 2019-07-31 DIAGNOSIS — I10 Essential (primary) hypertension: Secondary | ICD-10-CM | POA: Diagnosis not present

## 2019-07-31 DIAGNOSIS — R296 Repeated falls: Secondary | ICD-10-CM

## 2019-07-31 NOTE — Patient Instructions (Signed)
Visit Information  Goals Addressed            This Visit's Progress     Patient Stated   . Wife states: "I'm worried that he doesn't eat very much" (pt-stated)       CARE PLAN ENTRY (see longitudinal plan of care for additional care plan information)  Current Barriers:  . Care Coordination needs related to decreased appetite in a patient with hx of CVA (disease states)  Nurse Case Manager Clinical Goal(s):  [name] Over the next 45 days, patient will meet with RN Care Manager to address decreased appetite  Interventions:  . Inter-disciplinary care team collaboration (see longitudinal plan of care) . Chart reviewed . Talked with wife by telephone o States that he doesn't eat as much as he used to o They typically eat 2 meals a day and with a snack in between o Eating smaller portions than before o Doesn't use an supplements like Boost or Ensure. Doesn't like the way they taste . Discussed changes after CVA, decreased activity level, and potential changes in taste as possible reasons for decreased appetite . Discussed providing foods that he enjoys eating . Discussed monitoring weight and advised to reach out to PCP with any noticeable weight loss . Advised that Megace may be an option to discuss with PCP if appetite doesn't improve or patient experiences weight loss . Provided with RN contact info and encouraged to reach out as needed  Patient Self Care Activities:  . Has assistance from wife with ADLs and IADLs since having CVA  Initial goal documentation     . Wife States:"I'm worried that he's getting choked when he eats" (pt-stated)       Current Barriers:  [name] Knowledge Deficits related to potential food aspiration  in patient with hx of CVA and COPD  Nurse Case Manager Clinical Goal(s):  [name] Over the next 30 days, patient/wife will notify PCP if difficulty swallowing or chocking while eating returns  Interventions:  . Previously consulted by LCSW . Talked with wife by  telephone o Reports that he was having episodes of getting strangled/choked while eating and she was concerned that he was aspirating o This has improved and she hasn't noticed an episode in the last week or so . Advised to reach out to PCP with any new or worsening symptoms . Provided with RN CCM contact information and encouraged to reach out as needed  Patient Self Care Activities:  . Has assistance from wife for most ADLs  Please see past updates related to this goal by clicking on the "Past Updates" button in the selected goal        Other   . Fall Prevention       High fall risk in a patient with hx of CVA, hypertension, and COPD  Current Barriers:  [name] Knowledge Deficits related to fall precautions . Decreased adherence to prescribed treatment for fall prevention  Nurse Case Manager Clinical Goal(s):  [name] Over the next 30 days, patient will demonstrate improved adherence to prescribed treatment plan for decreasing falls as evidenced by patient reporting and review of EMR . Over the next 30 days, patient will verbalize using fall risk reduction strategies discussed . Over the next 90 days, patient will not experience additional falls  Interventions:  . Provided verbal education re: Potential causes of falls and Fall prevention strategies . Assessed for falls since last encounter. . Assessed patients knowledge of fall risk prevention secondary to previously provided education .  Discussed most recent fall o Fell backwards off of porch last week o Still has back pain. Wife tried to schedule appt for patient declined.  o Using heating pad for pain . Encouraged patient/wife to schedule an appointment for an evaluation by PCP and possible xrays since he is still having pain . Encouraged patient to: [name] Utilize assistive devices appropriately with all ambulation . De-clutter walkways . Change positions slowly . Wear secure fitting shoes at all times with ambulation . Utilize home  lighting for dim lit areas . Have self and pet awareness at all times  Patient Self Care Activities:  . Patient depends on wife for assistance with ADLs and IADLs s/p CVA    Initial goal documentation        The patient verbalized understanding of instructions provided today and declined a print copy of patient instruction materials.   Follow-up Plan The care management team will reach out to the patient again over the next 30 days.   [name], BSN, RN-BC Embedded Chronic Care Manager [facility] [place] [facility] Direct Dial: [phone]

## 2019-07-31 NOTE — Chronic Care Management (AMB) (Signed)
Chronic Care Management   Follow Up Note   07/31/2019 Name: Walter [name] MRN: [id number] DOB: 04-06-1950  Referred by: Dettinger, Fransisca Kaufmann, MD Reason for referral : Chronic Care Management   Walter [name] is a 69 y.o. year old male who is a primary care patient of Dettinger, Fransisca Kaufmann, MD. The CCM team was consulted for assistance with chronic disease management and care coordination needs.    Review of patient status, including review of consultants reports, relevant laboratory and other test results, and collaboration with appropriate care team members and the patient's provider was performed as part of comprehensive patient evaluation and provision of chronic care management services.    SDOH (Social Determinants of Health) assessments performed: No See Care Plan activities for detailed interventions related to [place])    I talked with Walter [name]'s wife, Walter [name], by telephone today regarding management of his chronic medical conditions.    Outpatient Encounter Medications as of 07/31/2019  Medication Sig  . amLODipine (NORVASC) 5 MG tablet TAKE 1 TABLET EVERY DAY  . atorvastatin (LIPITOR) 40 MG tablet Take 1 tablet (40 mg total) by mouth daily.  [name] buPROPion (WELLBUTRIN XL) 300 MG 24 hr tablet Take 1 tablet (300 mg total) by mouth daily.  . clopidogrel (PLAVIX) 75 MG tablet Take 1 tablet (75 mg total) by mouth daily.  [name] terbinafine (LAMISIL) 250 MG tablet Take 1 tablet (250 mg total) by mouth daily.   No facility-administered encounter medications on file as of 07/31/2019.      RN Care Plan   . Wife states: "I'm worried that he doesn't eat very much" (pt-stated)       CARE PLAN ENTRY (see longitudinal plan of care for additional care plan information)  Current Barriers:  . Care Coordination needs related to decreased appetite in a patient with hx of CVA (disease states)  Nurse Case Manager Clinical Goal(s):  [name] Over the next 45 days, patient will meet with RN Care Manager to address  decreased appetite  Interventions:  . Inter-disciplinary care team collaboration (see longitudinal plan of care) . Chart reviewed . Talked with wife by telephone o States that he doesn't eat as much as he used to o They typically eat 2 meals a day and with a snack in between o Eating smaller portions than before o Doesn't use an supplements like Boost or Ensure. Doesn't like the way they taste . Discussed changes after CVA, decreased activity level, and potential changes in taste as possible reasons for decreased appetite . Discussed providing foods that he enjoys eating . Discussed monitoring weight and advised to reach out to PCP with any noticeable weight loss . Advised that Megace may be an option to discuss with PCP if appetite doesn't improve or patient experiences weight loss . Provided with RN contact info and encouraged to reach out as needed  Patient Self Care Activities:  . Has assistance from wife with ADLs and IADLs since having CVA  Initial goal documentation     . Wife States:"I'm worried that he's getting choked when he eats" (pt-stated)       Current Barriers:  [name] Knowledge Deficits related to potential food aspiration  in patient with hx of CVA and COPD  Nurse Case Manager Clinical Goal(s):  [name] Over the next 30 days, patient/wife will notify PCP if difficulty swallowing or chocking while eating returns  Interventions:  . Previously consulted by LCSW . Talked with wife by telephone o Reports that he was having episodes  of getting strangled/choked while eating and she was concerned that he was aspirating o This has improved and she hasn't noticed an episode in the last week or so . Advised to reach out to PCP with any new or worsening symptoms . Provided with RN CCM contact information and encouraged to reach out as needed  Patient Self Care Activities:  . Has assistance from wife for most ADLs  Please see past updates related to this goal by clicking on the  "Past Updates" button in the selected goal      . Fall Prevention       High fall risk in a patient with hx of CVA, hypertension, and COPD  Current Barriers:  [name] Knowledge Deficits related to fall precautions . Decreased adherence to prescribed treatment for fall prevention  Nurse Case Manager Clinical Goal(s):  [name] Over the next 30 days, patient will demonstrate improved adherence to prescribed treatment plan for decreasing falls as evidenced by patient reporting and review of EMR . Over the next 30 days, patient will verbalize using fall risk reduction strategies discussed . Over the next 90 days, patient will not experience additional falls  Interventions:  . Provided verbal education re: Potential causes of falls and Fall prevention strategies . Assessed for falls since last encounter. . Assessed patients knowledge of fall risk prevention secondary to previously provided education . Discussed most recent fall o Fell backwards off of porch last week o Still has back pain. Wife tried to schedule appt for patient declined.  o Using heating pad for pain . Encouraged patient/wife to schedule an appointment for an evaluation by PCP and possible xrays since he is still having pain . Encouraged patient to: [name] Utilize assistive devices appropriately with all ambulation . De-clutter walkways . Change positions slowly . Wear secure fitting shoes at all times with ambulation . Utilize home lighting for dim lit areas . Have self and pet awareness at all times  Patient Self Care Activities:  . Patient depends on wife for assistance with ADLs and IADLs s/p CVA    Initial goal documentation         Plan:   The care management team will reach out to the patient again over the next 30 days.    Walter [name], BSN, RN-BC Embedded Chronic Care Manager [facility] [place] [facility] Direct Dial: [phone]

## 2019-08-12 ENCOUNTER — Ambulatory Visit: Payer: Medicare HMO | Admitting: Licensed Clinical Social Worker

## 2019-08-12 DIAGNOSIS — F339 Major depressive disorder, recurrent, unspecified: Secondary | ICD-10-CM

## 2019-08-12 DIAGNOSIS — E782 Mixed hyperlipidemia: Secondary | ICD-10-CM

## 2019-08-12 DIAGNOSIS — J449 Chronic obstructive pulmonary disease, unspecified: Secondary | ICD-10-CM

## 2019-08-12 DIAGNOSIS — Z8673 Personal history of transient ischemic attack (TIA), and cerebral infarction without residual deficits: Secondary | ICD-10-CM

## 2019-08-12 DIAGNOSIS — I10 Essential (primary) hypertension: Secondary | ICD-10-CM

## 2019-08-12 NOTE — Patient Instructions (Addendum)
Licensed Clinical Social Worker Visit Information  Materials Provided: No  08/12/2019   Name: ARIHANT BYRDSONG MRN: [id number] DOB: [birth year]   MACEY WEARING is a 69 y.o. year old male who is a primary care patient of Dettinger, Fransisca Kaufmann, MD. The CCM team was consulted for assistance with .Community resources   Review of patient status, including review of consultants reports, other relevant assessments, and collaboration with appropriate care team members and the patient's provider was performed as part of comprehensive patient evaluation and provision of chronic care management services.   SDOH (Social Determinants of Health) assessments performed: No; risk for tobacco use; risk for depression   LCSW called home phone number of client today and was not able to speak with client or his spouse via phone today. Voice message system was not set up and thus LCSW was not able to leave phone message for client or his spouse   Follow Up Plan:LCSW to communicate with client/spouse in next 4 weeks to discuss client management of depression symptoms  LCSW was not able to speak with client or spouse of client today via phone; thus, the patient or spouse were not able to verbalize understanding of instructions provided today were not able to accept or decline a print copy of patient instruction materials.   [name].Jaquita Bessire MSW, LCSW Licensed Clinical Social Worker [facility] Care Management [phone]

## 2019-08-12 NOTE — Chronic Care Management (AMB) (Signed)
  Chronic Care Management    Clinical Social Work Follow Up Note  08/12/2019 Name: Walter [name] MRN: [id number] DOB: 10/01/1950  Walter [name] is a 69 y.o. year old male who is a primary care patient of Dettinger, Fransisca Kaufmann, MD. The CCM team was consulted for assistance with .Community resources  Review of patient status, including review of consultants reports, other relevant assessments, and collaboration with appropriate care team members and the patient's provider was performed as part of comprehensive patient evaluation and provision of chronic care management services.    SDOH (Social Determinants of Health) assessments performed: No; risk for tobacco use; risk for depression    Chronic Care Management from 05/08/2018 in [place]  PHQ-9 Total Score  9      Outpatient Encounter Medications as of 08/12/2019  Medication Sig  . amLODipine (NORVASC) 5 MG tablet TAKE 1 TABLET EVERY DAY  . atorvastatin (LIPITOR) 40 MG tablet Take 1 tablet (40 mg total) by mouth daily.  [name] buPROPion (WELLBUTRIN XL) 300 MG 24 hr tablet Take 1 tablet (300 mg total) by mouth daily.  . clopidogrel (PLAVIX) 75 MG tablet Take 1 tablet (75 mg total) by mouth daily.  [name] terbinafine (LAMISIL) 250 MG tablet Take 1 tablet (250 mg total) by mouth daily.   No facility-administered encounter medications on file as of 08/12/2019.    LCSW called home phone number of client today and was not able to speak with client or his spouse via phone today. Voice message system was not set up and thus LCSW was not able to leave phone message for client or his spouse   Follow Up Plan: LCSW to communicate with client/spouse in next 4 weeks to discuss client management of depression symptoms  [name].Deriana Vanderhoef MSW, LCSW Licensed Clinical Social Worker [facility] Care Management [phone]

## 2019-08-30 ENCOUNTER — Ambulatory Visit: Payer: Medicare HMO | Admitting: *Deleted

## 2019-08-30 DIAGNOSIS — Z8673 Personal history of transient ischemic attack (TIA), and cerebral infarction without residual deficits: Secondary | ICD-10-CM

## 2019-08-30 DIAGNOSIS — I10 Essential (primary) hypertension: Secondary | ICD-10-CM

## 2019-09-05 NOTE — Chronic Care Management (AMB) (Signed)
  Chronic Care Management   Follow-up Note  08/30/2019 Name: Walter [name] MRN: [id number] DOB: Jan 19, 1951  Referred by: Dettinger, Fransisca Kaufmann, MD Reason for referral : Chronic Care Management (RN follow up)   An unsuccessful telephone follow-up was attempted today. The patient was referred to the case management team for assistance with care management and care coordination.   Follow Up Plan: A HIPPA compliant phone message was left for the patient providing contact information and requesting a return call.  The care management team will reach out to the patient again over the next 30 days.   [name], BSN, RN-BC Embedded Chronic Care Manager [facility] [place] [facility] Direct Dial: [phone]

## 2019-09-06 DIAGNOSIS — Z20822 Contact with and (suspected) exposure to covid-19: Secondary | ICD-10-CM | POA: Diagnosis not present

## 2019-09-06 DIAGNOSIS — J939 Pneumothorax, unspecified: Secondary | ICD-10-CM | POA: Diagnosis not present

## 2019-09-06 DIAGNOSIS — S72001A Fracture of unspecified part of neck of right femur, initial encounter for closed fracture: Secondary | ICD-10-CM | POA: Diagnosis not present

## 2019-09-06 DIAGNOSIS — M79642 Pain in left hand: Secondary | ICD-10-CM | POA: Diagnosis not present

## 2019-09-06 DIAGNOSIS — F17210 Nicotine dependence, cigarettes, uncomplicated: Secondary | ICD-10-CM | POA: Diagnosis not present

## 2019-09-06 DIAGNOSIS — W01198A Fall on same level from slipping, tripping and stumbling with subsequent striking against other object, initial encounter: Secondary | ICD-10-CM | POA: Diagnosis not present

## 2019-09-06 DIAGNOSIS — S72002A Fracture of unspecified part of neck of left femur, initial encounter for closed fracture: Secondary | ICD-10-CM | POA: Diagnosis not present

## 2019-09-06 DIAGNOSIS — I10 Essential (primary) hypertension: Secondary | ICD-10-CM | POA: Diagnosis not present

## 2019-09-06 DIAGNOSIS — F329 Major depressive disorder, single episode, unspecified: Secondary | ICD-10-CM | POA: Diagnosis not present

## 2019-09-06 DIAGNOSIS — M25552 Pain in left hip: Secondary | ICD-10-CM | POA: Diagnosis not present

## 2019-09-06 DIAGNOSIS — R9431 Abnormal electrocardiogram [ECG] [EKG]: Secondary | ICD-10-CM | POA: Diagnosis not present

## 2019-09-06 DIAGNOSIS — E785 Hyperlipidemia, unspecified: Secondary | ICD-10-CM | POA: Diagnosis not present

## 2019-09-06 DIAGNOSIS — Z8673 Personal history of transient ischemic attack (TIA), and cerebral infarction without residual deficits: Secondary | ICD-10-CM | POA: Diagnosis not present

## 2019-09-07 ENCOUNTER — Other Ambulatory Visit: Payer: Self-pay

## 2019-09-07 ENCOUNTER — Encounter (HOSPITAL_COMMUNITY): Payer: Self-pay | Admitting: Internal Medicine

## 2019-09-07 ENCOUNTER — Inpatient Hospital Stay (HOSPITAL_COMMUNITY)
Admission: AD | Admit: 2019-09-07 | Discharge: 2019-09-14 | DRG: 522 | Disposition: A | Discharge status: Home Health | Payer: Medicare HMO | Source: Other Acute Inpatient Hospital | Attending: Internal Medicine | Admitting: Internal Medicine

## 2019-09-07 DIAGNOSIS — R471 Dysarthria and anarthria: Secondary | ICD-10-CM | POA: Diagnosis present

## 2019-09-07 DIAGNOSIS — Z96649 Presence of unspecified artificial hip joint: Secondary | ICD-10-CM

## 2019-09-07 DIAGNOSIS — I10 Essential (primary) hypertension: Secondary | ICD-10-CM | POA: Diagnosis not present

## 2019-09-07 DIAGNOSIS — S72002A Fracture of unspecified part of neck of left femur, initial encounter for closed fracture: Principal | ICD-10-CM

## 2019-09-07 DIAGNOSIS — Z8673 Personal history of transient ischemic attack (TIA), and cerebral infarction without residual deficits: Secondary | ICD-10-CM | POA: Diagnosis not present

## 2019-09-07 DIAGNOSIS — Z7401 Bed confinement status: Secondary | ICD-10-CM | POA: Diagnosis not present

## 2019-09-07 DIAGNOSIS — Y92009 Unspecified place in unspecified non-institutional (private) residence as the place of occurrence of the external cause: Secondary | ICD-10-CM | POA: Diagnosis not present

## 2019-09-07 DIAGNOSIS — E782 Mixed hyperlipidemia: Secondary | ICD-10-CM | POA: Diagnosis present

## 2019-09-07 DIAGNOSIS — E785 Hyperlipidemia, unspecified: Secondary | ICD-10-CM | POA: Diagnosis not present

## 2019-09-07 DIAGNOSIS — F329 Major depressive disorder, single episode, unspecified: Secondary | ICD-10-CM | POA: Diagnosis present

## 2019-09-07 DIAGNOSIS — M79642 Pain in left hand: Secondary | ICD-10-CM | POA: Diagnosis not present

## 2019-09-07 DIAGNOSIS — N401 Enlarged prostate with lower urinary tract symptoms: Secondary | ICD-10-CM | POA: Diagnosis present

## 2019-09-07 DIAGNOSIS — F339 Major depressive disorder, recurrent, unspecified: Secondary | ICD-10-CM | POA: Diagnosis present

## 2019-09-07 DIAGNOSIS — R5381 Other malaise: Secondary | ICD-10-CM | POA: Diagnosis not present

## 2019-09-07 DIAGNOSIS — Z79899 Other long term (current) drug therapy: Secondary | ICD-10-CM

## 2019-09-07 DIAGNOSIS — Z9181 History of falling: Secondary | ICD-10-CM | POA: Diagnosis not present

## 2019-09-07 DIAGNOSIS — Z720 Tobacco use: Secondary | ICD-10-CM | POA: Diagnosis present

## 2019-09-07 DIAGNOSIS — Z7902 Long term (current) use of antithrombotics/antiplatelets: Secondary | ICD-10-CM

## 2019-09-07 DIAGNOSIS — E876 Hypokalemia: Secondary | ICD-10-CM | POA: Diagnosis not present

## 2019-09-07 DIAGNOSIS — R1310 Dysphagia, unspecified: Secondary | ICD-10-CM | POA: Diagnosis present

## 2019-09-07 DIAGNOSIS — M6281 Muscle weakness (generalized): Secondary | ICD-10-CM | POA: Diagnosis not present

## 2019-09-07 DIAGNOSIS — Z471 Aftercare following joint replacement surgery: Secondary | ICD-10-CM | POA: Diagnosis not present

## 2019-09-07 DIAGNOSIS — R338 Other retention of urine: Secondary | ICD-10-CM | POA: Diagnosis present

## 2019-09-07 DIAGNOSIS — W19XXXA Unspecified fall, initial encounter: Secondary | ICD-10-CM | POA: Diagnosis not present

## 2019-09-07 DIAGNOSIS — R319 Hematuria, unspecified: Secondary | ICD-10-CM | POA: Diagnosis present

## 2019-09-07 DIAGNOSIS — Z8249 Family history of ischemic heart disease and other diseases of the circulatory system: Secondary | ICD-10-CM | POA: Diagnosis not present

## 2019-09-07 DIAGNOSIS — W1830XA Fall on same level, unspecified, initial encounter: Secondary | ICD-10-CM | POA: Diagnosis present

## 2019-09-07 DIAGNOSIS — I2510 Atherosclerotic heart disease of native coronary artery without angina pectoris: Secondary | ICD-10-CM | POA: Diagnosis present

## 2019-09-07 DIAGNOSIS — F482 Pseudobulbar affect: Secondary | ICD-10-CM | POA: Diagnosis present

## 2019-09-07 DIAGNOSIS — Z823 Family history of stroke: Secondary | ICD-10-CM | POA: Diagnosis not present

## 2019-09-07 DIAGNOSIS — F17210 Nicotine dependence, cigarettes, uncomplicated: Secondary | ICD-10-CM | POA: Diagnosis present

## 2019-09-07 DIAGNOSIS — F0150 Vascular dementia without behavioral disturbance: Secondary | ICD-10-CM | POA: Diagnosis present

## 2019-09-07 DIAGNOSIS — Z96642 Presence of left artificial hip joint: Secondary | ICD-10-CM | POA: Diagnosis not present

## 2019-09-07 DIAGNOSIS — G9340 Encephalopathy, unspecified: Secondary | ICD-10-CM | POA: Diagnosis not present

## 2019-09-07 DIAGNOSIS — I252 Old myocardial infarction: Secondary | ICD-10-CM | POA: Diagnosis not present

## 2019-09-07 DIAGNOSIS — Z20822 Contact with and (suspected) exposure to covid-19: Secondary | ICD-10-CM | POA: Diagnosis not present

## 2019-09-07 DIAGNOSIS — R296 Repeated falls: Secondary | ICD-10-CM | POA: Diagnosis present

## 2019-09-07 DIAGNOSIS — S72002S Fracture of unspecified part of neck of left femur, sequela: Secondary | ICD-10-CM | POA: Diagnosis not present

## 2019-09-07 DIAGNOSIS — M2550 Pain in unspecified joint: Secondary | ICD-10-CM | POA: Diagnosis not present

## 2019-09-07 DIAGNOSIS — M25552 Pain in left hip: Secondary | ICD-10-CM | POA: Diagnosis not present

## 2019-09-07 DIAGNOSIS — R9431 Abnormal electrocardiogram [ECG] [EKG]: Secondary | ICD-10-CM | POA: Diagnosis not present

## 2019-09-07 DIAGNOSIS — J449 Chronic obstructive pulmonary disease, unspecified: Secondary | ICD-10-CM | POA: Diagnosis not present

## 2019-09-07 DIAGNOSIS — Z66 Do not resuscitate: Secondary | ICD-10-CM | POA: Diagnosis present

## 2019-09-07 LAB — HIV ANTIBODY (ROUTINE TESTING W REFLEX): HIV Screen 4th Generation wRfx: NONREACTIVE

## 2019-09-07 MED ORDER — DOCUSATE SODIUM 100 MG PO CAPS
100.0000 mg | ORAL_CAPSULE | Freq: Two times a day (BID) | ORAL | Status: DC
  Administered 2019-09-07 – 2019-09-08 (×2): 100 mg via ORAL
  Filled 2019-09-07 (×2): qty 1

## 2019-09-07 MED ORDER — POLYETHYLENE GLYCOL 3350 17 G PO PACK: 17.0000 g | PACK | Freq: Every day | ORAL | Status: DC | PRN

## 2019-09-07 MED ORDER — MUPIROCIN 2 % EX OINT
1.0000 "application " | TOPICAL_OINTMENT | Freq: Two times a day (BID) | CUTANEOUS | Status: AC
  Administered 2019-09-07 – 2019-09-12 (×10): 1 via NASAL
  Filled 2019-09-07: qty 22

## 2019-09-07 MED ORDER — METHOCARBAMOL 1000 MG/10ML IJ SOLN
500.0000 mg | Freq: Four times a day (QID) | INTRAVENOUS | Status: DC | PRN
  Filled 2019-09-07 (×3): qty 5

## 2019-09-07 MED ORDER — BUPROPION HCL ER (XL) 150 MG PO TB24
300.0000 mg | ORAL_TABLET | Freq: Every day | ORAL | Status: DC
  Administered 2019-09-10 – 2019-09-14 (×5): 300 mg via ORAL
  Filled 2019-09-07 (×5): qty 2

## 2019-09-07 MED ORDER — MORPHINE SULFATE (PF) 2 MG/ML IV SOLN
0.5000 mg | INTRAVENOUS | Status: DC | PRN
  Administered 2019-09-08 – 2019-09-09 (×4): 0.5 mg via INTRAVENOUS
  Filled 2019-09-07 (×4): qty 1

## 2019-09-07 MED ORDER — METHOCARBAMOL 500 MG PO TABS
500.0000 mg | ORAL_TABLET | Freq: Four times a day (QID) | ORAL | Status: DC | PRN
  Administered 2019-09-07 – 2019-09-09 (×4): 500 mg via ORAL
  Filled 2019-09-07 (×4): qty 1

## 2019-09-07 MED ORDER — BISACODYL 5 MG PO TBEC: 5.0000 mg | DELAYED_RELEASE_TABLET | Freq: Every day | ORAL | Status: DC | PRN

## 2019-09-07 MED ORDER — NICOTINE 14 MG/24HR TD PT24
14.0000 mg | MEDICATED_PATCH | Freq: Every day | TRANSDERMAL | Status: DC
  Administered 2019-09-09 – 2019-09-14 (×3): 14 mg via TRANSDERMAL
  Filled 2019-09-07 (×5): qty 1

## 2019-09-07 MED ORDER — ATORVASTATIN CALCIUM 40 MG PO TABS
40.0000 mg | ORAL_TABLET | Freq: Every day | ORAL | Status: DC
  Administered 2019-09-10 – 2019-09-14 (×5): 40 mg via ORAL
  Filled 2019-09-07 (×5): qty 1

## 2019-09-07 MED ORDER — AMLODIPINE BESYLATE 5 MG PO TABS
5.0000 mg | ORAL_TABLET | Freq: Every day | ORAL | Status: DC
  Administered 2019-09-08 – 2019-09-14 (×6): 5 mg via ORAL
  Filled 2019-09-07 (×6): qty 1

## 2019-09-07 MED ORDER — HYDROCODONE-ACETAMINOPHEN 5-325 MG PO TABS
1.0000 | ORAL_TABLET | Freq: Four times a day (QID) | ORAL | Status: DC | PRN
  Administered 2019-09-07: 2 via ORAL
  Filled 2019-09-07: qty 2

## 2019-09-07 NOTE — H&P (Signed)
History and Physical    GAGE WEANT UMP:[id number] DOB: 06-Sep-1950 DOA: 09/07/2019  PCP: Dettinger, Fransisca Kaufmann, MD Consultants:  None Patient coming from:  Home - lives with wife; NOK: Wife, Montrice Gracey, [phone]  Chief Complaint: Hip fracture  HPI: Walter [name] is a 69 y.o. male with medical history significant of HTN; HLD; CVA (2013); CAD; and COPD presenting as a transfer from [facility] with a hip fracture.  He  [address] down and landed on his hip.  His sister reports that he went to the store and fell trying to get out of his truck.  He laid there for a while and there weren't sure what was going on.  They piled him in a truck and drove him home and then he had to get back in his truck and go to the hospital in [place].  He has been falling a lot since his prior stroke.  Not light headed or dizzy.  He also has persistent dysarthria and intermittent dysphagia since his stroke.  He is "stubborn" and refused to go to rehab following the CVA and appears to have had progressive deficits over time.    ED Course:  [facility] to [facility] transfer, per Dr. Marlowe Sax:  History of hypertension, hyperlipidemia, CVA, and COPD.  Sustained a left femoral neck fracture from a fall.  Hemodynamically stable.  ED provider spoke to Dr. Stann Mainland, orthopedics will consult.   Review of Systems: As per HPI; otherwise review of systems reviewed and negative.   Ambulatory Status:   Ambulates with a walker  COVID Vaccine Status:  None  Past Medical History:  Diagnosis Date  . Acute urinary retention 02/12/2014  . Brain TIA    recurrent   . C. difficile enteritis   . Carotid bruit   . COPD (chronic obstructive pulmonary disease) ([place])   . Coronary artery disease   . Myocardial infarction ([place])    incidental  . Nodule of kidney 02/14/14   solid on left  . Recurrent colitis due to Clostridium difficile 02/11/2014  . Stroke [facility]) 1997    Past Surgical History:  Procedure Laterality Date  . BACK SURGERY    . KNEE SURGERY       Social History   Socioeconomic History  . Marital status: Married    Spouse name: Not on file  . Number of children: 1  . Years of education: 77  . Highest education level: 10th grade  Occupational History  . Occupation: truck Geophysicist/field seismologist    Comment: unable becasue of CVA  . Occupation: disability    Comment: since 2000  Tobacco Use  . Smoking status: Current Every Day Smoker    Packs/day: 1.00    Years: 55.00    Pack years: 55.00    Types: Cigarettes  . Smokeless tobacco: Never Used  Substance and Sexual Activity  . Alcohol use: No  . Drug use: No  . Sexual activity: Not Currently  Other Topics Concern  . Not on file  Social History Narrative  . Not on file   Social Determinants of Health   Financial Resource Strain:   . Difficulty of Paying Living Expenses:   Food Insecurity:   . Worried About Charity fundraiser in the Last Year:   . Arboriculturist in the Last Year:   Transportation Needs:   . Film/video editor (Medical):   [name] Lack of Transportation (Non-Medical):   Physical Activity:   . Days of Exercise per Week:   .  Minutes of Exercise per Session:   Stress:   . Feeling of Stress :   Social Connections:   . Frequency of Communication with Friends and Family:   . Frequency of Social Gatherings with Friends and Family:   . Attends Religious Services:   . Active Member of Clubs or Organizations:   . Attends Archivist Meetings:   [name] Marital Status:   Intimate Partner Violence:   . Fear of Current or Ex-Partner:   . Emotionally Abused:   [name] Physically Abused:   . Sexually Abused:     No Known Allergies  Family History  Problem Relation Age of Onset  . Cancer Father 5       stomach  . Coronary artery disease Mother 69  . Stroke Mother   . Coronary artery disease Brother 19       AMI deceased  . Heart attack Daughter     Prior to Admission medications   Medication Sig Start Date End Date Taking? Authorizing Provider  amLODipine  (NORVASC) 5 MG tablet TAKE 1 TABLET EVERY DAY Patient taking differently: Take 5 mg by mouth daily.  07/19/19  Yes Dettinger, Fransisca Kaufmann, MD  atorvastatin (LIPITOR) 40 MG tablet Take 1 tablet (40 mg total) by mouth daily. 07/09/18  Yes Dettinger, Fransisca Kaufmann, MD  buPROPion (WELLBUTRIN XL) 300 MG 24 hr tablet Take 1 tablet (300 mg total) by mouth daily. 04/19/19  Yes Dettinger, Fransisca Kaufmann, MD  clopidogrel (PLAVIX) 75 MG tablet Take 1 tablet (75 mg total) by mouth daily. 07/09/18  Yes Dettinger, Fransisca Kaufmann, MD  terbinafine (LAMISIL) 250 MG tablet Take 1 tablet (250 mg total) by mouth daily. Patient not taking: Reported on 09/07/2019 04/12/19   Dettinger, Fransisca Kaufmann, MD    Physical Exam: Vitals:   09/07/19 1400  BP: (!) 156/86  Pulse: (!) 102  Resp: 16  Temp: 98.1 F (36.7 C)  TempSrc: Oral  SpO2: 98%     . General:  Appears calm and comfortable and is NAD but intermittently emotionally labile with outbursts and apparent pain . Eyes:  EOMI, normal lids, iris . ENT:  grossly normal hearing, lips & tongue, mmm . Neck:  no LAD, masses or thyromegaly . Cardiovascular:  RR with mild tachycardia, no m/r/g. No LE edema.  [name] Respiratory:   CTA bilaterally with no wheezes/rales/rhonchi.  Normal respiratory effort. . Abdomen:  soft, NT, ND, NABS . Skin:  Small abrasion on left hypothenar eminence . Musculoskeletal:  Left leg shortening without obvious external rotation . Lower extremity:  No LE edema.  Limited foot exam with no ulcerations.  2+ distal pulses. [name] Psychiatric:  grossly normal mood and affect, speech dysarthric, intermittently emotionally labile . Neurologic:  CN 2-12 grossly intact, moves all extremities in coordinated fashion other than LLE    Rad[facility] at [facility]:   Hip xray: Acute fracture of the proximal left femur.    CXR: FINDINGS:  Mild, stable, diffuse chronic appearing increased lung markings are  noted without evidence of acute infiltrate, pleural effusion or    pneumothorax. The heart size and mediastinal contours are within  normal limits. The visualized skeletal structures are unremarkable.  No active disease.     EKG: pending   Labs on Admission: I have personally reviewed the available labs and imaging studies at the time of the admission.  Pertinent labs:   Unremarkable CMP WBC 13.4 COVID negative   Assessment/Plan Principal Problem:   Hip fracture (HCC) Active Problems:  History of CVA (cerebrovascular accident)   Tobacco abuse   COPD (chronic obstructive pulmonary disease) (HCC)   HTN (hypertension)   Depression, recurrent (HCC)   Hyperlipidemia, mixed   Hip fracture -Mechanical fall resulting in hip fracture -Orthopedics consult in AM -NPO after midnight in anticipation of surgical repair tomorrow -SCDs overnight, start Lovenox post-operatively (or as per ortho) -Hold Plavix - last dose was 6/4 PM -Pain control with Robxain, Vicodin, and Morphine prn -SW consult for rehab placement; given other deficits (see below), he appears to be a candidate for CIR and this would be ideal -Will need PT consult post-operatively -Hip fracture order set utilized  H/o CVA -Patient with remote h/o CVA but persistent dysarthria, dysphagia, and weakness resulting in frequent falls -Will need PT/OT ST consults post-operatively to determine best placement - ideally CIR if possible -Hold Plavix pre-operatively  HTN -Continue Norvasc  HLD -Continue Lipitor  Depression -Continue Wellbutrin -Patient with intermittent emotional lability throughout evaluation, may benefit from psych consult if this does not improve with pain control post-operatively  COPD with ongoing tobacco dependence -No home O2 -He appears to not be taking any medication for this issue at this time -Tobacco Dependence: encourage cessation.   -Patch ordered    Note: This patient has been tested and is negative for the novel coronavirus COVID-19.   DVT  prophylaxis:  SCDs until approved for Lovenox by orthopedics Code Status:  DNR - confirmed with patient/family Family Communication: Sister was present throughout evaluation Disposition Plan:  Home once clinically improved Consults called: Orthopedics; TOC team, Nutrition; will need PT/OT/ST post-operatively  Admission status: Admit - It is my clinical opinion that admission to INPATIENT is reasonable and necessary because of the expectation that this patient will require hospital care that crosses at least 2 midnights to treat this condition based on the medical complexity of the problems presented.  Given the aforementioned information, the predictability of an adverse outcome is felt to be significant.      Karmen Bongo MD [facility]   How to contact the [facility] Attending or Consulting provider [place] or covering provider during after hours [facility], for this patient?  1. Check the care team in [facility] and look for a) attending/consulting TRH provider listed and b) the [facility] team listed 2. Log into [email or web address] and use [place]'s universal password to access. If you do not have the password, please contact the hospital operator. 3. Locate the [facility] provider you are looking for under [facility] and page to a number that you can be directly reached. 4. If you still have difficulty reaching the provider, please page the [facility] (Director on Call) for the Hospitalists listed on amion for assistance.   09/07/2019, 4:11 PM

## 2019-09-07 NOTE — Plan of Care (Signed)
  Problem: Pain Managment: Goal: General experience of comfort will improve Outcome: Progressing   Problem: Safety: Goal: Ability to remain free from injury will improve Outcome: Progressing   Problem: Skin Integrity: Goal: Risk for impaired skin integrity will decrease Outcome: Progressing   Problem: Activity: Goal: Ability to ambulate and perform ADLs will improve Outcome: Progressing

## 2019-09-08 ENCOUNTER — Inpatient Hospital Stay (HOSPITAL_COMMUNITY): Payer: Medicare HMO

## 2019-09-08 DIAGNOSIS — Z720 Tobacco use: Secondary | ICD-10-CM

## 2019-09-08 DIAGNOSIS — Z66 Do not resuscitate: Secondary | ICD-10-CM | POA: Diagnosis present

## 2019-09-08 LAB — SURGICAL PCR SCREEN
MRSA, PCR: NEGATIVE
Staphylococcus aureus: POSITIVE — AB

## 2019-09-08 LAB — CBC
HCT: 48.9 % (ref 39.0–52.0)
Hemoglobin: 16 g/dL (ref 13.0–17.0)
MCH: 30 pg (ref 26.0–34.0)
MCHC: 32.7 g/dL (ref 30.0–36.0)
MCV: 91.7 fL (ref 80.0–100.0)
Platelets: 271 K/uL (ref 150–400)
RBC: 5.33 MIL/uL (ref 4.22–5.81)
RDW: 13.2 % (ref 11.5–15.5)
WBC: 12.6 K/uL — ABNORMAL HIGH (ref 4.0–10.5)
nRBC: 0 % (ref 0.0–0.2)

## 2019-09-08 LAB — BASIC METABOLIC PANEL WITH GFR
Anion gap: 14 (ref 5–15)
BUN: 18 mg/dL (ref 8–23)
CO2: 19 mmol/L — ABNORMAL LOW (ref 22–32)
Calcium: 8.9 mg/dL (ref 8.9–10.3)
Chloride: 104 mmol/L (ref 98–111)
Creatinine, Ser: 1 mg/dL (ref 0.61–1.24)
GFR calc Af Amer: >60 mL/min
GFR calc non Af Amer: >60 mL/min
Glucose, Bld: 111 mg/dL — ABNORMAL HIGH (ref 70–99)
Potassium: 4 mmol/L (ref 3.5–5.1)
Sodium: 137 mmol/L (ref 135–145)

## 2019-09-08 MED ORDER — TRANEXAMIC ACID-NACL 1000-0.7 MG/100ML-% IV SOLN
1000.0000 mg | INTRAVENOUS | Status: AC
  Administered 2019-09-09: 1000 mg via INTRAVENOUS
  Filled 2019-09-08: qty 100

## 2019-09-08 MED ORDER — POVIDONE-IODINE 10 % EX SWAB: 2.0000 "application " | Freq: Once | CUTANEOUS | Status: DC

## 2019-09-08 MED ORDER — CEFAZOLIN SODIUM-DEXTROSE 2-4 GM/100ML-% IV SOLN
2.0000 g | INTRAVENOUS | Status: AC
  Administered 2019-09-09: 2 g via INTRAVENOUS
  Filled 2019-09-08: qty 100

## 2019-09-08 MED ORDER — CHLORHEXIDINE GLUCONATE CLOTH 2 % EX PADS
6.0000 | MEDICATED_PAD | Freq: Every day | CUTANEOUS | Status: DC
  Administered 2019-09-08 – 2019-09-13 (×5): 6 via TOPICAL

## 2019-09-08 MED ORDER — CHLORHEXIDINE GLUCONATE 4 % EX LIQD
60.0000 mL | Freq: Once | CUTANEOUS | Status: AC
  Administered 2019-09-09: 4 via TOPICAL

## 2019-09-08 MED ORDER — MORPHINE SULFATE 10 MG/ML IJ SOLN
4.00 | INTRAMUSCULAR | Status: DC
Start: ? — End: 2019-09-08

## 2019-09-08 NOTE — Consult Note (Addendum)
ORTHOPAEDIC CONSULTATION  REQUESTING PHYSICIAN: Geradine Girt, DO  PCP:  Dettinger, Fransisca Kaufmann, MD  Chief Complaint: Left hip pain  HPI: Walter [name] is a 69 y.o. male who presented to [facility] in transfer from [facility] on 09/07/19 with left hip fracture. Per family history and chart review, he was at the store on Friday, 09/06/19, when he fell getting into his truck. He was unable to get up, and a passerby helped him into his truck and drove him to his home. His family then took him to [place] at [facility] revealed left femoral neck fracture. He was then transferred to [facility] per Dr. Marlowe Sax. [facility] was consulted for orthopaedic management.    Today, on exam, the patient is resting in bed with family at the bedside. His family reports he has a history of multiple strokes which have resulted in some cognitive and physical impairment. He does have frequent falls at home, but ambulates without assistive devices. He is on Plavix 75 mg daily with last dose on 09/06/19 in the PM, and has reported history of MI, CVA/TIA, and carotid stenosis. His wife states he is a smoker.   The patient is responsive to some questions with yes/no answers, and does locate himself to the hospital.    Past Medical History:  Diagnosis Date  . Acute urinary retention 02/12/2014  . Brain TIA    recurrent   . C. difficile enteritis   . Carotid bruit   . COPD (chronic obstructive pulmonary disease) ([place])   . Coronary artery disease   . Myocardial infarction ([place])    incidental  . Nodule of kidney 02/14/14   solid on left  . Recurrent colitis due to Clostridium difficile 02/11/2014  . Stroke [facility]) 1997   Past Surgical History:  Procedure Laterality Date  . BACK SURGERY    . KNEE SURGERY     Social History   Socioeconomic History  . Marital status: Married    Spouse name: Not on file  . Number of children: 1  . Years of education: 60  . Highest education level: 10th grade    Occupational History  . Occupation: truck Geophysicist/field seismologist    Comment: unable becasue of CVA  . Occupation: disability    Comment: since 2000  Tobacco Use  . Smoking status: Current Every Day Smoker    Packs/day: 1.00    Years: 55.00    Pack years: 55.00    Types: Cigarettes  . Smokeless tobacco: Never Used  Substance and Sexual Activity  . Alcohol use: No  . Drug use: No  . Sexual activity: Not Currently  Other Topics Concern  . Not on file  Social History Narrative  . Not on file   Social Determinants of Health   Financial Resource Strain:   . Difficulty of Paying Living Expenses:   Food Insecurity:   . Worried About Charity fundraiser in the Last Year:   . Arboriculturist in the Last Year:   Transportation Needs:   . Film/video editor (Medical):   [name] Lack of Transportation (Non-Medical):   Physical Activity:   . Days of Exercise per Week:   . Minutes of Exercise per Session:   Stress:   . Feeling of Stress :   Social Connections:   . Frequency of Communication with Friends and Family:   . Frequency of Social Gatherings with Friends and Family:   . Attends Religious Services:   .  Active Member of Clubs or Organizations:   . Attends Archivist Meetings:   [name] Marital Status:    Family History  Problem Relation Age of Onset  . Cancer Father 55       stomach  . Coronary artery disease Mother 63  . Stroke Mother   . Coronary artery disease Brother 68       AMI deceased  . Heart attack Daughter    No Known Allergies Prior to Admission medications   Medication Sig Start Date End Date Taking? Authorizing Provider  amLODipine (NORVASC) 5 MG tablet TAKE 1 TABLET EVERY DAY Patient taking differently: Take 5 mg by mouth daily.  07/19/19  Yes Dettinger, Fransisca Kaufmann, MD  atorvastatin (LIPITOR) 40 MG tablet Take 1 tablet (40 mg total) by mouth daily. 07/09/18  Yes Dettinger, Fransisca Kaufmann, MD  buPROPion (WELLBUTRIN XL) 300 MG 24 hr tablet Take 1 tablet (300 mg total) by  mouth daily. 04/19/19  Yes Dettinger, Fransisca Kaufmann, MD  clopidogrel (PLAVIX) 75 MG tablet Take 1 tablet (75 mg total) by mouth daily. 07/09/18  Yes Dettinger, Fransisca Kaufmann, MD   No results found.  Positive ROS: All other systems have been reviewed and were otherwise negative with the exception of those mentioned in the HPI and as above.  Physical Exam: General: Alert, no acute distress Cardiovascular: No pedal edema Respiratory: No cyanosis, no use of accessory musculature Skin: No lesions in the area of chief complaint Neurologic: Sensation intact distally Psychiatric: Patient appears pleasantly confused. Oriented to location. Lymphatic: No axillary or cervical lymphadenopathy  MUSCULOSKELETAL:  Left Hip Exam: LLE shortened and externally rotated. Skin intact about the left hip. Mild ecchymosis about the hip. Distal pulses and sensation intact. Patient cognitive status prevents good assessment of lower extremity motor function. He appears reluctant to move the LLE secondary to pain.  Assessment: Closed left femoral neck fracture  Plan: There was some miscommunication in regard to orthopaedic consultation. Dr. Stann Mainland was consulted on Friday, but informed the caller that he would not be on call for the date of transfer to [facility]. Unfortunately, the appropriate consultant was not contacted yesterday, and therefore we are seeing him this morning. I discussed with Dr. Eliseo Squires this morning, and appreciate her assistance.  I discussed with the family today that he will require surgical intervention for his hip fracture, likely with a left total hip arthroplasty. We will plan for surgery today vs tomorrow depending on surgeon availability. He will remain NPO at this point in case of possible surgery this afternoon/evening. We will continue to hold Plavix at this point.    I am unable to view the radiographs obtained at [facility], and will repeat a portable AP/lateral pelvis and left hip XR for surgical  planning.   Maurice March, PA-C Cell [phone]   09/08/2019 9:42 AM

## 2019-09-08 NOTE — Progress Notes (Signed)
Progress Note    Walter [name]  VQX:[id number] DOB: [birth year]  DOA: 09/07/2019 PCP: Dettinger, Fransisca Kaufmann, MD    Brief Narrative:     Medical records reviewed and are as summarized below:  KHYSON [name] is an 69 y.o. male with medical history significant of HTN; HLD; CVA (2013); CAD; and COPD presenting as a transfer from [facility] with a hip fracture.  He  [address] down and landed on his hip.  His sister reports that he went to the store and fell trying to get out of his truck.  He laid there for a while and there weren't sure what was going on.  They piled him in a truck and drove him home and then he had to get back in his truck and go to the hospital in [place].  He has been falling a lot since his prior stroke.  Not light headed or dizzy.  He also has persistent dysarthria and intermittent dysphagia since his stroke.  He is "stubborn" and refused to go to rehab following the CVA and appears to have had progressive deficits over time.  Assessment/Plan:   Principal Problem:   Hip fracture (HCC) Active Problems:   History of CVA (cerebrovascular accident)   Tobacco abuse   COPD (chronic obstructive pulmonary disease) (HCC)   HTN (hypertension)   Depression, recurrent (HCC)   Hyperlipidemia, mixed   Hip fracture -Mechanical fall resulting in hip fracture -Dr. Ihor Gully to see patient but most likely surgery would not be until tomorrow morning, patient to remain n.p.o. in case surgery can be done later today -SCDs overnight, start Lovenox post-operatively (or as per ortho) -Hold Plavix - last dose was 6/4 PM -Pain control with Robxain, Vicodin, and Morphine prn -SW consult for rehab placement -Will need PT consult post-operatively -Hip fracture order set utilized  H/o CVA -Patient with remote h/o CVA but persistent dysarthria, dysphagia, and weakness resulting in frequent falls -Will need PT/OT ST consults post-operatively to determine best placement  -Hold Plavix  pre-operatively  HTN -Continue Norvasc  HLD -Continue Lipitor  Depression -Continue Wellbutrin  COPD with ongoing tobacco dependence -No home O2 -He appears to not be taking any medication for this issue at this time -Tobacco Dependence: encourage cessation.   -Patch ordered     Family Communication/Anticipated D/C date and plan/Code Status   DVT prophylaxis: Lovenox ordered. Code Status: dnr Disposition Plan: Status is: Inpatient  Remains inpatient appropriate because:Ongoing diagnostic testing needed not appropriate for outpatient work up   Dispo: The patient is from: Home              Anticipated d/c is to: SNF              Anticipated d/c date is: > 3 days              Patient currently is not medically stable to d/c.         Medical Consultants:    Orthopedics (I called the on-call physician Dr. Alvan Dame)  Subjective:   Having spasms in his left hip  Objective:    Vitals:   09/07/19 2030 09/08/19 0011 09/08/19 0409 09/08/19 0735  BP:  126/86 131/76 (!) 144/92  Pulse:  96 96 99  Resp:  16 16 16   Temp:  97.9 F (36.6 C) 98.2 F (36.8 C) 98.7 F (37.1 C)  TempSrc:  Oral Oral Oral  SpO2: 92% 92% 95% 94%  Weight:      Height:  Intake/Output Summary (Last 24 hours) at 09/08/2019 1013 Last data filed at 09/08/2019 0300 Gross per 24 hour  Intake --  Output 1800 ml  Net -1800 ml   Filed Weights   09/07/19 1615  Weight: 80 kg    Exam:  General: Appearance:   Chronically ill-appearing male in no acute distress     Lungs:     Clear to auscultation bilaterally, respirations unlabored  Heart:    Normal heart rate. Normal rhythm. No murmurs, rubs, or gallops.   MS:  Pain and spasms in left hip  Neurologic:  Speech is dysarthric    Data Reviewed:   I have personally reviewed following labs and imaging studies:  Labs: Labs show the following:   Basic Metabolic Panel: Recent Labs  Lab 09/08/19 0304  NA 137  K 4.0  CL 104  CO2  19*  GLUCOSE 111*  BUN 18  CREATININE 1.00  CALCIUM 8.9   GFR Estimated Creatinine Clearance: 77.7 mL/min (by C-G formula based on SCr of 1 mg/dL). Liver Function Tests: No results for input(s): AST, ALT, ALKPHOS, BILITOT, PROT, ALBUMIN in the last 168 hours. No results for input(s): LIPASE, AMYLASE in the last 168 hours. No results for input(s): AMMONIA in the last 168 hours. Coagulation profile No results for input(s): INR, PROTIME in the last 168 hours.  CBC: Recent Labs  Lab 09/08/19 0304  WBC 12.6*  HGB 16.0  HCT 48.9  MCV 91.7  PLT 271   Cardiac Enzymes: No results for input(s): CKTOTAL, CKMB, CKMBINDEX, TROPONINI in the last 168 hours. BNP (last 3 results) No results for input(s): PROBNP in the last 8760 hours. CBG: No results for input(s): GLUCAP in the last 168 hours. D-Dimer: No results for input(s): DDIMER in the last 72 hours. Hgb A1c: No results for input(s): HGBA1C in the last 72 hours. Lipid Profile: No results for input(s): CHOL, HDL, LDLCALC, TRIG, CHOLHDL, LDLDIRECT in the last 72 hours. Thyroid function studies: No results for input(s): TSH, T4TOTAL, T3FREE, THYROIDAB in the last 72 hours.  Invalid input(s): FREET3 Anemia work up: No results for input(s): VITAMINB12, FOLATE, FERRITIN, TIBC, IRON, RETICCTPCT in the last 72 hours. Sepsis Labs: Recent Labs  Lab 09/08/19 0304  WBC 12.6*    Microbiology Recent Results (from the past 240 hour(s))  Surgical PCR screen     Status: Abnormal   Collection Time: 09/07/19  4:22 PM   Specimen: Nasal Mucosa; Nasal Swab  Result Value Ref Range Status   MRSA, PCR NEGATIVE NEGATIVE Final   Staphylococcus aureus POSITIVE (A) NEGATIVE Final    Comment: CRITICAL RESULT CALLED TO, READ BACK BY AND VERIFIED WITH: RN, PAM Alfonso Patten @0521  [id number] THANEY Performed at [facility], [place] [address], [place], [place] [address]     Procedures and diagnostic studies:  No results found.  Medications:   .  amLODipine  5 mg Oral Daily  . atorvastatin  40 mg Oral Daily  . buPROPion  300 mg Oral Daily  . Chlorhexidine Gluconate Cloth  6 each Topical Daily  . docusate sodium  100 mg Oral BID  . mupirocin ointment  1 application Nasal BID  . nicotine  14 mg Transdermal Daily   Continuous Infusions: . methocarbamol (ROBAXIN) IV       LOS: 1 day   Geradine Girt  [facility]   How to contact the [facility] Attending or Consulting provider [place] or covering provider during after hours [place], for this patient?  1. Check the care team in [facility] and look for a) attending/consulting TRH provider listed and b) the [facility] team listed 2. Log into [email or web address] and use [place]'s universal password to access. If you do not have the password, please contact the hospital operator. 3. Locate the [facility] provider you are looking for under [facility] and page to a number that you can be directly reached. 4. If you still have difficulty reaching the provider, please page the [facility] (Director on Call) for the Hospitalists listed on amion for assistance.  09/08/2019, 10:13 AM

## 2019-09-08 NOTE — Progress Notes (Signed)
Orthopedic Tech Progress Note Patient Details:  Walter [name] [birth year] [id number]  Musculoskeletal Traction Type of Traction: [place] Skin Traction Traction Location: left Traction Weight: 10 lbs   Post Interventions Patient Tolerated: Well Instructions Provided: Other (comment)   [name] 09/08/2019, 2:39 PM

## 2019-09-09 ENCOUNTER — Inpatient Hospital Stay (HOSPITAL_COMMUNITY): Payer: Medicare HMO

## 2019-09-09 ENCOUNTER — Inpatient Hospital Stay (HOSPITAL_COMMUNITY): Payer: Medicare HMO | Admitting: Anesthesiology

## 2019-09-09 ENCOUNTER — Encounter (HOSPITAL_COMMUNITY): Admission: AD | Disposition: A | Discharge status: Home Health | Payer: Self-pay | Attending: Internal Medicine

## 2019-09-09 DIAGNOSIS — Z66 Do not resuscitate: Secondary | ICD-10-CM

## 2019-09-09 DIAGNOSIS — S72002S Fracture of unspecified part of neck of left femur, sequela: Secondary | ICD-10-CM

## 2019-09-09 DIAGNOSIS — Z96649 Presence of unspecified artificial hip joint: Secondary | ICD-10-CM

## 2019-09-09 DIAGNOSIS — I10 Essential (primary) hypertension: Secondary | ICD-10-CM

## 2019-09-09 HISTORY — PX: HIP ARTHROPLASTY: SHX981

## 2019-09-09 LAB — BASIC METABOLIC PANEL
Anion gap: 12 (ref 5–15)
BUN: 18 mg/dL (ref 8–23)
CO2: 22 mmol/L (ref 22–32)
Calcium: 8.9 mg/dL (ref 8.9–10.3)
Chloride: 104 mmol/L (ref 98–111)
Creatinine, Ser: 0.95 mg/dL (ref 0.61–1.24)
GFR calc Af Amer: >60 mL/min (ref >60)
GFR calc non Af Amer: >60 mL/min (ref >60)
Glucose, Bld: 108 mg/dL — ABNORMAL HIGH (ref 70–99)
Potassium: 3.6 mmol/L (ref 3.5–5.1)
Sodium: 138 mmol/L (ref 135–145)

## 2019-09-09 LAB — CBC
HCT: 48.2 % (ref 39.0–52.0)
Hemoglobin: 15.7 g/dL (ref 13.0–17.0)
MCH: 29.7 pg (ref 26.0–34.0)
MCHC: 32.6 g/dL (ref 30.0–36.0)
MCV: 91.3 fL (ref 80.0–100.0)
Platelets: 282 10*3/uL (ref 150–400)
RBC: 5.28 MIL/uL (ref 4.22–5.81)
RDW: 13.1 % (ref 11.5–15.5)
WBC: 14.2 10*3/uL — ABNORMAL HIGH (ref 4.0–10.5)
nRBC: 0 % (ref 0.0–0.2)

## 2019-09-09 LAB — SARS CORONAVIRUS 2 (TAT 6-24 HRS): SARS Coronavirus 2: NEGATIVE

## 2019-09-09 SURGERY — HEMIARTHROPLASTY, HIP, DIRECT ANTERIOR APPROACH, FOR FRACTURE
Anesthesia: General | Site: Hip | Laterality: Left

## 2019-09-09 MED ORDER — HYDROCODONE-ACETAMINOPHEN 5-325 MG PO TABS: 1.0000 | ORAL_TABLET | ORAL | Status: DC | PRN

## 2019-09-09 MED ORDER — CLOPIDOGREL BISULFATE 75 MG PO TABS
75.0000 mg | ORAL_TABLET | Freq: Every day | ORAL | Status: DC
  Administered 2019-09-10 – 2019-09-13 (×4): 75 mg via ORAL
  Filled 2019-09-09 (×4): qty 1

## 2019-09-09 MED ORDER — MENTHOL 3 MG MT LOZG: 1.0000 | LOZENGE | OROMUCOSAL | Status: DC | PRN

## 2019-09-09 MED ORDER — ONDANSETRON HCL 4 MG/2ML IJ SOLN
INTRAMUSCULAR | Status: DC | PRN
  Administered 2019-09-09: 4 mg via INTRAVENOUS

## 2019-09-09 MED ORDER — POLYETHYLENE GLYCOL 3350 17 G PO PACK
17.0000 g | PACK | Freq: Two times a day (BID) | ORAL | Status: DC
  Administered 2019-09-09 – 2019-09-14 (×5): 17 g via ORAL
  Filled 2019-09-09 (×6): qty 1

## 2019-09-09 MED ORDER — LIDOCAINE 2% (20 MG/ML) 5 ML SYRINGE
INTRAMUSCULAR | Status: DC | PRN
  Administered 2019-09-09: 100 mg via INTRAVENOUS

## 2019-09-09 MED ORDER — MAGNESIUM CITRATE PO SOLN: 1.0000 | Freq: Once | ORAL | Status: DC | PRN

## 2019-09-09 MED ORDER — ADULT MULTIVITAMIN W/MINERALS CH
1.0000 | ORAL_TABLET | Freq: Every day | ORAL | Status: DC
  Administered 2019-09-10 – 2019-09-14 (×5): 1 via ORAL
  Filled 2019-09-09 (×5): qty 1

## 2019-09-09 MED ORDER — METHOCARBAMOL 1000 MG/10ML IJ SOLN
500.0000 mg | Freq: Four times a day (QID) | INTRAVENOUS | Status: DC | PRN
  Filled 2019-09-09: qty 5

## 2019-09-09 MED ORDER — HYDROMORPHONE HCL 1 MG/ML IJ SOLN: 0.2500 mg | INTRAMUSCULAR | Status: DC | PRN

## 2019-09-09 MED ORDER — ACETAMINOPHEN 325 MG PO TABS
325.0000 mg | ORAL_TABLET | Freq: Four times a day (QID) | ORAL | Status: DC | PRN
  Administered 2019-09-11: 650 mg via ORAL
  Filled 2019-09-09: qty 2

## 2019-09-09 MED ORDER — FENTANYL CITRATE (PF) 250 MCG/5ML IJ SOLN
INTRAMUSCULAR | Status: DC | PRN
  Administered 2019-09-09: 100 ug via INTRAVENOUS
  Administered 2019-09-09: 50 ug via INTRAVENOUS

## 2019-09-09 MED ORDER — METOCLOPRAMIDE HCL 5 MG PO TABS: 5.0000 mg | ORAL_TABLET | Freq: Three times a day (TID) | ORAL | Status: DC | PRN

## 2019-09-09 MED ORDER — ROCURONIUM BROMIDE 10 MG/ML (PF) SYRINGE
PREFILLED_SYRINGE | INTRAVENOUS | Status: DC | PRN
  Administered 2019-09-09: 50 mg via INTRAVENOUS

## 2019-09-09 MED ORDER — LACTATED RINGERS IV SOLN: INTRAVENOUS | Status: DC

## 2019-09-09 MED ORDER — LIDOCAINE 2% (20 MG/ML) 5 ML SYRINGE
INTRAMUSCULAR | Status: AC
  Filled 2019-09-09: qty 10

## 2019-09-09 MED ORDER — ENSURE PRE-SURGERY PO LIQD
296.0000 mL | Freq: Once | ORAL | Status: AC
  Administered 2019-09-09: 296 mL via ORAL
  Filled 2019-09-09: qty 296

## 2019-09-09 MED ORDER — DIPHENHYDRAMINE HCL 12.5 MG/5ML PO ELIX: 12.5000 mg | ORAL_SOLUTION | ORAL | Status: DC | PRN

## 2019-09-09 MED ORDER — METOCLOPRAMIDE HCL 5 MG/ML IJ SOLN: 5.0000 mg | Freq: Three times a day (TID) | INTRAMUSCULAR | Status: DC | PRN

## 2019-09-09 MED ORDER — DOCUSATE SODIUM 100 MG PO CAPS
100.0000 mg | ORAL_CAPSULE | Freq: Two times a day (BID) | ORAL | Status: DC
  Administered 2019-09-09 – 2019-09-14 (×6): 100 mg via ORAL
  Filled 2019-09-09 (×8): qty 1

## 2019-09-09 MED ORDER — SUCCINYLCHOLINE CHLORIDE 200 MG/10ML IV SOSY
PREFILLED_SYRINGE | INTRAVENOUS | Status: DC | PRN
Start: 2019-09-09 — End: 2019-09-09
  Administered 2019-09-09: 100 mg via INTRAVENOUS

## 2019-09-09 MED ORDER — ALUM & MAG HYDROXIDE-SIMETH 200-200-20 MG/5ML PO SUSP: 15.0000 mL | ORAL | Status: DC | PRN

## 2019-09-09 MED ORDER — CHLORHEXIDINE GLUCONATE 0.12 % MT SOLN
15.0000 mL | Freq: Once | OROMUCOSAL | Status: AC
  Filled 2019-09-09: qty 15

## 2019-09-09 MED ORDER — ASPIRIN 81 MG PO CHEW
81.0000 mg | CHEWABLE_TABLET | Freq: Two times a day (BID) | ORAL | Status: DC
  Administered 2019-09-09 – 2019-09-13 (×8): 81 mg via ORAL
  Filled 2019-09-09 (×8): qty 1

## 2019-09-09 MED ORDER — MORPHINE SULFATE (PF) 2 MG/ML IV SOLN
0.5000 mg | INTRAVENOUS | Status: DC | PRN
  Administered 2019-09-09: 1 mg via INTRAVENOUS
  Filled 2019-09-09: qty 1

## 2019-09-09 MED ORDER — METHOCARBAMOL 500 MG PO TABS
500.0000 mg | ORAL_TABLET | Freq: Four times a day (QID) | ORAL | Status: DC | PRN
  Administered 2019-09-09: 500 mg via ORAL
  Filled 2019-09-09: qty 1

## 2019-09-09 MED ORDER — TRANEXAMIC ACID-NACL 1000-0.7 MG/100ML-% IV SOLN
1000.0000 mg | Freq: Once | INTRAVENOUS | Status: AC
  Administered 2019-09-09: 1000 mg via INTRAVENOUS
  Filled 2019-09-09: qty 100

## 2019-09-09 MED ORDER — OXYCODONE HCL 5 MG PO TABS: 5.0000 mg | ORAL_TABLET | Freq: Once | ORAL | Status: DC | PRN

## 2019-09-09 MED ORDER — PROMETHAZINE HCL 25 MG/ML IJ SOLN: 6.2500 mg | INTRAMUSCULAR | Status: DC | PRN

## 2019-09-09 MED ORDER — ONDANSETRON HCL 4 MG PO TABS: 4.0000 mg | ORAL_TABLET | Freq: Four times a day (QID) | ORAL | Status: DC | PRN

## 2019-09-09 MED ORDER — DEXAMETHASONE SODIUM PHOSPHATE 10 MG/ML IJ SOLN
10.0000 mg | Freq: Once | INTRAMUSCULAR | Status: AC
  Administered 2019-09-10: 10 mg via INTRAVENOUS
  Filled 2019-09-09: qty 1

## 2019-09-09 MED ORDER — SODIUM CHLORIDE 0.9 % IV SOLN: INTRAVENOUS | Status: DC

## 2019-09-09 MED ORDER — PROPOFOL 10 MG/ML IV BOLUS
INTRAVENOUS | Status: DC | PRN
  Administered 2019-09-09: 100 mg via INTRAVENOUS

## 2019-09-09 MED ORDER — PHENYLEPHRINE 40 MCG/ML (10ML) SYRINGE FOR IV PUSH (FOR BLOOD PRESSURE SUPPORT)
PREFILLED_SYRINGE | INTRAVENOUS | Status: DC | PRN
  Administered 2019-09-09: 80 ug via INTRAVENOUS
  Administered 2019-09-09: 40 ug via INTRAVENOUS
  Administered 2019-09-09: 80 ug via INTRAVENOUS
  Administered 2019-09-09: 120 ug via INTRAVENOUS
  Administered 2019-09-09 (×2): 80 ug via INTRAVENOUS

## 2019-09-09 MED ORDER — FERROUS SULFATE 325 (65 FE) MG PO TABS
325.0000 mg | ORAL_TABLET | Freq: Three times a day (TID) | ORAL | Status: DC
  Administered 2019-09-10 – 2019-09-14 (×13): 325 mg via ORAL
  Filled 2019-09-09 (×14): qty 1

## 2019-09-09 MED ORDER — PHENYLEPHRINE 40 MCG/ML (10ML) SYRINGE FOR IV PUSH (FOR BLOOD PRESSURE SUPPORT)
PREFILLED_SYRINGE | INTRAVENOUS | Status: AC
  Filled 2019-09-09: qty 20

## 2019-09-09 MED ORDER — FENTANYL CITRATE (PF) 250 MCG/5ML IJ SOLN
INTRAMUSCULAR | Status: AC
  Filled 2019-09-09: qty 5

## 2019-09-09 MED ORDER — DEXAMETHASONE SODIUM PHOSPHATE 10 MG/ML IJ SOLN
INTRAMUSCULAR | Status: DC | PRN
  Administered 2019-09-09: 4 mg via INTRAVENOUS

## 2019-09-09 MED ORDER — PHENOL 1.4 % MT LIQD: 1.0000 | OROMUCOSAL | Status: DC | PRN

## 2019-09-09 MED ORDER — SODIUM CHLORIDE 0.9 % IR SOLN
Status: DC | PRN
  Administered 2019-09-09: 1000 mL

## 2019-09-09 MED ORDER — PHENYLEPHRINE 40 MCG/ML (10ML) SYRINGE FOR IV PUSH (FOR BLOOD PRESSURE SUPPORT)
PREFILLED_SYRINGE | INTRAVENOUS | Status: AC
  Filled 2019-09-09: qty 10

## 2019-09-09 MED ORDER — ENSURE ENLIVE PO LIQD
237.0000 mL | Freq: Two times a day (BID) | ORAL | Status: DC
  Administered 2019-09-10 – 2019-09-14 (×2): 237 mL via ORAL

## 2019-09-09 MED ORDER — HYDROCODONE-ACETAMINOPHEN 7.5-325 MG PO TABS
1.0000 | ORAL_TABLET | ORAL | Status: DC | PRN
  Administered 2019-09-09: 1 via ORAL
  Administered 2019-09-10: 2 via ORAL
  Filled 2019-09-09: qty 1
  Filled 2019-09-09: qty 2

## 2019-09-09 MED ORDER — CHLORHEXIDINE GLUCONATE 0.12 % MT SOLN
OROMUCOSAL | Status: AC
  Administered 2019-09-09: 15 mL via OROMUCOSAL
  Filled 2019-09-09: qty 15

## 2019-09-09 MED ORDER — BISACODYL 10 MG RE SUPP: 10.0000 mg | Freq: Every day | RECTAL | Status: DC | PRN

## 2019-09-09 MED ORDER — ONDANSETRON HCL 4 MG/2ML IJ SOLN: 4.0000 mg | Freq: Four times a day (QID) | INTRAMUSCULAR | Status: DC | PRN

## 2019-09-09 MED ORDER — ACETAMINOPHEN 500 MG PO TABS
500.0000 mg | ORAL_TABLET | Freq: Four times a day (QID) | ORAL | Status: AC
  Administered 2019-09-09 – 2019-09-10 (×3): 500 mg via ORAL
  Filled 2019-09-09 (×5): qty 1

## 2019-09-09 MED ORDER — CEFAZOLIN SODIUM-DEXTROSE 2-4 GM/100ML-% IV SOLN
2.0000 g | Freq: Four times a day (QID) | INTRAVENOUS | Status: AC
  Administered 2019-09-09 – 2019-09-10 (×2): 2 g via INTRAVENOUS
  Filled 2019-09-09 (×2): qty 100

## 2019-09-09 MED ORDER — SUGAMMADEX SODIUM 200 MG/2ML IV SOLN
INTRAVENOUS | Status: DC | PRN
  Administered 2019-09-09: 200 mg via INTRAVENOUS

## 2019-09-09 MED ORDER — PROPOFOL 10 MG/ML IV BOLUS
INTRAVENOUS | Status: AC
  Filled 2019-09-09: qty 20

## 2019-09-09 MED ORDER — OXYCODONE HCL 5 MG/5ML PO SOLN: 5.0000 mg | Freq: Once | ORAL | Status: DC | PRN

## 2019-09-09 SURGICAL SUPPLY — 56 items
ADH SKN CLS APL DERMABOND .7 (GAUZE/BANDAGES/DRESSINGS) ×1
BLADE SAW SGTL 18X1.27X75 (BLADE) ×2 IMPLANT
BLADE SAW SGTL 18X1.27X75MM (BLADE) ×1
COVER SURGICAL LIGHT HANDLE (MISCELLANEOUS) ×3 IMPLANT
COVER WAND RF STERILE (DRAPES) ×3 IMPLANT
DERMABOND ADVANCED (GAUZE/BANDAGES/DRESSINGS) ×2
DERMABOND ADVANCED .7 DNX12 (GAUZE/BANDAGES/DRESSINGS) ×1 IMPLANT
DRAPE IMP U-DRAPE 54X76 (DRAPES) ×3 IMPLANT
DRAPE INCISE IOBAN 85X60 (DRAPES) ×3 IMPLANT
DRAPE ORTHO SPLIT 77X108 STRL (DRAPES) ×6
DRAPE SURG ORHT 6 SPLT 77X108 (DRAPES) ×2 IMPLANT
DRAPE U-SHAPE 47X51 STRL (DRAPES) ×3 IMPLANT
DRESSING AQUACEL AG SP 3.5X10 (GAUZE/BANDAGES/DRESSINGS) IMPLANT
DRSG AQUACEL AG ADV 3.5X10 (GAUZE/BANDAGES/DRESSINGS) ×3 IMPLANT
DRSG AQUACEL AG SP 3.5X10 (GAUZE/BANDAGES/DRESSINGS) ×3
DURAPREP 26ML APPLICATOR (WOUND CARE) ×3 IMPLANT
ELECT BLADE 4.0 EZ CLEAN MEGAD (MISCELLANEOUS) ×3
ELECT REM PT RETURN 9FT ADLT (ELECTROSURGICAL) ×3
ELECTRODE BLDE 4.0 EZ CLN MEGD (MISCELLANEOUS) ×1 IMPLANT
ELECTRODE REM PT RTRN 9FT ADLT (ELECTROSURGICAL) ×1 IMPLANT
EVACUATOR 1/8 PVC DRAIN (DRAIN) IMPLANT
FACESHIELD WRAPAROUND (MASK) ×6 IMPLANT
FACESHIELD WRAPAROUND OR TEAM (MASK) ×2 IMPLANT
GLOVE BIOGEL PI IND STRL 7.5 (GLOVE) ×1 IMPLANT
GLOVE BIOGEL PI IND STRL 8.5 (GLOVE) ×2 IMPLANT
GLOVE BIOGEL PI INDICATOR 7.5 (GLOVE) ×2
GLOVE BIOGEL PI INDICATOR 8.5 (GLOVE) ×4
GLOVE ECLIPSE 8.0 STRL XLNG CF (GLOVE) ×3 IMPLANT
GLOVE ORTHO TXT STRL SZ7.5 (GLOVE) ×3 IMPLANT
GOWN STRL REUS W/ TWL LRG LVL3 (GOWN DISPOSABLE) ×3 IMPLANT
GOWN STRL REUS W/TWL 2XL LVL3 (GOWN DISPOSABLE) ×3 IMPLANT
GOWN STRL REUS W/TWL LRG LVL3 (GOWN DISPOSABLE) ×9
HANDPIECE INTERPULSE COAX TIP (DISPOSABLE)
HEAD FEM UNIPOLAR 54 OD STRL (Hips) ×2 IMPLANT
IMMOBILIZER KNEE 22 UNIV (SOFTGOODS) ×3 IMPLANT
KIT BASIN OR (CUSTOM PROCEDURE TRAY) ×3 IMPLANT
KIT TURNOVER KIT B (KITS) ×3 IMPLANT
MANIFOLD NEPTUNE II (INSTRUMENTS) ×3 IMPLANT
NS IRRIG 1000ML POUR BTL (IV SOLUTION) ×3 IMPLANT
PACK TOTAL JOINT (CUSTOM PROCEDURE TRAY) ×3 IMPLANT
PACK UNIVERSAL I (CUSTOM PROCEDURE TRAY) ×3 IMPLANT
PAD ARMBOARD 7.5X6 YLW CONV (MISCELLANEOUS) ×6 IMPLANT
PILLOW ABDUCTION MEDIUM (MISCELLANEOUS) ×2 IMPLANT
SET HNDPC FAN SPRY TIP SCT (DISPOSABLE) IMPLANT
SPACER FEM TAPERED +5 12/14 (Hips) ×2 IMPLANT
SPONGE LAP 4X18 RFD (DISPOSABLE) ×6 IMPLANT
STEM FEMORAL SZ9 HIGH ACTIS (Stem) ×2 IMPLANT
SUT MNCRL AB 4-0 PS2 18 (SUTURE) IMPLANT
SUT VIC AB 1 CT1 27 (SUTURE) ×6
SUT VIC AB 1 CT1 27XBRD ANBCTR (SUTURE) ×2 IMPLANT
SUT VIC AB 2-0 CT1 27 (SUTURE) ×6
SUT VIC AB 2-0 CT1 TAPERPNT 27 (SUTURE) IMPLANT
TOWEL GREEN STERILE (TOWEL DISPOSABLE) ×3 IMPLANT
TOWEL GREEN STERILE FF (TOWEL DISPOSABLE) ×3 IMPLANT
TRAY FOLEY W/BAG SLVR 14FR (SET/KITS/TRAYS/PACK) IMPLANT
WATER STERILE IRR 1000ML POUR (IV SOLUTION) ×12 IMPLANT

## 2019-09-09 NOTE — Anesthesia Procedure Notes (Signed)

## 2019-09-09 NOTE — Progress Notes (Signed)
Patient ID: Walter [name], male   DOB: July 08, 1950, 69 y.o.   MRN: [id number] Subjective:    Patient reports pain as moderate.  Objective:   VITALS:   Vitals:   09/09/19 0857 09/09/19 1627  BP: 139/75 119/81  Pulse: 93 96  Resp: 20 20  Temp: 98.3 F (36.8 C) 98.6 F (37 C)  SpO2: 96% 96%    Neurovascular intact  LABS Recent Labs    09/08/19 0304 09/09/19 0343  HGB 16.0 15.7  HCT 48.9 48.2  WBC 12.6* 14.2*  PLT 271 282    Recent Labs    09/08/19 0304 09/09/19 0343  NA 137 138  K 4.0 3.6  BUN 18 18  CREATININE 1.00 0.95  GLUCOSE 111* 108*    No results for input(s): LABPT, INR in the last 72 hours.   Assessment/Plan:  Left hip fracture  To OR today for left hip hemi NPO Consent ordered

## 2019-09-09 NOTE — Interval H&P Note (Signed)
History and Physical Interval Note:  09/09/2019 6:02 PM  Walter [name]  has presented today for surgery, with the diagnosis of LEFT HIP FRACTURE.  The various methods of treatment have been discussed with the patient and family. After consideration of risks, benefits and other options for treatment, the patient has consented to  Procedure(s): ARTHROPLASTY BIPOLAR HIP (HEMIARTHROPLASTY) (Left) as a surgical intervention.  The patient's history has been reviewed, patient examined, no change in status, stable for surgery.  I have reviewed the patient's chart and labs.  Questions were answered to the patient's satisfaction.     Mauri Pole

## 2019-09-09 NOTE — H&P (View-Only) (Signed)
Patient ID: Walter [name], male   DOB: 27-Sep-1950, 69 y.o.   MRN: [id number] Subjective:    Patient reports pain as moderate.  Objective:   VITALS:   Vitals:   09/09/19 0857 09/09/19 1627  BP: 139/75 119/81  Pulse: 93 96  Resp: 20 20  Temp: 98.3 F (36.8 C) 98.6 F (37 C)  SpO2: 96% 96%    Neurovascular intact  LABS Recent Labs    09/08/19 0304 09/09/19 0343  HGB 16.0 15.7  HCT 48.9 48.2  WBC 12.6* 14.2*  PLT 271 282    Recent Labs    09/08/19 0304 09/09/19 0343  NA 137 138  K 4.0 3.6  BUN 18 18  CREATININE 1.00 0.95  GLUCOSE 111* 108*    No results for input(s): LABPT, INR in the last 72 hours.   Assessment/Plan:  Left hip fracture  To OR today for left hip hemi NPO Consent ordered

## 2019-09-09 NOTE — Progress Notes (Signed)
Progress Note    [name]  CWC:[id number] DOB: July 29, 1950  DOA: 09/07/2019 PCP: Dettinger, Fransisca Kaufmann, MD    Brief Narrative:     Medical records reviewed and are as summarized below:  Walter [name] is an 69 y.o. male with past medical hx htn, hld, cva, cad, copd admitted 6/5 from [facility] with hip fx after mechanical fall. Family reports frequent falls since cva. He also has persistent dysarthria and intermittent dysphagia. Not always compliant with recommendations such as rehab etc.    Assessment/Plan:   Principal Problem:   Hip fracture ([place]) Active Problems:   COPD (chronic obstructive pulmonary disease) (HCC)   HTN (hypertension)   Tobacco abuse   Depression, recurrent (HCC)   History of CVA (cerebrovascular accident)   Hyperlipidemia, mixed   DNR (do not resuscitate)   #1.  Left hip fracture.  Status post mechanical fall.  Evaluated by orthopedics who opined surgery hopefully today. -Pain control with morphine, vicodin, robaxin and bucks traction -scd -npo  #2.  Hypertension.  Fair control.  Home meds include Norvasc. -Continue Norvasc  #3.  COPD with ongoing tobacco use.  Not on home oxygen.  Oxygen saturation levels greater than 90% room air. -Patient counseling on -nicotine patch  #4. HLD -continue lipitor  #5. Depression. Stable at baseline -continue wellbutrin  #6. Leukocytosis. WBC's trending up slightly. Likely reactive related to #1. Afebrile. Lung sounds clear -will obtain urinalysis -consider chest xray     Family Communication/Anticipated D/C date and plan/Code Status   DVT prophylaxis: Lovenox ordered but on hold today for surgery scd Code Status: dnr.  Family Communication: wife and sister at bedside Disposition Plan: Status is: Inpatient  Remains inpatient appropriate because:Inpatient level of care appropriate due to severity of illness   Dispo: The patient is from: Home              Anticipated d/c is to: SNF               Anticipated d/c date is: 2 days              Patient currently is not medically stable to d/c.         Medical Consultants:    ortho   Anti-Infectives:    None  Subjective:   Reports improved pain control. Requesting to "hurry up " with surgery  Objective:    Vitals:   09/08/19 2113 09/08/19 2113 09/09/19 0404 09/09/19 0857  BP: (!) 145/89 (!) 145/89 137/80 139/75  Pulse: 100 100 97 93  Resp: 18 18 18 20   Temp: 98.4 F (36.9 C) 98.4 F (36.9 C) 98.2 F (36.8 C) 98.3 F (36.8 C)  TempSrc: Oral Oral  Oral  SpO2:  96% 97% 96%  Weight:      Height:        Intake/Output Summary (Last 24 hours) at 09/09/2019 1050 Last data filed at 09/09/2019 0404 Gross per 24 hour  Intake 586 ml  Output 800 ml  Net -214 ml   Filed Weights   09/07/19 1615  Weight: 80 kg    Exam: Gen: thin slightly pale no acute distess CV: RRR no mgr no LE edema. Traction to left leg. Toes warm Resp: no increased work of breathing. BS distant but clear no wheeze Abdomen: non-distended soft +BS non-tender to palpation no guarding Musculoskelatal: bucks traction to left leg. Toes left foot warm no erythema/swelling of remaining joints Neuro: alert oriented x3 follows commands  Data Reviewed:  I have personally reviewed following labs and imaging studies:  Labs: Labs show the following:   Basic Metabolic Panel: Recent Labs  Lab 09/08/19 0304 09/09/19 0343  NA 137 138  K 4.0 3.6  CL 104 104  CO2 19* 22  GLUCOSE 111* 108*  BUN 18 18  CREATININE 1.00 0.95  CALCIUM 8.9 8.9   GFR Estimated Creatinine Clearance: 81.8 mL/min (by C-G formula based on SCr of 0.95 mg/dL). Liver Function Tests: No results for input(s): AST, ALT, ALKPHOS, BILITOT, PROT, ALBUMIN in the last 168 hours. No results for input(s): LIPASE, AMYLASE in the last 168 hours. No results for input(s): AMMONIA in the last 168 hours. Coagulation profile No results for input(s): INR, PROTIME in the last 168  hours.  CBC: Recent Labs  Lab 09/08/19 0304 09/09/19 0343  WBC 12.6* 14.2*  HGB 16.0 15.7  HCT 48.9 48.2  MCV 91.7 91.3  PLT 271 282   Cardiac Enzymes: No results for input(s): CKTOTAL, CKMB, CKMBINDEX, TROPONINI in the last 168 hours. BNP (last 3 results) No results for input(s): PROBNP in the last 8760 hours. CBG: No results for input(s): GLUCAP in the last 168 hours. D-Dimer: No results for input(s): DDIMER in the last 72 hours. Hgb A1c: No results for input(s): HGBA1C in the last 72 hours. Lipid Profile: No results for input(s): CHOL, HDL, LDLCALC, TRIG, CHOLHDL, LDLDIRECT in the last 72 hours. Thyroid function studies: No results for input(s): TSH, T4TOTAL, T3FREE, THYROIDAB in the last 72 hours.  Invalid input(s): FREET3 Anemia work up: No results for input(s): VITAMINB12, FOLATE, FERRITIN, TIBC, IRON, RETICCTPCT in the last 72 hours. Sepsis Labs: Recent Labs  Lab 09/08/19 0304 09/09/19 0343  WBC 12.6* 14.2*    Microbiology Recent Results (from the past 240 hour(s))  Surgical PCR screen     Status: Abnormal   Collection Time: 09/07/19  4:22 PM   Specimen: Nasal Mucosa; Nasal Swab  Result Value Ref Range Status   MRSA, PCR NEGATIVE NEGATIVE Final   Staphylococcus aureus POSITIVE (A) NEGATIVE Final    Comment: CRITICAL RESULT CALLED TO, READ BACK BY AND VERIFIED WITH: RN, PAM R. @0521  [id number] THANEY Performed at [facility], [place] [address] [place], [place] [address]     Procedures and diagnostic studies:  DG HIP UNILAT WITH PELVIS 2-3 VIEWS LEFT  Result Date: 09/08/2019 CLINICAL DATA:  LEFT hip fracture, fell on 09/06/2019 EXAM: DG HIP (WITH OR WITHOUT PELVIS) 2-3V LEFT COMPARISON:  09/07/2019 FINDINGS: Osseous demineralization. Hip and SI joint spaces preserved. Displaced fracture LEFT femoral neck with slight resorption at fracture line since prior study. No dislocation. Pelvis intact. Nondiagnostic cross-table shoot through views. IMPRESSION:  Displaced LEFT femoral neck fracture. Electronically Signed   By: Lavonia Dana M.D.   On: 09/08/2019 13:04    Medications:   . amLODipine  5 mg Oral Daily  . atorvastatin  40 mg Oral Daily  . buPROPion  300 mg Oral Daily  . Chlorhexidine Gluconate Cloth  6 each Topical Daily  . docusate sodium  100 mg Oral BID  . mupirocin ointment  1 application Nasal BID  . nicotine  14 mg Transdermal Daily  . povidone-iodine  2 application Topical Once   Continuous Infusions: .  ceFAZolin (ANCEF) IV    . methocarbamol (ROBAXIN) IV    . tranexamic acid       LOS: 2 days   Radene Gunning NP [facility]   How to contact the [facility] Attending or Consulting  provider [place] or covering provider during after hours [place], for this patient?  1. Check the care team in [facility] and look for a) attending/consulting [facility] provider listed and b) the [facility] team listed 2. Log into [email or web address] and use [place]'s universal password to access. If you do not have the password, please contact the hospital operator. 3. Locate the [facility] provider you are looking for under [facility] and page to a number that you can be directly reached. 4. If you still have difficulty reaching the provider, please page the [facility] (Director on Call) for the Hospitalists listed on amion for assistance.  09/09/2019, 10:50 AM

## 2019-09-09 NOTE — Plan of Care (Signed)

## 2019-09-09 NOTE — Anesthesia Preprocedure Evaluation (Addendum)
Anesthesia Evaluation  Patient identified by MRN, date of birth, ID band Patient awake    Reviewed: Allergy & Precautions, NPO status , Patient's Chart, lab work & pertinent test results  Airway Mallampati: II  TM Distance: >3 FB Neck ROM: Full    Dental no notable dental hx.    Pulmonary COPD, Current Smoker and Patient abstained from smoking.,    Pulmonary exam normal breath sounds clear to auscultation       Cardiovascular hypertension, Pt. on medications + CAD and + Past MI  Normal cardiovascular exam Rhythm:Regular Rate:Normal     Neuro/Psych Depression CVA negative psych ROS   GI/Hepatic negative GI ROS, Neg liver ROS,   Endo/Other  negative endocrine ROS  Renal/GU negative Renal ROS  negative genitourinary   Musculoskeletal negative musculoskeletal ROS (+)   Abdominal   Peds negative pediatric ROS (+)  Hematology negative hematology ROS (+)   Anesthesia Other Findings   Reproductive/Obstetrics negative OB ROS                           Anesthesia Physical Anesthesia Plan  ASA: III  Anesthesia Plan: General   Post-op Pain Management:    Induction: Intravenous  PONV Risk Score and Plan: 1 and Ondansetron  Airway Management Planned: Oral ETT  Additional Equipment:   Intra-op Plan:   Post-operative Plan: Extubation in OR  Informed Consent: I have reviewed the patients History and Physical, chart, labs and discussed the procedure including the risks, benefits and alternatives for the proposed anesthesia with the patient or authorized representative who has indicated his/her understanding and acceptance.     Dental advisory given  Plan Discussed with: CRNA and Anesthesiologist  Anesthesia Plan Comments:        Anesthesia Quick Evaluation

## 2019-09-09 NOTE — Op Note (Signed)
NAME:  Walter [name]                  MEDICAL RECORD NO.: [id number]   LOCATION:  9379                         FACILITY:  [facility]   DATE OF BIRTH:  09/14/2050  PHYSICIAN:  Pietro Cassis. Alvan Dame, M.D.     DATE OF PROCEDURE:  09/09/19                               OPERATIVE REPORT     PREOPERATIVE DIAGNOSIS:  left displaced femoral neck fracture.   POSTOPERATIVE DIAGNOSIS:  left displaced femoral neck fracture.   PROCEDURE:  left hip hemiarthroplasty utilizing DePuy component, size 9 Hi  Actis stem with a 54 mm unipolar ball with a +5 adapter.   SURGEON:  Pietro Cassis. Alvan Dame, MD   ASSISTANT:  Griffith Citron, PA-C.   ANESTHESIA:  General.   SPECIMENS:  None.   DRAINS:  None   BLOOD LOSS:  About 100 cc.   COMPLICATIONS:  None.   INDICATION OF PROCEDURE:  [name] is a 69 year old male who lives independently.  He unfortunately had a fall at her house.  He was admitted to the hospital after radiographs revealed a femoral neck fracture.  He was seen and evaluated and was scheduled for surgery for fixation.  The necessity of surgical repair was discussed with she and her family.  Consent was obtained after reviewing risks of infection, DVT, component failure, and need for revision surgery.   PROCEDURE IN DETAIL:  The patient was brought to the operative theater. Once adequate anesthesia, preoperative antibiotics, 2 g of Ancef administered, the patient was positioned into the right lateral decubitus position with the left side up.  The left lower extremity was then prepped and draped in sterile fashion.  A time-out was performed identifying the patient, planned procedure, and extremity.   A lateral incision was made off the proximal trochanter. Sharp dissection was carried down to the iliotibial band and gluteal fascia. The gluteal fascia was then incised for posterior approach.  The short external rotators were taken down separate from the posterior capsule. An L capsulotomy was made  preserving the posterior leaflet for later anatomic repair. Fracture site was identified and after removing comminuted segments of the posterior femoral neck, the femoral head was removed without difficulty and measured on the back table  using the sizing rings and determined to be 54 mm in diameter.   The proximal femur was then exposed.  Retractors placed.  I then drilled, opened the proximal femur.  Then I hand reamed once and  Irrigated the canal to try to prevent fat emboli.  I began broaching the femur with a starter broach up to a size 9 broach with good medial and lateral metaphyseal fit without evidence of any torsion or movement.  A trial reduction was carried out with a high offsey neck and a +0 then +5 adapter with a 54 mm ball.  The hip reduced nicely.  The leg lengths appeared to be equal compared to the down leg.   The hip went through a range of motion without evidence of any subluxation or impingement.   Given these findings, the trial components removed.  The final 9 Hi  Actis stem was opened.  After irrigating the canal, the final stem was impacted  and sat at the level where the broach was. Based on this and the trial reduction, a +5 adapter was opened and impacted in the 54 mm unipolar ball onto a clean and dry trunnion.  The hip had been irrigated throughout the case and again at this point.  I re- Approximated the posterior capsule to the superior leaflet using a  #1 Vicryl,  and placed a medium Hemovac drain deep.  The remainder of the wound was closed with #1 Vicryl in the iliotibial band and gluteal fascia, a  2-0 Vicryl in the sub-Q tissue and a running 4-0 Monocryl in the skin.  The hip was cleaned, dried, and dressed sterilely using Dermabond and Aquacel dressing.  Drain site was dressed separately.  She was then brought to recovery room, extubated in stable condition, tolerating the procedure well.  Griffith Citron, PA-C was present and utilized as  Environmental consultant for the entire case from  Preoperative positioning to management of the contralateral extremity and retractors to  General facilitation of the procedure.  He was also involved with primary wound closure.         Pietro Cassis Alvan Dame, M.D.

## 2019-09-09 NOTE — Anesthesia Postprocedure Evaluation (Signed)
Anesthesia Post Note  Patient: Walter [name]  Procedure(s) Performed: ARTHROPLASTY BIPOLAR HIP (HEMIARTHROPLASTY) (Left Hip)     Patient location during evaluation: PACU Anesthesia Type: General Level of consciousness: awake Pain management: pain level controlled Vital Signs Assessment: post-procedure vital signs reviewed and stable Respiratory status: spontaneous breathing Cardiovascular status: stable Postop Assessment: no apparent nausea or vomiting Anesthetic complications: no    Last Vitals:  Vitals:   09/09/19 1945 09/09/19 2000  BP: (!) 172/101 (!) 169/93  Pulse: (!) 104 (!) 103  Resp: 20 19  Temp: 36.6 C   SpO2: 99% 94%    Last Pain:  Vitals:   09/09/19 1945  TempSrc:   PainSc: Asleep                 Nicolena Schurman

## 2019-09-09 NOTE — Transfer of Care (Signed)
Immediate Anesthesia Transfer of Care Note  Patient: Walter [name]  Procedure(s) Performed: ARTHROPLASTY BIPOLAR HIP (HEMIARTHROPLASTY) (Left Hip)  Patient Location: PACU  Anesthesia Type:General  Level of Consciousness: sedated  Airway & Oxygen Therapy: Patient connected to face mask oxygen  Post-op Assessment: Report given to RN and Post -op Vital signs reviewed and stable  Post vital signs: Reviewed and stable  Last Vitals:  Vitals Value Taken Time  BP    Temp    Pulse 104 09/09/19 1945  Resp 21 09/09/19 1944  SpO2 100 % 09/09/19 1945  Vitals shown include unvalidated device data.  Last Pain:  Vitals:   09/09/19 1627  TempSrc: Oral  PainSc:       Patients Stated Pain Goal: 3 (04/12/30 3557)  Complications: No apparent anesthesia complications

## 2019-09-09 NOTE — Progress Notes (Signed)
Spouse and patient stated that MD has not discussed surgical procedure with patient or wife at this time, consent is filled out but not signed.

## 2019-09-09 NOTE — Plan of Care (Signed)
  Problem: Pain Managment: Goal: General experience of comfort will improve Outcome: Progressing   Problem: Safety: Goal: Ability to remain free from injury will improve Outcome: Progressing   Problem: Skin Integrity: Goal: Risk for impaired skin integrity will decrease Outcome: Progressing   Problem: Activity: Goal: Ability to ambulate and perform ADLs will improve Outcome: Progressing   Problem: Pain Management: Goal: Pain level will decrease Outcome: Progressing   

## 2019-09-09 NOTE — Discharge Instructions (Signed)

## 2019-09-09 NOTE — Progress Notes (Signed)
Initial Nutrition Assessment  DOCUMENTATION CODES:   Not applicable  INTERVENTION:   -Consider swallow evaluation for swallow safety; recommendation discussed with Dr. Eliseo Squires -MVI with minerals daily -Ensure Enlive po BID, each supplement provides 350 kcal and 20 grams of protein -Magic cup BID with meals, each supplement provides 290 kcal and 9 grams of protein  NUTRITION DIAGNOSIS:   Increased nutrient needs related to post-op healing as evidenced by estimated needs.  GOAL:   Patient will meet greater than or equal to 90% of their needs  MONITOR:   PO intake, Supplement acceptance, Diet advancement, Labs, Weight trends, Skin, I & O's  REASON FOR ASSESSMENT:   Consult Hip fracture protocol  ASSESSMENT:   Walter [name] is a 69 y.o. male with medical history significant of HTN; HLD; CVA (2013); CAD; and COPD presenting as a transfer from [facility] with a hip fracture.  He  [address] down and landed on his hip  Pt admitted with lt hip fracture s/p mechanical fall.   Reviewed I/O's: -514 ml x 24 hours and -2.3 L since admission  UOP: 1.1 L x 24 hours  Per orthopedics notes, plan for arthroplasty today.   Pt very sleepy at time of visit. He deferred history to his wife and sister at bedside (wife provided mot of the history). Per wife, pt has had several strokes with the most recent occurring approximately one year ago. Pt did not make much progress with therapy after this most recently stroke and pt "shuffles" at baseline and has had increased falls over the past several months.   Pt wife shares that pt is a choosy eater and intake has decreased over the past year. Pt typically consumes 3 meals per day (Breakfast: eggs and bacon; Lunch: banana sandwich; and Dinner: green beans, pinto beans, mashed potatoes). Pt likes to consume sweets and [facility]. Of note, pt wife reports that pt has gotten strangled more frequently in the past few months on both foods and liquids. He has had a  swallow eval after last stroke, but was recommended a regular diet with thin liquids. Discussed above concerns with Dr. Eliseo Squires, who agrees that pt may benefit from a swallow evaluation post-operatively.   Pt with limited intake over the past few days. He was previously on a regular diet. Noted meal completion 25%. Discussed importance of good meal and supplement intake to promote healing. He is amenable to try Ensure supplements. Wife suspects some moderate weight loss since admission due to not eating much. Per her report, UBW is around 175#. Pt has experienced a 7.8% wt loss over the past year. While this is not significant for time frame, it is concerning given functional decline.  Labs reviewed.   NUTRITION - FOCUSED PHYSICAL EXAM:    Most Recent Value  Orbital Region  No depletion  Upper Arm Region  No depletion  Thoracic and Lumbar Region  No depletion  Buccal Region  No depletion  Temple Region  Mild depletion  Clavicle Bone Region  No depletion  Clavicle and Acromion Bone Region  No depletion  Scapular Bone Region  No depletion  Dorsal Hand  Mild depletion  Patellar Region  No depletion  Anterior Thigh Region  No depletion  Posterior Calf Region  No depletion  Edema (RD Assessment)  None  Hair  Reviewed  Eyes  Reviewed  Mouth  Reviewed  Skin  Reviewed  Nails  Reviewed       Diet Order:   Diet Order  Diet NPO time specified Except for: Sips with Meds  Diet effective midnight              EDUCATION NEEDS:   Education needs have been addressed  Skin:  Skin Assessment: Reviewed RN Assessment  Last BM:  Unknown  Height:   Ht Readings from Last 1 Encounters:  09/07/19 6\' 1"  (1.854 m)    Weight:   Wt Readings from Last 1 Encounters:  09/07/19 80 kg    Ideal Body Weight:  83.6 kg  BMI:  Body mass index is 23.27 kg/m.  Estimated Nutritional Needs:   Kcal:  2200-2400  Protein:  120-135 grams  Fluid:  > 2.2 L    Loistine Chance, RD, LDN,  [place] Registered Dietitian II Certified Diabetes Care and Education Specialist Please refer to [facility] for RD and/or RD on-call/weekend/after hours pager

## 2019-09-10 ENCOUNTER — Inpatient Hospital Stay (HOSPITAL_COMMUNITY): Payer: Medicare HMO

## 2019-09-10 ENCOUNTER — Encounter: Payer: Self-pay | Admitting: *Deleted

## 2019-09-10 LAB — CBC
HCT: 47.7 % (ref 39.0–52.0)
Hemoglobin: 15.6 g/dL (ref 13.0–17.0)
MCH: 30.1 pg (ref 26.0–34.0)
MCHC: 32.7 g/dL (ref 30.0–36.0)
MCV: 92.1 fL (ref 80.0–100.0)
Platelets: 253 10*3/uL (ref 150–400)
RBC: 5.18 MIL/uL (ref 4.22–5.81)
RDW: 12.8 % (ref 11.5–15.5)
WBC: 13 10*3/uL — ABNORMAL HIGH (ref 4.0–10.5)
nRBC: 0 % (ref 0.0–0.2)

## 2019-09-10 LAB — URINALYSIS, ROUTINE W REFLEX MICROSCOPIC
Bilirubin Urine: NEGATIVE
Glucose, UA: NEGATIVE mg/dL
Ketones, ur: 20 mg/dL — AB
Leukocytes,Ua: NEGATIVE
Nitrite: NEGATIVE
Protein, ur: 100 mg/dL — AB
RBC / HPF: >50 RBC/hpf — ABNORMAL HIGH (ref 0–5)
Specific Gravity, Urine: 1.03 (ref 1.005–1.030)
pH: 5 (ref 5.0–8.0)

## 2019-09-10 LAB — BASIC METABOLIC PANEL
Anion gap: 18 — ABNORMAL HIGH (ref 5–15)
BUN: 19 mg/dL (ref 8–23)
CO2: 22 mmol/L (ref 22–32)
Calcium: 8.2 mg/dL — ABNORMAL LOW (ref 8.9–10.3)
Chloride: 99 mmol/L (ref 98–111)
Creatinine, Ser: 1.09 mg/dL (ref 0.61–1.24)
GFR calc Af Amer: >60 mL/min (ref >60)
GFR calc non Af Amer: >60 mL/min (ref >60)
Glucose, Bld: 124 mg/dL — ABNORMAL HIGH (ref 70–99)
Potassium: 3.3 mmol/L — ABNORMAL LOW (ref 3.5–5.1)
Sodium: 139 mmol/L (ref 135–145)

## 2019-09-10 LAB — GLUCOSE, CAPILLARY: Glucose-Capillary: 142 mg/dL — ABNORMAL HIGH (ref 70–99)

## 2019-09-10 MED ORDER — POTASSIUM CHLORIDE CRYS ER 20 MEQ PO TBCR
40.0000 meq | EXTENDED_RELEASE_TABLET | Freq: Once | ORAL | Status: AC
  Administered 2019-09-10: 40 meq via ORAL
  Filled 2019-09-10: qty 2

## 2019-09-10 MED ORDER — LORAZEPAM 2 MG/ML IJ SOLN
0.5000 mg | Freq: Once | INTRAMUSCULAR | Status: AC
  Administered 2019-09-10: 0.5 mg via INTRAVENOUS
  Filled 2019-09-10: qty 1

## 2019-09-10 NOTE — NC FL2 (Signed)
[place] LEVEL OF CARE SCREENING TOOL     IDENTIFICATION  Patient Name: Walter [name] Birthdate: 30-Sep-1950 Sex: male Admission Date (Current Location): 09/07/2019  [facility] and [place] Number:  Herbalist and Address:  [facility], [place] [address], [place], [place] [address]      Provider Number: [id number]  Attending Physician Name and Address:  Geradine Girt, DO  Relative Name and Phone Number:       Current Level of Care: Hospital Recommended Level of Care: [place] Prior Approval Number:    Date Approved/Denied:   PASRR Number: [id number]  Discharge Plan: SNF    Current Diagnoses: Patient Active Problem List   Diagnosis Date Noted  . S/P left THA, AA 09/09/2019  . DNR (do not resuscitate) 09/08/2019  . Hip fracture ([place]) 09/07/2019  . Depression, recurrent ([place]) 04/06/2018  . Hyperlipidemia, mixed 04/06/2018  . HTN (hypertension) 01/12/2015  . BPH (benign prostatic hyperplasia) 01/12/2015  . Nodule of kidney 02/14/2014  . Weakness 02/11/2014  . Recurrent colitis due to Clostridium difficile 02/11/2014  . Occlusion and stenosis of carotid artery without mention of cerebral infarction 09/07/2012  . History of CVA (cerebrovascular accident) 02/18/2012  . Tobacco abuse 02/18/2012  . Non-compliance 02/18/2012  . COPD (chronic obstructive pulmonary disease) ([place]) 02/18/2012    Orientation RESPIRATION BLADDER Height & Weight     Self, Time, Situation, Place  Normal Continent Weight: 80 kg Height:  6\' 1"  (185.4 cm)  BEHAVIORAL SYMPTOMS/MOOD NEUROLOGICAL BOWEL NUTRITION STATUS      Continent Diet(refer to d/c summary)  AMBULATORY STATUS COMMUNICATION OF NEEDS Skin   Extensive Assist Verbally Surgical wounds(ARTHROPLASTY BIPOLAR HIP (HEMIARTHROPLASTY) (Left Hip) , 09/09/2019)                       Personal Care Assistance Level of Assistance  Bathing, Feeding, Dressing Bathing Assistance:  Maximum assistance Feeding assistance: Independent Dressing Assistance: Maximum assistance     Functional Limitations Info  Sight, Hearing, Speech Sight Info: Adequate Hearing Info: Adequate Speech Info: Adequate(some dysarthia)    SPECIAL CARE FACTORS FREQUENCY  PT (By licensed PT), OT (By licensed OT)     PT Frequency: 5x/ week, evaluate and treat OT Frequency: 5x/ week, evaluate and treat            Contractures Contractures Info: Not present    Additional Factors Info  Code Status, Allergies Code Status Info: FullCode Allergies Info: No Known Allergies           Current Medications (09/10/2019):  This is the current hospital active medication list Current Facility-Administered Medications  Medication Dose Route Frequency Provider Last Rate Last Admin  . 0.9 %  sodium chloride infusion   Intravenous Continuous Maurice March, PA-C 100 mL/hr at 09/10/19 1023 New Bag at 09/10/19 1023  . acetaminophen (TYLENOL) tablet 325-650 mg  325-650 mg Oral Q6H PRN Maurice March, PA-C      . acetaminophen (TYLENOL) tablet 500 mg  500 mg Oral Q6H Griffith Citron R, PA-C   500 mg at 09/10/19 1248  . alum & mag hydroxide-simeth (MAALOX/MYLANTA) 200-200-20 MG/5ML suspension 15 mL  15 mL Oral Q4H PRN Maurice March, PA-C      . amLODipine (NORVASC) tablet 5 mg  5 mg Oral Daily Maurice March, PA-C   5 mg at 09/10/19 0854  . aspirin chewable tablet 81 mg  81 mg Oral BID Griffith Citron  R, PA-C   81 mg at 09/10/19 0854  . atorvastatin (LIPITOR) tablet 40 mg  40 mg Oral Daily Maurice March, PA-C   40 mg at 09/10/19 0855  . bisacodyl (DULCOLAX) EC tablet 5 mg  5 mg Oral Daily PRN Maurice March, PA-C      . bisacodyl (DULCOLAX) suppository 10 mg  10 mg Rectal Daily PRN Maurice March, PA-C      . buPROPion (WELLBUTRIN XL) 24 hr tablet 300 mg  300 mg Oral Daily Maurice March, PA-C   300 mg at 09/10/19 0855  . Chlorhexidine Gluconate Cloth 2 % PADS 6 each  6 each  Topical Daily Maurice March, PA-C   6 each at 09/09/19 6222  . clopidogrel (PLAVIX) tablet 75 mg  75 mg Oral Daily Maurice March, PA-C   75 mg at 09/10/19 0855  . diphenhydrAMINE (BENADRYL) 12.5 MG/5ML elixir 12.5-25 mg  12.5-25 mg Oral Q4H PRN Maurice March, PA-C      . docusate sodium (COLACE) capsule 100 mg  100 mg Oral BID Maurice March, PA-C   100 mg at 09/10/19 0854  . feeding supplement (ENSURE ENLIVE) (ENSURE ENLIVE) liquid 237 mL  237 mL Oral BID BM Maurice March, PA-C   237 mL at 09/10/19 1247  . ferrous sulfate tablet 325 mg  325 mg Oral TID PC Maurice March, PA-C   325 mg at 09/10/19 1248  . HYDROcodone-acetaminophen (NORCO) 7.5-325 MG per tablet 1-2 tablet  1-2 tablet Oral Q4H PRN Maurice March, PA-C   1 tablet at 09/09/19 2111  . HYDROcodone-acetaminophen (NORCO/VICODIN) 5-325 MG per tablet 1-2 tablet  1-2 tablet Oral Q4H PRN Maurice March, PA-C      . lactated ringers infusion   Intravenous Continuous Annye Asa, MD 10 mL/hr at 09/09/19 1742 New Bag at 09/09/19 1934  . magnesium citrate solution 1 Bottle  1 Bottle Oral Once PRN Maurice March, PA-C      . menthol-cetylpyridinium (CEPACOL) lozenge 3 mg  1 lozenge Oral PRN Maurice March, PA-C       Or  . phenol (CHLORASEPTIC) mouth spray 1 spray  1 spray Mouth/Throat PRN Maurice March, PA-C      . methocarbamol (ROBAXIN) tablet 500 mg  500 mg Oral Q6H PRN Maurice March, PA-C   500 mg at 09/09/19 2110   Or  . methocarbamol (ROBAXIN) 500 mg in dextrose 5 % 50 mL IVPB  500 mg Intravenous Q6H PRN Maurice March, PA-C      . metoCLOPramide (REGLAN) tablet 5-10 mg  5-10 mg Oral Q8H PRN Maurice March, PA-C       Or  . metoCLOPramide (REGLAN) injection 5-10 mg  5-10 mg Intravenous Q8H PRN Maurice March, PA-C      . morphine 2 MG/ML injection 0.5-1 mg  0.5-1 mg Intravenous Q2H PRN Maurice March, PA-C   1 mg at 09/09/19 2352  . multivitamin with minerals tablet 1 tablet  1  tablet Oral Daily Maurice March, PA-C   1 tablet at 09/10/19 0854  . mupirocin ointment (BACTROBAN) 2 % 1 application  1 application Nasal BID Maurice March, [place]   1 application at 97/98/92 1194  . nicotine (NICODERM CQ - dosed in mg/24 hours) patch 14 mg  14 mg Transdermal Daily Maurice March, PA-C   14 mg at 09/09/19 1740  . ondansetron (ZOFRAN) tablet 4 mg  4  mg Oral Q6H PRN Maurice March, PA-C       Or  . ondansetron [facility]) injection 4 mg  4 mg Intravenous Q6H PRN Maurice March, PA-C      . polyethylene glycol (MIRALAX / GLYCOLAX) packet 17 g  17 g Oral Daily PRN Griffith Citron R, PA-C      . polyethylene glycol (MIRALAX / GLYCOLAX) packet 17 g  17 g Oral BID Maurice March, PA-C   17 g at 09/10/19 4709     Discharge Medications: Please see discharge summary for a list of discharge medications.  Relevant Imaging Results:  Relevant Lab Results:   Additional Information SS# [id number]  Sharin Mons, RN

## 2019-09-10 NOTE — Progress Notes (Signed)
NT bladder scanned pt for 84cc.

## 2019-09-10 NOTE — TOC Initial Note (Addendum)
Transition of Care [facility]) - Initial/Assessment Note    Patient Details  Name: Walter [name] MRN: [id number] Date of Birth: 10-09-1950  Transition of [facility]) CM/SW Contact:    Sharin Mons, RN Phone Number: [phone] 09/10/2019, 2:35 PM  Clinical Narrative:   Presents s/p fall, R hip fx Hx of HTN; HLD; CVA, CAD; and COPD. From home with wife, Walter [name].        -s/p L hip hemiarthroplasty         NCM received consult for possible SNF placement at time of discharge.NCM spoke with patient and wife regarding PT recommendation of SNF placement at time of discharge. Wife  reported that she  is currently unable to care for patient at their home given patient's current physical needs and fall risk. Patient expressed understanding of PT recommendation and is agreeable to SNF placement at time of discharge. Patient/wife reports preference for [facility]. NCM discussed insurance authorization process and provided Medicare SNF ratings list. No further questions reported at this time. NCM to continue to follow and assist with discharge planning needs. Last noted COVID resulted on 09/09/2019. Pt has not received COVID vaccine.   Expected Discharge Plan: Skilled Nursing Facility Barriers to Discharge: Continued Medical Work up   Patient Goals and CMS Choice   CMS Medicare.gov Compare Post Acute Care list provided to:: Patient Represenative (must comment)(wife)    Expected Discharge Plan and Services Expected Discharge Plan: [place]   Discharge Planning Services: CM Consult                                          Prior Living Arrangements/Services   Lives with:: Spouse                   Activities of Daily Living Home Assistive Devices/Equipment: Environmental consultant (specify type)(Rolling) ADL Screening (condition at time of admission) Patient's cognitive ability adequate to safely complete daily activities?: Yes Is the patient deaf or have difficulty  hearing?: No Does the patient have difficulty seeing, even when wearing glasses/contacts?: No Does the patient have difficulty concentrating, remembering, or making decisions?: No Patient able to express need for assistance with ADLs?: Yes Does the patient have difficulty dressing or bathing?: Yes Independently performs ADLs?: No Communication: Needs assistance Is this a change from baseline?: Change from baseline, expected to last >3 days Dressing (OT): Needs assistance Is this a change from baseline?: Change from baseline, expected to last >3 days Grooming: Needs assistance Is this a change from baseline?: Change from baseline, expected to last >3 days Feeding: Independent Bathing: Needs assistance Is this a change from baseline?: Change from baseline, expected to last >3 days Toileting: Needs assistance Is this a change from baseline?: Change from baseline, expected to last >3days In/Out Bed: Dependent Is this a change from baseline?: Change from baseline, expected to last <3 days Walks in Home: Needs assistance Is this a change from baseline?: Change from baseline, expected to last >3 days Does the patient have difficulty walking or climbing stairs?: Yes Weakness of Legs: Both Weakness of Arms/Hands: None  Permission Sought/Granted                  Emotional Assessment              Admission diagnosis:  Hip fracture ([place]) [S72.009A] Patient Active Problem List   Diagnosis Date Noted  .  S/P left THA, AA 09/09/2019  . DNR (do not resuscitate) 09/08/2019  . Hip fracture ([place]) 09/07/2019  . Depression, recurrent ([place]) 04/06/2018  . Hyperlipidemia, mixed 04/06/2018  . HTN (hypertension) 01/12/2015  . BPH (benign prostatic hyperplasia) 01/12/2015  . Nodule of kidney 02/14/2014  . Weakness 02/11/2014  . Recurrent colitis due to Clostridium difficile 02/11/2014  . Occlusion and stenosis of carotid artery without mention of cerebral infarction 09/07/2012  . History  of CVA (cerebrovascular accident) 02/18/2012  . Tobacco abuse 02/18/2012  . Non-compliance 02/18/2012  . COPD (chronic obstructive pulmonary disease) ([place]) 02/18/2012   PCP:  Dettinger, Fransisca Kaufmann, MD Pharmacy:   [id number] - [place], [place] [address] Phone: [phone] Fax: [phone]  [facility] [place], [place] [address] Phone: [phone] Fax: [phone]     Social Determinants of Health (SDOH) Interventions    Readmission Risk Interventions No flowsheet data found.

## 2019-09-10 NOTE — Plan of Care (Signed)
  Problem: Pain Managment: Goal: General experience of comfort will improve Outcome: Progressing   Problem: Safety: Goal: Ability to remain free from injury will improve Outcome: Progressing   Problem: Skin Integrity: Goal: Risk for impaired skin integrity will decrease Outcome: Progressing   Problem: Activity: Goal: Ability to ambulate and perform ADLs will improve Outcome: Progressing   Problem: Pain Management: Goal: Pain level will decrease Outcome: Progressing   

## 2019-09-10 NOTE — Progress Notes (Signed)
Progress Note    [name]  WFU:[id number] DOB: [birth year]  DOA: 09/07/2019 PCP: Dettinger, Fransisca Kaufmann, MD    Brief Narrative:     Medical records reviewed and are as summarized below:  Walter [name] is an 69 y.o. male with medical history significant of HTN; HLD; CVA (2013); CAD; and COPD presenting as a transfer from [facility] with a hip fracture.  He  [place] down and landed on his hip.  His sister reports that he went to the store and fell trying to get out of his truck.  He laid there for a while and there weren't sure what was going on.  They piled him in a truck and drove him home and then he had to get back in his truck and go to the hospital in [place].  He has been falling a lot since his prior stroke.  Not light headed or dizzy.  He also has persistent dysarthria and intermittent dysphagia since his stroke.  He is "stubborn" and refused to go to rehab following the CVA and appears to have had progressive deficits over time.  Patient is status post operative repair on the evening of 6/7.  Assessment/Plan:   Principal Problem:   Hip fracture (HCC) Active Problems:   History of CVA (cerebrovascular accident)   Tobacco abuse   COPD (chronic obstructive pulmonary disease) (HCC)   HTN (hypertension)   Depression, recurrent (HCC)   Hyperlipidemia, mixed   DNR (do not resuscitate)   S/P left THA, AA   Hip fracture -Mechanical fall resulting in hip fracture -Patient is status post operative repair on 6/7 -SCDs overnight, start Lovenox post-operatively (or as per ortho) -Resume Plavix -Pain control with Robxain, Vicodin, and Morphine prn -SW consult for rehab placement -PT consultation  H/o CVA -Patient with remote h/o CVA but persistent dysarthria, dysphagia, and weakness resulting in frequent falls -Will need PT/OT ST consults post-operatively to determine best placement and diet -Resume Plavix  HTN -Continue Norvasc  HLD -Continue  Lipitor  Hypokalemia -replete  Depression -Continue Wellbutrin  COPD with ongoing tobacco dependence -No home O2 -He appears to not be taking any medication for this issue at this time -Tobacco Dependence: encourage cessation.   -Patch ordered Encourage incentives history    Family Communication/Anticipated D/C date and plan/Code Status   DVT prophylaxis: Lovenox ordered. Code Status: dnr *my Disposition Plan: Status is: Inpatient  Remains inpatient appropriate because:Ongoing diagnostic testing needed not appropriate for outpatient work up   Dispo: The patient is from: Home              Anticipated d/c is to: SNF              Anticipated d/c date is: > 3 days              Patient currently is not medically stable to d/c.         Medical Consultants:   Orthopedics   Subjective:   Nursing reports patient was up late overnight so is asleep this morning  Objective:    Vitals:   09/09/19 2041 09/10/19 0100 09/10/19 0300 09/10/19 0754  BP: 139/76 132/70 133/77 (!) 147/79  Pulse: 96 90 88 (!) 102  Resp: 15 15 16 18   Temp: 98.4 F (36.9 C) 98.2 F (36.8 C) 98.3 F (36.8 C) 99 F (37.2 C)  TempSrc: Oral Oral Oral Oral  SpO2: 95% 94% 91% 95%  Weight:      Height:  Intake/Output Summary (Last 24 hours) at 09/10/2019 1007 Last data filed at 09/10/2019 0300 Gross per 24 hour  Intake 1275 ml  Output 800 ml  Net 475 ml   Filed Weights   09/07/19 1615  Weight: 80 kg    Exam:   General: Appearance:   Chronically ill appearing male in no acute distress     Lungs:     Clear to auscultation bilaterally, respirations unlabored  Heart:    Tachycardic. Normal rhythm. No murmurs, rubs, or gallops.   MS:   All extremities are intact.        Data Reviewed:   I have personally reviewed following labs and imaging studies:  Labs: Labs show the following:   Basic Metabolic Panel: Recent Labs  Lab 09/08/19 0304 09/08/19 0304 09/09/19 0343  09/10/19 0428  NA 137  --  138 139  K 4.0   < > 3.6 3.3*  CL 104  --  104 99  CO2 19*  --  22 22  GLUCOSE 111*  --  108* 124*  BUN 18  --  18 19  CREATININE 1.00  --  0.95 1.09  CALCIUM 8.9  --  8.9 8.2*   < > = values in this interval not displayed.   GFR Estimated Creatinine Clearance: 71.3 mL/min (by C-G formula based on SCr of 1.09 mg/dL). Liver Function Tests: No results for input(s): AST, ALT, ALKPHOS, BILITOT, PROT, ALBUMIN in the last 168 hours. No results for input(s): LIPASE, AMYLASE in the last 168 hours. No results for input(s): AMMONIA in the last 168 hours. Coagulation profile No results for input(s): INR, PROTIME in the last 168 hours.  CBC: Recent Labs  Lab 09/08/19 0304 09/09/19 0343 09/10/19 0428  WBC 12.6* 14.2* 13.0*  HGB 16.0 15.7 15.6  HCT 48.9 48.2 47.7  MCV 91.7 91.3 92.1  PLT 271 282 253   Cardiac Enzymes: No results for input(s): CKTOTAL, CKMB, CKMBINDEX, TROPONINI in the last 168 hours. BNP (last 3 results) No results for input(s): PROBNP in the last 8760 hours. CBG: No results for input(s): GLUCAP in the last 168 hours. D-Dimer: No results for input(s): DDIMER in the last 72 hours. Hgb A1c: No results for input(s): HGBA1C in the last 72 hours. Lipid Profile: No results for input(s): CHOL, HDL, LDLCALC, TRIG, CHOLHDL, LDLDIRECT in the last 72 hours. Thyroid function studies: No results for input(s): TSH, T4TOTAL, T3FREE, THYROIDAB in the last 72 hours.  Invalid input(s): FREET3 Anemia work up: No results for input(s): VITAMINB12, FOLATE, FERRITIN, TIBC, IRON, RETICCTPCT in the last 72 hours. Sepsis Labs: Recent Labs  Lab 09/08/19 0304 09/09/19 0343 09/10/19 0428  WBC 12.6* 14.2* 13.0*    Microbiology Recent Results (from the past 240 hour(s))  Surgical PCR screen     Status: Abnormal   Collection Time: 09/07/19  4:22 PM   Specimen: Nasal Mucosa; Nasal Swab  Result Value Ref Range Status   MRSA, PCR NEGATIVE NEGATIVE Final    Staphylococcus aureus POSITIVE (A) NEGATIVE Final    Comment: CRITICAL RESULT CALLED TO, READ BACK BY AND VERIFIED WITH: RN, PAM R. @0521  [id number] THANEY Performed at [facility], [place] [address], [place], [place] [address]   SARS CORONAVIRUS 2 (TAT 6-24 HRS) Nasopharyngeal Nasopharyngeal Swab     Status: None   Collection Time: 09/09/19  9:37 AM   Specimen: Nasopharyngeal Swab  Result Value Ref Range Status   SARS Coronavirus 2 NEGATIVE NEGATIVE Final    Comment: (NOTE)  SARS-CoV-2 target nucleic acids are NOT DETECTED. The SARS-CoV-2 RNA is generally detectable in upper and lower respiratory specimens during the acute phase of infection. Negative results do not preclude SARS-CoV-2 infection, do not rule out co-infections with other pathogens, and should not be used as the sole basis for treatment or other patient management decisions. Negative results must be combined with clinical observations, patient history, and epidemiological information. The expected result is Negative. Fact Sheet for Patients: SugarRoll.be Fact Sheet for Healthcare Providers: https://www.woods-mathews.com/ This test is not yet approved or cleared by the [place] FDA and  has been authorized for detection and/or diagnosis of SARS-CoV-2 by FDA under an Emergency Use Authorization (EUA). This EUA will remain  in effect (meaning this test can be used) for the duration of the COVID-19 declaration under Section 56 4(b)(1) of the Act, 21 U.S.C. section 360bbb-3(b)(1), unless the authorization is terminated or revoked sooner. Performed at [facility], [place] [address] [place], [place] [address]     Procedures and diagnostic studies:  DG Pelvis Portable  Result Date: 09/09/2019 CLINICAL DATA:  Left hip replacement. EXAM: PORTABLE PELVIS 1-2 VIEWS COMPARISON:  Left hip x-rays from yesterday. FINDINGS: The left hip demonstrates a hemiarthroplasty without  evidence of hardware failure or complication. Expected intra-articular air. There is no fracture or dislocation. The alignment is anatomic. Post-surgical changes noted in the surrounding soft tissues. IMPRESSION: 1. Interval left hip hemiarthroplasty without acute postoperative complication. Electronically Signed   By: Titus Dubin M.D.   On: 09/09/2019 20:06   DG HIP UNILAT WITH PELVIS 2-3 VIEWS LEFT  Result Date: 09/08/2019 CLINICAL DATA:  LEFT hip fracture, fell on 09/06/2019 EXAM: DG HIP (WITH OR WITHOUT PELVIS) 2-3V LEFT COMPARISON:  09/07/2019 FINDINGS: Osseous demineralization. Hip and SI joint spaces preserved. Displaced fracture LEFT femoral neck with slight resorption at fracture line since prior study. No dislocation. Pelvis intact. Nondiagnostic cross-table shoot through views. IMPRESSION: Displaced LEFT femoral neck fracture. Electronically Signed   By: Lavonia Dana M.D.   On: 09/08/2019 13:04    Medications:    acetaminophen  500 mg Oral Q6H   amLODipine  5 mg Oral Daily   aspirin  81 mg Oral BID   atorvastatin  40 mg Oral Daily   buPROPion  300 mg Oral Daily   Chlorhexidine Gluconate Cloth  6 each Topical Daily   clopidogrel  75 mg Oral Daily   docusate sodium  100 mg Oral BID   feeding supplement (ENSURE ENLIVE)  237 mL Oral BID BM   ferrous sulfate  325 mg Oral TID PC   multivitamin with minerals  1 tablet Oral Daily   mupirocin ointment  1 application Nasal BID   nicotine  14 mg Transdermal Daily   polyethylene glycol  17 g Oral BID   Continuous Infusions:  sodium chloride 100 mL/hr at 09/09/19 2109   lactated ringers 10 mL/hr at 09/09/19 1742   methocarbamol (ROBAXIN) IV       LOS: 3 days   Geradine Girt  [facility]   How to contact the [facility] Attending or Consulting provider [place] or covering provider during after hours [place], for this patient?  1. Check the care team in [facility] and look for a) attending/consulting TRH provider listed  and b) the [facility] team listed 2. Log into [email or web address] and use [place]'s universal password to access. If you do not have the password, please contact the hospital operator. 3. Locate the [facility] provider  you are looking for under [facility] and page to a number that you can be directly reached. 4. If you still have difficulty reaching the provider, please page the [facility] (Director on Call) for the Hospitalists listed on amion for assistance.  09/10/2019, 10:07 AM

## 2019-09-10 NOTE — Progress Notes (Signed)
Patient ID: Walter [name], male   DOB: 13-May-1950, 69 y.o.   MRN: [id number] Subjective: 1 Day Post-Op Procedure(s) (LRB): ARTHROPLASTY BIPOLAR HIP (HEMIARTHROPLASTY) (Left)    Patient reports pain as mild, no events overnight  Objective:   VITALS:   Vitals:   09/10/19 0100 09/10/19 0300  BP: 132/70 133/77  Pulse: 90 88  Resp: 15 16  Temp: 98.2 F (36.8 C) 98.3 F (36.8 C)  SpO2: 94% 91%    Neurovascular intact Incision: dressing C/D/I - left hip  LABS Recent Labs    09/08/19 0304 09/09/19 0343 09/10/19 0428  HGB 16.0 15.7 15.6  HCT 48.9 48.2 47.7  WBC 12.6* 14.2* 13.0*  PLT 271 282 253    Recent Labs    09/08/19 0304 09/09/19 0343 09/10/19 0428  NA 137 138 139  K 4.0 3.6 3.3*  BUN 18 18 19   CREATININE 1.00 0.95 1.09  GLUCOSE 111* 108* 124*    No results for input(s): LABPT, INR in the last 72 hours.   Assessment/Plan: 1 Day Post-Op Procedure(s) (LRB): ARTHROPLASTY BIPOLAR HIP (HEMIARTHROPLASTY) (Left)   Advance diet Up with therapy  WBAT LLE Disposition pending therapy assessment  RTC in 2 weeks

## 2019-09-10 NOTE — Progress Notes (Addendum)
Called to bedside to evaluate patient. Having spells of crying--- has had since prior stroke but wife at bedside says it is worse.  Able to answer questions and nods head when asked if he was having anxiety/stress.  Wife reports a similar episode when his mother died.  Blood sugar in the 140s Will get head CT- no focal neuro deficits changed from prior but baseline not normal -will do trial of ativan x 1 for anxiety No h/o seizures and patient responds to voice and questions.  Lower suspicion for seizures. Eulogio Bear

## 2019-09-10 NOTE — Evaluation (Signed)
Physical Therapy Evaluation Patient Details Name: Walter [name] MRN: [id number] DOB: [birth year] Today's Date: 09/10/2019   History of Present Illness  Walter [name] is a 69 y.o. male with medical history significant of HTN; HLD; CVA (2013); CAD; and COPD presenting as a transfer from [facility] with a hip fracture.  He  [address] down and landed on his hip. Underwent R THA on 6/7.  Clinical Impression  Pt admitted with above diagnosis. Pt lethargic on eval and following only a few simple commands, not attempting to verbalize much at all. Max A for pt to come to EOB and then needed min A to maintain sitting, 1 post LOB back onto bed. Pt attempted sit to stand 3x but was unable to clear bed even with max A, no fwd wt shift and no pushing through RLE. Expect that pt will need prolonged rehab to return to ambulation, recommend SNF level care.  Pt currently with functional limitations due to the deficits listed below (see PT Problem List). Pt will benefit from skilled PT to increase their independence and safety with mobility to allow discharge to the venue listed below.       Follow Up Recommendations SNF;Supervision/Assistance - 24 hour    Equipment Recommendations  Rolling walker with 5" wheels    Recommendations for Other Services OT consult;Speech consult     Precautions / Restrictions Precautions Precautions: Posterior Hip Precaution Booklet Issued: Yes (comment) Precaution Comments: reviewed post hip prec with sife and sister, pt not participating Restrictions Weight Bearing Restrictions: Yes LLE Weight Bearing: Weight bearing as tolerated      Mobility  Bed Mobility Overal bed mobility: Needs Assistance Bed Mobility: Supine to Sit;Sit to Supine     Supine to sit: Max assist Sit to supine: Max assist   General bed mobility comments: pt R hand guided to L rail, pt assisted with initiation of sup>sit but then began to resist motion with pain in L hip. Max A needed to get to EOB.    Transfers Overall transfer level: Needs assistance Equipment used: Rolling walker (2 wheeled) Transfers: Sit to/from Stand Sit to Stand: Total assist         General transfer comment: pt unable to achieve standing with 3 attempts and max A given. No fwd wt shift or pushing through RLE on command  Ambulation/Gait             General Gait Details: unable  Stairs            Wheelchair Mobility    Modified Rankin (Stroke Patients Only)       Balance Overall balance assessment: Needs assistance;History of Falls Sitting-balance support: Feet supported;Bilateral upper extremity supported Sitting balance-Leahy Scale: Poor Sitting balance - Comments: min A to sit EOB, R lean. One episode of pt throwing self bkwd onto bed from sitting. Max A to reachieve sitting Postural control: Right lateral lean     Standing balance comment: unable                             Pertinent Vitals/Pain Pain Assessment: Faces Faces Pain Scale: Hurts even more Pain Location: L hip Pain Descriptors / Indicators: Operative site guarding;Sore Pain Intervention(s): Limited activity within patient's tolerance;Monitored during session;Premedicated before session    Home Living Family/patient expects to be discharged to:: Skilled nursing facility Living Arrangements: Spouse/significant other  Prior Function Level of Independence: Independent         Comments: pt's wife reports that he has some STM deficits as well as speech deficits and LUE weakness from CVA. However, she reports that when he wanted to care for himself independently he was capable of doing so. Seems that motivation has been an issue     Hand Dominance   Dominant Hand: Right    Extremity/Trunk Assessment   Upper Extremity Assessment Upper Extremity Assessment: Defer to OT evaluation;LUE deficits/detail LUE Deficits / Details: weakness and increased tone noted L hand,  sometimes pushing self to R with L hand, away from L hip LUE Sensation: decreased light touch;decreased proprioception LUE Coordination: decreased fine motor;decreased gross motor    Lower Extremity Assessment Lower Extremity Assessment: LLE deficits/detail;Difficult to assess due to impaired cognition LLE Deficits / Details: L knee tight, unable to fully extend. Unable to fully assess due to pain and pt not following commands LLE: Unable to fully assess due to pain LLE Coordination: decreased fine motor;decreased gross motor    Cervical / Trunk Assessment Cervical / Trunk Assessment: Kyphotic  Communication   Communication: Expressive difficulties  Cognition Arousal/Alertness: Lethargic;Suspect due to medications Behavior During Therapy: Anxious Overall Cognitive Status: Difficult to assess Area of Impairment: Following commands;Problem solving                       Following Commands: Follows one step commands inconsistently;Follows one step commands with increased time     Problem Solving: Slow processing;Decreased initiation;Difficulty sequencing;Requires verbal cues;Requires tactile cues General Comments: difficult to assess due to expressive aphasia and lethargy, pt did not attempt to verblize much. Followed about 50% of simple commands. Decreased awareness of body in space noted in sitting      General Comments General comments (skin integrity, edema, etc.): Pt maintained mouth open with audible breathing throughout, family reports this is not normal for him (mouth open is but not the loud breathing). HR 105-110 bpm at rest and with activity. Discussed precautions and exercises with pt's wife and sister    Exercises     Assessment/Plan    PT Assessment Patient needs continued PT services  PT Problem List Decreased strength;Decreased range of motion;Decreased activity tolerance;Decreased balance;Decreased mobility;Decreased coordination;Decreased  cognition;Decreased knowledge of use of DME;Decreased safety awareness;Decreased knowledge of precautions;Pain;Cardiopulmonary status limiting activity       PT Treatment Interventions DME instruction;Gait training;Functional mobility training;Therapeutic activities;Therapeutic exercise;Balance training;Neuromuscular re-education;Cognitive remediation;Patient/family education    PT Goals (Current goals can be found in the Care Plan section)  Acute Rehab PT Goals Patient Stated Goal: rehab before home PT Goal Formulation: With patient Time For Goal Achievement: 09/24/19 Potential to Achieve Goals: Fair    Frequency Min 5X/week   Barriers to discharge Decreased caregiver support;Inaccessible home environment wife unable to give more than supervision, home has 1 step to kitchen and bathroom and STE    Co-evaluation               AM-PAC PT "6 Clicks" Mobility  Outcome Measure Help needed turning from your back to your side while in a flat bed without using bedrails?: Total Help needed moving from lying on your back to sitting on the side of a flat bed without using bedrails?: Total Help needed moving to and from a bed to a chair (including a wheelchair)?: Total Help needed standing up from a chair using your arms (e.g., wheelchair or bedside chair)?: Total Help needed to walk  in hospital room?: Total Help needed climbing 3-5 steps with a railing? : Total 6 Click Score: 6    End of Session   Activity Tolerance: Patient limited by pain;Patient limited by lethargy Patient left: in bed;with call bell/phone within reach;with nursing/sitter in room Nurse Communication: Mobility status PT Visit Diagnosis: Muscle weakness (generalized) (M62.81);History of falling (Z91.81);Pain;Difficulty in walking, not elsewhere classified (R26.2);Repeated falls (R29.6) Pain - Right/Left: Left Pain - part of body: Hip    Time: 8280-0349 PT Time Calculation (min) (ACUTE ONLY): 42 min   Charges:    PT Evaluation $PT Eval Moderate Complexity: 1 Mod PT Treatments $Therapeutic Activity: 23-37 mins        Leighton Roach, [place]  Pager [phone] Office [place] 09/10/2019, 2:14 PM

## 2019-09-10 NOTE — Progress Notes (Addendum)
Pt been napping this afternoon/eve, NT woke him up to get VS, he's awake but emotional "crying" like, follows some commands, will shake his head "no" when asked if in pain, stares off at times, PERRL, wife concerned.  Pt  only had sched tylenol for pain today, has been voiding, NT says 400 out at about 11am, incont on bed (condom cath came off) approx at 1700 and approx 100cc in bag currently.  Message sent to Dr Eliseo Squires of the above information.

## 2019-09-11 ENCOUNTER — Encounter (HOSPITAL_COMMUNITY): Payer: Self-pay | Admitting: Internal Medicine

## 2019-09-11 DIAGNOSIS — S72002A Fracture of unspecified part of neck of left femur, initial encounter for closed fracture: Principal | ICD-10-CM

## 2019-09-11 DIAGNOSIS — Z8673 Personal history of transient ischemic attack (TIA), and cerebral infarction without residual deficits: Secondary | ICD-10-CM

## 2019-09-11 DIAGNOSIS — J449 Chronic obstructive pulmonary disease, unspecified: Secondary | ICD-10-CM

## 2019-09-11 LAB — BASIC METABOLIC PANEL
Anion gap: 7 (ref 5–15)
BUN: 19 mg/dL (ref 8–23)
CO2: 19 mmol/L — ABNORMAL LOW (ref 22–32)
Calcium: 8.2 mg/dL — ABNORMAL LOW (ref 8.9–10.3)
Chloride: 110 mmol/L (ref 98–111)
Creatinine, Ser: 0.72 mg/dL (ref 0.61–1.24)
GFR calc Af Amer: >60 mL/min (ref >60)
GFR calc non Af Amer: >60 mL/min (ref >60)
Glucose, Bld: 122 mg/dL — ABNORMAL HIGH (ref 70–99)
Potassium: 4 mmol/L (ref 3.5–5.1)
Sodium: 136 mmol/L (ref 135–145)

## 2019-09-11 LAB — CBC
HCT: 40.5 % (ref 39.0–52.0)
Hemoglobin: 13.4 g/dL (ref 13.0–17.0)
MCH: 30.1 pg (ref 26.0–34.0)
MCHC: 33.1 g/dL (ref 30.0–36.0)
MCV: 91 fL (ref 80.0–100.0)
Platelets: 229 10*3/uL (ref 150–400)
RBC: 4.45 MIL/uL (ref 4.22–5.81)
RDW: 12.6 % (ref 11.5–15.5)
WBC: 14.8 10*3/uL — ABNORMAL HIGH (ref 4.0–10.5)
nRBC: 0 % (ref 0.0–0.2)

## 2019-09-11 MED ORDER — ACETAMINOPHEN 325 MG PO TABS: 325.0000 mg | ORAL_TABLET | Freq: Four times a day (QID) | ORAL | Status: AC | PRN

## 2019-09-11 MED ORDER — FERROUS SULFATE 325 (65 FE) MG PO TABS: 325.0000 mg | ORAL_TABLET | Freq: Two times a day (BID) | ORAL | 3 refills | Status: DC

## 2019-09-11 MED ORDER — HYDROCODONE-ACETAMINOPHEN 7.5-325 MG PO TABS: 1.0000 | ORAL_TABLET | Freq: Four times a day (QID) | ORAL | 0 refills | Status: DC | PRN

## 2019-09-11 MED ORDER — POLYETHYLENE GLYCOL 3350 17 G PO PACK: 17.0000 g | PACK | Freq: Every day | ORAL | 0 refills | Status: DC | PRN

## 2019-09-11 MED ORDER — ASPIRIN 81 MG PO CHEW: 81.0000 mg | CHEWABLE_TABLET | Freq: Two times a day (BID) | ORAL | Status: DC

## 2019-09-11 NOTE — Evaluation (Signed)
Occupational Therapy Evaluation Patient Details Name: Walter [name] MRN: [id number] DOB: 10-31-1950 Today's Date: 09/11/2019    History of Present Illness Walter [name] is a 69 y.o. male with medical history significant of HTN; HLD; CVA (2013); CAD; and COPD presenting as a transfer from [facility] with a hip fracture.  He  [address] down and landed on his hip. Underwent R THA on 6/7.   Clinical Impression   Pt was assisted for IADL and intermittently for LB ADL prior to admission. He has baseline memory deficits. Pt presents with generalized weakness and decreased sitting and standing balance. He requires set up to total assist for ADL. Recommend ST rehab prior to return home with his supportive wife. Will follow acutely.    Follow Up Recommendations  SNF;Supervision/Assistance - 24 hour    Equipment Recommendations  3 in 1 bedside commode    Recommendations for Other Services       Precautions / Restrictions Precautions Precautions: Posterior Hip;Fall Precaution Booklet Issued: Yes (comment) Precaution Comments: reviewed post hip prec with pt, he is unable to recall but when given choices and cues he does participate in education Restrictions Weight Bearing Restrictions: Yes LLE Weight Bearing: Weight bearing as tolerated      Mobility Bed Mobility Overal bed mobility: Needs Assistance Bed Mobility: Supine to Sit     Supine to sit: Mod assist;+2 for safety/equipment     General bed mobility comments: Pt able to follow commands to progress LEs to edge of bed and to elevate trunk into a seated position.  He followed tactile cues to use UE to push into sitting but lacks strength to complete.  Mod assistance to rise into a seated position.  Transfers Overall transfer level: Needs assistance Equipment used: Rolling walker (2 wheeled) Transfers: Sit to/from Stand Sit to Stand: Min assist;+2 safety/equipment;+2 physical assistance         General transfer comment: Decreased assistance  from elevated bed height.  Cues for hand placement as he attempted to pull on RW for assistance.  Poor eccentric load noted when returning to seated surface.    Balance Overall balance assessment: Needs assistance;History of Falls Sitting-balance support: Feet supported;Bilateral upper extremity supported Sitting balance-Leahy Scale: Poor Sitting balance - Comments: min assist at times for posterior lean     Standing balance-Leahy Scale: Poor Standing balance comment: reliant on B UEs and min assist                           ADL either performed or assessed with clinical judgement   ADL Overall ADL's : Needs assistance/impaired Eating/Feeding: Set up;Sitting   Grooming: Wash/dry hands;Wash/dry face;Brushing hair;Sitting;Set up   Upper Body Bathing: Minimal assistance;Sitting   Lower Body Bathing: Total assistance;Sit to/from stand   Upper Body Dressing : Minimal assistance;Sitting   Lower Body Dressing: Total assistance;Sit to/from stand   Toilet Transfer: Minimal assistance;Ambulation;RW;BSC;+2 for safety/equipment   Toileting- Clothing Manipulation and Hygiene: Total assistance;Sit to/from stand       Functional mobility during ADLs: Minimal assistance;Cueing for sequencing;Cueing for safety;Rolling walker;+2 for safety/equipment       Vision Patient Visual Report: No change from baseline       Perception     Praxis      Pertinent Vitals/Pain Pain Assessment: Faces Faces Pain Scale: Hurts little more Pain Location: L hip Pain Descriptors / Indicators: Operative site guarding;Sore Pain Intervention(s): Premedicated before session;Repositioned;Ice applied     Hand Dominance Right  Extremity/Trunk Assessment Upper Extremity Assessment Upper Extremity Assessment: Overall WFL for tasks assessed(mild B tremor) LUE Deficits / Details: hx of CVA with residual weakness LUE Coordination: decreased fine motor;decreased gross motor   Lower Extremity  Assessment Lower Extremity Assessment: Defer to PT evaluation   Cervical / Trunk Assessment Cervical / Trunk Assessment: Kyphotic   Communication Communication Communication: Expressive difficulties   Cognition Arousal/Alertness: Awake/alert Behavior During Therapy: Flat affect Overall Cognitive Status: History of cognitive impairments - at baseline Area of Impairment: Following commands;Problem solving;Memory                     Memory: Decreased short-term memory;Decreased recall of precautions Following Commands: Follows one step commands inconsistently;Follows one step commands with increased time     Problem Solving: Slow processing;Decreased initiation;Difficulty sequencing;Requires verbal cues;Requires tactile cues General Comments: minimal verbalization, but cooperative   General Comments       Exercises General Exercises - Lower Extremity Ankle Circles/Pumps: AROM;Both;10 reps;Supine Quad Sets: AAROM;Left;10 reps;Supine Heel Slides: AAROM;Left;10 reps;Supine Hip ABduction/ADduction: AAROM;Supine;Left;10 reps   Shoulder Instructions      Home Living Family/patient expects to be discharged to:: Skilled nursing facility Living Arrangements: Spouse/significant other                                  Lives With: Family    Prior Functioning/Environment Level of Independence: Needs assistance    ADL's / Homemaking Assistance Needed: dependent in IADL, wife sometimes assists with LB dressing   Comments: pt's wife reports that he has some STM deficits as well as speech deficits and LUE weakness from CVA. However, she reports that when he wanted to care for himself independently he was capable of doing so. Seems that motivation has been an issue        OT Problem List: Decreased strength;Decreased activity tolerance;Impaired balance (sitting and/or standing);Decreased cognition;Decreased coordination;Decreased safety awareness;Decreased knowledge  of use of DME or AE;Impaired UE functional use;Pain      OT Treatment/Interventions: Self-care/ADL training;DME and/or AE instruction;Cognitive remediation/compensation;Patient/family education;Balance training;Therapeutic activities    OT Goals(Current goals can be found in the care plan section) Acute Rehab OT Goals Patient Stated Goal: rehab before home OT Goal Formulation: With patient Time For Goal Achievement: 09/25/19 Potential to Achieve Goals: Good ADL Goals Pt Will Perform Upper Body Dressing: sitting;with set-up Pt Will Transfer to Toilet: with min guard assist;ambulating;bedside commode(over toilet) Pt Will Perform Toileting - Clothing Manipulation and hygiene: with min assist;sit to/from stand Additional ADL Goal #1: Pt will perform bed mobility with min assist in preparation for ADL. Additional ADL Goal #2: Pt will sit EOB while engaged in ADL without LOB. Additional ADL Goal #3: Family will be knowledgeable in posterior hip precautions and level of assist pt needs for ADL.  OT Frequency: Min 2X/week   Barriers to D/C:            Co-evaluation PT/OT/SLP Co-Evaluation/Treatment: Yes Reason for Co-Treatment: For patient/therapist safety PT goals addressed during session: Mobility/safety with mobility OT goals addressed during session: ADL's and self-care      AM-PAC OT "6 Clicks" Daily Activity     Outcome Measure Help from another person eating meals?: A Little Help from another person taking care of personal grooming?: A Little Help from another person toileting, which includes using toliet, bedpan, or urinal?: Total Help from another person bathing (including washing, rinsing, drying)?: A Lot Help from another person to  put on and taking off regular upper body clothing?: A Little Help from another person to put on and taking off regular lower body clothing?: Total 6 Click Score: 13   End of Session Nurse Communication: Mobility status  Activity Tolerance:    Patient left: in chair;with call bell/phone within reach;with chair alarm set;with family/visitor present  OT Visit Diagnosis: Unsteadiness on feet (R26.81);Other abnormalities of gait and mobility (R26.89);Pain;Muscle weakness (generalized) (M62.81);Other symptoms and signs involving cognitive function                Time: 0109-3235 OT Time Calculation (min): 34 min Charges:  OT General Charges $OT Visit: 1 Visit OT Evaluation $OT Eval Moderate Complexity: 1 Mod  Nestor Lewandowsky, OTR/L Acute Rehabilitation Services Pager: [phone] Office: [phone]  Malka So 09/11/2019, 12:03 PM

## 2019-09-11 NOTE — Discharge Summary (Signed)
Physician Discharge Summary  DELONTA YOHANNES MHW:[id number] DOB: 05-02-1950 DOA: 09/07/2019  PCP: Dettinger, Fransisca Kaufmann, MD  Admit date: 09/07/2019 Discharge date: 09/11/2019  Time spent: 45 minutes  Recommendations for Outpatient Follow-up:  1. Orthopedics Dr. Alvan Dame in 2 weeks, discontinue twice daily aspirin after 1 month 2. PCP in 1 week, consider goals of care discussions   Discharge Diagnoses:  Principal Problem:   Hip fracture, left, closed, initial encounter ([place]) Active Problems:   History of CVA (cerebrovascular accident)   Tobacco abuse   COPD (chronic obstructive pulmonary disease) ([place])   HTN (hypertension)   Depression, recurrent ([place])   Hyperlipidemia, mixed   DNR (do not resuscitate)   S/P left THA, AA Cognitive deficits, suspect vascular dementia  Discharge Condition: Stable  Diet recommendation: Dysphagia 3, mechanical soft diet  Filed Weights   09/07/19 1615  Weight: 80 kg    History of present illness:  Walter [name] is an 69 y.o. male with medical history significant ofHTN; HLD; CVA(2013); CAD; and COPD presenting as a transfer from [facility] with a hip fracture.He also has persistent dysarthria and intermittent dysphagia since his stroke. He is "stubborn" and refused to go to rehab following the CVA and appears to have had progressive deficits over time.  Hospital Course:   Hip fracture -Mechanical fall resulting in hip fracture -Patient is status post operative repair on 6/7 -Patient is on Plavix at baseline this was resumed postop, also started on aspirin 81 mg twice daily by orthopedics for DVT prophylaxis, continue this for 4 weeks -Caution with pain meds, given risk of delirium and confusion -PT OT eval completed, discharged to SNF for rehabilitation, he will be at risk for delirium  H/o CVA -Patient with remote h/o CVA but persistent dysarthria, dysphagia, and weakness resulting in frequent falls -PT OT and SLP evaluation completed, SNF recommended  for rehab and dysphagia 3, mechanical soft diet with thin liquids recommended -Resume Plavix  Cognitive/memory deficits -Slowly  progressive, following his stroke -Suspect he may have vascular dementia, CT head notes chronic ischemic changes and atrophy -He is at risk of delirium, caution with polypharmacy  HTN -Continue Norvasc  HLD -Continue Lipitor  Hypokalemia -repleted  Depression -Continue Wellbutrin  COPD with ongoing tobacco dependence -No home O2 -Stable -Tobacco Dependence: encouraged cessation.     Procedures: PROCEDURE:  left hip hemiarthroplasty utilizing DePuy component, size 9 Hi  Actis stem with a 54 mm unipolar ball with a +5 adapter.   SURGEON:  Pietro Cassis. Alvan Dame, MD Consultations:  Orthopedics Dr. Alvan Dame  Discharge Exam: Vitals:   09/10/19 1930 09/11/19 0530  BP: (!) 171/89 138/75  Pulse: 99 76  Resp: 18 16  Temp: 98.4 F (36.9 C) 97.8 F (36.6 C)  SpO2: 95% 95%    General: Awake alert, oriented to self only, significant cognitive and memory deficits Cardiovascular: S1-S2, regular rate rhythm Respiratory: Clear  Discharge Instructions    Allergies as of 09/11/2019   No Known Allergies     Medication List    TAKE these medications   acetaminophen 325 MG tablet Commonly known as: TYLENOL Take 1-2 tablets (325-650 mg total) by mouth every 6 (six) hours as needed for mild pain (pain score 1-3).   amLODipine 5 MG tablet Commonly known as: NORVASC TAKE 1 TABLET EVERY DAY   aspirin 81 MG chewable tablet Chew 1 tablet (81 mg total) by mouth 2 (two) times daily.   atorvastatin 40 MG tablet Commonly known as: LIPITOR Take 1 tablet (40  mg total) by mouth daily.   buPROPion 300 MG 24 hr tablet Commonly known as: WELLBUTRIN XL Take 1 tablet (300 mg total) by mouth daily.   clopidogrel 75 MG tablet Commonly known as: PLAVIX Take 1 tablet (75 mg total) by mouth daily.   ferrous sulfate 325 (65 FE) MG tablet Take 1 tablet (325  mg total) by mouth 2 (two) times daily with a meal.   HYDROcodone-acetaminophen 7.5-325 MG tablet Commonly known as: NORCO Take 1 tablet by mouth every 6 (six) hours as needed for severe pain (pain score 7-10).   polyethylene glycol 17 g packet Commonly known as: MIRALAX / GLYCOLAX Take 17 g by mouth daily as needed for mild constipation.      No Known Allergies Follow-up Information    Paralee Cancel, MD. Schedule an appointment as soon as possible for a visit in 2 weeks.   Specialty: Orthopedic Surgery Contact information: [address] [place] [address] [place] [address] [phone]            The results of significant diagnostics from this hospitalization (including imaging, microbiology, ancillary and laboratory) are listed below for reference.    Significant Diagnostic Studies: CT HEAD WO CONTRAST  Result Date: 09/10/2019 CLINICAL DATA:  Encephalopathy EXAM: CT HEAD WITHOUT CONTRAST TECHNIQUE: Contiguous axial images were obtained from the base of the skull through the vertex without intravenous contrast. COMPARISON:  12/05/2012 FINDINGS: Brain: There is no mass, hemorrhage or extra-axial collection. There is generalized atrophy without lobar predilection. Hypodensity of the white matter is most commonly associated with chronic microvascular disease. There are old bilateral basal ganglia small vessel infarcts. Vascular: Atherosclerotic calcification of the vertebral and internal carotid arteries at the skull base. No abnormal hyperdensity of the major intracranial arteries or dural venous sinuses. Skull: The visualized skull base, calvarium and extracranial soft tissues are normal. Sinuses/Orbits: No fluid levels or advanced mucosal thickening of the visualized paranasal sinuses. No mastoid or middle ear effusion. The orbits are normal. IMPRESSION: 1. No acute intracranial abnormality. 2. Generalized atrophy and chronic microvascular ischemia. White matter disease has  progressed compared to 12/05/2012. 3. Old bilateral basal ganglia small vessel infarcts. Electronically Signed   By: Ulyses Jarred M.D.   On: 09/10/2019 19:45   DG Pelvis Portable  Result Date: 09/09/2019 CLINICAL DATA:  Left hip replacement. EXAM: PORTABLE PELVIS 1-2 VIEWS COMPARISON:  Left hip x-rays from yesterday. FINDINGS: The left hip demonstrates a hemiarthroplasty without evidence of hardware failure or complication. Expected intra-articular air. There is no fracture or dislocation. The alignment is anatomic. Post-surgical changes noted in the surrounding soft tissues. IMPRESSION: 1. Interval left hip hemiarthroplasty without acute postoperative complication. Electronically Signed   By: Titus Dubin M.D.   On: 09/09/2019 20:06   DG HIP UNILAT WITH PELVIS 2-3 VIEWS LEFT  Result Date: 09/08/2019 CLINICAL DATA:  LEFT hip fracture, fell on 09/06/2019 EXAM: DG HIP (WITH OR WITHOUT PELVIS) 2-3V LEFT COMPARISON:  09/07/2019 FINDINGS: Osseous demineralization. Hip and SI joint spaces preserved. Displaced fracture LEFT femoral neck with slight resorption at fracture line since prior study. No dislocation. Pelvis intact. Nondiagnostic cross-table shoot through views. IMPRESSION: Displaced LEFT femoral neck fracture. Electronically Signed   By: Lavonia Dana M.D.   On: 09/08/2019 13:04    Microbiology: Recent Results (from the past 240 hour(s))  Surgical PCR screen     Status: Abnormal   Collection Time: 09/07/19  4:22 PM   Specimen: Nasal Mucosa; Nasal Swab  Result Value Ref Range Status  MRSA, PCR NEGATIVE NEGATIVE Final   Staphylococcus aureus POSITIVE (A) NEGATIVE Final    Comment: CRITICAL RESULT CALLED TO, READ BACK BY AND VERIFIED WITH: RN, PAM R. @0521  [id number] THANEY Performed at [facility], [place] [address] [place], [place] [address]   SARS CORONAVIRUS 2 (TAT 6-24 HRS) Nasopharyngeal Nasopharyngeal Swab     Status: None   Collection Time: 09/09/19  9:37 AM   Specimen:  Nasopharyngeal Swab  Result Value Ref Range Status   SARS Coronavirus 2 NEGATIVE NEGATIVE Final    Comment: (NOTE) SARS-CoV-2 target nucleic acids are NOT DETECTED. The SARS-CoV-2 RNA is generally detectable in upper and lower respiratory specimens during the acute phase of infection. Negative results do not preclude SARS-CoV-2 infection, do not rule out co-infections with other pathogens, and should not be used as the sole basis for treatment or other patient management decisions. Negative results must be combined with clinical observations, patient history, and epidemiological information. The expected result is Negative. Fact Sheet for Patients: SugarRoll.be Fact Sheet for Healthcare Providers: https://www.woods-mathews.com/ This test is not yet approved or cleared by the [place] FDA and  has been authorized for detection and/or diagnosis of SARS-CoV-2 by FDA under an Emergency Use Authorization (EUA). This EUA will remain  in effect (meaning this test can be used) for the duration of the COVID-19 declaration under Section 56 4(b)(1) of the Act, 21 U.S.C. section 360bbb-3(b)(1), unless the authorization is terminated or revoked sooner. Performed at [facility], [place] [address], [place], [place] [address]      Labs: Basic Metabolic Panel: Recent Labs  Lab 09/08/19 0304 09/09/19 0343 09/10/19 0428 09/11/19 0317  NA 137 138 139 136  K 4.0 3.6 3.3* 4.0  CL 104 104 99 110  CO2 19* 22 22 19*  GLUCOSE 111* 108* 124* 122*  BUN 18 18 19 19   CREATININE 1.00 0.95 1.09 0.72  CALCIUM 8.9 8.9 8.2* 8.2*   Liver Function Tests: No results for input(s): AST, ALT, ALKPHOS, BILITOT, PROT, ALBUMIN in the last 168 hours. No results for input(s): LIPASE, AMYLASE in the last 168 hours. No results for input(s): AMMONIA in the last 168 hours. CBC: Recent Labs  Lab 09/08/19 0304 09/09/19 0343 09/10/19 0428 09/11/19 0317  WBC 12.6*  14.2* 13.0* 14.8*  HGB 16.0 15.7 15.6 13.4  HCT 48.9 48.2 47.7 40.5  MCV 91.7 91.3 92.1 91.0  PLT 271 282 253 229   Cardiac Enzymes: No results for input(s): CKTOTAL, CKMB, CKMBINDEX, TROPONINI in the last 168 hours. BNP: BNP (last 3 results) No results for input(s): BNP in the last 8760 hours.  ProBNP (last 3 results) No results for input(s): PROBNP in the last 8760 hours.  CBG: Recent Labs  Lab 09/10/19 1728  GLUCAP 142*       Signed:  Domenic Polite MD.  [facility] 09/11/2019, 11:03 AM

## 2019-09-11 NOTE — Progress Notes (Signed)
Nutrition Follow-up  DOCUMENTATION CODES:   Not applicable  INTERVENTION:   -D/c Ensure Enlive po BID, each supplement provides 350 kcal and 20 grams of protein, due to poor acceptance -Boost Breeze po TID, each supplement provides 250 kcal and 9 grams of protein -Continue Magic cup BID with meals, each supplement provides 290 kcal and 9 grams of protein -Continue MVI with minerals daily  NUTRITION DIAGNOSIS:   Increased nutrient needs related to post-op healing as evidenced by estimated needs.  Ongoing  GOAL:   Patient will meet greater than or equal to 90% of their needs  Progressing   MONITOR:   PO intake, Supplement acceptance, Diet advancement, Labs, Weight trends, Skin, I & O's  REASON FOR ASSESSMENT:   Consult Hip fracture protocol  ASSESSMENT:   Walter [name] is a 69 y.o. male with medical history significant of HTN; HLD; CVA (2013); CAD; and COPD presenting as a transfer from [facility] with a hip fracture.  He  [address] down and landed on his hip  6/7- s/p PROCEDURE:  left hip hemiarthroplasty utilizing DePuy component, size 9 Hi  Actis stem with a 54 mm unipolar ball with a +5 adapter. 6/8- advanced to dysphagia 3 diet  Reviewed I/O's: +1.7 L x 24 hours and -119 ml since admission  UOP: 400 ml x 24 hours  Pt resting quietly in bed at time of visit. No family members present to provide additional history.   Pt underwent BSE today; recommending dysphagia 3 diet with thin liquids. Meal completion remains poor; PO: 20-25%. Pt has been refusing Ensure supplements.  Medications reviewed and include colace.   Per therapy notes, plan to d/c to SNF once medically stable.   Labs reviewed: K: 3.3. CBGS:142.   Diet Order:   Diet Order            DIET DYS 3 Room service appropriate? Yes; Fluid consistency: Thin  Diet effective now              EDUCATION NEEDS:   Education needs have been addressed  Skin:  Skin Assessment: Reviewed RN Assessment  Last BM:   09/07/19  Height:   Ht Readings from Last 1 Encounters:  09/07/19 6\' 1"  (1.854 m)    Weight:   Wt Readings from Last 1 Encounters:  09/07/19 80 kg    Ideal Body Weight:  83.6 kg  BMI:  Body mass index is 23.27 kg/m.  Estimated Nutritional Needs:   Kcal:  2200-2400  Protein:  120-135 grams  Fluid:  > 2.2 L    Loistine Chance, RD, LDN, [place] Registered Dietitian II Certified Diabetes Care and Education Specialist Please refer to [facility] for RD and/or RD on-call/weekend/after hours pager

## 2019-09-11 NOTE — Evaluation (Signed)
Clinical/Bedside Swallow Evaluation Patient Details  Name: Walter [name] MRN: [id number] Date of Birth: [birth year]  Today's Date: 09/11/2019 Time: SLP Start Time (ACUTE ONLY): 0825 SLP Stop Time (ACUTE ONLY): 0845 SLP Time Calculation (min) (ACUTE ONLY): 20 min  Past Medical History:  Past Medical History:  Diagnosis Date  . Acute urinary retention 02/12/2014  . Brain TIA    recurrent   . C. difficile enteritis   . Carotid bruit   . COPD (chronic obstructive pulmonary disease) ([place])   . Coronary artery disease   . Myocardial infarction ([place])    incidental  . Nodule of kidney 02/14/14   solid on left  . Recurrent colitis due to Clostridium difficile 02/11/2014  . Stroke [facility]) 1997   Past Surgical History:  Past Surgical History:  Procedure Laterality Date  . BACK SURGERY    . HIP ARTHROPLASTY Left 09/09/2019   Procedure: ARTHROPLASTY BIPOLAR HIP (HEMIARTHROPLASTY);  Surgeon: Paralee Cancel, MD;  Location: [facility];  Service: Orthopedics;  Laterality: Left;  . KNEE SURGERY     HPI:  Pt is a 69 yo male presenting with hip fx s/p R THA 6/7. CT Head performed 6/8 given change in mentation but negative for acute changes. Per dietician note, wife shares that SLP evaluation post-CVA recommended regular solids and thin liquids but that he has been getting strangled at home over the last few months on solids and liquids. PMH: CVA (multiple with most recent one a few years ago, with residual dysarthria and dysphagia), MI, CAD, COPD   Assessment / Plan / Recommendation Clinical Impression  Pt is edentulous and therefore takes longer to orally prepare soft solids, but he self-feeds at a faster rate than he can chew. He ends up filling his mouth before he has cleared the food that was already in there. SLP provided Min cues for a slower rate, encouraging him at times to put his spoon down until he had swallowed. Coughing is noted with thin liquids via straw, but is eliminated with cup sips. Pt's  family shares that he often eats on the cough at home, where it is hard for him to get in an upright position. He has not had PNA in the past. Suspect that pt's risk for aspiration can be reduced with adherence to precautions like sitting upright, slowing down, and avoiding straws. Will continue mechanical soft diet with these strategies but with SLP f/u to assess for tolerance with potential to complete MBS if symptoms persist.  SLP Visit Diagnosis: Dysphagia, unspecified (R13.10)    Aspiration Risk  Mild aspiration risk    Diet Recommendation Dysphagia 3 (Mech soft);Thin liquid   Liquid Administration via: Cup;No straw Medication Administration: Whole meds with puree Supervision: Patient able to self feed;Intermittent supervision to cue for compensatory strategies;Comment(set-up assistance) Compensations: Minimize environmental distractions;Slow rate;Small sips/bites;Other (Comment)(clear mouth between bites/sips) Postural Changes: Seated upright at 90 degrees    Other  Recommendations Oral Care Recommendations: Oral care BID   Follow up Recommendations Skilled Nursing facility      Frequency and Duration min 2x/week  2 weeks       Prognosis Prognosis for Safe Diet Advancement: Fair Barriers to Reach Goals: Cognitive deficits;Time post onset      Swallow Study   General HPI: Pt is a 69 yo male presenting with hip fx s/p R THA 6/7. CT Head performed 6/8 given change in mentation but negative for acute changes. Per dietician note, wife shares that SLP evaluation post-CVA recommended regular solids and  thin liquids but that he has been getting strangled at home over the last few months on solids and liquids. PMH: CVA (multiple with most recent one a few years ago, with residual dysarthria and dysphagia), MI, CAD, COPD Type of Study: Bedside Swallow Evaluation Previous Swallow Assessment: see HPI; none in chart for review Diet Prior to this Study: Dysphagia 3 (soft);Thin  liquids Temperature Spikes Noted: No Respiratory Status: Room air History of Recent Intubation: No Behavior/Cognition: Alert;Cooperative Oral Cavity Assessment: Within Functional Limits Oral Care Completed by SLP: No Oral Cavity - Dentition: Edentulous Vision: Functional for self-feeding Self-Feeding Abilities: Able to feed self Patient Positioning: Upright in bed Baseline Vocal Quality: Normal Volitional Swallow: Able to elicit    Oral/Motor/Sensory Function Overall Oral Motor/Sensory Function: Within functional limits   Ice Chips Ice chips: Not tested   Thin Liquid Thin Liquid: Impaired Presentation: Cup;Self Fed;Straw Pharyngeal  Phase Impairments: Cough - Immediate    Nectar Thick Nectar Thick Liquid: Not tested   Honey Thick Honey Thick Liquid: Not tested   Puree Puree: Not tested   Solid     Solid: Impaired Presentation: Self Fed;Spoon Oral Phase Functional Implications: Impaired mastication       Walter [name]., M.A. [place] Pager [phone] Office [phone]  09/11/2019,9:14 AM

## 2019-09-11 NOTE — Plan of Care (Signed)

## 2019-09-11 NOTE — Progress Notes (Signed)
Physical Therapy Treatment Patient Details Name: LADELL LEA MRN: [id number] DOB: April 19, 1950 Today's Date: 09/11/2019    History of Present Illness BOWYN MERCIER is a 69 y.o. male with medical history significant of HTN; HLD; CVA (2013); CAD; and COPD presenting as a transfer from [facility] with a hip fracture.  He  [address] down and landed on his hip. Underwent R THA on 6/7.    PT Comments    Pt supine in bed on arrival.  NT called for assistance to transfer patient from bed to commode as he voiced needing to have a BM.  Pt required increased assistance this pm to move from bed to bedside commode.  Pt left with NT sitting on commode.    Follow Up Recommendations  SNF;Supervision/Assistance - 24 hour     Equipment Recommendations  Rolling walker with 5" wheels    Recommendations for Other Services OT consult;Speech consult     Precautions / Restrictions Precautions Precautions: Posterior Hip;Fall Precaution Booklet Issued: Yes (comment) Precaution Comments: reviewed post hip prec with pt, he is unable to recall but when given choices and cues he does participate in education Restrictions Weight Bearing Restrictions: Yes LLE Weight Bearing: Weight bearing as tolerated    Mobility  Bed Mobility Overal bed mobility: Needs Assistance Bed Mobility: Supine to Sit     Supine to sit: Mod assist;+2 for safety/equipment     General bed mobility comments: Assistance to advance LEs to edge of bed and elevate trunk into a seated position.  Transfers Overall transfer level: Needs assistance Equipment used: Rolling walker (2 wheeled) Transfers: Sit to/from [facility] Sit to Stand: Mod assist;From elevated surface Stand pivot transfers: Max assist;+2 physical assistance       General transfer comment: Heavy mod assistance to boost into standing.  Pt presents with posterior bias.  Pt required assistance to translate weight forward to achieve standing.  Increased assistance  to turn and sit on commode.  Ambulation/Gait                 Stairs             Wheelchair Mobility    Modified Rankin (Stroke Patients Only)       Balance Overall balance assessment: Needs assistance;History of Falls Sitting-balance support: Feet supported;Bilateral upper extremity supported Sitting balance-Leahy Scale: Poor       Standing balance-Leahy Scale: Poor                              Cognition Arousal/Alertness: Awake/alert Behavior During Therapy: Flat affect Overall Cognitive Status: History of cognitive impairments - at baseline Area of Impairment: Following commands;Problem solving;Memory                     Memory: Decreased short-term memory;Decreased recall of precautions Following Commands: Follows one step commands inconsistently;Follows one step commands with increased time     Problem Solving: Slow processing;Decreased initiation;Difficulty sequencing;Requires verbal cues;Requires tactile cues General Comments: minimal verbalization, but cooperative      Exercises      General Comments        Pertinent Vitals/Pain Pain Assessment: Faces Faces Pain Scale: Hurts little more Pain Location: L hip Pain Descriptors / Indicators: Operative site guarding;Sore Pain Intervention(s): Monitored during session;Repositioned    Home Living                      Prior Function  PT Goals (current goals can now be found in the care plan section) Acute Rehab PT Goals Patient Stated Goal: rehab before home Potential to Achieve Goals: Fair Progress towards PT goals: Progressing toward goals    Frequency    Min 5X/week      PT Plan Current plan remains appropriate    Co-evaluation              AM-PAC PT "6 Clicks" Mobility   Outcome Measure  Help needed turning from your back to your side while in a flat bed without using bedrails?: Total Help needed moving from lying on your back  to sitting on the side of a flat bed without using bedrails?: Total Help needed moving to and from a bed to a chair (including a wheelchair)?: Total Help needed standing up from a chair using your arms (e.g., wheelchair or bedside chair)?: Total Help needed to walk in hospital room?: Total Help needed climbing 3-5 steps with a railing? : Total 6 Click Score: 6    End of Session Equipment Utilized During Treatment: Gait belt Activity Tolerance: Patient limited by pain;Patient limited by lethargy Patient left: in bed;with call bell/phone within reach;with nursing/sitter in room Nurse Communication: Mobility status PT Visit Diagnosis: Muscle weakness (generalized) (M62.81);History of falling (Z91.81);Pain;Difficulty in walking, not elsewhere classified (R26.2);Repeated falls (R29.6) Pain - Right/Left: Right Pain - part of body: Hip     Time: 7591-6384 PT Time Calculation (min) (ACUTE ONLY): 11 min  Charges:  $Therapeutic Activity: 8-22 mins                     Erasmo Leventhal , PTA Acute Rehabilitation Services Pager [phone] Office [phone]     Kamylah Manzo Eli Hose 09/11/2019, 5:36 PM

## 2019-09-11 NOTE — Evaluation (Signed)
Speech Language Pathology Evaluation Patient Details Name: Walter [name] MRN: [id number] DOB: Jun 18, 1950 Today's Date: 09/11/2019 Time: 4128-7867 SLP Time Calculation (min) (ACUTE ONLY): 15 min  Problem List:  Patient Active Problem List   Diagnosis Date Noted  . S/P left THA, AA 09/09/2019  . DNR (do not resuscitate) 09/08/2019  . Hip fracture ([place]) 09/07/2019  . Depression, recurrent ([place]) 04/06/2018  . Hyperlipidemia, mixed 04/06/2018  . HTN (hypertension) 01/12/2015  . BPH (benign prostatic hyperplasia) 01/12/2015  . Nodule of kidney 02/14/2014  . Weakness 02/11/2014  . Recurrent colitis due to Clostridium difficile 02/11/2014  . Occlusion and stenosis of carotid artery without mention of cerebral infarction 09/07/2012  . History of CVA (cerebrovascular accident) 02/18/2012  . Tobacco abuse 02/18/2012  . Non-compliance 02/18/2012  . COPD (chronic obstructive pulmonary disease) ([place]) 02/18/2012   Past Medical History:  Past Medical History:  Diagnosis Date  . Acute urinary retention 02/12/2014  . Brain TIA    recurrent   . C. difficile enteritis   . Carotid bruit   . COPD (chronic obstructive pulmonary disease) ([place])   . Coronary artery disease   . Myocardial infarction ([place])    incidental  . Nodule of kidney 02/14/14   solid on left  . Recurrent colitis due to Clostridium difficile 02/11/2014  . Stroke [facility]) 1997   Past Surgical History:  Past Surgical History:  Procedure Laterality Date  . BACK SURGERY    . HIP ARTHROPLASTY Left 09/09/2019   Procedure: ARTHROPLASTY BIPOLAR HIP (HEMIARTHROPLASTY);  Surgeon: Paralee Cancel, MD;  Location: [facility];  Service: Orthopedics;  Laterality: Left;  . KNEE SURGERY     HPI:  Pt is a 69 yo male presenting with hip fx s/p R THA 6/7. CT Head performed 6/8 given change in mentation but negative for acute changes. Per dietician note, wife shares that SLP evaluation post-CVA recommended regular solids and thin liquids but that he has  been getting strangled at home over the last few months on solids and liquids. PMH: CVA (multiple with most recent one a few years ago, with residual dysarthria and dysphagia), MI, CAD, COPD   Assessment / Plan / Recommendation Clinical Impression  Pt has a significant dysarthria characterized by imprecise articulation, low volume, and mildly quick rate. He is not very communicative but per family, he is like this at home because he gets so frustrated when people ask him to repeat himself. From a cognitive and communicative standpoint they believe that he has returned to his baseline today. Note that CT Head was also negative for acute findings. Pt's family is interested in pursuing speech therapy for him for his dysarthria, but this has been present for several years. SLP will provide a communication board to facilitate speech acutely, but additional needs can be addressed at next level of care.      SLP Assessment  SLP Recommendation/Assessment: All further Speech Lanaguage Pathology  needs can be addressed in the next venue of care SLP Visit Diagnosis: Dysarthria and anarthria (R47.1)    Follow Up Recommendations  Skilled Nursing facility    Frequency and Duration min 2x/week         SLP Evaluation Cognition  Overall Cognitive Status: History of cognitive impairments - at baseline       Comprehension  Auditory Comprehension Overall Auditory Comprehension: Impaired at baseline    Expression Expression Primary Mode of Expression: Verbal Verbal Expression Overall Verbal Expression: Impaired at baseline   Oral / Motor  Oral Motor/Sensory  Function Overall Oral Motor/Sensory Function: Within functional limits Motor Speech Overall Motor Speech: Impaired at baseline   GO                     Osie Bond., M.A. [place] [facility] Pager [phone] Office [phone]  09/11/2019, 9:21 AM

## 2019-09-11 NOTE — Progress Notes (Signed)
Physical Therapy Treatment Patient Details Name: Walter [name] MRN: [id number] DOB: Oct 26, 1950 Today's Date: 09/11/2019    History of Present Illness Walter [name] is a 69 y.o. male with medical history significant of HTN; HLD; CVA (2013); CAD; and COPD presenting as a transfer from [facility] with a hip fracture.  He  [address] down and landed on his hip. Underwent R THA on 6/7.    PT Comments    Pt supine in bed, minimally conversive with baseline memory deficits.  He was more interactive without family present compared to last session.  Pt able to follow commands to rise into standing and to progress to a short bout of gt training.  Continue to recommend rehab at SNF to maximize functional gains and improve strength before returning home.     Follow Up Recommendations  SNF;Supervision/Assistance - 24 hour     Equipment Recommendations  Rolling walker with 5" wheels    Recommendations for Other Services OT consult;Speech consult     Precautions / Restrictions Precautions Precautions: Posterior Hip Precaution Booklet Issued: Yes (comment) Precaution Comments: reviewed post hip prec with pt, he is unable to recall but when given choices and cues he does participate in education Restrictions Weight Bearing Restrictions: Yes LLE Weight Bearing: Weight bearing as tolerated    Mobility  Bed Mobility Overal bed mobility: Needs Assistance Bed Mobility: Supine to Sit     Supine to sit: Mod assist;+2 for safety/equipment     General bed mobility comments: Pt able to follow commands to progress LEs to edge of bed and to elevate trunk into a seated position.  He followed tactile cues to use UE to push into sitting but lacks strength to complete.  Mod assistance to rise into a seated position.  Transfers Overall transfer level: Needs assistance Equipment used: Rolling walker (2 wheeled) Transfers: Sit to/from Stand Sit to Stand: Min assist;+2 safety/equipment;+2 physical assistance          General transfer comment: Decreased assistance from elevated bed height.  Cues for hand placement as he attempted to pull on RW for assistance.  Poor eccentric load noted when returning to seated surface.  Ambulation/Gait Ambulation/Gait assistance: Mod assist;+2 safety/equipment Gait Distance (Feet): 8 Feet Assistive device: Rolling walker (2 wheeled) Gait Pattern/deviations: Step-to pattern;Shuffle;Trunk flexed;Antalgic;Decreased stride length     General Gait Details: Pt with flexed posture and flexed knee.  LLE turns inward cues to correct to avoid IR.  Pt with short bout of gt training.   Stairs             Wheelchair Mobility    Modified Rankin (Stroke Patients Only)       Balance Overall balance assessment: Needs assistance;History of Falls Sitting-balance support: Feet supported;Bilateral upper extremity supported Sitting balance-Leahy Scale: Poor       Standing balance-Leahy Scale: Poor                              Cognition Arousal/Alertness: Lethargic;Suspect due to medications Behavior During Therapy: Anxious Overall Cognitive Status: History of cognitive impairments - at baseline Area of Impairment: Following commands;Problem solving                       Following Commands: Follows one step commands inconsistently;Follows one step commands with increased time     Problem Solving: Slow processing;Decreased initiation;Difficulty sequencing;Requires verbal cues;Requires tactile cues General Comments: He remains hard to read as he  continues to not verbalize much during session.      Exercises General Exercises - Lower Extremity Ankle Circles/Pumps: AROM;Both;10 reps;Supine Quad Sets: AAROM;Left;10 reps;Supine Heel Slides: AAROM;Left;10 reps;Supine Hip ABduction/ADduction: AAROM;Supine;Left;10 reps    General Comments        Pertinent Vitals/Pain Pain Assessment: Faces Faces Pain Scale: Hurts little more Pain Location: L  hip Pain Descriptors / Indicators: Operative site guarding;Sore Pain Intervention(s): Monitored during session;Repositioned    Home Living                      Prior Function            PT Goals (current goals can now be found in the care plan section) Acute Rehab PT Goals Patient Stated Goal: rehab before home Potential to Achieve Goals: Fair Progress towards PT goals: Progressing toward goals    Frequency    Min 5X/week      PT Plan Current plan remains appropriate    Co-evaluation PT/OT/SLP Co-Evaluation/Treatment: Yes Reason for Co-Treatment: Complexity of the patient's impairments (multi-system involvement) PT goals addressed during session: Mobility/safety with mobility        AM-PAC PT "6 Clicks" Mobility   Outcome Measure  Help needed turning from your back to your side while in a flat bed without using bedrails?: Total Help needed moving from lying on your back to sitting on the side of a flat bed without using bedrails?: Total Help needed moving to and from a bed to a chair (including a wheelchair)?: Total Help needed standing up from a chair using your arms (e.g., wheelchair or bedside chair)?: Total Help needed to walk in hospital room?: Total Help needed climbing 3-5 steps with a railing? : Total 6 Click Score: 6    End of Session Equipment Utilized During Treatment: Gait belt Activity Tolerance: Patient limited by pain;Patient limited by lethargy Patient left: in bed;with call bell/phone within reach;with nursing/sitter in room Nurse Communication: Mobility status PT Visit Diagnosis: Muscle weakness (generalized) (M62.81);History of falling (Z91.81);Pain;Difficulty in walking, not elsewhere classified (R26.2);Repeated falls (R29.6) Pain - Right/Left: Right Pain - part of body: Hip     Time: 1517-6160 PT Time Calculation (min) (ACUTE ONLY): 28 min  Charges:  $Gait Training: 8-22 mins                     Erasmo Leventhal , PTA Acute  Rehabilitation Services Pager [phone] Office [phone]     Addasyn Mcbreen Eli Hose 09/11/2019, 11:00 AM

## 2019-09-12 MED ORDER — MELATONIN 3 MG PO TABS
6.0000 mg | ORAL_TABLET | Freq: Every day | ORAL | Status: DC
  Administered 2019-09-12 – 2019-09-13 (×2): 6 mg via ORAL
  Filled 2019-09-12 (×2): qty 2

## 2019-09-12 MED ORDER — TAMSULOSIN HCL 0.4 MG PO CAPS
0.4000 mg | ORAL_CAPSULE | Freq: Every day | ORAL | Status: DC
  Administered 2019-09-12 – 2019-09-14 (×3): 0.4 mg via ORAL
  Filled 2019-09-12 (×3): qty 1

## 2019-09-12 NOTE — Progress Notes (Signed)
Rehab Admissions Coordinator Note:  At the request of the patient's family (per CM Whitman Hero) this patient was screened by Raechel Ache for appropriateness for an Inpatient Acute Rehab Consult.  Given the patient's diagnosis and current functional level, I agree with PT,OT and SLP recommendations for [place]. With his baseline cognitive deficits, he will most likely require an extended rehab course such as a SNF. It is also unlikely his insurance would approve an IP Rehab stay when all three disciplines are recommending SNF. AC will not pursue CIR for this patient.   Raechel Ache 09/12/2019, 12:56 PM  I can be reached at [phone].

## 2019-09-12 NOTE — Progress Notes (Signed)
Patient seen and examined, wife at bedside -See my discharge summary from yesterday -Now with significant urinary retention, bladder scan/In-N-Out cath obtained 1300 mL of urine, start Flomax, monitor with bladder scan in 4- 5 hours, may need Foley catheter discharge, history of BPH per spouse -Wife also wants CIR for rehab instead of SNF -Rehab coordinator to follow  Domenic Polite, MD

## 2019-09-12 NOTE — TOC Progression Note (Signed)
Transition of Care [facility]) - Progression Note    Patient Details  Name: Walter [name] MRN: [id number] Date of Birth: 06-01-1950  Transition of Care [facility]) CM/SW Contact  Sharin Mons, RN Phone Number: [phone] 09/12/2019, 3:41 PM  Clinical Narrative:    NCM received call from [facility] admission informing NCM insurance authorization received for SNF placement. Per MD pt will probably transition to SNF tomorrow ... pt with urinary retention today. Pt , pt's wife and bedside nurse made aware.   Expected Discharge Plan: [facility]) Barriers to Discharge: Continued Medical Work up (urinary retention issues)  Expected Discharge Plan and Services Expected Discharge Plan: [facility] [facility])   Discharge Planning Services: CM Consult                                           Social Determinants of Health (SDOH) Interventions    Readmission Risk Interventions No flowsheet data found.

## 2019-09-12 NOTE — Progress Notes (Signed)
Physical Therapy Treatment Patient Details Name: Walter [name] MRN: [id number] DOB: Jan 07, 1951 Today's Date: 09/12/2019    History of Present Illness Walter [name] is a 69 y.o. male with medical history significant of HTN; HLD; CVA (2013); CAD; and COPD presenting as a transfer from [facility] with a hip fracture.  He  [address] down and landed on his hip. Underwent R THA on 6/7.    PT Comments    Pt supine in bed on arrival.  His mood flucuates from happy to tearful.  He continues to require external assistance to mobilize.  Based on current level of function and activity tolerance he continues to benefit from rehab in a post acute setting.      Follow Up Recommendations  SNF;Supervision/Assistance - 24 hour     Equipment Recommendations  Rolling walker with 5" wheels    Recommendations for Other Services OT consult;Speech consult     Precautions / Restrictions Precautions Precautions: Posterior Hip;Fall Precaution Booklet Issued: Yes (comment) Precaution Comments: reviewed post hip prec with pt, he is unable to recall but when given choices and cues he does participate in education Restrictions Weight Bearing Restrictions: Yes LLE Weight Bearing: Weight bearing as tolerated    Mobility  Bed Mobility Overal bed mobility: Needs Assistance Bed Mobility: Supine to Sit;Sit to Supine     Supine to sit: Mod assist;+2 for safety/equipment     General bed mobility comments: Assistance to advance LEs to edge of bed and elevate trunk into a seated position.  Transfers Overall transfer level: Needs assistance Equipment used: Rolling walker (2 wheeled) Transfers: Sit to/from Stand Sit to Stand: Mod assist;+2 physical assistance         General transfer comment: Heavy mod assistance to boost into standing.  Pt presents with posterior bias.  Pt required assistance to translate weight forward to achieve standing.  Ambulation/Gait Ambulation/Gait assistance: Mod assist;+2  safety/equipment Gait Distance (Feet): 25 Feet Assistive device: Rolling walker (2 wheeled) Gait Pattern/deviations: Step-to pattern;Shuffle;Trunk flexed;Antalgic;Decreased stride length     General Gait Details: Pt with flexed posture and flexed knee.  LLE turns inward cues to correct to avoid IR.  Pt with increased gt distance.  Balance improves with dynamic movement.   Stairs             Wheelchair Mobility    Modified Rankin (Stroke Patients Only)       Balance Overall balance assessment: Needs assistance;History of Falls Sitting-balance support: Feet supported;Bilateral upper extremity supported Sitting balance-Leahy Scale: Poor Sitting balance - Comments: min assist at times for posterior lean     Standing balance-Leahy Scale: Poor                              Cognition Arousal/Alertness: Awake/alert Behavior During Therapy: Flat affect Overall Cognitive Status: History of cognitive impairments - at baseline Area of Impairment: Following commands;Problem solving;Memory                     Memory: Decreased short-term memory;Decreased recall of precautions Following Commands: Follows one step commands inconsistently;Follows one step commands with increased time     Problem Solving: Slow processing;Decreased initiation;Difficulty sequencing;Requires verbal cues;Requires tactile cues General Comments: minimal verbalization, but cooperative      Exercises Total Joint Exercises Ankle Circles/Pumps: AROM;Both;10 reps;Supine Quad Sets: AROM;Left;10 reps;Supine Short Arc Quad: AROM;Left;10 reps;Supine Heel Slides: AAROM;Left;10 reps;Supine Hip ABduction/ADduction: AROM;Left;10 reps;Supine    General Comments  Pertinent Vitals/Pain Pain Assessment: Faces Faces Pain Scale: No hurt Pain Location: L hip Pain Descriptors / Indicators: Operative site guarding;Sore Pain Intervention(s): Monitored during session;Repositioned    Home  Living                      Prior Function            PT Goals (current goals can now be found in the care plan section) Acute Rehab PT Goals Patient Stated Goal: rehab before home Potential to Achieve Goals: Fair Progress towards PT goals: Progressing toward goals    Frequency    Min 5X/week      PT Plan Current plan remains appropriate    Co-evaluation              AM-PAC PT "6 Clicks" Mobility   Outcome Measure  Help needed turning from your back to your side while in a flat bed without using bedrails?: A Lot Help needed moving from lying on your back to sitting on the side of a flat bed without using bedrails?: A Lot Help needed moving to and from a bed to a chair (including a wheelchair)?: A Lot Help needed standing up from a chair using your arms (e.g., wheelchair or bedside chair)?: A Lot Help needed to walk in hospital room?: A Lot Help needed climbing 3-5 steps with a railing? : Total 6 Click Score: 11    End of Session Equipment Utilized During Treatment: Gait belt Activity Tolerance: Patient limited by pain;Patient limited by lethargy Patient left: in bed;with call bell/phone within reach;with nursing/sitter in room Nurse Communication: Mobility status PT Visit Diagnosis: Muscle weakness (generalized) (M62.81);History of falling (Z91.81);Pain;Difficulty in walking, not elsewhere classified (R26.2);Repeated falls (R29.6) Pain - Right/Left: Right Pain - part of body: Hip     Time: 1205-1228 PT Time Calculation (min) (ACUTE ONLY): 23 min  Charges:  $Gait Training: 8-22 mins $Therapeutic Exercise: 8-22 mins                     Erasmo Leventhal , PTA Acute Rehabilitation Services Pager [phone] Office [phone]     Walter [name] Eli Hose 09/12/2019, 6:16 PM

## 2019-09-12 NOTE — Plan of Care (Signed)

## 2019-09-12 NOTE — Progress Notes (Addendum)
  Speech Language Pathology Treatment: Dysphagia  Patient Details Name: Walter [name] MRN: [id number] DOB: 1950-11-08 Today's Date: 09/12/2019 Time: 2423-5361 SLP Time Calculation (min) (ACUTE ONLY): 25 min  Assessment / Plan / Recommendation Clinical Impression  Pt was seen for f/u while eating breakfast meal. He needed only Min cues today to utilize swallowing strategies such as slowing his rate and taking small bites/sips. No overt s/s of aspiration are observed today, and his family present shares that he used these strategies yesterday during meals with no overt difficulties with swallowing. Recommend continuing mechanical soft solids and thin liquids (no straws). Communication board was also provided to pt and reviewed with him/family. Of note, pt's family also had several questions about follow up therapy, hoping to see if CIR could be an option. Information was relayed to PT/OT team as they have recommended SNF. RN also made aware.    HPI HPI: Pt is a 69 yo male presenting with hip fx s/p R THA 6/7. CT Head performed 6/8 given change in mentation but negative for acute changes. Per dietician note, wife shares that SLP evaluation post-CVA recommended regular solids and thin liquids but that he has been getting strangled at home over the last few months on solids and liquids. PMH: CVA (multiple with most recent one a few years ago, with residual dysarthria and dysphagia), MI, CAD, COPD      SLP Plan  Continue with current plan of care       Recommendations  Diet recommendations: Dysphagia 3 (mechanical soft);Thin liquid Liquids provided via: Cup;No straw Medication Administration: Whole meds with puree Supervision: Patient able to self feed;Intermittent supervision to cue for compensatory strategies Compensations: Minimize environmental distractions;Slow rate;Small sips/bites;Other (Comment) (clear mouth between bites/sips) Postural Changes and/or Swallow Maneuvers: Seated upright 90  degrees                Oral Care Recommendations: Oral care BID Follow up Recommendations: 24 hour supervision/assistance SLP Visit Diagnosis: Dysphagia, unspecified (R13.10) Plan: Continue with current plan of care       GO                 Osie Bond., M.A. [place] Acute Rehabilitation Services Pager [phone] Office [phone]  09/12/2019, 11:30 AM

## 2019-09-13 LAB — SARS CORONAVIRUS 2 (TAT 6-24 HRS): SARS Coronavirus 2: NEGATIVE

## 2019-09-13 MED ORDER — HYDROCODONE-ACETAMINOPHEN 7.5-325 MG PO TABS: 1.0000 | ORAL_TABLET | Freq: Four times a day (QID) | ORAL | Status: DC | PRN

## 2019-09-13 MED ORDER — ASPIRIN 81 MG PO CHEW
81.0000 mg | CHEWABLE_TABLET | Freq: Two times a day (BID) | ORAL | Status: DC
Start: 2019-09-14 — End: 2019-09-13

## 2019-09-13 NOTE — Progress Notes (Addendum)
Physical Therapy Treatment Patient Details Name: Walter [name] MRN: [id number] DOB: 1950/05/10 Today's Date: 09/13/2019    History of Present Illness Walter [name] is a 69 y.o. male with medical history significant of HTN; HLD; CVA (2013); CAD; and COPD presenting as a transfer from [facility] with a hip fracture.  He  [place] down and landed on his hip. Underwent R THA on 6/7.    PT Comments    Pt supine in bed.  He reports he is going home and nurse confirms this.  He continues to require mod +1/+2 for functional mobility.  He presents with LOB and remains appropriate for short term rehab before returning home. Will f/u per POC.  Focus next session on family training and stair training.  Will inform supervising PT to update equipment recommendations.      Follow Up Recommendations  SNF;Supervision/Assistance - 24 hour (If he refuses he will require HHPT/OT and aide.)     Equipment Recommendations  Rolling walker with 5" wheels;3in1 (PT);Hospital bed;Wheelchair (measurements PT);Wheelchair cushion (measurements PT) (PTAR for transfer home as patient has 1 Step to enter home and would have great difficulty getting into a car.)    Recommendations for Other Services OT consult;Speech consult     Precautions / Restrictions Precautions Precautions: Posterior Hip;Fall Precaution Comments: He remains unable to recall posterior hips precautions and required cues to maintain. Restrictions Weight Bearing Restrictions: Yes LLE Weight Bearing: Weight bearing as tolerated    Mobility  Bed Mobility Overal bed mobility: Needs Assistance Bed Mobility: Supine to Sit;Sit to Supine     Supine to sit: Mod assist;+2 for physical assistance     General bed mobility comments: Pt required assistance for LE advancement and trunk elevation to edge of bed.  He continues to present with posterior lean sitting edge of bed.  Transfers Overall transfer level: Needs assistance Equipment used: Rolling walker (2  wheeled) Transfers: Sit to/from Stand Sit to Stand: Mod assist;From elevated surface         General transfer comment: Heavy mod assistance to boost into standing.  Pt presents with posterior bias.  Pt required assistance to translate weight forward to achieve and maintain standing.  Ambulation/Gait Ambulation/Gait assistance: Mod assist;+2 safety/equipment Gait Distance (Feet): 45 Feet Assistive device: Rolling walker (2 wheeled) Gait Pattern/deviations: Step-to pattern;Shuffle;Trunk flexed;Antalgic;Decreased stride length;Leaning posteriorly     General Gait Details: Pt with flexed posture and flexed knee.  LLE turns inward cues to correct to avoid IR.  Pt with increased gt distance.  Balance improves with dynamic movement.  When pt stops to rest posterior bias remains.   Stairs             Wheelchair Mobility    Modified Rankin (Stroke Patients Only)       Balance Overall balance assessment: Needs assistance;History of Falls   Sitting balance-Leahy Scale: Poor Sitting balance - Comments: min assist at times for posterior lean     Standing balance-Leahy Scale: Poor Standing balance comment: reliant on B UEs and external assistance                            Cognition Arousal/Alertness: Awake/alert Behavior During Therapy: Flat affect Overall Cognitive Status: History of cognitive impairments - at baseline Area of Impairment: Following commands;Problem solving;Memory                     Memory: Decreased short-term memory;Decreased recall of precautions Following Commands:  Follows one step commands inconsistently;Follows one step commands with increased time     Problem Solving: Slow processing;Decreased initiation;Difficulty sequencing;Requires verbal cues;Requires tactile cues General Comments: minimal verbalization, but cooperative      Exercises Total Joint Exercises Ankle Circles/Pumps: AROM;Both;10 reps;Supine Quad Sets:  AROM;Left;10 reps;Supine Short Arc Quad: AROM;Left;10 reps;Supine Heel Slides: AAROM;Left;10 reps;Supine Hip ABduction/ADduction: AROM;Left;10 reps;Supine    General Comments        Pertinent Vitals/Pain Pain Assessment: Faces Faces Pain Scale: No hurt Pain Location: L hip Pain Descriptors / Indicators: Operative site guarding;Sore Pain Intervention(s): Monitored during session;Repositioned    Home Living                      Prior Function            PT Goals (current goals can now be found in the care plan section) Acute Rehab PT Goals Patient Stated Goal: To go home Potential to Achieve Goals: Fair Progress towards PT goals: Progressing toward goals    Frequency    Min 5X/week      PT Plan Discharge plan needs to be updated    Co-evaluation              AM-PAC PT "6 Clicks" Mobility   Outcome Measure  Help needed turning from your back to your side while in a flat bed without using bedrails?: A Lot Help needed moving from lying on your back to sitting on the side of a flat bed without using bedrails?: A Lot Help needed moving to and from a bed to a chair (including a wheelchair)?: A Lot Help needed standing up from a chair using your arms (e.g., wheelchair or bedside chair)?: A Lot Help needed to walk in hospital room?: A Lot Help needed climbing 3-5 steps with a railing? : A Lot 6 Click Score: 12    End of Session Equipment Utilized During Treatment: Gait belt Activity Tolerance: Patient limited by pain;Patient limited by lethargy Patient left: in chair;with call bell/phone within reach;with chair alarm set Nurse Communication: Mobility status PT Visit Diagnosis: Muscle weakness (generalized) (M62.81);History of falling (Z91.81);Pain;Difficulty in walking, not elsewhere classified (R26.2);Repeated falls (R29.6) Pain - Right/Left: Right Pain - part of body: Hip     Time: 3295-1884 PT Time Calculation (min) (ACUTE ONLY): 27  min  Charges:  $Gait Training: 8-22 mins $Therapeutic Exercise: 8-22 mins                     Erasmo Leventhal , PTA Acute Rehabilitation Services Pager [phone] Office [phone]     Nallely Yost Eli Hose 09/13/2019, 5:21 PM

## 2019-09-13 NOTE — Plan of Care (Signed)

## 2019-09-13 NOTE — Progress Notes (Signed)
PROGRESS NOTE    Walter [name]  GNF:[id number] DOB: Dec 11, 1950 DOA: 09/07/2019 PCP: Dettinger, Fransisca Kaufmann, MD  Brief Narrative: Walter [name] an 69 y.o.malewith medical history significant ofHTN; HLD; CVA(2013); CAD; and COPD presenting as a transfer from [facility] with a hip fracture.He also has persistent dysarthria and intermittent dysphagia since his stroke. He is "stubborn" and refused to go to rehab following the CVA and appears to have had progressive deficits over time. [facility] course complicated by delirium, urinary retention and hematuria  Assessment & Plan:  Hip fracture -Mechanical fall resulting in hip fracture -Patient is status post operative repair on 6/7 -Patient is on Plavix at baseline this was resumed postop, also started on aspirin 81 mg twice daily by orthopedics for DVT prophylaxis, continue this for 4 weeks -Caution with pain meds, given risk of delirium and confusion -PT OT recommended SNF for rehab but patient is significantly limited with his dysarthria and cognitive deficits from his stroke, family now considering home health  Recurrent urinary retention History of BPH -Significant urinary retention on multiple occasions in the last 2 days, eventually had a Foley catheter placed last night on 6/10 -Needs urology follow-up and voiding trial, resumed Flomax -Today with hematuria, unfortunately already got a.m. dose of Plavix and aspirin, hold subsequent doses -Continue Foley catheter discharge  H/o CVA -Patient with remote h/o CVA but persistent dysarthria, dysphagia, and weakness resulting in frequent falls -PT OT and SLP evaluation completed, SNF recommended for rehab and dysphagia 3, mechanical soft diet with thin liquids recommended -Hold Plavix today given hematuria  Cognitive/memory deficits Dysarthria Pseudobulbar affect -Slowly  progressive, following his stroke -Suspect he may have vascular dementia, CT head notes chronic ischemic changes and  atrophy -He is at risk of delirium, caution with polypharmacy  HTN -Continue Norvasc  HLD -Continue Lipitor  Hypokalemia -repleted  Depression -Continue Wellbutrin  COPD with ongoing tobacco dependence -No home O2 -Stable -Tobacco Dependence: encouraged cessation.      DVT prophylaxis: SCDs, hold aspirin given hematuria Code Status: DNR Family Communication: Wife and sister at bedside Disposition Plan: To be determined Status is: Inpatient  Remains inpatient appropriate because:Ongoing diagnostic testing needed not appropriate for outpatient work up, developed recurrent urinary retention requiring in and out cath followed by Foley catheter placement last night, now with moderate hematuria   Dispo: The patient is from: Home              Anticipated d/c is to: Wife and sister to decide today regarding home versus SNF              Anticipated d/c date is: Tomorrow if hematuria clears              Patient currently not stable for discharge home today    Procedures: PROCEDURE:lefthip hemiarthroplasty utilizing DePuy component, size 9 HiActisstem with a 54 mmunipolar ball with a +5adapter.  SURGEON: Pietro Cassis. Alvan Dame, MD Consultations:  Orthopedics Dr. Alvan Dame Antimicrobials:    Subjective: -Crying today, had to have Foley catheter placed last night due to recurrent retention, now with hematuria  Objective: Vitals:   09/12/19 1500 09/12/19 1946 09/13/19 0342 09/13/19 1025  BP: 138/70 136/64 132/68 129/68  Pulse: 98 97 91 87  Resp: 16 16 16 17   Temp: 98.2 F (36.8 C) 98.7 F (37.1 C) 98.2 F (36.8 C) 98.6 F (37 C)  TempSrc: Oral Oral Oral Oral  SpO2: 95% 95% 96% 97%  Weight:      Height:  Intake/Output Summary (Last 24 hours) at 09/13/2019 1214 Last data filed at 09/13/2019 1200 Gross per 24 hour  Intake 0 ml  Output 1650 ml  Net -1650 ml   Filed Weights   09/07/19 1615  Weight: 80 kg    Examination:  General exam:  Chronically ill elderly male, tearful anxious laying in bed awake alert oriented to self only, dysarthric CVS: S1-S2, regular rate rhythm Lungs: Clear bilaterally Abdomen: Soft, nontender, bowel sounds present Extremities: No edema Skin: Incision healing well Psychiatry: Poor insight and judgment   Data Reviewed:   CBC: Recent Labs  Lab 09/08/19 0304 09/09/19 0343 09/10/19 0428 09/11/19 0317  WBC 12.6* 14.2* 13.0* 14.8*  HGB 16.0 15.7 15.6 13.4  HCT 48.9 48.2 47.7 40.5  MCV 91.7 91.3 92.1 91.0  PLT 271 282 253 664   Basic Metabolic Panel: Recent Labs  Lab 09/08/19 0304 09/09/19 0343 09/10/19 0428 09/11/19 0317  NA 137 138 139 136  K 4.0 3.6 3.3* 4.0  CL 104 104 99 110  CO2 19* 22 22 19*  GLUCOSE 111* 108* 124* 122*  BUN 18 18 19 19   CREATININE 1.00 0.95 1.09 0.72  CALCIUM 8.9 8.9 8.2* 8.2*   GFR: Estimated Creatinine Clearance: 95.7 mL/min (by C-G formula based on SCr of 0.72 mg/dL). Liver Function Tests: No results for input(s): AST, ALT, ALKPHOS, BILITOT, PROT, ALBUMIN in the last 168 hours. No results for input(s): LIPASE, AMYLASE in the last 168 hours. No results for input(s): AMMONIA in the last 168 hours. Coagulation Profile: No results for input(s): INR, PROTIME in the last 168 hours. Cardiac Enzymes: No results for input(s): CKTOTAL, CKMB, CKMBINDEX, TROPONINI in the last 168 hours. BNP (last 3 results) No results for input(s): PROBNP in the last 8760 hours. HbA1C: No results for input(s): HGBA1C in the last 72 hours. CBG: Recent Labs  Lab 09/10/19 1728  GLUCAP 142*   Lipid Profile: No results for input(s): CHOL, HDL, LDLCALC, TRIG, CHOLHDL, LDLDIRECT in the last 72 hours. Thyroid Function Tests: No results for input(s): TSH, T4TOTAL, FREET4, T3FREE, THYROIDAB in the last 72 hours. Anemia Panel: No results for input(s): VITAMINB12, FOLATE, FERRITIN, TIBC, IRON, RETICCTPCT in the last 72 hours. Urine analysis:    Component Value Date/Time    COLORURINE YELLOW 09/10/2019 0602   APPEARANCEUR HAZY (A) 09/10/2019 0602   APPEARANCEUR Clear 12/28/2018 1110   LABSPEC 1.030 09/10/2019 0602   PHURINE 5.0 09/10/2019 0602   GLUCOSEU NEGATIVE 09/10/2019 0602   HGBUR LARGE (A) 09/10/2019 0602   BILIRUBINUR NEGATIVE 09/10/2019 0602   BILIRUBINUR Negative 12/28/2018 1110   KETONESUR 20 (A) 09/10/2019 0602   PROTEINUR 100 (A) 09/10/2019 0602   UROBILINOGEN 0.2 02/12/2014 1445   NITRITE NEGATIVE 09/10/2019 0602   LEUKOCYTESUR NEGATIVE 09/10/2019 0602   Sepsis Labs: @LABRCNTIP (procalcitonin:4,lacticidven:4)  ) Recent Results (from the past 240 hour(s))  Surgical PCR screen     Status: Abnormal   Collection Time: 09/07/19  4:22 PM   Specimen: Nasal Mucosa; Nasal Swab  Result Value Ref Range Status   MRSA, PCR NEGATIVE NEGATIVE Final   Staphylococcus aureus POSITIVE (A) NEGATIVE Final    Comment: CRITICAL RESULT CALLED TO, READ BACK BY AND VERIFIED WITH: RN, PAM R. @0521  [id number] THANEY Performed at [facility], [place] [address] [place], [place] [address]   SARS CORONAVIRUS 2 (TAT 6-24 HRS) Nasopharyngeal Nasopharyngeal Swab     Status: None   Collection Time: 09/09/19  9:37 AM   Specimen: Nasopharyngeal Swab  Result  Value Ref Range Status   SARS Coronavirus 2 NEGATIVE NEGATIVE Final    Comment: (NOTE) SARS-CoV-2 target nucleic acids are NOT DETECTED. The SARS-CoV-2 RNA is generally detectable in upper and lower respiratory specimens during the acute phase of infection. Negative results do not preclude SARS-CoV-2 infection, do not rule out co-infections with other pathogens, and should not be used as the sole basis for treatment or other patient management decisions. Negative results must be combined with clinical observations, patient history, and epidemiological information. The expected result is Negative. Fact Sheet for Patients: SugarRoll.be Fact Sheet for Healthcare  Providers: https://www.woods-mathews.com/ This test is not yet approved or cleared by the [place] FDA and  has been authorized for detection and/or diagnosis of SARS-CoV-2 by FDA under an Emergency Use Authorization (EUA). This EUA will remain  in effect (meaning this test can be used) for the duration of the COVID-19 declaration under Section 56 4(b)(1) of the Act, 21 U.S.C. section 360bbb-3(b)(1), unless the authorization is terminated or revoked sooner. Performed at [facility], [place] [address] [place], [place] [address]   SARS CORONAVIRUS 2 (TAT 6-24 HRS) Nasopharyngeal Nasopharyngeal Swab     Status: None   Collection Time: 09/13/19  5:10 AM   Specimen: Nasopharyngeal Swab  Result Value Ref Range Status   SARS Coronavirus 2 NEGATIVE NEGATIVE Final    Comment: (NOTE) SARS-CoV-2 target nucleic acids are NOT DETECTED.  The SARS-CoV-2 RNA is generally detectable in upper and lower respiratory specimens during the acute phase of infection. Negative results do not preclude SARS-CoV-2 infection, do not rule out co-infections with other pathogens, and should not be used as the sole basis for treatment or other patient management decisions. Negative results must be combined with clinical observations, patient history, and epidemiological information. The expected result is Negative.  Fact Sheet for Patients: SugarRoll.be  Fact Sheet for Healthcare Providers: https://www.woods-mathews.com/  This test is not yet approved or cleared by the [place] FDA and  has been authorized for detection and/or diagnosis of SARS-CoV-2 by FDA under an Emergency Use Authorization (EUA). This EUA will remain  in effect (meaning this test can be used) for the duration of the COVID-19 declaration under Se ction 564(b)(1) of the Act, 21 U.S.C. section 360bbb-3(b)(1), unless the authorization is terminated or revoked  sooner.  Performed at [facility], [place] [address] [place], [place] [address]          Radiology Studies: No results found.      Scheduled Meds: . amLODipine  5 mg Oral Daily  . aspirin  81 mg Oral BID  . atorvastatin  40 mg Oral Daily  . buPROPion  300 mg Oral Daily  . Chlorhexidine Gluconate Cloth  6 each Topical Daily  . clopidogrel  75 mg Oral Daily  . docusate sodium  100 mg Oral BID  . feeding supplement (ENSURE ENLIVE)  237 mL Oral BID BM  . ferrous sulfate  325 mg Oral TID PC  . melatonin  6 mg Oral QHS  . multivitamin with minerals  1 tablet Oral Daily  . nicotine  14 mg Transdermal Daily  . polyethylene glycol  17 g Oral BID  . tamsulosin  0.4 mg Oral QPC breakfast   Continuous Infusions: . lactated ringers 10 mL/hr at 09/09/19 1742  . methocarbamol (ROBAXIN) IV       LOS: 6 days    Time spent: 36min  Domenic Polite, MD [facility]   09/13/2019, 12:14 PM

## 2019-09-13 NOTE — Progress Notes (Cosign Needed)
    Durable Medical Equipment  (From admission, onward)         Start     Ordered   09/13/19 1353  For home use only DME Hospital bed  Once       Question Answer Comment  Length of Need 12 Months   The above medical condition requires: Patient requires the ability to reposition frequently   Head must be elevated greater than: 30 degrees   Bed type Semi-electric   Hoyer Lift Yes   Support Surface: Gel Overlay      09/13/19 1354   09/13/19 1353  For home use only DME 3 n 1  Once        09/13/19 1354   09/13/19 1353  For home use only DME Walker rolling  Once       Question Answer Comment  Walker: With [place]   Patient needs a walker to treat with the following condition Weakness      09/13/19 1354   09/13/19 1350  For home use only DME lightweight manual wheelchair with seat cushion  Once       Comments: Patient suffers from L hip fx repair which impairs their ability to perform daily activities like walk ingin the home.  A  cane will not resolve  issue with performing activities of daily living. A wheelchair will allow patient to safely perform daily activities. Patient is not able to propel themselves in the home using a standard weight wheelchair due to weakness. Patient can self propel in the lightweight wheelchair. Length of need 12 months. Accessories: elevating leg rests (ELRs), wheel locks, extensions and anti-tippers.   09/13/19 1354

## 2019-09-13 NOTE — Progress Notes (Signed)
Noted bloody urinary output, MD notified, received order to rriigate foley once. Foley irrigated with 40cc normal saline, no resistance, no clots noted.

## 2019-09-13 NOTE — TOC Progression Note (Signed)
Transition of Care [facility]) - Progression Note    Patient Details  Name: Walter [name] MRN: [id number] Date of Birth: 02-13-1951  Transition of [facility]) CM/SW Contact  Sharin Mons, RN Phone Number: [phone] 09/13/2019, 2:01 PM  Clinical Narrative:    Pt's wife has changed her mind regarding SNF placement and would like for pt tp d/c to home when medically ready. Pt with [place] orders. Pt/wife agreeable to [facility] services. Face to face  will be needed for RN,PT,OT,NA,SW from MD.  Choice provided however pt/wife without preference. Referral made with [facility] and accepted. Pt with DME needs, rolling walker, W/C, hospital bed, 3in1/bsc. Referral made with [facility]. Equipment will be deliver to home prior to d/c   Pt will need PTAR transportation to home.  TOC  Team will continue monitor for needs ...  Expected Discharge Plan: [place] Barriers to Discharge: Continued Medical Work up  Expected Discharge Plan and Services Expected Discharge Plan: [place]   Discharge Planning Services: CM Consult Post Acute Care Choice: Home Health, Durable Medical Equipment                   DME Arranged: 3-N-1, Youth worker wheelchair with seat cushion, Walker rolling, Hospital bed DME Agency: [facility] Date DME Agency Contacted: 09/13/19 Time DME Agency Contacted: 71 Representative spoke with at DME Agency: [place]: RN, PT, OT, Nurse's Aide, Social Work [place]: [facility] [employer] [facility] [employer] Date [place]: 09/13/19 Time [place]: 83 Representative spoke with at [place]: [place] ([place]) Interventions    Readmission Risk Interventions No flowsheet data found.

## 2019-09-14 LAB — CBC
HCT: 40.8 % (ref 39.0–52.0)
Hemoglobin: 13.4 g/dL (ref 13.0–17.0)
MCH: 30 pg (ref 26.0–34.0)
MCHC: 32.8 g/dL (ref 30.0–36.0)
MCV: 91.5 fL (ref 80.0–100.0)
Platelets: 288 10*3/uL (ref 150–400)
RBC: 4.46 MIL/uL (ref 4.22–5.81)
RDW: 12.7 % (ref 11.5–15.5)
WBC: 11.7 10*3/uL — ABNORMAL HIGH (ref 4.0–10.5)
nRBC: 0 % (ref 0.0–0.2)

## 2019-09-14 LAB — BASIC METABOLIC PANEL
Anion gap: 9 (ref 5–15)
BUN: 16 mg/dL (ref 8–23)
CO2: 24 mmol/L (ref 22–32)
Calcium: 8.4 mg/dL — ABNORMAL LOW (ref 8.9–10.3)
Chloride: 106 mmol/L (ref 98–111)
Creatinine, Ser: 0.87 mg/dL (ref 0.61–1.24)
GFR calc Af Amer: >60 mL/min (ref >60)
GFR calc non Af Amer: >60 mL/min (ref >60)
Glucose, Bld: 113 mg/dL — ABNORMAL HIGH (ref 70–99)
Potassium: 3.7 mmol/L (ref 3.5–5.1)
Sodium: 139 mmol/L (ref 135–145)

## 2019-09-14 MED ORDER — TAMSULOSIN HCL 0.4 MG PO CAPS: 0.4000 mg | ORAL_CAPSULE | Freq: Every day | ORAL | 0 refills | Status: DC

## 2019-09-14 MED ORDER — CLOPIDOGREL BISULFATE 75 MG PO TABS: 75.0000 mg | ORAL_TABLET | Freq: Every day | ORAL | Status: DC

## 2019-09-14 MED ORDER — ASPIRIN 81 MG PO CHEW: 81.0000 mg | CHEWABLE_TABLET | Freq: Two times a day (BID) | ORAL | 0 refills | Status: AC

## 2019-09-14 NOTE — Progress Notes (Signed)
Occupational Therapy Treatment Patient Details Name: Walter [name] MRN: [id number] DOB: Aug 27, 1950 Today's Date: 09/14/2019    History of present illness PILOT PRINDLE is a 69 y.o. male with medical history significant of HTN; HLD; CVA (2013); CAD; and COPD presenting as a transfer from [facility] with a hip fracture.  He  [address] down and landed on his hip. Underwent R THA on 6/7.   OT comments  Pt presents in bed, awake with sister at bedside, agreeable to session to address safety with functional bsc transfer and caregiver education. Pt required Mod A to sit EOB to manage BLE and to elevate trunk to sitting. In sitting pt observed to have posterior lean with sudden jerk. Pt appeared fearful but once steady, pt was fine. Pt required heavy mod to max A to power to stand and heavy cueing for sequencing/hand placement for stand pivot transfer to [facility] as well as returning back to bed. Caregiver educated safe transfers, positioning and use of safety belt. Pt demonstrates weakness and is unsteady. If DC to home enviroment, max support for safety awareness and to prevent falls when performing ADLs.    Follow Up Recommendations  SNF;Supervision/Assistance - 24 hour    Equipment Recommendations  3 in 1 bedside commode    Recommendations for Other Services      Precautions / Restrictions Precautions Precautions: Posterior Hip;Fall Precaution Booklet Issued: Yes (comment) Precaution Comments: He remains unable to recall posterior hips precautions and required cues to maintain. Restrictions Weight Bearing Restrictions: Yes LLE Weight Bearing: Weight bearing as tolerated       Mobility Bed Mobility Overal bed mobility: Needs Assistance Bed Mobility: Supine to Sit;Sit to Supine     Supine to sit: Max assist Sit to supine: Max assist   General bed mobility comments: Pt required assistance for LE advancement and trunk elevation to edge of bed.  He continues to present with posterior lean sitting edge of  bed.  Transfers Overall transfer level: Needs assistance Equipment used: Rolling walker (2 wheeled) Transfers: Sit to/from Stand Sit to Stand: Mod assist;From elevated surface Stand pivot transfers: Mod assist            Balance Overall balance assessment: Needs assistance;History of Falls Sitting-balance support: Feet supported;Bilateral upper extremity supported Sitting balance-Leahy Scale: Poor Sitting balance - Comments: min assist at times for posterior lean Postural control: Right lateral lean   Standing balance-Leahy Scale: Poor Standing balance comment: reliant on B UEs and external assistance                           ADL either performed or assessed with clinical judgement   ADL Overall ADL's : Needs assistance/impaired                         Toilet Transfer: RW;BSC;Moderate assistance;Stand-pivot Toilet Transfer Details (indicate cue type and reason): Pt very unsteady with stand pivot transfer to [facility]. Sister at bedside given caregiver education for safety and sequencing with functional transfers.          Functional mobility during ADLs: Cueing for sequencing;Cueing for safety;Rolling walker;Moderate assistance General ADL Comments: Pt very unsteady, required heavy cueing for safety with sequencing and hand placement for bsc transfer.     Vision       Perception     Praxis      Cognition Arousal/Alertness: Awake/alert Behavior During Therapy: Flat affect Overall Cognitive Status: History of cognitive  impairments - at baseline Area of Impairment: Following commands;Problem solving;Memory                     Memory: Decreased short-term memory;Decreased recall of precautions Following Commands: Follows one step commands inconsistently;Follows one step commands with increased time     Problem Solving: Slow processing;Decreased initiation;Difficulty sequencing;Requires verbal cues;Requires tactile cues General Comments:  minimal verbalization, but cooperative        Exercises     Shoulder Instructions       General Comments pt very shaky and weak. Sister reports pt did not sleep well or eat breakfast and dinner.     Pertinent Vitals/ Pain       Pain Assessment: Faces Faces Pain Scale: Hurts a little bit Pain Location: L hip Pain Descriptors / Indicators: Operative site guarding;Sore Pain Intervention(s): Limited activity within patient's tolerance;Repositioned;Monitored during session  Home Living                                          Prior Functioning/Environment              Frequency  Min 2X/week        Progress Toward Goals  OT Goals(current goals can now be found in the care plan section)  Progress towards OT goals: Progressing toward goals (slowly progressing)  Acute Rehab OT Goals Patient Stated Goal: To go home OT Goal Formulation: With patient Time For Goal Achievement: 09/25/19 Potential to Achieve Goals: Good ADL Goals Pt Will Perform Upper Body Dressing: sitting;with set-up Pt Will Transfer to Toilet: with min guard assist;ambulating;bedside commode (over toilet) Pt Will Perform Toileting - Clothing Manipulation and hygiene: with min assist;sit to/from stand Additional ADL Goal #1: Pt will perform bed mobility with min assist in preparation for ADL. Additional ADL Goal #2: Pt will sit EOB while engaged in ADL without LOB. Additional ADL Goal #3: Family will be knowledgeable in posterior hip precautions and level of assist pt needs for ADL.  Plan Discharge plan remains appropriate    Co-evaluation                 AM-PAC OT "6 Clicks" Daily Activity     Outcome Measure   Help from another person eating meals?: A Little Help from another person taking care of personal grooming?: A Little Help from another person toileting, which includes using toliet, bedpan, or urinal?: Total Help from another person bathing (including washing,  rinsing, drying)?: A Lot Help from another person to put on and taking off regular upper body clothing?: A Little Help from another person to put on and taking off regular lower body clothing?: Total 6 Click Score: 13    End of Session Equipment Utilized During Treatment: Gait belt;Rolling walker  OT Visit Diagnosis: Unsteadiness on feet (R26.81);Other abnormalities of gait and mobility (R26.89);Pain;Muscle weakness (generalized) (M62.81);Other symptoms and signs involving cognitive function   Activity Tolerance     Patient Left with call bell/phone within reach;with family/visitor present;in bed;with bed alarm set   Nurse Communication Mobility status        Time: 9326-7124 OT Time Calculation (min): 34 min  Charges: OT General Charges $OT Visit: 1 Visit OT Treatments $Self Care/Home Management : 23-37 mins  Minus Breeding, MSOT, OTR/L  Supplemental Rehabilitation Services  [phone]    Marius Ditch 09/14/2019, 11:29 AM

## 2019-09-14 NOTE — Plan of Care (Signed)

## 2019-09-14 NOTE — Progress Notes (Signed)
Physical Therapy Treatment Note  Pt seen for mobility progression. Pt continues to require mod-mod A +2 for safety with all mobility. Pt with posterior bias in standing and while ambulating. Wife present during session and educated on use of gait belt and assisted pt with ambulating (with assist of therapist). Pt tolerated gait distance of 40 ft before fatigued. Pt with increased WOB while ambulating. PT will continue to follow acutely and progress as tolerated.    09/14/19 1100  PT Visit Information  Last PT Received On 09/14/19  Assistance Needed +2 (for close chair follow)  History of Present Illness ADISA LITT is a 69 y.o. male with medical history significant of HTN; HLD; CVA (2013); CAD; and COPD presenting as a transfer from [facility] with a hip fracture.  He  [address] down and landed on his hip. Underwent R THA on 6/7.  Precautions  Precautions Posterior Hip;Fall  Precaution Comments He remains unable to recall posterior hips precautions and required cues to maintain.  Restrictions  Weight Bearing Restrictions Yes  LLE Weight Bearing WBAT  Pain Assessment  Pain Assessment Faces  Faces Pain Scale 2  Pain Location L hip  Pain Descriptors / Indicators Operative site guarding;Sore  Pain Intervention(s) Limited activity within patient's tolerance;Repositioned;Monitored during session  Cognition  Arousal/Alertness Awake/alert  Behavior During Therapy Flat affect  Overall Cognitive Status History of cognitive impairments - at baseline  Area of Impairment Following commands;Problem solving;Memory  Memory Decreased short-term memory;Decreased recall of precautions  Following Commands Follows one step commands inconsistently;Follows one step commands with increased time  Problem Solving Slow processing;Decreased initiation;Difficulty sequencing;Requires verbal cues;Requires tactile cues  General Comments minimal verbalization, but cooperative  Difficult to assess due to Impaired  communication;Hard of hearing/deaf (expressive aphasia at baseline.)  Bed Mobility  Overal bed mobility Needs Assistance  Bed Mobility Supine to Sit  Supine to sit Mod assist  General bed mobility comments cues for sequencing; use of rail; assist to bring L LE/hips to EOB and to elevate trunk into sitting   Transfers  Overall transfer level Needs assistance  Equipment used Rolling walker (2 wheeled)  Transfers Sit to/from Stand  Sit to Stand Mod assist;From elevated surface  General transfer comment assist to power up into standing and for balance upon standing as pt has posterior bias  Ambulation/Gait  Ambulation/Gait assistance Mod assist;+2 safety/equipment;Min assist  Gait Distance (Feet) 40 Feet  Assistive device Rolling walker (2 wheeled)  Gait Pattern/deviations Step-to pattern;Step-through pattern;Decreased step length - right;Decreased step length - left  General Gait Details cues for increased bilat sep lengths, anterior weight shift, and safe use of AD; assistance required for balance and at times to manage RW; wife educated in use of gait belt and assisting pt (with therapist assist) to ambulate   Gait velocity decreased  Balance  Overall balance assessment Needs assistance;History of Falls  Sitting-balance support Feet supported;Bilateral upper extremity supported  Sitting balance-Leahy Scale Poor  Postural control Posterior lean  Standing balance support Bilateral upper extremity supported  Standing balance-Leahy Scale Poor  PT - End of Session  Equipment Utilized During Treatment Gait belt  Activity Tolerance Patient tolerated treatment well  Patient left in chair;with call bell/phone within reach;with chair alarm set;with family/visitor present  Nurse Communication Mobility status   PT - Assessment/Plan  PT Plan Current plan remains appropriate  PT Visit Diagnosis Muscle weakness (generalized) (M62.81);History of falling (Z91.81);Pain;Difficulty in walking, not  elsewhere classified (R26.2);Repeated falls (R29.6)  Pain - Right/Left Right  Pain -  part of body Hip  PT Frequency (ACUTE ONLY) Min 5X/week  Follow Up Recommendations SNF;Supervision/Assistance - 24 hour  PT equipment Rolling walker with 5" wheels;3in1 (PT);Hospital bed;Wheelchair (measurements PT);Wheelchair cushion (measurements PT)  AM-PAC PT "6 Clicks" Mobility Outcome Measure (Version 2)  Help needed turning from your back to your side while in a flat bed without using bedrails? 2  Help needed moving from lying on your back to sitting on the side of a flat bed without using bedrails? 2  Help needed moving to and from a bed to a chair (including a wheelchair)? 2  Help needed standing up from a chair using your arms (e.g., wheelchair or bedside chair)? 2  Help needed to walk in hospital room? 2  Help needed climbing 3-5 steps with a railing?  2  6 Click Score 12  Consider Recommendation of Discharge To: CIR/SNF/LTACH  PT Goal Progression  Progress towards PT goals Progressing toward goals  PT Time Calculation  PT Start Time (ACUTE ONLY) 1127  PT Stop Time (ACUTE ONLY) 1152  PT Time Calculation (min) (ACUTE ONLY) 25 min  PT General Charges  $$ ACUTE PT VISIT 1 Visit  PT Treatments  $Gait Training 23-37 mins   Earney Navy, PTA Acute Rehabilitation Services Pager: [phone] Office: [phone]

## 2019-09-14 NOTE — Discharge Summary (Signed)
Physician Discharge Summary  MAC DOWDELL MCN:[id number] DOB: 1950-08-10 DOA: 09/07/2019  PCP: Dettinger, Fransisca Kaufmann, MD  Admit date: 09/07/2019 Discharge date: 09/14/2019  Time spent: 35 minutes  Recommendations for Outpatient Follow-up:  1. PCP in 1 week 2. Orthopedic surgery Dr. Alvan Dame in 2 weeks 3. [facility] for voiding trial in 1 week 4. Home health PT OT RN, aide   Discharge Diagnoses:  Principal Problem:   Hip fracture, left, closed, initial encounter ([place])   Cognitive deficits, dysarthria   History of CVA (cerebrovascular accident)   Tobacco abuse   COPD (chronic obstructive pulmonary disease) (HCC)   HTN (hypertension)   Depression, recurrent ([place])   Hyperlipidemia, mixed   DNR (do not resuscitate)   S/P left THA, AA   Recurrent urinary retention   BPH  Discharge Condition: Stable  Diet recommendation: Dysphagia 3 diet  Filed Weights   09/07/19 1615  Weight: 80 kg    History of present illness:  Walter [name] an 69 y.o.malewith medical history significant ofHTN; HLD; CVA(2013); CAD; and COPD presenting as a transfer from [facility] with a hip fracture.He also has persistent dysarthria and intermittent dysphagia since his stroke. He is "stubborn" and refused to go to rehab following the CVA and appears to have had progressive deficits over time. [facility] course complicated by delirium, urinary retention and hematuria   Hospital Course:   Hip fracture -Mechanical fall resulting in hip fracture -Patient is status post operative repair on 6/7 -Patient is on Plavix at baseline this was resumed postop, also started on aspirin 81 mg twice daily by orthopedics for DVT prophylaxis, continue this for 4 weeks -PT OT recommended SNF for rehab but patient is significantly limited with his dysarthria and cognitive deficits from his stroke, family now considering home health services instead -Set up with home health PT OT RN and aide -Follow-up with Dr. Alvan Dame in 2  weeks  Recurrent urinary retention Hematuria History of BPH -Significant urinary retention on multiple occasions in the last 2 days, eventually had a Foley catheter placed last night on 6/10 -Needs urology follow-up and voiding trial, resumed Flomax -He had hematuria following in and out catheterization and Foley catheter placement, this is improving, advised to hold aspirin and Plavix for the next 4 to 5 days -Continue Foley catheter discharge -Needs urology follow-up and voiding trial in 1 week  H/o CVA -Patient with remote h/o CVA but persistent dysarthria, dysphagia, and weakness resulting in frequent falls -PT OT and SLP evaluation completed, SNF recommended for rehab and dysphagia 3, mechanical soft diet with thin liquids recommended -Hold Plavix for few days due to hematuria  Cognitive/memory deficits Dysarthria Pseudobulbar affect -Slowlyprogressive, following his stroke -Suspect he may have vascular dementia, CT head notes chronic ischemic changes and atrophy -He is at risk of delirium, caution with polypharmacy -Numerous crying spells throughout this hospitalization and delirium  HTN -Continue Norvasc  HLD -Continue Lipitor  Hypokalemia -repleted  Depression -Continue Wellbutrin  COPD with ongoing tobacco dependence -No home O2 -Stable -Tobacco Dependence: encouragedcessation.   Code Status: DNR  Procedures: PROCEDURE:lefthip hemiarthroplasty utilizing DePuy component, size 9 HiActisstem with a 54 mmunipolar ball with a +5adapter.  SURGEON: Pietro Cassis. Alvan Dame, MD Consultations:  Orthopedics Dr. Alvan Dame  Discharge Exam: Vitals:   09/14/19 0457 09/14/19 0743  BP: (!) 142/75 132/72  Pulse: 90 89  Resp: 12 17  Temp: 98.6 F (37 C) 98.7 F (37.1 C)  SpO2: 96% 97%    General: Awake alert oriented to self  only, dysarthric, significant cognitive deficits Cardiovascular: S1-S2, regular rhythm Respiratory: Clear  Discharge  Instructions   Discharge Instructions    Diet - low sodium heart healthy   Complete by: As directed    Increase activity slowly   Complete by: As directed    No wound care   Complete by: As directed      Allergies as of 09/14/2019   No Known Allergies     Medication List    TAKE these medications   acetaminophen 325 MG tablet Commonly known as: TYLENOL Take 1-2 tablets (325-650 mg total) by mouth every 6 (six) hours as needed for mild pain (pain score 1-3).   amLODipine 5 MG tablet Commonly known as: NORVASC TAKE 1 TABLET EVERY DAY   aspirin 81 MG chewable tablet Chew 1 tablet (81 mg total) by mouth 2 (two) times daily. Start after 3-4days when blood in urine clears Start taking on: September 17, 2019   atorvastatin 40 MG tablet Commonly known as: LIPITOR Take 1 tablet (40 mg total) by mouth daily.   buPROPion 300 MG 24 hr tablet Commonly known as: WELLBUTRIN XL Take 1 tablet (300 mg total) by mouth daily.   clopidogrel 75 MG tablet Commonly known as: PLAVIX Take 1 tablet (75 mg total) by mouth daily. Restart after 3-4days after blood in urine clears Start taking on: September 17, 2019 What changed:   additional instructions  These instructions start on September 17, 2019. If you are unsure what to do until then, ask your doctor or other care provider.   ferrous sulfate 325 (65 FE) MG tablet Take 1 tablet (325 mg total) by mouth 2 (two) times daily with a meal.   HYDROcodone-acetaminophen 7.5-325 MG tablet Commonly known as: NORCO Take 1 tablet by mouth every 6 (six) hours as needed for severe pain (pain score 7-10).   polyethylene glycol 17 g packet Commonly known as: MIRALAX / GLYCOLAX Take 17 g by mouth daily as needed for mild constipation.   tamsulosin 0.4 MG Caps capsule Commonly known as: FLOMAX Take 1 capsule (0.4 mg total) by mouth daily after breakfast. Start taking on: September 15, 2019            Walter [name]  (From admission, onward)          Start     Ordered   09/13/19 1353  For home use only DME Hospital bed  Once       Question Answer Comment  Length of Need 12 Months   The above medical condition requires: Patient requires the ability to reposition frequently   Head must be elevated greater than: 30 degrees   Bed type Semi-electric   Hoyer Lift Yes   Support Surface: Gel Overlay      09/13/19 1354   09/13/19 1353  For home use only DME 3 n 1  Once        09/13/19 1354   09/13/19 1353  For home use only DME Walker rolling  Once       Question Answer Comment  Walker: With [place]   Patient needs a walker to treat with the following condition Weakness      09/13/19 1354   09/13/19 1350  For home use only DME lightweight manual wheelchair with seat cushion  Once       Comments: Patient suffers from L hip fx repair which impairs their ability to perform daily activities like walk ingin the home.  A  cane  will not resolve  issue with performing activities of daily living. A wheelchair will allow patient to safely perform daily activities. Patient is not able to propel themselves in the home using a standard weight wheelchair due to weakness. Patient can self propel in the lightweight wheelchair. Length of need 12 months. Accessories: elevating leg rests (ELRs), wheel locks, extensions and anti-tippers.   09/13/19 1354         No Known Allergies  Contact information for follow-up providers    Paralee Cancel, MD. Schedule an appointment as soon as possible for a visit in 2 weeks.   Specialty: Orthopedic Surgery Contact information: [address] [place] [address] [phone]        Home, [facility] Follow up.   Specialty: Home Health Services Contact information: [place] [address] [phone]            Contact information for after-discharge care    Destination    [facility] .   Service: Skilled Nursing Contact information: [place] [phone]                   The results of significant diagnostics from this hospitalization (including imaging, microbiology, ancillary and laboratory) are listed below for reference.    Significant Diagnostic Studies: CT HEAD WO CONTRAST  Result Date: 09/10/2019 CLINICAL DATA:  Encephalopathy EXAM: CT HEAD WITHOUT CONTRAST TECHNIQUE: Contiguous axial images were obtained from the base of the skull through the vertex without intravenous contrast. COMPARISON:  12/05/2012 FINDINGS: Brain: There is no mass, hemorrhage or extra-axial collection. There is generalized atrophy without lobar predilection. Hypodensity of the white matter is most commonly associated with chronic microvascular disease. There are old bilateral basal ganglia small vessel infarcts. Vascular: Atherosclerotic calcification of the vertebral and internal carotid arteries at the skull base. No abnormal hyperdensity of the major intracranial arteries or dural venous sinuses. Skull: The visualized skull base, calvarium and extracranial soft tissues are normal. Sinuses/Orbits: No fluid levels or advanced mucosal thickening of the visualized paranasal sinuses. No mastoid or middle ear effusion. The orbits are normal. IMPRESSION: 1. No acute intracranial abnormality. 2. Generalized atrophy and chronic microvascular ischemia. White matter disease has progressed compared to 12/05/2012. 3. Old bilateral basal ganglia small vessel infarcts. Electronically Signed   By: Ulyses Jarred M.D.   On: 09/10/2019 19:45   DG Pelvis Portable  Result Date: 09/09/2019 CLINICAL DATA:  Left hip replacement. EXAM: PORTABLE PELVIS 1-2 VIEWS COMPARISON:  Left hip x-rays from yesterday. FINDINGS: The left hip demonstrates a hemiarthroplasty without evidence of hardware failure or complication. Expected intra-articular air. There is no fracture or dislocation. The alignment is anatomic. Post-surgical changes  noted in the surrounding soft tissues. IMPRESSION: 1. Interval left hip hemiarthroplasty without acute postoperative complication. Electronically Signed   By: Titus Dubin M.D.   On: 09/09/2019 20:06   DG HIP UNILAT WITH PELVIS 2-3 VIEWS LEFT  Result Date: 09/08/2019 CLINICAL DATA:  LEFT hip fracture, fell on 09/06/2019 EXAM: DG HIP (WITH OR WITHOUT PELVIS) 2-3V LEFT COMPARISON:  09/07/2019 FINDINGS: Osseous demineralization. Hip and SI joint spaces preserved. Displaced fracture LEFT femoral neck with slight resorption at fracture line since prior study. No dislocation. Pelvis intact. Nondiagnostic cross-table shoot through views. IMPRESSION: Displaced LEFT femoral neck fracture. Electronically Signed   By: Lavonia Dana M.D.   On: 09/08/2019 13:04    Microbiology: Recent Results (from the past 240  hour(s))  Surgical PCR screen     Status: Abnormal   Collection Time: 09/07/19  4:22 PM   Specimen: Nasal Mucosa; Nasal Swab  Result Value Ref Range Status   MRSA, PCR NEGATIVE NEGATIVE Final   Staphylococcus aureus POSITIVE (A) NEGATIVE Final    Comment: CRITICAL RESULT CALLED TO, READ BACK BY AND VERIFIED WITH: RN, PAM R. @0521  [id number] THANEY Performed at [facility], [place] [address] [place], [place] [address]   SARS CORONAVIRUS 2 (TAT 6-24 HRS) Nasopharyngeal Nasopharyngeal Swab     Status: None   Collection Time: 09/09/19  9:37 AM   Specimen: Nasopharyngeal Swab  Result Value Ref Range Status   SARS Coronavirus 2 NEGATIVE NEGATIVE Final    Comment: (NOTE) SARS-CoV-2 target nucleic acids are NOT DETECTED. The SARS-CoV-2 RNA is generally detectable in upper and lower respiratory specimens during the acute phase of infection. Negative results do not preclude SARS-CoV-2 infection, do not rule out co-infections with other pathogens, and should not be used as the sole basis for treatment or other patient management decisions. Negative results must be combined with clinical  observations, patient history, and epidemiological information. The expected result is Negative. Fact Sheet for Patients: SugarRoll.be Fact Sheet for Healthcare Providers: https://www.woods-mathews.com/ This test is not yet approved or cleared by the [place] FDA and  has been authorized for detection and/or diagnosis of SARS-CoV-2 by FDA under an Emergency Use Authorization (EUA). This EUA will remain  in effect (meaning this test can be used) for the duration of the COVID-19 declaration under Section 56 4(b)(1) of the Act, 21 U.S.C. section 360bbb-3(b)(1), unless the authorization is terminated or revoked sooner. Performed at [facility], [place] [address] [place], [place] [address]   SARS CORONAVIRUS 2 (TAT 6-24 HRS) Nasopharyngeal Nasopharyngeal Swab     Status: None   Collection Time: 09/13/19  5:10 AM   Specimen: Nasopharyngeal Swab  Result Value Ref Range Status   SARS Coronavirus 2 NEGATIVE NEGATIVE Final    Comment: (NOTE) SARS-CoV-2 target nucleic acids are NOT DETECTED.  The SARS-CoV-2 RNA is generally detectable in upper and lower respiratory specimens during the acute phase of infection. Negative results do not preclude SARS-CoV-2 infection, do not rule out co-infections with other pathogens, and should not be used as the sole basis for treatment or other patient management decisions. Negative results must be combined with clinical observations, patient history, and epidemiological information. The expected result is Negative.  Fact Sheet for Patients: SugarRoll.be  Fact Sheet for Healthcare Providers: https://www.woods-mathews.com/  This test is not yet approved or cleared by the [place] FDA and  has been authorized for detection and/or diagnosis of SARS-CoV-2 by FDA under an Emergency Use Authorization (EUA). This EUA will remain  in effect (meaning this test can  be used) for the duration of the COVID-19 declaration under Se ction 564(b)(1) of the Act, 21 U.S.C. section 360bbb-3(b)(1), unless the authorization is terminated or revoked sooner.  Performed at [facility], [place] [address], [place], [place] [address]      Labs: Basic Metabolic Panel: Recent Labs  Lab 09/08/19 0304 09/09/19 0343 09/10/19 0428 09/11/19 0317 09/14/19 0501  NA 137 138 139 136 139  K 4.0 3.6 3.3* 4.0 3.7  CL 104 104 99 110 106  CO2 19* 22 22 19* 24  GLUCOSE 111* 108* 124* 122* 113*  BUN 18 18 19 19 16   CREATININE 1.00 0.95 1.09 0.72 0.87  CALCIUM 8.9 8.9 8.2*  8.2* 8.4*   Liver Function Tests: No results for input(s): AST, ALT, ALKPHOS, BILITOT, PROT, ALBUMIN in the last 168 hours. No results for input(s): LIPASE, AMYLASE in the last 168 hours. No results for input(s): AMMONIA in the last 168 hours. CBC: Recent Labs  Lab 09/08/19 0304 09/09/19 0343 09/10/19 0428 09/11/19 0317 09/14/19 0501  WBC 12.6* 14.2* 13.0* 14.8* 11.7*  HGB 16.0 15.7 15.6 13.4 13.4  HCT 48.9 48.2 47.7 40.5 40.8  MCV 91.7 91.3 92.1 91.0 91.5  PLT 271 282 253 229 288   Cardiac Enzymes: No results for input(s): CKTOTAL, CKMB, CKMBINDEX, TROPONINI in the last 168 hours. BNP: BNP (last 3 results) No results for input(s): BNP in the last 8760 hours.  ProBNP (last 3 results) No results for input(s): PROBNP in the last 8760 hours.  CBG: Recent Labs  Lab 09/10/19 1728  GLUCAP 142*       Signed:  Domenic Polite MD.  [facility] 09/14/2019, 3:15 PM

## 2019-09-14 NOTE — Care Management (Signed)
Spoke with patient's wife, who is at residence waiting for DME to be delivered.  She expresses that she only wants bed and 3n1 and will refuse all other DME.  She has been advised by [employer] [facility] that equipment will be delivered prior to 12:00 today.  She states it is fine for him to come home whenever possible and he can sit comfortably in his lift chair until bed arrives.  [facility] arranged.  [facility] to provided [facility] services.

## 2019-09-14 NOTE — Plan of Care (Signed)
  Problem: Health Behavior/Discharge Planning: Goal: Ability to manage health-related needs will improve Outcome: Progressing   Problem: Coping: Goal: Level of anxiety will decrease Outcome: Progressing   Problem: Pain Managment: Goal: General experience of comfort will improve Outcome: Progressing   

## 2019-09-16 ENCOUNTER — Telehealth: Payer: Self-pay

## 2019-09-16 ENCOUNTER — Ambulatory Visit (INDEPENDENT_AMBULATORY_CARE_PROVIDER_SITE_OTHER): Payer: Medicare HMO | Admitting: *Deleted

## 2019-09-16 DIAGNOSIS — J449 Chronic obstructive pulmonary disease, unspecified: Secondary | ICD-10-CM

## 2019-09-16 DIAGNOSIS — S72002A Fracture of unspecified part of neck of left femur, initial encounter for closed fracture: Secondary | ICD-10-CM

## 2019-09-16 DIAGNOSIS — Z8673 Personal history of transient ischemic attack (TIA), and cerebral infarction without residual deficits: Secondary | ICD-10-CM

## 2019-09-16 NOTE — Patient Instructions (Signed)
Visit Information  Goals Addressed              This Visit's Progress     Patient Stated   .  "He needs home health services" (pt-stated)        CARE PLAN ENTRY (see longitudinal plan of care for additional care plan information)  Current Barriers:  . Care Coordination needs related to home health services in a patient with left hip fracture, latent effects of CVA, and COPD (disease states) . Transportation  . Finances  Nurse Case Manager Clinical Goal(s):  [name] Over the next 24 hours, patient will have initiation of home health services.   Interventions:  . Inter-disciplinary care team collaboration (see longitudinal plan of care) . Chart reviewed including recent hospital notes, telephone notes, and case management notes o HH was ordered and [facility] accepted . Talked with patient's wife, [name] o She has not talked with anyone at [facility] but has left a message . Talked with Dana Allan with [facility] at [phone] o Confirmed acceptance o Services should start tomorrow. They will reach out to patient today o Discussed services ordered and role of nurse Aide . Danella Sensing that she needs to tell the RN or OT that she would like help with linen changes so that they can put that on the Aide's plan of care . Advised patient to reach out to [facility] if she has not received a call by this evening  Patient Self Care Activities:  . Unable to perform ADLs independently . Unable to perform IADLs independently  Initial goal documentation     .  COMPLETED: Wife States:"I'm worried that he's getting choked when he eats" (pt-stated)        Current Barriers:  [name] Knowledge Deficits related to potential food aspiration  in patient with hx of CVA and COPD  Nurse Case Manager Clinical Goal(s):  [name] Over the next 30 days, patient/wife will notify PCP if difficulty swallowing or chocking while eating returns  Interventions:  . Previously consulted by LCSW . Talked with wife by  telephone o Reports that he was having episodes of getting strangled/choked while eating and she was concerned that he was aspirating o This has improved and she hasn't noticed an episode in the last week or so . Advised to reach out to PCP with any new or worsening symptoms . Provided with RN CCM contact information and encouraged to reach out as needed  Patient Self Care Activities:  . Has assistance from wife for most ADLs  Please see past updates related to this goal by clicking on the "Past Updates" button in the selected goal         The patient verbalized understanding of instructions provided today and declined a print copy of patient instruction materials.   Follow-up Plan The care management team will reach out to the patient again over the next 15 days.   [name], BSN, RN-BC Embedded Chronic Care Manager [facility] Direct Dial: [phone]

## 2019-09-16 NOTE — Telephone Encounter (Signed)
TRANSITIONAL CARE MANAGEMENT TELEPHONE OUTREACH NOTE   Contact Date: 09/16/2019 Contacted By: Felicity Coyer   DISCHARGE INFORMATION Date of Discharge:09/14/2019 Discharge Facility: [facility] Principal Discharge Diagnosis:  Hip fracture, left, closed  Outpatient Follow Up Recommendations (copied from discharge summary) 1. PCP in 1 week 2. Orthopedic surgery Dr. Alvan Dame in 2 weeks 3. [facility] for voiding trial in 1 week 4. Home health PT OT RN, aide   Walter [name] is a male primary care patient of Dettinger, Fransisca Kaufmann, MD. An outgoing telephone call was made today and I spoke with his wife, Walter [name].  Walter [name] condition(s) and treatment(s) were discussed. An opportunity to ask questions was provided and all were answered or forwarded as appropriate.    ACTIVITIES OF DAILY LIVING  Walter [name] lives with their spouse and he cannot perform ADLs independently. his primary caregiver is his wife, Walter [name].. he is able to depend on his primary caregiver(s) for consistent help. Transportation to appointments, to pick up medications, and to run errands is a problem because of his hip fracture and prior CVA.    Fall Risk Fall Risk  07/31/2019 04/12/2019  Falls in the past year? 1 0  Number falls in past yr: 1 -  Injury with Fall? 1 -  Comment back pain. has not been evaulated for fx -  Risk Factor Category  - -  Risk for fall due to : History of fall(s);Impaired balance/gait -  Follow up Falls evaluation completed;Falls prevention discussed -    high Fall Risk   Home Modifications/Assistive Devices Wheelchair: Yes Cane: Yes Ramp: No Bedside Toilet: No Hospital Bed:  Yes Other: walker   [place] he is to receive home health Nursing, Physical therapy, Speech language pathology and aide services. His wife reports that she is waiting on a phone call from them to set up these services.  I provided the phone number for [facility] to her.    MEDICATION RECONCILIATION    Walter [name] has been able to pick-up all prescribed discharge medications from the pharmacy.   A post discharge medication reconciliation was performed and the complete medication list was reviewed with the patient/caregiver and is current as of 09/16/2019.   Discontinued Medications None  Current Medication List Allergies as of 09/16/2019   No Known Allergies     Medication List       Accurate as of September 16, 2019 11:59 AM. If you have any questions, ask your nurse or doctor.        acetaminophen 325 MG tablet Commonly known as: TYLENOL Take 1-2 tablets (325-650 mg total) by mouth every 6 (six) hours as needed for mild pain (pain score 1-3).   amLODipine 5 MG tablet Commonly known as: NORVASC TAKE 1 TABLET EVERY DAY   aspirin 81 MG chewable tablet Chew 1 tablet (81 mg total) by mouth 2 (two) times daily. Start after 3-4days when blood in urine clears Start taking on: September 17, 2019   atorvastatin 40 MG tablet Commonly known as: LIPITOR Take 1 tablet (40 mg total) by mouth daily.   buPROPion 300 MG 24 hr tablet Commonly known as: WELLBUTRIN XL Take 1 tablet (300 mg total) by mouth daily.   clopidogrel 75 MG tablet Commonly known as: PLAVIX Take 1 tablet (75 mg total) by mouth daily. Restart after 3-4days after blood in urine clears Start taking on: September 17, 2019   ferrous sulfate 325 (65 FE) MG tablet Take 1 tablet (325 mg  total) by mouth 2 (two) times daily with a meal.   HYDROcodone-acetaminophen 7.5-325 MG tablet Commonly known as: NORCO Take 1 tablet by mouth every 6 (six) hours as needed for severe pain (pain score 7-10).   polyethylene glycol 17 g packet Commonly known as: MIRALAX / GLYCOLAX Take 17 g by mouth daily as needed for mild constipation.   tamsulosin 0.4 MG Caps capsule Commonly known as: FLOMAX Take 1 capsule (0.4 mg total) by mouth daily after breakfast.        PATIENT EDUCATION & FOLLOW-UP PLAN  An appointment for Transitional Care  Management is scheduled with Dettinger, Fransisca Kaufmann, MD on 09/19/2019 at 2:10 pm.  Take all medications as prescribed  Contact our office by calling [phone] if you have any questions or concerns

## 2019-09-16 NOTE — Chronic Care Management (AMB) (Signed)
Chronic Care Management   Follow Up Note   09/16/2019 Name: Walter [name] MRN: [id number] DOB: 18-Oct-1950  Referred by: Walter [name], Walter Kaufmann, MD Reason for referral : Chronic Care Management (incoming patient call)   Walter [name] is a 69 y.o. year old male who is a primary care patient of Walter [name], Walter Kaufmann, MD. The CCM team was consulted for assistance with chronic disease management and care coordination needs.    Review of patient status, including review of consultants reports, relevant laboratory and other test results, and collaboration with appropriate care team members and the patient's provider was performed as part of comprehensive patient evaluation and provision of chronic care management services.    SDOH (Social Determinants of Health) assessments performed: No See Care Plan activities for detailed interventions related to [facility])     Outpatient Encounter Medications as of 09/16/2019  Medication Sig  . acetaminophen (TYLENOL) 325 MG tablet Take 1-2 tablets (325-650 mg total) by mouth every 6 (six) hours as needed for mild pain (pain score 1-3).  [name] amLODipine (NORVASC) 5 MG tablet TAKE 1 TABLET EVERY DAY (Patient taking differently: Take 5 mg by mouth daily. )  . [START ON 09/17/2019] aspirin 81 MG chewable tablet Chew 1 tablet (81 mg total) by mouth 2 (two) times daily. Start after 3-4days when blood in urine clears  . atorvastatin (LIPITOR) 40 MG tablet Take 1 tablet (40 mg total) by mouth daily.  [name] buPROPion (WELLBUTRIN XL) 300 MG 24 hr tablet Take 1 tablet (300 mg total) by mouth daily.  Derrill Memo ON 09/17/2019] clopidogrel (PLAVIX) 75 MG tablet Take 1 tablet (75 mg total) by mouth daily. Restart after 3-4days after blood in urine clears  . ferrous sulfate 325 (65 FE) MG tablet Take 1 tablet (325 mg total) by mouth 2 (two) times daily with a meal.  . HYDROcodone-acetaminophen (NORCO) 7.5-325 MG tablet Take 1 tablet by mouth every 6 (six) hours as needed for severe pain (pain  score 7-10).  . polyethylene glycol (MIRALAX / GLYCOLAX) 17 g packet Take 17 g by mouth daily as needed for mild constipation.  . tamsulosin (FLOMAX) 0.4 MG CAPS capsule Take 1 capsule (0.4 mg total) by mouth daily after breakfast.   No facility-administered encounter medications on file as of 09/16/2019.     RN Care Plan   .  "He needs home health services" (pt-stated)        CARE PLAN ENTRY (see longitudinal plan of care for additional care plan information)  Current Barriers:  . Care Coordination needs related to home health services in a patient with left hip fracture, latent effects of CVA, and COPD (disease states) . Transportation  . Finances  Nurse Case Manager Clinical Goal(s):  [name] Over the next 24 hours, patient will have initiation of home health services.   Interventions:  . Inter-disciplinary care team collaboration (see longitudinal plan of care) . Chart reviewed including recent hospital notes, telephone notes, and case management notes o HH was ordered and [facility] accepted . Talked with patient's wife, Walter [name] o She has not talked with anyone at [facility] but has left a message . Talked with Walter [name] with [facility] at [phone] o Confirmed acceptance o Services should start tomorrow. They will reach out to patient today o Discussed services ordered and role of nurse Aide . Walter [name] that she needs to tell the RN or OT that she would like help with linen changes so that they can put that on the Aide's  plan of care . Advised patient to reach out to [facility] if she has not received a call by this evening  Patient Self Care Activities:  . Unable to perform ADLs independently . Unable to perform IADLs independently  Initial goal documentation         Plan:  The care management team will reach out to the patient again over the next 15 days.    Walter [name], BSN, RN-BC Embedded Chronic Care Manager [facility] [place]  Management Direct Dial: [phone]

## 2019-09-17 ENCOUNTER — Telehealth: Payer: Self-pay | Admitting: *Deleted

## 2019-09-17 DIAGNOSIS — I10 Essential (primary) hypertension: Secondary | ICD-10-CM | POA: Diagnosis not present

## 2019-09-17 DIAGNOSIS — J449 Chronic obstructive pulmonary disease, unspecified: Secondary | ICD-10-CM | POA: Diagnosis not present

## 2019-09-17 DIAGNOSIS — I69322 Dysarthria following cerebral infarction: Secondary | ICD-10-CM | POA: Diagnosis not present

## 2019-09-17 DIAGNOSIS — R1310 Dysphagia, unspecified: Secondary | ICD-10-CM | POA: Diagnosis not present

## 2019-09-17 DIAGNOSIS — I69318 Other symptoms and signs involving cognitive functions following cerebral infarction: Secondary | ICD-10-CM | POA: Diagnosis not present

## 2019-09-17 DIAGNOSIS — I69391 Dysphagia following cerebral infarction: Secondary | ICD-10-CM | POA: Diagnosis not present

## 2019-09-17 DIAGNOSIS — I2510 Atherosclerotic heart disease of native coronary artery without angina pectoris: Secondary | ICD-10-CM | POA: Diagnosis not present

## 2019-09-17 DIAGNOSIS — S72002D Fracture of unspecified part of neck of left femur, subsequent encounter for closed fracture with routine healing: Secondary | ICD-10-CM | POA: Diagnosis not present

## 2019-09-17 DIAGNOSIS — R339 Retention of urine, unspecified: Secondary | ICD-10-CM

## 2019-09-17 DIAGNOSIS — F0150 Vascular dementia without behavioral disturbance: Secondary | ICD-10-CM | POA: Diagnosis not present

## 2019-09-17 NOTE — Telephone Encounter (Signed)
Call from Walter [name] w/ [facility] SOC today, pt came home Saturday with a catheter d/t retention but not any orders. Needs orders or referral to Urology Please advise and let [facility] nurse know

## 2019-09-18 ENCOUNTER — Telehealth: Payer: Medicare HMO

## 2019-09-18 DIAGNOSIS — I2510 Atherosclerotic heart disease of native coronary artery without angina pectoris: Secondary | ICD-10-CM | POA: Diagnosis not present

## 2019-09-18 DIAGNOSIS — I69318 Other symptoms and signs involving cognitive functions following cerebral infarction: Secondary | ICD-10-CM | POA: Diagnosis not present

## 2019-09-18 DIAGNOSIS — S72002D Fracture of unspecified part of neck of left femur, subsequent encounter for closed fracture with routine healing: Secondary | ICD-10-CM | POA: Diagnosis not present

## 2019-09-18 DIAGNOSIS — R1310 Dysphagia, unspecified: Secondary | ICD-10-CM | POA: Diagnosis not present

## 2019-09-18 DIAGNOSIS — I69391 Dysphagia following cerebral infarction: Secondary | ICD-10-CM | POA: Diagnosis not present

## 2019-09-18 DIAGNOSIS — I10 Essential (primary) hypertension: Secondary | ICD-10-CM | POA: Diagnosis not present

## 2019-09-18 DIAGNOSIS — I69322 Dysarthria following cerebral infarction: Secondary | ICD-10-CM | POA: Diagnosis not present

## 2019-09-18 DIAGNOSIS — F0150 Vascular dementia without behavioral disturbance: Secondary | ICD-10-CM | POA: Diagnosis not present

## 2019-09-18 DIAGNOSIS — J449 Chronic obstructive pulmonary disease, unspecified: Secondary | ICD-10-CM | POA: Diagnosis not present

## 2019-09-18 NOTE — Telephone Encounter (Signed)
Placed referral to urology urgent for retention and home health orders for catheter

## 2019-09-18 NOTE — Addendum Note (Signed)
Addended by: Caryl Pina on: 09/18/2019 07:52 AM   Modules accepted: Orders

## 2019-09-19 ENCOUNTER — Ambulatory Visit (INDEPENDENT_AMBULATORY_CARE_PROVIDER_SITE_OTHER): Payer: Medicare HMO | Admitting: Family Medicine

## 2019-09-19 ENCOUNTER — Telehealth: Payer: Self-pay

## 2019-09-19 ENCOUNTER — Encounter: Payer: Self-pay | Admitting: Family Medicine

## 2019-09-19 DIAGNOSIS — S72002D Fracture of unspecified part of neck of left femur, subsequent encounter for closed fracture with routine healing: Secondary | ICD-10-CM | POA: Diagnosis not present

## 2019-09-19 DIAGNOSIS — I69318 Other symptoms and signs involving cognitive functions following cerebral infarction: Secondary | ICD-10-CM | POA: Diagnosis not present

## 2019-09-19 DIAGNOSIS — S7292XD Unspecified fracture of left femur, subsequent encounter for closed fracture with routine healing: Secondary | ICD-10-CM

## 2019-09-19 DIAGNOSIS — I10 Essential (primary) hypertension: Secondary | ICD-10-CM | POA: Diagnosis not present

## 2019-09-19 DIAGNOSIS — I69391 Dysphagia following cerebral infarction: Secondary | ICD-10-CM | POA: Diagnosis not present

## 2019-09-19 DIAGNOSIS — R339 Retention of urine, unspecified: Secondary | ICD-10-CM

## 2019-09-19 DIAGNOSIS — J449 Chronic obstructive pulmonary disease, unspecified: Secondary | ICD-10-CM | POA: Diagnosis not present

## 2019-09-19 DIAGNOSIS — I69322 Dysarthria following cerebral infarction: Secondary | ICD-10-CM | POA: Diagnosis not present

## 2019-09-19 DIAGNOSIS — I2510 Atherosclerotic heart disease of native coronary artery without angina pectoris: Secondary | ICD-10-CM | POA: Diagnosis not present

## 2019-09-19 DIAGNOSIS — R1310 Dysphagia, unspecified: Secondary | ICD-10-CM | POA: Diagnosis not present

## 2019-09-19 DIAGNOSIS — F0150 Vascular dementia without behavioral disturbance: Secondary | ICD-10-CM | POA: Diagnosis not present

## 2019-09-19 NOTE — Telephone Encounter (Signed)
Contacted April with [facility] and advised that Dr Dettinger is requesting a CBC and a CMP. April stated that they are seeing patient tomorrow and would take care of this then. Per Dr Dettinger patient currently has a catheter and he would like for home health to do a voiding trial if possible because due to broken hip he can't make it to the urology office. April advised that she would contact her supervisor to ask and would call us back with more info.

## 2019-09-19 NOTE — Progress Notes (Signed)
Virtual Visit via telephone Note  I connected with Walter [name] on 09/19/19 at 1431 by telephone and verified that I am speaking with the correct person using two identifiers. Walter [name] is currently located at home and [employer] are currently with her during visit. The provider, Fransisca Kaufmann Meshawn Oconnor, MD is located in their office at time of visit.  Call ended at 1455  I discussed the limitations, risks, security and privacy concerns of performing an evaluation and management service by telephone and the availability of in person appointments. I also discussed with the patient that there may be a patient responsible charge related to this service. The patient expressed understanding and agreed to proceed.   History and Present Illness: Walter [name] out of his truck onto the concrete on 09/06/19 at [facility] and transferred to [facility] on 09/07/19. Left hip was fractured and had hemiarthroplasty by Dr Alvan Dame. He was d/c'd on the 13th. He has [facility] for home health therapy and nursing. He was also having urinary retention. He had to have a catheter because of BPH and difficulty urinating.  He failed voiding trials.  Because of his immobility he is unable to get up and go to out and make to appts yet.  He has surgery follow up on the 23rd  1. Urinary retention   2. Closed fracture of left femur with routine healing, unspecified fracture morphology, unspecified portion of femur, subsequent encounter     Outpatient Encounter Medications as of 09/19/2019  Medication Sig  . acetaminophen (TYLENOL) 325 MG tablet Take 1-2 tablets (325-650 mg total) by mouth every 6 (six) hours as needed for mild pain (pain score 1-3).  [name] amLODipine (NORVASC) 5 MG tablet TAKE 1 TABLET EVERY DAY (Patient taking differently: Take 5 mg by mouth daily. )  . aspirin 81 MG chewable tablet Chew 1 tablet (81 mg total) by mouth 2 (two) times daily. Start after 3-4days when blood in urine clears  . atorvastatin (LIPITOR) 40 MG  tablet Take 1 tablet (40 mg total) by mouth daily.  [name] buPROPion (WELLBUTRIN XL) 300 MG 24 hr tablet Take 1 tablet (300 mg total) by mouth daily.  . clopidogrel (PLAVIX) 75 MG tablet Take 1 tablet (75 mg total) by mouth daily. Restart after 3-4days after blood in urine clears  . ferrous sulfate 325 (65 FE) MG tablet Take 1 tablet (325 mg total) by mouth 2 (two) times daily with a meal.  . HYDROcodone-acetaminophen (NORCO) 7.5-325 MG tablet Take 1 tablet by mouth every 6 (six) hours as needed for severe pain (pain score 7-10).  . polyethylene glycol (MIRALAX / GLYCOLAX) 17 g packet Take 17 g by mouth daily as needed for mild constipation.  . tamsulosin (FLOMAX) 0.4 MG CAPS capsule Take 1 capsule (0.4 mg total) by mouth daily after breakfast.   No facility-administered encounter medications on file as of 09/19/2019.    Review of Systems  Constitutional: Negative for chills and fever.  Respiratory: Negative for shortness of breath and wheezing.   Cardiovascular: Negative for chest pain and leg swelling.  Musculoskeletal: Positive for arthralgias, back pain, gait problem and myalgias.  Skin: Negative for rash.  All other systems reviewed and are negative.   Observations/Objective: Patient's wife is historian   Assessment and Plan: Problem List Items Addressed This Visit    None    Visit Diagnoses    Urinary retention    -  Primary   Relevant Orders   CBC with Differential/Platelet  CMP14+EGFR   Closed fracture of left femur with routine healing, unspecified fracture morphology, unspecified portion of femur, subsequent encounter       Relevant Orders   CBC with Differential/Platelet   CMP14+EGFR      Will call [facility] and see if we can get them to do a voiding trial and draw some blood for the patient.  He still has catheter in place need orders for voiding trials, if does well on voiding trials then will do orders to remove the catheter.  If he does not do well then we will try  again in 1 week. Follow up plan: Return in about 3 months (around 12/20/2019), or if symptoms worsen or fail to improve, for Follow-up blood work in person hopefully.     I discussed the assessment and treatment plan with the patient. The patient was provided an opportunity to ask questions and all were answered. The patient agreed with the plan and demonstrated an understanding of the instructions.   The patient was advised to call back or seek an in-person evaluation if the symptoms worsen or if the condition fails to improve as anticipated.  The above assessment and management plan was discussed with the patient. The patient verbalized understanding of and has agreed to the management plan. Patient is aware to call the clinic if symptoms persist or worsen. Patient is aware when to return to the clinic for a follow-up visit. Patient educated on when it is appropriate to go to the emergency department.    I provided 24 minutes of non-face-to-face time during this encounter.    Worthy Rancher, MD

## 2019-09-20 ENCOUNTER — Ambulatory Visit: Payer: Self-pay

## 2019-09-20 DIAGNOSIS — I10 Essential (primary) hypertension: Secondary | ICD-10-CM

## 2019-09-20 DIAGNOSIS — Z91199 Patient's noncompliance with other medical treatment and regimen due to unspecified reason: Secondary | ICD-10-CM

## 2019-09-20 DIAGNOSIS — J449 Chronic obstructive pulmonary disease, unspecified: Secondary | ICD-10-CM

## 2019-09-20 DIAGNOSIS — F339 Major depressive disorder, recurrent, unspecified: Secondary | ICD-10-CM

## 2019-09-20 DIAGNOSIS — E782 Mixed hyperlipidemia: Secondary | ICD-10-CM

## 2019-09-20 DIAGNOSIS — Z8673 Personal history of transient ischemic attack (TIA), and cerebral infarction without residual deficits: Secondary | ICD-10-CM

## 2019-09-20 DIAGNOSIS — Z720 Tobacco use: Secondary | ICD-10-CM

## 2019-09-20 NOTE — Patient Instructions (Signed)
Visit Information  Goals Addressed      Patient Stated   .  "Client said he wanted to talk to someone about depression and managing depression" (pt-stated)   On track     Current Barriers:   Walter [name] Kitchen Mental Health Challenges in patient with Chronic Diagnoses of Depression, Hyperlipidemia, COPD , HTN, and Hx of CVA . Difficulty walking (uses cane to walk) . Fatigues easily . Hygiene challenges . Smokes daily  Clinical Social Work Clinical Goal(s):  Walter [name] Kitchen Over the next 30 days, client will work with LCSW to address concerns related to depression and mangement of depression  symptoms of client  Interventions: . LCSW talked with client/spouse of client about managing depression symptoms of client  . Encouraged client or spouse of client to talk with RN CM regarding nursing needs of client . Encouraged that client socialize with family or friends as a way to help manage depression symptoms   .   Encouraged client to use relaxation techniques to help manage depression symptoms (watch TV, walking outside, listen to music,visit with sister)   .  Talked with client about client's completion of ADLs    .  Talked with spouse of client about smoking of client  CCM RN CM Interventions 09/20/19 call completed with wife Walter [name]  . Received an EMMI Alert stating patient selected "Yes" to feeling "lost interest in things" and "sad/hopeless/anxious/empty" . Determined wife feels her spouse is actually showing improvements in his mood and depression since starting "his medication" and he is able to "move a Walter [name] easier" . Determined Walter [name] nor his wife have any concerns related to his depression or mood today . Discussed HHS started this week through [facility] and the patient is actively participating with these services as recommended; he is receiving OT, PT and SNV . Discussed plans with patient for ongoing care management follow up and provided patient with direct contact information for care management  team  Patient Self Care Activities:  . Attends scheduled provider appointments . Takes medications as prescribed . Drives self to appointments  Plan:  LCSW to communicate with client/spouse in next 3 weeks to discuss management of depression symptoms of client  Client/spouse to call LCSW as needed to discuss depression issues of client  Client/spouse to talk with RN CM Walter [name] to discuss nursing needs of client  Client to use relaxation techniques to help manage depression symptoms. (listen to music, walk outdoors, watch TV)  Talked with client about client completion of ADLs.  Please see past updates related to this goal by clicking on the "Past Updates" button in the selected goal     .  COMPLETED: "He needs home health services" (pt-stated)        Walter [name] (see longitudinal plan of care for additional care plan information)  Current Barriers:  . Care Coordination needs related to home health services in a patient with left hip fracture, latent effects of CVA, and COPD (disease states) . Transportation  . Finances  Nurse Case Manager Clinical Goal(s):  Walter [name] Kitchen Over the next 24 hours, patient will have initiation of home health services.   CCM RN CM Interventions:  09/20/19 call completed with wife Walter [name] . Inter-disciplinary care team collaboration (see longitudinal plan of care) . Talked with patient's wife, Walter [name] o She confirmed [facility] has initiated services this week for OT, PT and SNV . Determined the patient is actively participating with these services as recommended . Confirmed wife has the  contact number for [facility] if outreach is needed  . Discussed plans with patient for ongoing care management follow up and provided patient with direct contact information for care management team  Patient Self Care Activities:  . Unable to perform ADLs independently . Unable to perform IADLs independently  Please see past updates related to this goal  by clicking on the "Past Updates" button in the selected goal        Other   .  "we need help with transportation"   Not on track     Wife stated:   [name] (see longitudinal plan of care for additional care plan information)  Current Barriers:  Walter [name] Kitchen Knowledge Deficits related to resources to assist with transportation to medical doctor appointments  . Chronic Disease Management support and education needs related to Depression, Hyperlipidemia, COPD , HTN, and Hx of CVA . Transportation barriers . Closed fracture of left femur with routine healing, unspecified fracture morphology, unspecified portion of femur  Clinical Social Work Clinical Goal(s):  Walter [name] Kitchen Over the next 7 days, client will work with LCSW to address concerns related to lack of transportation to help transport patient/wife to upcoming medical appointments  CCM RN CM Interventions 09/20/19 call completed with wife Walter [name]  . Determined patient does not have transportation lined up for his Orthopedic appointment scheduled for 09/25/19 @11 :30 AM, located at [place], [place] . Determined wife attempted to use a private carrier but the cost exceeded $100 due to patient needing to travel to [place] . Discussed the embedded LCSW Walter [name] may be able to provide resources to assist with transporting patient to his appointment . Determined wife Walter [name] is appreciative of his assistance and will listen out for his call  . Sent in basket message to embedded LCSW Walter [name] with request to contact wife/patient to assist with transportation needs . Discussed plans with patient for ongoing care management follow up and provided patient with direct contact information for care management team  Patient Self Care Activities:  . Attends scheduled provider appointments . Takes medications as prescribed . Calls physician for concerns . Supportive wife to assist with care needs   Plan:  LCSW to communicate with  client/spouse   Initial goal documentation        Patient verbalizes understanding of instructions provided today.   Telephone follow up appointment with care management team member scheduled for: 09/26/19  Barb Merino, RN, BSN, CCM Care Management Coordinator [place] [facility]  Direct Phone: [phone]

## 2019-09-20 NOTE — Chronic Care Management (AMB) (Signed)
Chronic Care Management   Follow Up Note   09/20/2019 Name: MARKEISE MATHEWS MRN: [id number] DOB: 12-07-1950  Referred by: Dettinger, Fransisca Kaufmann, MD Reason for referral : No chief complaint on file.   HOWARD BUNTE is a 69 y.o. year old male who is a primary care patient of Dettinger, Fransisca Kaufmann, MD. The CCM team was consulted for assistance with chronic disease management and care coordination needs.    Review of patient status, including review of consultants reports, relevant laboratory and other test results, and collaboration with appropriate care team members and the patient's provider was performed as part of comprehensive patient evaluation and provision of chronic care management services.    SDOH (Social Determinants of Health) assessments performed: Yes - transportation  See Care Plan activities for detailed interventions related to [place])   Placed outbound CCM RN CM call to patient and wife [name] to follow up on an EMMI Red Alert received concerning patient's mood and depression.     Outpatient Encounter Medications as of 09/20/2019  Medication Sig  . acetaminophen (TYLENOL) 325 MG tablet Take 1-2 tablets (325-650 mg total) by mouth every 6 (six) hours as needed for mild pain (pain score 1-3).  [name] amLODipine (NORVASC) 5 MG tablet TAKE 1 TABLET EVERY DAY (Patient taking differently: Take 5 mg by mouth daily. )  . aspirin 81 MG chewable tablet Chew 1 tablet (81 mg total) by mouth 2 (two) times daily. Start after 3-4days when blood in urine clears  . atorvastatin (LIPITOR) 40 MG tablet Take 1 tablet (40 mg total) by mouth daily.  [name] buPROPion (WELLBUTRIN XL) 300 MG 24 hr tablet Take 1 tablet (300 mg total) by mouth daily.  . clopidogrel (PLAVIX) 75 MG tablet Take 1 tablet (75 mg total) by mouth daily. Restart after 3-4days after blood in urine clears  . ferrous sulfate 325 (65 FE) MG tablet Take 1 tablet (325 mg total) by mouth 2 (two) times daily with a meal.  . HYDROcodone-acetaminophen  (NORCO) 7.5-325 MG tablet Take 1 tablet by mouth every 6 (six) hours as needed for severe pain (pain score 7-10).  . polyethylene glycol (MIRALAX / GLYCOLAX) 17 g packet Take 17 g by mouth daily as needed for mild constipation.  . tamsulosin (FLOMAX) 0.4 MG CAPS capsule Take 1 capsule (0.4 mg total) by mouth daily after breakfast.   No facility-administered encounter medications on file as of 09/20/2019.     Objective:  Lab Results  Component Value Date   HGBA1C 5.8 (H) 02/18/2012   Lab Results  Component Value Date   LDLCALC 110 (H) 04/12/2019   CREATININE 0.87 09/14/2019   BP Readings from Last 3 Encounters:  09/14/19 132/72  04/12/19 (!) 158/84  10/10/18 138/79    Goals Addressed      Patient Stated   .  "Client said he wanted to talk to someone about depression and managing depression" (pt-stated)   On track     Current Barriers:   [name] Mental Health Challenges in patient with Chronic Diagnoses of Depression, Hyperlipidemia, COPD , HTN, and Hx of CVA . Difficulty walking (uses cane to walk) . Fatigues easily . Hygiene challenges . Smokes daily  Clinical Social Work Clinical Goal(s):  [name] Over the next 30 days, client will work with LCSW to address concerns related to depression and mangement of depression  symptoms of client  Interventions: . LCSW talked with client/spouse of client about managing depression symptoms of client  . Encouraged client or  spouse of client to talk with RN CM regarding nursing needs of client . Encouraged that client socialize with family or friends as a way to help manage depression symptoms   .   Encouraged client to use relaxation techniques to help manage depression symptoms (watch TV, walking outside, listen to music,visit with sister)   .  Talked with client about client's completion of ADLs    .  Talked with spouse of client about smoking of client  CCM RN CM Interventions 09/20/19 call completed with wife [name]  . Received an EMMI Alert  stating patient selected "Yes" to feeling "lost interest in things" and "sad/hopeless/anxious/empty" . Determined wife feels her spouse is actually showing improvements in his mood and depression since starting "his medication" and he is able to "move a [place] easier" . Determined Mr. Yo nor his wife have any concerns related to his depression or mood today . Discussed HHS started this week through [facility] and the patient is actively participating with these services as recommended; he is receiving OT, PT and SNV . Discussed plans with patient for ongoing care management follow up and provided patient with direct contact information for care management team  Patient Self Care Activities:  . Attends scheduled provider appointments . Takes medications as prescribed . Drives self to appointments  Plan:  LCSW to communicate with client/spouse in next 3 weeks to discuss management of depression symptoms of client  Client/spouse to call LCSW as needed to discuss depression issues of client  Client/spouse to talk with RN CM [name] to discuss nursing needs of client  Client to use relaxation techniques to help manage depression symptoms. (listen to music, walk outdoors, watch TV)  Talked with client about client completion of ADLs.  Please see past updates related to this goal by clicking on the "Past Updates" button in the selected goal     .  COMPLETED: "He needs home health services" (pt-stated)        [place] (see longitudinal plan of care for additional care plan information)  Current Barriers:  . Care Coordination needs related to home health services in a patient with left hip fracture, latent effects of CVA, and COPD (disease states) . Transportation  . Finances  Nurse Case Manager Clinical Goal(s):  [name] Over the next 24 hours, patient will have initiation of home health services.   CCM RN CM Interventions:  09/20/19 call completed with wife  [name] . Inter-disciplinary care team collaboration (see longitudinal plan of care) . Talked with patient's wife, [name] o She confirmed [facility] has initiated services this week for OT, PT and SNV . Determined the patient is actively participating with these services as recommended . Confirmed wife has the contact number for [facility] if outreach is needed  . Discussed plans with patient for ongoing care management follow up and provided patient with direct contact information for care management team  Patient Self Care Activities:  . Unable to perform ADLs independently . Unable to perform IADLs independently  Please see past updates related to this goal by clicking on the "Past Updates" button in the selected goal        Other   .  "we need help with transportation"   Not on track     Wife stated:   [place] (see longitudinal plan of care for additional care plan information)  Current Barriers:  [name] Knowledge Deficits related to resources to assist with transportation  to medical doctor appointments  . Chronic Disease Management support and education needs related to Depression, Hyperlipidemia, COPD , HTN, and Hx of CVA . Transportation barriers . Closed fracture of left femur with routine healing, unspecified fracture morphology, unspecified portion of femur  Clinical Social Work Clinical Goal(s):  [name] Over the next 7 days, client will work with LCSW to address concerns related to lack of transportation to help transport patient/wife to upcoming medical appointments  CCM RN CM Interventions 09/20/19 call completed with wife [name]  . Determined patient does not have transportation lined up for his Orthopedic appointment scheduled for 09/25/19 @11 :30 AM, located at [place], [place] . Determined wife attempted to use a private carrier but the cost exceeded $100 due to patient needing to travel to [place] . Discussed the embedded LCSW [name]  may be able to provide resources to assist with transporting patient to his appointment . Determined wife [name] is appreciative of his assistance and will listen out for his call  . Sent in basket message to embedded LCSW [name] with request to contact wife/patient to assist with transportation needs . Discussed plans with patient for ongoing care management follow up and provided patient with direct contact information for care management team  Patient Self Care Activities:  . Attends scheduled provider appointments . Takes medications as prescribed . Calls physician for concerns . Supportive wife to assist with care needs   Plan:  LCSW to communicate with client/spouse   Initial goal documentation        Plan:   The care management team will reach out to the patient again over the next 7 days.   Barb Merino, RN, BSN, CCM Care Management Coordinator [place] [facility]  Direct Phone: [phone]

## 2019-09-21 ENCOUNTER — Encounter: Payer: Self-pay | Admitting: Family Medicine

## 2019-09-21 DIAGNOSIS — S72002D Fracture of unspecified part of neck of left femur, subsequent encounter for closed fracture with routine healing: Secondary | ICD-10-CM | POA: Diagnosis not present

## 2019-09-21 DIAGNOSIS — R1310 Dysphagia, unspecified: Secondary | ICD-10-CM | POA: Diagnosis not present

## 2019-09-21 DIAGNOSIS — F0150 Vascular dementia without behavioral disturbance: Secondary | ICD-10-CM | POA: Diagnosis not present

## 2019-09-21 DIAGNOSIS — I69318 Other symptoms and signs involving cognitive functions following cerebral infarction: Secondary | ICD-10-CM | POA: Diagnosis not present

## 2019-09-21 DIAGNOSIS — I2510 Atherosclerotic heart disease of native coronary artery without angina pectoris: Secondary | ICD-10-CM | POA: Diagnosis not present

## 2019-09-21 DIAGNOSIS — I10 Essential (primary) hypertension: Secondary | ICD-10-CM | POA: Diagnosis not present

## 2019-09-21 DIAGNOSIS — I69391 Dysphagia following cerebral infarction: Secondary | ICD-10-CM | POA: Diagnosis not present

## 2019-09-21 DIAGNOSIS — I69322 Dysarthria following cerebral infarction: Secondary | ICD-10-CM | POA: Diagnosis not present

## 2019-09-21 DIAGNOSIS — J449 Chronic obstructive pulmonary disease, unspecified: Secondary | ICD-10-CM | POA: Diagnosis not present

## 2019-09-23 ENCOUNTER — Telehealth: Payer: Self-pay | Admitting: *Deleted

## 2019-09-23 DIAGNOSIS — I69318 Other symptoms and signs involving cognitive functions following cerebral infarction: Secondary | ICD-10-CM | POA: Diagnosis not present

## 2019-09-23 DIAGNOSIS — I10 Essential (primary) hypertension: Secondary | ICD-10-CM | POA: Diagnosis not present

## 2019-09-23 DIAGNOSIS — I2510 Atherosclerotic heart disease of native coronary artery without angina pectoris: Secondary | ICD-10-CM | POA: Diagnosis not present

## 2019-09-23 DIAGNOSIS — S72002D Fracture of unspecified part of neck of left femur, subsequent encounter for closed fracture with routine healing: Secondary | ICD-10-CM | POA: Diagnosis not present

## 2019-09-23 DIAGNOSIS — J449 Chronic obstructive pulmonary disease, unspecified: Secondary | ICD-10-CM | POA: Diagnosis not present

## 2019-09-23 DIAGNOSIS — I69391 Dysphagia following cerebral infarction: Secondary | ICD-10-CM | POA: Diagnosis not present

## 2019-09-23 DIAGNOSIS — F0150 Vascular dementia without behavioral disturbance: Secondary | ICD-10-CM | POA: Diagnosis not present

## 2019-09-23 DIAGNOSIS — R1310 Dysphagia, unspecified: Secondary | ICD-10-CM | POA: Diagnosis not present

## 2019-09-23 DIAGNOSIS — I69322 Dysarthria following cerebral infarction: Secondary | ICD-10-CM | POA: Diagnosis not present

## 2019-09-23 DIAGNOSIS — R339 Retention of urine, unspecified: Secondary | ICD-10-CM

## 2019-09-23 NOTE — Telephone Encounter (Signed)
Referral to Urologists placed.  

## 2019-09-23 NOTE — Addendum Note (Signed)
Addended by: Evelina Dun A on: 09/23/2019 02:35 PM   Modules accepted: Orders

## 2019-09-23 NOTE — Telephone Encounter (Signed)
Vm from April w/ [facility], they are unable to do voiding trial at the home per her supervisor. This would have to be done by the urologist

## 2019-09-24 ENCOUNTER — Telehealth: Payer: Self-pay | Admitting: *Deleted

## 2019-09-24 DIAGNOSIS — J449 Chronic obstructive pulmonary disease, unspecified: Secondary | ICD-10-CM | POA: Diagnosis not present

## 2019-09-24 DIAGNOSIS — I2510 Atherosclerotic heart disease of native coronary artery without angina pectoris: Secondary | ICD-10-CM | POA: Diagnosis not present

## 2019-09-24 DIAGNOSIS — Z9181 History of falling: Secondary | ICD-10-CM | POA: Diagnosis not present

## 2019-09-24 DIAGNOSIS — N2889 Other specified disorders of kidney and ureter: Secondary | ICD-10-CM | POA: Diagnosis not present

## 2019-09-24 DIAGNOSIS — I69391 Dysphagia following cerebral infarction: Secondary | ICD-10-CM | POA: Diagnosis not present

## 2019-09-24 DIAGNOSIS — F0150 Vascular dementia without behavioral disturbance: Secondary | ICD-10-CM | POA: Diagnosis not present

## 2019-09-24 DIAGNOSIS — I69318 Other symptoms and signs involving cognitive functions following cerebral infarction: Secondary | ICD-10-CM | POA: Diagnosis not present

## 2019-09-24 DIAGNOSIS — S72002D Fracture of unspecified part of neck of left femur, subsequent encounter for closed fracture with routine healing: Secondary | ICD-10-CM | POA: Diagnosis not present

## 2019-09-24 DIAGNOSIS — I10 Essential (primary) hypertension: Secondary | ICD-10-CM | POA: Diagnosis not present

## 2019-09-24 DIAGNOSIS — I69322 Dysarthria following cerebral infarction: Secondary | ICD-10-CM | POA: Diagnosis not present

## 2019-09-24 DIAGNOSIS — R1310 Dysphagia, unspecified: Secondary | ICD-10-CM | POA: Diagnosis not present

## 2019-09-24 NOTE — Telephone Encounter (Signed)
Pt has had 2 falls today. Also has blood in urine not able to pee, but he has a catheter in. Would like to get a UA & C/S. VO given to collect this. Pt has appt with Urology on 10/10/19

## 2019-09-25 ENCOUNTER — Telehealth: Payer: Self-pay | Admitting: Family Medicine

## 2019-09-25 DIAGNOSIS — I69322 Dysarthria following cerebral infarction: Secondary | ICD-10-CM | POA: Diagnosis not present

## 2019-09-25 DIAGNOSIS — F0150 Vascular dementia without behavioral disturbance: Secondary | ICD-10-CM | POA: Diagnosis not present

## 2019-09-25 DIAGNOSIS — R1310 Dysphagia, unspecified: Secondary | ICD-10-CM | POA: Diagnosis not present

## 2019-09-25 DIAGNOSIS — R6889 Other general symptoms and signs: Secondary | ICD-10-CM | POA: Diagnosis not present

## 2019-09-25 DIAGNOSIS — S72002D Fracture of unspecified part of neck of left femur, subsequent encounter for closed fracture with routine healing: Secondary | ICD-10-CM | POA: Diagnosis not present

## 2019-09-25 DIAGNOSIS — I69318 Other symptoms and signs involving cognitive functions following cerebral infarction: Secondary | ICD-10-CM | POA: Diagnosis not present

## 2019-09-25 DIAGNOSIS — J449 Chronic obstructive pulmonary disease, unspecified: Secondary | ICD-10-CM | POA: Diagnosis not present

## 2019-09-25 DIAGNOSIS — I69391 Dysphagia following cerebral infarction: Secondary | ICD-10-CM | POA: Diagnosis not present

## 2019-09-25 DIAGNOSIS — I2510 Atherosclerotic heart disease of native coronary artery without angina pectoris: Secondary | ICD-10-CM | POA: Diagnosis not present

## 2019-09-25 DIAGNOSIS — I10 Essential (primary) hypertension: Secondary | ICD-10-CM | POA: Diagnosis not present

## 2019-09-25 NOTE — Telephone Encounter (Signed)
Walter [name] states pt has a drs appt today and unavailable for PT.

## 2019-09-26 ENCOUNTER — Other Ambulatory Visit: Payer: Self-pay | Admitting: Family Medicine

## 2019-09-26 ENCOUNTER — Ambulatory Visit: Payer: Medicare HMO | Admitting: *Deleted

## 2019-09-26 DIAGNOSIS — S72002A Fracture of unspecified part of neck of left femur, initial encounter for closed fracture: Secondary | ICD-10-CM

## 2019-09-26 DIAGNOSIS — E782 Mixed hyperlipidemia: Secondary | ICD-10-CM | POA: Diagnosis not present

## 2019-09-26 DIAGNOSIS — I10 Essential (primary) hypertension: Secondary | ICD-10-CM

## 2019-09-26 DIAGNOSIS — Z8673 Personal history of transient ischemic attack (TIA), and cerebral infarction without residual deficits: Secondary | ICD-10-CM

## 2019-09-26 DIAGNOSIS — I69322 Dysarthria following cerebral infarction: Secondary | ICD-10-CM | POA: Diagnosis not present

## 2019-09-26 DIAGNOSIS — I69318 Other symptoms and signs involving cognitive functions following cerebral infarction: Secondary | ICD-10-CM | POA: Diagnosis not present

## 2019-09-26 DIAGNOSIS — F0150 Vascular dementia without behavioral disturbance: Secondary | ICD-10-CM | POA: Diagnosis not present

## 2019-09-26 DIAGNOSIS — R296 Repeated falls: Secondary | ICD-10-CM

## 2019-09-26 DIAGNOSIS — F339 Major depressive disorder, recurrent, unspecified: Secondary | ICD-10-CM | POA: Diagnosis not present

## 2019-09-26 DIAGNOSIS — R531 Weakness: Secondary | ICD-10-CM

## 2019-09-26 DIAGNOSIS — R1310 Dysphagia, unspecified: Secondary | ICD-10-CM | POA: Diagnosis not present

## 2019-09-26 DIAGNOSIS — J449 Chronic obstructive pulmonary disease, unspecified: Secondary | ICD-10-CM

## 2019-09-26 DIAGNOSIS — S72002D Fracture of unspecified part of neck of left femur, subsequent encounter for closed fracture with routine healing: Secondary | ICD-10-CM | POA: Diagnosis not present

## 2019-09-26 DIAGNOSIS — I69391 Dysphagia following cerebral infarction: Secondary | ICD-10-CM | POA: Diagnosis not present

## 2019-09-26 DIAGNOSIS — I2510 Atherosclerotic heart disease of native coronary artery without angina pectoris: Secondary | ICD-10-CM | POA: Diagnosis not present

## 2019-09-26 MED ORDER — DOXYCYCLINE HYCLATE 100 MG PO CAPS: 100.0000 mg | ORAL_CAPSULE | Freq: Two times a day (BID) | ORAL | 0 refills | Status: DC

## 2019-09-26 NOTE — Chronic Care Management (AMB) (Signed)
Chronic Care Management   Follow Up Note   09/26/2019 Name: Walter [name] MRN: [id number] DOB: 10/01/1950  Referred by: Dettinger, Fransisca Kaufmann, MD Reason for referral : Chronic Care Management (RN follow up)   Walter [name] is a 69 y.o. year old male who is a primary care patient of Dettinger, Fransisca Kaufmann, MD. The CCM team was consulted for assistance with chronic disease management and care coordination needs.    Review of patient status, including review of consultants reports, relevant laboratory and other test results, and collaboration with appropriate care team members and the patient's provider was performed as part of comprehensive patient evaluation and provision of chronic care management services.    SDOH (Social Determinants of Health) assessments performed: Yes-Transportation See Care Plan activities for detailed interventions related to [facility])     RN Care Plan    .  Wife states: "I'm worried that he doesn't eat very much" (pt-stated)   Not on track     Walter [name] (see longitudinal plan of care for additional care plan information)  Current Barriers:  . Care Coordination needs related to decreased appetite in a patient with hx of CVA (disease states)  Nurse Case Manager Clinical Goal(s):  [name] Over the next 45 days, patient will meet with RN Care Manager to address decreased appetite  Interventions:  . Inter-disciplinary care team collaboration (see longitudinal plan of care) . Chart reviewed . Talked with wife by telephone o States that he doesn't eat as much as he used to o They typically eat 2 meals a day and with a snack in between o Eating smaller portions than before o Doesn't use an supplements like Boost or Ensure. Doesn't like the way they taste . Discussed changes after CVA, decreased activity level, and potential changes in taste as possible reasons for decreased appetite . Discussed providing foods that he enjoys eating . Discussed monitoring weight and  advised to reach out to PCP with any noticeable weight loss . Advised that Megace may be an option to discuss with PCP if appetite doesn't improve or patient experiences weight loss . Provided with RN contact info and encouraged to reach out as needed  Patient Self Care Activities:  . Has assistance from wife with ADLs and IADLs since having CVA  Initial goal documentation     .  "we need help with transportation"   On track     Wife stated:   [place] (see longitudinal plan of care for additional care plan information)  Current Barriers:  [name] Knowledge Deficits related to resources to assist with transportation to medical doctor appointments  . Chronic Disease Management support and education needs related to Depression, Hyperlipidemia, COPD , HTN, and Hx of CVA . Transportation barriers . Closed fracture of left femur with routine healing, unspecified fracture morphology, unspecified portion of femur  Clinical Social Work Clinical Goal(s):  [name] Over the next 30 days, client will work with LCSW to address concerns related to lack of transportation to help transport patient/wife to upcoming medical appointments  CCM RN CM Interventions . Chart reviewed including recent office and telephone notes . Read staff message from Barb Merino, RN . Collaborated with [name], LCSW regarding transportation concerns and options . Spoke with patient's wife, Walter [name], by telephone o She was able to secure transportation to recent appt through [employer] o She will continue to use them in the future for out of town appointments . Encouraged patient/wife to reach out  to CCM team as needed  Patient Self Care Activities:  . Attends scheduled provider appointments . Takes medications as prescribed . Calls physician for concerns . Supportive wife to assist with care needs   Subsequent Documentation     .  Fall Prevention   Not on track     High fall risk in a patient with hx of  CVA, hypertension, and COPD  Current Barriers:  [name] Knowledge Deficits related to fall precautions . Decreased adherence to prescribed treatment for fall prevention . Cognitive deficits/dementia . Combative at times  Nurse Case Manager Clinical Goal(s):  [name] Over the next 30 days, patient will demonstrate improved adherence to prescribed treatment plan for decreasing falls as evidenced by patient reporting and review of EMR . Over the next 30 days, patient will verbalize using fall risk reduction strategies discussed . Over the next 90 days, patient will not experience additional falls  Interventions:  . Provided verbal education re: Potential causes of falls and Fall prevention strategies . Assessed patients knowledge of fall risk prevention secondary to previously provided education . Assessed for falls since last encounter . Discussed current home health services: OT, PT, nursing . Discussed most recent fall o [place] [address] backwards off of porch a couple of weeks ago and slipped off of the bed trying to get up alone last weekDiscussed current home health services: nursing, OT, PT . Encouraged patient to: [name] Utilize assistive devices appropriately with all ambulation . De-clutter walkways . Change positions slowly . Wear secure fitting shoes at all times with ambulation . Utilize home lighting for dim lit areas . Have self and pet awareness at all times . Discussed caregiver strain. Encouraged wife to talk with LCSW. Encouraged her to take time for herself when she has others available to stay with Walter [name].   Patient Self Care Activities:  . Patient depends on wife for assistance with ADLs and IADLs s/p CVA    Please see past updates related to this goal by clicking on the "Past Updates" button in the selected goal          Plan:   The care management team will reach out to the patient again over the next 30 days.   [name], BSN, RN-BC Embedded Chronic Care Manager [facility] [place] [facility] Direct Dial: [phone]

## 2019-09-26 NOTE — Patient Instructions (Signed)
Visit Information  Goals Addressed              This Visit's Progress     Patient Stated   .  COMPLETED: "I want something to help this toenail fungus to heal" (pt-stated)        Current Barriers:  [name] Knowledge Deficits related to cause and treatment of toenail fungus  Nurse Case Manager Clinical Goal(s):  [name] Over the next 2 days, patient will verbalize understanding of plan for treating toenail fungus . Over the next 7 days, patient will attend all scheduled medical appointments: PCP 08/17/18 and potential podiatry referral  Interventions:  . Provided education to patient re: typical toenail fungus treatment options and recommended podiatry referral to assess and treat thickening of toenails.   . Advised that podiatrist are able trim down the nail and are specially trained . Explained an oral medication is often necessary when the toenail is affected and that his liver may need to be monitored if started on either a topical or oral antifungal.   Patient Self Care Activities:  . Performs ADL's independently  Initial goal documentation      .  COMPLETED: "I want to make sure this place on his eye doesn't get bigger" (pt-stated)        Current Barriers:  [name] Knowledge Deficits related to cause and treatment options of eye lesion . Cognitive Deficits  Nurse Case Manager Clinical Goal(s):   Over the next 2 days, patient will keep appointment with PCP regarding eyelid lesion  Over the next 30 days, patient will follow provider's treatment recommendations for eyelid lesion  Interventions:   Reviewed chart  Consulted with patient's wife per LCSW request  Scheduled appointment with PCP  *follow up with PCP was changed to a telephone visit due to [id number] public health emergency        .  Wife states: "I'm worried that he doesn't eat very much" (pt-stated)   Not on track     [place] (see longitudinal plan of care for additional care plan information)  Current  Barriers:  . Care Coordination needs related to decreased appetite in a patient with hx of CVA (disease states)  Nurse Case Manager Clinical Goal(s):  [name] Over the next 45 days, patient will meet with RN Care Manager to address decreased appetite  Interventions:  . Inter-disciplinary care team collaboration (see longitudinal plan of care) . Chart reviewed . Talked with wife by telephone o States that he doesn't eat as much as he used to o They typically eat 2 meals a day and with a snack in between o Eating smaller portions than before o Doesn't use an supplements like Boost or Ensure. Doesn't like the way they taste . Discussed changes after CVA, decreased activity level, and potential changes in taste as possible reasons for decreased appetite . Discussed providing foods that he enjoys eating . Discussed monitoring weight and advised to reach out to PCP with any noticeable weight loss . Advised that Megace may be an option to discuss with PCP if appetite doesn't improve or patient experiences weight loss . Provided with RN contact info and encouraged to reach out as needed  Patient Self Care Activities:  . Has assistance from wife with ADLs and IADLs since having CVA  Initial goal documentation       Other   .  "we need help with transportation"   On track     Wife stated:   [place] (  see longitudinal plan of care for additional care plan information)  Current Barriers:  [name] Knowledge Deficits related to resources to assist with transportation to medical doctor appointments  . Chronic Disease Management support and education needs related to Depression, Hyperlipidemia, COPD , HTN, and Hx of CVA . Transportation barriers . Closed fracture of left femur with routine healing, unspecified fracture morphology, unspecified portion of femur  Clinical Social Work Clinical Goal(s):  [name] Over the next 30 days, client will work with LCSW to address concerns related to lack of  transportation to help transport patient/wife to upcoming medical appointments  CCM RN CM Interventions . Chart reviewed including recent office and telephone notes . Read staff message from Barb Merino, RN . Collaborated with [name], LCSW regarding transportation concerns and options . Spoke with patient's wife, [name], by telephone o She was able to secure transportation to recent appt through [employer] o She will continue to use them in the future for out of town appointments . Encouraged patient/wife to reach out to [facility] team as needed  Patient Self Care Activities:  . Attends scheduled provider appointments . Takes medications as prescribed . Calls physician for concerns . Supportive wife to assist with care needs   Subsequent Documentation     .  Fall Prevention   Not on track     High fall risk in a patient with hx of CVA, hypertension, and COPD  Current Barriers:  [name] Knowledge Deficits related to fall precautions . Decreased adherence to prescribed treatment for fall prevention . Cognitive deficits/dementia . Combative at times  Nurse Case Manager Clinical Goal(s):  [name] Over the next 30 days, patient will demonstrate improved adherence to prescribed treatment plan for decreasing falls as evidenced by patient reporting and review of EMR . Over the next 30 days, patient will verbalize using fall risk reduction strategies discussed . Over the next 90 days, patient will not experience additional falls  Interventions:  . Provided verbal education re: Potential causes of falls and Fall prevention strategies . Assessed patients knowledge of fall risk prevention secondary to previously provided education . Assessed for falls since last encounter . Discussed most recent fall o [place] [address] backwards off of porch a couple of weeks ago and slipped off of the bed trying to get up alone last week . Discussed current home health services: nursing, OT, PT . Encouraged patient  to: [name] Utilize assistive devices appropriately with all ambulation . De-clutter walkways . Change positions slowly . Wear secure fitting shoes at all times with ambulation . Utilize home lighting for dim lit areas . Have self and pet awareness at all times . Discussed caregiver strain. Encouraged wife to talk with LCSW. Encouraged her to take time for herself when she has others available to stay with Carloyn Manner.   Patient Self Care Activities:  . Patient depends on wife for assistance with ADLs and IADLs s/p CVA    Please see past updates related to this goal by clicking on the "Past Updates" button in the selected goal         Patient verbalizes understanding of instructions provided today.   Follow-up Plan The care management team will reach out to the patient again over the next 30 days.   [name], BSN, RN-BC Embedded Chronic Care Manager [facility] [place] [facility] Direct Dial: [phone]

## 2019-09-27 DIAGNOSIS — I10 Essential (primary) hypertension: Secondary | ICD-10-CM | POA: Diagnosis not present

## 2019-09-27 DIAGNOSIS — S72002D Fracture of unspecified part of neck of left femur, subsequent encounter for closed fracture with routine healing: Secondary | ICD-10-CM | POA: Diagnosis not present

## 2019-09-27 DIAGNOSIS — I69322 Dysarthria following cerebral infarction: Secondary | ICD-10-CM | POA: Diagnosis not present

## 2019-09-27 DIAGNOSIS — F0150 Vascular dementia without behavioral disturbance: Secondary | ICD-10-CM | POA: Diagnosis not present

## 2019-09-27 DIAGNOSIS — I2510 Atherosclerotic heart disease of native coronary artery without angina pectoris: Secondary | ICD-10-CM | POA: Diagnosis not present

## 2019-09-27 DIAGNOSIS — I69318 Other symptoms and signs involving cognitive functions following cerebral infarction: Secondary | ICD-10-CM | POA: Diagnosis not present

## 2019-09-27 DIAGNOSIS — R1310 Dysphagia, unspecified: Secondary | ICD-10-CM | POA: Diagnosis not present

## 2019-09-27 DIAGNOSIS — I69391 Dysphagia following cerebral infarction: Secondary | ICD-10-CM | POA: Diagnosis not present

## 2019-09-27 DIAGNOSIS — J449 Chronic obstructive pulmonary disease, unspecified: Secondary | ICD-10-CM | POA: Diagnosis not present

## 2019-09-30 ENCOUNTER — Other Ambulatory Visit: Payer: Self-pay

## 2019-09-30 ENCOUNTER — Ambulatory Visit (INDEPENDENT_AMBULATORY_CARE_PROVIDER_SITE_OTHER): Payer: Medicare HMO

## 2019-09-30 DIAGNOSIS — Z7982 Long term (current) use of aspirin: Secondary | ICD-10-CM

## 2019-09-30 DIAGNOSIS — I10 Essential (primary) hypertension: Secondary | ICD-10-CM

## 2019-09-30 DIAGNOSIS — I252 Old myocardial infarction: Secondary | ICD-10-CM

## 2019-09-30 DIAGNOSIS — I69322 Dysarthria following cerebral infarction: Secondary | ICD-10-CM

## 2019-09-30 DIAGNOSIS — F0150 Vascular dementia without behavioral disturbance: Secondary | ICD-10-CM | POA: Diagnosis not present

## 2019-09-30 DIAGNOSIS — I6529 Occlusion and stenosis of unspecified carotid artery: Secondary | ICD-10-CM

## 2019-09-30 DIAGNOSIS — S72002D Fracture of unspecified part of neck of left femur, subsequent encounter for closed fracture with routine healing: Secondary | ICD-10-CM | POA: Diagnosis not present

## 2019-09-30 DIAGNOSIS — I69391 Dysphagia following cerebral infarction: Secondary | ICD-10-CM

## 2019-09-30 DIAGNOSIS — Z7902 Long term (current) use of antithrombotics/antiplatelets: Secondary | ICD-10-CM

## 2019-09-30 DIAGNOSIS — I69318 Other symptoms and signs involving cognitive functions following cerebral infarction: Secondary | ICD-10-CM | POA: Diagnosis not present

## 2019-09-30 DIAGNOSIS — I2510 Atherosclerotic heart disease of native coronary artery without angina pectoris: Secondary | ICD-10-CM

## 2019-09-30 DIAGNOSIS — Z96649 Presence of unspecified artificial hip joint: Secondary | ICD-10-CM

## 2019-09-30 DIAGNOSIS — J449 Chronic obstructive pulmonary disease, unspecified: Secondary | ICD-10-CM | POA: Diagnosis not present

## 2019-09-30 DIAGNOSIS — N401 Enlarged prostate with lower urinary tract symptoms: Secondary | ICD-10-CM | POA: Diagnosis not present

## 2019-09-30 DIAGNOSIS — E782 Mixed hyperlipidemia: Secondary | ICD-10-CM

## 2019-09-30 DIAGNOSIS — Z9181 History of falling: Secondary | ICD-10-CM

## 2019-09-30 DIAGNOSIS — R1310 Dysphagia, unspecified: Secondary | ICD-10-CM | POA: Diagnosis not present

## 2019-09-30 DIAGNOSIS — N2889 Other specified disorders of kidney and ureter: Secondary | ICD-10-CM

## 2019-09-30 DIAGNOSIS — R338 Other retention of urine: Secondary | ICD-10-CM

## 2019-09-30 DIAGNOSIS — F17210 Nicotine dependence, cigarettes, uncomplicated: Secondary | ICD-10-CM

## 2019-09-30 DIAGNOSIS — Z96642 Presence of left artificial hip joint: Secondary | ICD-10-CM

## 2019-10-01 DIAGNOSIS — I10 Essential (primary) hypertension: Secondary | ICD-10-CM | POA: Diagnosis not present

## 2019-10-01 DIAGNOSIS — I2510 Atherosclerotic heart disease of native coronary artery without angina pectoris: Secondary | ICD-10-CM | POA: Diagnosis not present

## 2019-10-01 DIAGNOSIS — F0150 Vascular dementia without behavioral disturbance: Secondary | ICD-10-CM | POA: Diagnosis not present

## 2019-10-01 DIAGNOSIS — I69322 Dysarthria following cerebral infarction: Secondary | ICD-10-CM | POA: Diagnosis not present

## 2019-10-01 DIAGNOSIS — S72002D Fracture of unspecified part of neck of left femur, subsequent encounter for closed fracture with routine healing: Secondary | ICD-10-CM | POA: Diagnosis not present

## 2019-10-01 DIAGNOSIS — I69391 Dysphagia following cerebral infarction: Secondary | ICD-10-CM | POA: Diagnosis not present

## 2019-10-01 DIAGNOSIS — J449 Chronic obstructive pulmonary disease, unspecified: Secondary | ICD-10-CM | POA: Diagnosis not present

## 2019-10-01 DIAGNOSIS — R1310 Dysphagia, unspecified: Secondary | ICD-10-CM | POA: Diagnosis not present

## 2019-10-01 DIAGNOSIS — I69318 Other symptoms and signs involving cognitive functions following cerebral infarction: Secondary | ICD-10-CM | POA: Diagnosis not present

## 2019-10-02 DIAGNOSIS — I69318 Other symptoms and signs involving cognitive functions following cerebral infarction: Secondary | ICD-10-CM | POA: Diagnosis not present

## 2019-10-02 DIAGNOSIS — J449 Chronic obstructive pulmonary disease, unspecified: Secondary | ICD-10-CM | POA: Diagnosis not present

## 2019-10-02 DIAGNOSIS — F0150 Vascular dementia without behavioral disturbance: Secondary | ICD-10-CM | POA: Diagnosis not present

## 2019-10-02 DIAGNOSIS — I10 Essential (primary) hypertension: Secondary | ICD-10-CM | POA: Diagnosis not present

## 2019-10-02 DIAGNOSIS — S72002D Fracture of unspecified part of neck of left femur, subsequent encounter for closed fracture with routine healing: Secondary | ICD-10-CM | POA: Diagnosis not present

## 2019-10-02 DIAGNOSIS — I69322 Dysarthria following cerebral infarction: Secondary | ICD-10-CM | POA: Diagnosis not present

## 2019-10-02 DIAGNOSIS — R1310 Dysphagia, unspecified: Secondary | ICD-10-CM | POA: Diagnosis not present

## 2019-10-02 DIAGNOSIS — I69391 Dysphagia following cerebral infarction: Secondary | ICD-10-CM | POA: Diagnosis not present

## 2019-10-02 DIAGNOSIS — I2510 Atherosclerotic heart disease of native coronary artery without angina pectoris: Secondary | ICD-10-CM | POA: Diagnosis not present

## 2019-10-08 ENCOUNTER — Ambulatory Visit (INDEPENDENT_AMBULATORY_CARE_PROVIDER_SITE_OTHER): Payer: Medicare HMO | Admitting: *Deleted

## 2019-10-08 DIAGNOSIS — J449 Chronic obstructive pulmonary disease, unspecified: Secondary | ICD-10-CM | POA: Diagnosis not present

## 2019-10-08 DIAGNOSIS — I69391 Dysphagia following cerebral infarction: Secondary | ICD-10-CM | POA: Diagnosis not present

## 2019-10-08 DIAGNOSIS — I69322 Dysarthria following cerebral infarction: Secondary | ICD-10-CM | POA: Diagnosis not present

## 2019-10-08 DIAGNOSIS — N400 Enlarged prostate without lower urinary tract symptoms: Secondary | ICD-10-CM

## 2019-10-08 DIAGNOSIS — R531 Weakness: Secondary | ICD-10-CM

## 2019-10-08 DIAGNOSIS — I10 Essential (primary) hypertension: Secondary | ICD-10-CM | POA: Diagnosis not present

## 2019-10-08 DIAGNOSIS — E782 Mixed hyperlipidemia: Secondary | ICD-10-CM | POA: Diagnosis not present

## 2019-10-08 DIAGNOSIS — I2510 Atherosclerotic heart disease of native coronary artery without angina pectoris: Secondary | ICD-10-CM | POA: Diagnosis not present

## 2019-10-08 DIAGNOSIS — Z8673 Personal history of transient ischemic attack (TIA), and cerebral infarction without residual deficits: Secondary | ICD-10-CM

## 2019-10-08 DIAGNOSIS — I69318 Other symptoms and signs involving cognitive functions following cerebral infarction: Secondary | ICD-10-CM | POA: Diagnosis not present

## 2019-10-08 DIAGNOSIS — S72002D Fracture of unspecified part of neck of left femur, subsequent encounter for closed fracture with routine healing: Secondary | ICD-10-CM | POA: Diagnosis not present

## 2019-10-08 DIAGNOSIS — F0150 Vascular dementia without behavioral disturbance: Secondary | ICD-10-CM | POA: Diagnosis not present

## 2019-10-08 DIAGNOSIS — R1310 Dysphagia, unspecified: Secondary | ICD-10-CM | POA: Diagnosis not present

## 2019-10-08 DIAGNOSIS — F339 Major depressive disorder, recurrent, unspecified: Secondary | ICD-10-CM | POA: Diagnosis not present

## 2019-10-08 DIAGNOSIS — Z91199 Patient's noncompliance with other medical treatment and regimen due to unspecified reason: Secondary | ICD-10-CM

## 2019-10-08 NOTE — Patient Instructions (Signed)
Visit Information  Goals Addressed              This Visit's Progress     Patient Stated   .  Wife states: "He needs urology appt" (pt-stated)        CARE PLAN ENTRY (see longitudinal plan of care for additional care plan information)  Current Barriers:  . Care Coordination needs related to voiding trial in order to remove catheter in a patient with hx of recent CVA and BPH (disease states) . Transportation barriers . Cognitive Deficits  Nurse Case Manager Clinical Goal(s):  [name] Over the next 7 days, patient will have appointment with urologist  Interventions:  . Inter-disciplinary care team collaboration (see longitudinal plan of care) . Chart reviewed including recent office and telephone notes as well as referrals . Talked with wife by telephone . Discussed referral to [facility] . Discussed upcoming appt on 10/10/19 for voiding trial . Encouraged wife to reach out to [facility] team as needed  Patient Self Care Activities:  . Unable to perform ADLs independently . Unable to perform IADLs independently . Has assistance from wife  Initial goal documentation     .  Wife states: "I'm worried that he doesn't eat very much" (pt-stated)   Not on track     [place] (see longitudinal plan of care for additional care plan information)  Current Barriers:  . Care Coordination needs related to decreased appetite in a patient with hx of CVA (disease states)  Nurse Case Manager Clinical Goal(s):  [name] Over the next 45 days, patient will meet with RN Care Manager to address decreased appetite  Interventions:  . Inter-disciplinary care team collaboration (see longitudinal plan of care) . Chart reviewed . Talked with wife by telephone o States that he doesn't eat as much as he used to o They typically eat 2 meals a day and with a snack in between o Eating smaller portions than before o Doesn't use an supplements like Boost or Ensure. Doesn't like the way they taste o Started  taking multivitamin a couple of days ago . Previously discussed changes after CVA, decreased activity level, and potential changes in taste as possible reasons for decreased appetite . Previously discussed providing foods that he enjoys eating . Discussed monitoring weight and advised to reach out to PCP with any noticeable weight loss o Unable to weigh patient but wife reports that he has visibly lost weight. His clothes don't fit as well.  . Again discussed that Megace may be an option  . RN will collaborate with PCP regarding weight loss and megace . Provided with RN contact info and encouraged to reach out as needed  Patient Self Care Activities:  . Has assistance from wife with ADLs and IADLs since having CVA  Please see past updates related to this goal by clicking on the "Past Updates" button in the selected goal        Other   .  "we need help with transportation"   On track     Wife stated:   [place] (see longitudinal plan of care for additional care plan information)  Current Barriers:  [name] Knowledge Deficits related to resources to assist with transportation to medical doctor appointments  . Chronic Disease Management support and education needs related to Depression, Hyperlipidemia, COPD , HTN, and Hx of CVA . Transportation barriers . Closed fracture of left femur with routine healing, unspecified fracture morphology, unspecified portion of femur  Clinical Social  Work Clinical Goal(s):  [name] Over the next 30 days, client will work with LCSW to address concerns related to lack of transportation to help transport patient/wife to upcoming medical appointments  CCM RN CM Interventions . Chart reviewed including recent office and telephone notes . Read staff message from Barb Merino, RN . Collaborated with [name], LCSW regarding transportation concerns and options . Spoke with patient's wife, [name], by telephone o She was able to secure transportation to  recent appt through [employer] o She will continue to use them in the future for out of town appointments . Encouraged patient/wife to reach out to [facility] team as needed  Patient Self Care Activities:  . Attends scheduled provider appointments . Takes medications as prescribed . Calls physician for concerns . Supportive wife to assist with care needs   Subsequent Documentation     .  Fall Prevention   Not on track     High fall risk in a patient with hx of CVA, hypertension, and COPD  Current Barriers:  [name] Knowledge Deficits related to fall precautions . Decreased adherence to prescribed treatment for fall prevention . Cognitive deficits/dementia . Combative at times  Nurse Case Manager Clinical Goal(s):  [name] Over the next 30 days, patient will demonstrate improved adherence to prescribed treatment plan for decreasing falls as evidenced by patient reporting and review of EMR . Over the next 30 days, patient will verbalize using fall risk reduction strategies discussed . Over the next 90 days, patient will not experience additional falls  Interventions:  . Assessed for falls since last encounter . Discussed most recent fall o Stumbled in living room trying to get up and walk by himself . Discussed current home health services: nursing, OT, PT . Encouraged patient to: [name] Utilize assistive devices appropriately with all ambulation . De-clutter walkways . Change positions slowly . Wear secure fitting shoes at all times with ambulation . Utilize home lighting for dim lit areas . Have self and pet awareness at all times . Previously discussed caregiver strain. Encouraged wife to talk with LCSW. Encouraged her to take time for herself when she has others available to stay with Carloyn Manner.   Patient Self Care Activities:  . Patient depends on wife for assistance with ADLs and IADLs s/p CVA    Please see past updates related to this goal by clicking on the "Past Updates" button in the  selected goal         Patient verbalizes understanding of instructions provided today.   Follow-up Plan The care management team will reach out to the patient again over the next 15 days.   [name], BSN, RN-BC Embedded Chronic Care Manager [facility] / [place] [facility] Direct Dial: [phone]

## 2019-10-08 NOTE — Chronic Care Management (AMB) (Signed)
Chronic Care Management   Follow Up Note   10/08/2019 Name: Walter [name] MRN: [id number] DOB: [birth year]  Referred by: Dettinger, Fransisca Kaufmann, MD Reason for referral : Chronic Care Management (RN follow-up)   Walter [name] is a 69 y.o. year old male who is a primary care patient of Dettinger, Fransisca Kaufmann, MD. The CCM team was consulted for assistance with chronic disease management and care coordination needs.    Review of patient status, including review of consultants reports, relevant laboratory and other test results, and collaboration with appropriate care team members and the patient's provider was performed as part of comprehensive patient evaluation and provision of chronic care management services.    SDOH (Social Determinants of Health) assessments performed: No See Care Plan activities for detailed interventions related to [facility])     Outpatient Encounter Medications as of 10/08/2019  Medication Sig  . acetaminophen (TYLENOL) 325 MG tablet Take 1-2 tablets (325-650 mg total) by mouth every 6 (six) hours as needed for mild pain (pain score 1-3).  [name] amLODipine (NORVASC) 5 MG tablet TAKE 1 TABLET EVERY DAY (Patient taking differently: Take 5 mg by mouth daily. )  . aspirin 81 MG chewable tablet Chew 1 tablet (81 mg total) by mouth 2 (two) times daily. Start after 3-4days when blood in urine clears  . atorvastatin (LIPITOR) 40 MG tablet Take 1 tablet (40 mg total) by mouth daily.  [name] buPROPion (WELLBUTRIN XL) 300 MG 24 hr tablet Take 1 tablet (300 mg total) by mouth daily.  . clopidogrel (PLAVIX) 75 MG tablet Take 1 tablet (75 mg total) by mouth daily. Restart after 3-4days after blood in urine clears  . doxycycline (VIBRAMYCIN) 100 MG capsule Take 1 capsule (100 mg total) by mouth 2 (two) times daily.  . ferrous sulfate 325 (65 FE) MG tablet Take 1 tablet (325 mg total) by mouth 2 (two) times daily with a meal.  . HYDROcodone-acetaminophen (NORCO) 7.5-325 MG tablet Take 1 tablet by mouth  every 6 (six) hours as needed for severe pain (pain score 7-10).  . polyethylene glycol (MIRALAX / GLYCOLAX) 17 g packet Take 17 g by mouth daily as needed for mild constipation.  . tamsulosin (FLOMAX) 0.4 MG CAPS capsule Take 1 capsule (0.4 mg total) by mouth daily after breakfast.   No facility-administered encounter medications on file as of 10/08/2019.     RN Care Plan   .  Wife states: "He needs urology appt" (pt-stated)        CARE PLAN ENTRY (see longitudinal plan of care for additional care plan information)  Current Barriers:  . Care Coordination needs related to voiding trial in order to remove catheter in a patient with hx of recent CVA and BPH (disease states) . Transportation barriers . Cognitive Deficits  Nurse Case Manager Clinical Goal(s):  [name] Over the next 7 days, patient will have appointment with urologist  Interventions:  . Inter-disciplinary care team collaboration (see longitudinal plan of care) . Chart reviewed including recent office and telephone notes as well as referrals . Talked with wife by telephone . Discussed referral to [facility] . Discussed upcoming appt on 10/10/19 for voiding trial . Encouraged wife to reach out to [facility] team as needed  Patient Self Care Activities:  . Unable to perform ADLs independently . Unable to perform IADLs independently . Has assistance from wife  Initial goal documentation     .  Wife states: "I'm worried that he doesn't eat very much" (pt-stated)  Not on track     [place] (see longitudinal plan of care for additional care plan information)  Current Barriers:  . Care Coordination needs related to decreased appetite in a patient with hx of CVA (disease states)  Nurse Case Manager Clinical Goal(s):  [name] Over the next 45 days, patient will meet with RN Care Manager to address decreased appetite  Interventions:  . Inter-disciplinary care team collaboration (see longitudinal plan of care) . Chart  reviewed . Talked with wife by telephone o States that he doesn't eat as much as he used to o They typically eat 2 meals a day and with a snack in between o Eating smaller portions than before o Doesn't use an supplements like Boost or Ensure. Doesn't like the way they taste o Started taking multivitamin a couple of days ago . Previously discussed changes after CVA, decreased activity level, and potential changes in taste as possible reasons for decreased appetite . Previously discussed providing foods that he enjoys eating . Discussed monitoring weight and advised to reach out to PCP with any noticeable weight loss o Unable to weigh patient but wife reports that he has visibly lost weight. His clothes don't fit as well.  . Again discussed that Megace may be an option  . RN will collaborate with PCP regarding weight loss and megace . Provided with RN contact info and encouraged to reach out as needed  Patient Self Care Activities:  . Has assistance from wife with ADLs and IADLs since having CVA  Please see past updates related to this goal by clicking on the "Past Updates" button in the selected goal      .  "we need help with transportation"   On track     Wife stated:   [place] (see longitudinal plan of care for additional care plan information)  Current Barriers:  [name] Knowledge Deficits related to resources to assist with transportation to medical doctor appointments  . Chronic Disease Management support and education needs related to Depression, Hyperlipidemia, COPD , HTN, and Hx of CVA . Transportation barriers . Closed fracture of left femur with routine healing, unspecified fracture morphology, unspecified portion of femur  Clinical Social Work Clinical Goal(s):  [name] Over the next 30 days, client will work with LCSW to address concerns related to lack of transportation to help transport patient/wife to upcoming medical appointments  CCM RN CM Interventions . Chart  reviewed including recent office and telephone notes . Read staff message from Barb Merino, RN . Collaborated with [name], LCSW regarding transportation concerns and options . Spoke with patient's wife, [name], by telephone o She was able to secure transportation to recent appt through [employer] o She will continue to use them in the future for out of town appointments . Encouraged patient/wife to reach out to [facility] team as needed  Patient Self Care Activities:  . Attends scheduled provider appointments . Takes medications as prescribed . Calls physician for concerns . Supportive wife to assist with care needs   Subsequent Documentation     .  Fall Prevention   Not on track     High fall risk in a patient with hx of CVA, hypertension, and COPD  Current Barriers:  [name] Knowledge Deficits related to fall precautions . Decreased adherence to prescribed treatment for fall prevention . Cognitive deficits/dementia . Combative at times  Nurse Case Manager Clinical Goal(s):  [name] Over the next 30 days, patient will demonstrate improved adherence to  prescribed treatment plan for decreasing falls as evidenced by patient reporting and review of EMR . Over the next 30 days, patient will verbalize using fall risk reduction strategies discussed . Over the next 90 days, patient will not experience additional falls  Interventions:  . Assessed for falls since last encounter . Discussed most recent fall o Stumbled in living room trying to get up and walk by himself . Discussed current home health services: nursing, OT, PT . Encouraged patient to: [name] Utilize assistive devices appropriately with all ambulation . De-clutter walkways . Change positions slowly . Wear secure fitting shoes at all times with ambulation . Utilize home lighting for dim lit areas . Have self and pet awareness at all times . Previously discussed caregiver strain. Encouraged wife to talk with LCSW. Encouraged her to  take time for herself when she has others available to stay with Carloyn Manner.   Patient Self Care Activities:  . Patient depends on wife for assistance with ADLs and IADLs s/p CVA    Please see past updates related to this goal by clicking on the "Past Updates" button in the selected goal          Plan:   The care management team will reach out to the patient again over the next 15 days.    [name], BSN, RN-BC Embedded Chronic Care Manager [facility] / [place] [facility] Direct Dial: [phone]

## 2019-10-09 ENCOUNTER — Other Ambulatory Visit: Payer: Self-pay | Admitting: Family Medicine

## 2019-10-09 MED ORDER — MIRTAZAPINE 15 MG PO TABS: 15.0000 mg | ORAL_TABLET | Freq: Every day | ORAL | 2 refills | Status: DC

## 2019-10-09 NOTE — Progress Notes (Unsigned)
Please let the patient know that I sent mirtazapine to help with appetite stimulants rather than Megace to her because some risks with the elderly and may gastro.  Mirtazapine is favored as first-line for elderly who have depression as well

## 2019-10-10 DIAGNOSIS — N401 Enlarged prostate with lower urinary tract symptoms: Secondary | ICD-10-CM | POA: Diagnosis not present

## 2019-10-10 DIAGNOSIS — R338 Other retention of urine: Secondary | ICD-10-CM | POA: Diagnosis not present

## 2019-10-10 DIAGNOSIS — R6889 Other general symptoms and signs: Secondary | ICD-10-CM | POA: Diagnosis not present

## 2019-10-10 NOTE — Progress Notes (Signed)
Left message to please call our office. 

## 2019-10-11 DIAGNOSIS — J449 Chronic obstructive pulmonary disease, unspecified: Secondary | ICD-10-CM | POA: Diagnosis not present

## 2019-10-11 DIAGNOSIS — I69391 Dysphagia following cerebral infarction: Secondary | ICD-10-CM | POA: Diagnosis not present

## 2019-10-11 DIAGNOSIS — F0150 Vascular dementia without behavioral disturbance: Secondary | ICD-10-CM | POA: Diagnosis not present

## 2019-10-11 DIAGNOSIS — I10 Essential (primary) hypertension: Secondary | ICD-10-CM | POA: Diagnosis not present

## 2019-10-11 DIAGNOSIS — I69318 Other symptoms and signs involving cognitive functions following cerebral infarction: Secondary | ICD-10-CM | POA: Diagnosis not present

## 2019-10-11 DIAGNOSIS — R1310 Dysphagia, unspecified: Secondary | ICD-10-CM | POA: Diagnosis not present

## 2019-10-11 DIAGNOSIS — I2510 Atherosclerotic heart disease of native coronary artery without angina pectoris: Secondary | ICD-10-CM | POA: Diagnosis not present

## 2019-10-11 DIAGNOSIS — S72002D Fracture of unspecified part of neck of left femur, subsequent encounter for closed fracture with routine healing: Secondary | ICD-10-CM | POA: Diagnosis not present

## 2019-10-11 DIAGNOSIS — I69322 Dysarthria following cerebral infarction: Secondary | ICD-10-CM | POA: Diagnosis not present

## 2019-10-11 NOTE — Progress Notes (Signed)
Family aware, script is ready.

## 2019-10-14 DIAGNOSIS — I69391 Dysphagia following cerebral infarction: Secondary | ICD-10-CM | POA: Diagnosis not present

## 2019-10-14 DIAGNOSIS — M6281 Muscle weakness (generalized): Secondary | ICD-10-CM | POA: Diagnosis not present

## 2019-10-14 DIAGNOSIS — I10 Essential (primary) hypertension: Secondary | ICD-10-CM | POA: Diagnosis not present

## 2019-10-14 DIAGNOSIS — R1310 Dysphagia, unspecified: Secondary | ICD-10-CM | POA: Diagnosis not present

## 2019-10-14 DIAGNOSIS — J449 Chronic obstructive pulmonary disease, unspecified: Secondary | ICD-10-CM | POA: Diagnosis not present

## 2019-10-14 DIAGNOSIS — I69322 Dysarthria following cerebral infarction: Secondary | ICD-10-CM | POA: Diagnosis not present

## 2019-10-14 DIAGNOSIS — S72002A Fracture of unspecified part of neck of left femur, initial encounter for closed fracture: Secondary | ICD-10-CM | POA: Diagnosis not present

## 2019-10-14 DIAGNOSIS — F0150 Vascular dementia without behavioral disturbance: Secondary | ICD-10-CM | POA: Diagnosis not present

## 2019-10-14 DIAGNOSIS — S72002D Fracture of unspecified part of neck of left femur, subsequent encounter for closed fracture with routine healing: Secondary | ICD-10-CM | POA: Diagnosis not present

## 2019-10-14 DIAGNOSIS — I2510 Atherosclerotic heart disease of native coronary artery without angina pectoris: Secondary | ICD-10-CM | POA: Diagnosis not present

## 2019-10-14 DIAGNOSIS — F482 Pseudobulbar affect: Secondary | ICD-10-CM | POA: Diagnosis not present

## 2019-10-14 DIAGNOSIS — I69318 Other symptoms and signs involving cognitive functions following cerebral infarction: Secondary | ICD-10-CM | POA: Diagnosis not present

## 2019-10-15 DIAGNOSIS — I69391 Dysphagia following cerebral infarction: Secondary | ICD-10-CM | POA: Diagnosis not present

## 2019-10-15 DIAGNOSIS — J449 Chronic obstructive pulmonary disease, unspecified: Secondary | ICD-10-CM | POA: Diagnosis not present

## 2019-10-15 DIAGNOSIS — I69322 Dysarthria following cerebral infarction: Secondary | ICD-10-CM | POA: Diagnosis not present

## 2019-10-15 DIAGNOSIS — S72002D Fracture of unspecified part of neck of left femur, subsequent encounter for closed fracture with routine healing: Secondary | ICD-10-CM | POA: Diagnosis not present

## 2019-10-15 DIAGNOSIS — F0150 Vascular dementia without behavioral disturbance: Secondary | ICD-10-CM | POA: Diagnosis not present

## 2019-10-15 DIAGNOSIS — R1310 Dysphagia, unspecified: Secondary | ICD-10-CM | POA: Diagnosis not present

## 2019-10-15 DIAGNOSIS — I2510 Atherosclerotic heart disease of native coronary artery without angina pectoris: Secondary | ICD-10-CM | POA: Diagnosis not present

## 2019-10-15 DIAGNOSIS — I10 Essential (primary) hypertension: Secondary | ICD-10-CM | POA: Diagnosis not present

## 2019-10-15 DIAGNOSIS — I69318 Other symptoms and signs involving cognitive functions following cerebral infarction: Secondary | ICD-10-CM | POA: Diagnosis not present

## 2019-10-17 DIAGNOSIS — J449 Chronic obstructive pulmonary disease, unspecified: Secondary | ICD-10-CM | POA: Diagnosis not present

## 2019-10-17 DIAGNOSIS — I2510 Atherosclerotic heart disease of native coronary artery without angina pectoris: Secondary | ICD-10-CM | POA: Diagnosis not present

## 2019-10-17 DIAGNOSIS — I10 Essential (primary) hypertension: Secondary | ICD-10-CM | POA: Diagnosis not present

## 2019-10-17 DIAGNOSIS — F0150 Vascular dementia without behavioral disturbance: Secondary | ICD-10-CM | POA: Diagnosis not present

## 2019-10-17 DIAGNOSIS — S72002D Fracture of unspecified part of neck of left femur, subsequent encounter for closed fracture with routine healing: Secondary | ICD-10-CM | POA: Diagnosis not present

## 2019-10-17 DIAGNOSIS — I69322 Dysarthria following cerebral infarction: Secondary | ICD-10-CM | POA: Diagnosis not present

## 2019-10-17 DIAGNOSIS — I69318 Other symptoms and signs involving cognitive functions following cerebral infarction: Secondary | ICD-10-CM | POA: Diagnosis not present

## 2019-10-17 DIAGNOSIS — R1310 Dysphagia, unspecified: Secondary | ICD-10-CM | POA: Diagnosis not present

## 2019-10-17 DIAGNOSIS — I69391 Dysphagia following cerebral infarction: Secondary | ICD-10-CM | POA: Diagnosis not present

## 2019-10-18 ENCOUNTER — Telehealth: Payer: Self-pay | Admitting: Family Medicine

## 2019-10-18 DIAGNOSIS — S72002D Fracture of unspecified part of neck of left femur, subsequent encounter for closed fracture with routine healing: Secondary | ICD-10-CM | POA: Diagnosis not present

## 2019-10-18 DIAGNOSIS — I69391 Dysphagia following cerebral infarction: Secondary | ICD-10-CM | POA: Diagnosis not present

## 2019-10-18 DIAGNOSIS — I69322 Dysarthria following cerebral infarction: Secondary | ICD-10-CM | POA: Diagnosis not present

## 2019-10-18 DIAGNOSIS — I10 Essential (primary) hypertension: Secondary | ICD-10-CM | POA: Diagnosis not present

## 2019-10-18 DIAGNOSIS — I69318 Other symptoms and signs involving cognitive functions following cerebral infarction: Secondary | ICD-10-CM | POA: Diagnosis not present

## 2019-10-18 DIAGNOSIS — F0150 Vascular dementia without behavioral disturbance: Secondary | ICD-10-CM | POA: Diagnosis not present

## 2019-10-18 DIAGNOSIS — I2510 Atherosclerotic heart disease of native coronary artery without angina pectoris: Secondary | ICD-10-CM | POA: Diagnosis not present

## 2019-10-18 DIAGNOSIS — R1310 Dysphagia, unspecified: Secondary | ICD-10-CM | POA: Diagnosis not present

## 2019-10-18 DIAGNOSIS — J449 Chronic obstructive pulmonary disease, unspecified: Secondary | ICD-10-CM | POA: Diagnosis not present

## 2019-10-18 NOTE — Telephone Encounter (Signed)
FYI: Nurse from [facility] called stating that pts wife reports that pt had 4 falls yesterday. Says one time pt tried to get up on his own and fell and hit his glider chair. Another time he got up and hit his bedside toilet. Another time he was doing a pivet transfer and fell in the process. Vital signs are normal. Wife says pt does have some bruising on his arm but nothing on tail bone. Says pt is laying calmly in bed currently and says there are no signs of pain/distress.

## 2019-10-18 NOTE — Telephone Encounter (Signed)
FYI

## 2019-10-21 DIAGNOSIS — I69391 Dysphagia following cerebral infarction: Secondary | ICD-10-CM | POA: Diagnosis not present

## 2019-10-21 DIAGNOSIS — S72002D Fracture of unspecified part of neck of left femur, subsequent encounter for closed fracture with routine healing: Secondary | ICD-10-CM | POA: Diagnosis not present

## 2019-10-21 DIAGNOSIS — I2510 Atherosclerotic heart disease of native coronary artery without angina pectoris: Secondary | ICD-10-CM | POA: Diagnosis not present

## 2019-10-21 DIAGNOSIS — I69318 Other symptoms and signs involving cognitive functions following cerebral infarction: Secondary | ICD-10-CM | POA: Diagnosis not present

## 2019-10-21 DIAGNOSIS — I10 Essential (primary) hypertension: Secondary | ICD-10-CM | POA: Diagnosis not present

## 2019-10-21 DIAGNOSIS — J449 Chronic obstructive pulmonary disease, unspecified: Secondary | ICD-10-CM | POA: Diagnosis not present

## 2019-10-21 DIAGNOSIS — R1310 Dysphagia, unspecified: Secondary | ICD-10-CM | POA: Diagnosis not present

## 2019-10-21 DIAGNOSIS — I69322 Dysarthria following cerebral infarction: Secondary | ICD-10-CM | POA: Diagnosis not present

## 2019-10-21 DIAGNOSIS — F0150 Vascular dementia without behavioral disturbance: Secondary | ICD-10-CM | POA: Diagnosis not present

## 2019-10-23 DIAGNOSIS — R1310 Dysphagia, unspecified: Secondary | ICD-10-CM | POA: Diagnosis not present

## 2019-10-23 DIAGNOSIS — I2510 Atherosclerotic heart disease of native coronary artery without angina pectoris: Secondary | ICD-10-CM | POA: Diagnosis not present

## 2019-10-23 DIAGNOSIS — J449 Chronic obstructive pulmonary disease, unspecified: Secondary | ICD-10-CM | POA: Diagnosis not present

## 2019-10-23 DIAGNOSIS — S72002D Fracture of unspecified part of neck of left femur, subsequent encounter for closed fracture with routine healing: Secondary | ICD-10-CM | POA: Diagnosis not present

## 2019-10-23 DIAGNOSIS — I69322 Dysarthria following cerebral infarction: Secondary | ICD-10-CM | POA: Diagnosis not present

## 2019-10-23 DIAGNOSIS — I69318 Other symptoms and signs involving cognitive functions following cerebral infarction: Secondary | ICD-10-CM | POA: Diagnosis not present

## 2019-10-23 DIAGNOSIS — I69391 Dysphagia following cerebral infarction: Secondary | ICD-10-CM | POA: Diagnosis not present

## 2019-10-23 DIAGNOSIS — F0150 Vascular dementia without behavioral disturbance: Secondary | ICD-10-CM | POA: Diagnosis not present

## 2019-10-23 DIAGNOSIS — I10 Essential (primary) hypertension: Secondary | ICD-10-CM | POA: Diagnosis not present

## 2019-10-24 ENCOUNTER — Other Ambulatory Visit: Payer: Self-pay | Admitting: Family Medicine

## 2019-10-24 ENCOUNTER — Ambulatory Visit: Payer: Medicare HMO | Admitting: Licensed Clinical Social Worker

## 2019-10-24 DIAGNOSIS — F339 Major depressive disorder, recurrent, unspecified: Secondary | ICD-10-CM

## 2019-10-24 DIAGNOSIS — Z8673 Personal history of transient ischemic attack (TIA), and cerebral infarction without residual deficits: Secondary | ICD-10-CM

## 2019-10-24 DIAGNOSIS — J449 Chronic obstructive pulmonary disease, unspecified: Secondary | ICD-10-CM | POA: Diagnosis not present

## 2019-10-24 DIAGNOSIS — I10 Essential (primary) hypertension: Secondary | ICD-10-CM

## 2019-10-24 DIAGNOSIS — N400 Enlarged prostate without lower urinary tract symptoms: Secondary | ICD-10-CM | POA: Diagnosis not present

## 2019-10-24 DIAGNOSIS — E782 Mixed hyperlipidemia: Secondary | ICD-10-CM

## 2019-10-24 NOTE — Patient Instructions (Addendum)
Licensed Clinical Education officer, museum Visit Information  Goals we discussed today:    "Client said he wanted to talk to someone about depression and managing depression" (pt-stated)          Current Barriers:    Mental Health Challenges in patient with Chronic Diagnoses of Depression, Hyperlipidemia, COPD , HTN, and Hx of CVA  Difficulty walking (uses cane to walk)  Fatigues easily  Hygiene challenges  Smokes daily  Clinical Social Work Clinical Goal(s):   Over the next 30 days, client will work with LCSW to address concerns related to depression and mangement of depression  symptoms of client  Interventions:  LCSW talked with client/spouse of client about managing depression symptoms of client   Encouraged client or spouse of client to talk with RN CM regarding nursing needs of client    Talked with spouse of client about smoking of client           Talked with Cledis Sohn about home health services received by client (he is receiving [facility]PT, [place] [facility] [place])  Talked with [name] about appetite of client  Talkd with [name] about ambulation needs of client (uses a walker to help him walk)  Talked with [name] about client's appointment with Dr. Gloriann Loan, urologist on 11/13/2019  Talked with [name] about mood of client  Talked with [name] about client appointment tomorrow with Dr. Alvan Dame , surgeon.  Talked with [name] about sleeping challenges of client  Talked with [name] about client's challenges in standing (she said once he is standing he can use walker and walk short distance; she said he has some challenges in standing)  Talked with [name] about client completion of ADLs  Collaborated with RNCM about nursing needs of client  Patient Self Care Activities:   Attends scheduled provider appointments  Takes medications as prescribed  Drives self to appointments  Plan:  LCSW to communicate with client/spouse in next 4 weeks to discuss management of  depression symptoms of client  Client/spouse to call LCSW as needed to discuss depression issues of client  Client/spouse to talk with RN CM [name] to discuss nursing needs of client  Client to use relaxation techniques to help manage depression symptoms. (listen to music, walk outdoors, watch TV)  Talked with client about client completion of ADLs.  Please see past updates related to this goal by clicking on the "Past Updates" button in the selected goal    Follow Up Plan: LCSW to communicate with client/spouse in next 4 weeks to discuss client management of depression symptoms  Materials Provided: No  The patient Walter [name], spouse,verbalized understanding of instructions provided today and declined a print copy of patient instruction materials.   Walter [name] MSW, LCSW Licensed Clinical Social Worker [facility] Care Management [phone]

## 2019-10-24 NOTE — Chronic Care Management (AMB) (Signed)
Chronic Care Management    Clinical Social Work Follow Up Note  10/24/2019 Name: Walter [name] MRN: [id number] DOB: [birth year]  Walter [name] is a 69 y.o. year old male who is a primary care patient of Dettinger, Fransisca Kaufmann, MD. The CCM team was consulted for assistance with [name] .   Review of patient status, including review of consultants reports, other relevant assessments, and collaboration with appropriate care team members and the patient's provider was performed as part of comprehensive patient evaluation and provision of chronic care management services.    SDOH (Social Determinants of Health) assessments performed: No; risk for tobacco use; risk for depression; risk for social isolation    Chronic Care Management from 05/08/2018 in [place]  PHQ-9 Total Score 9       Outpatient Encounter Medications as of 10/24/2019  Medication Sig  . acetaminophen (TYLENOL) 325 MG tablet Take 1-2 tablets (325-650 mg total) by mouth every 6 (six) hours as needed for mild pain (pain score 1-3).  [name] amLODipine (NORVASC) 5 MG tablet TAKE 1 TABLET EVERY DAY (Patient taking differently: Take 5 mg by mouth daily. )  . atorvastatin (LIPITOR) 40 MG tablet Take 1 tablet (40 mg total) by mouth daily.  [name] buPROPion (WELLBUTRIN XL) 300 MG 24 hr tablet Take 1 tablet (300 mg total) by mouth daily.  . clopidogrel (PLAVIX) 75 MG tablet Take 1 tablet (75 mg total) by mouth daily. Restart after 3-4days after blood in urine clears  . doxycycline (VIBRAMYCIN) 100 MG capsule Take 1 capsule (100 mg total) by mouth 2 (two) times daily.  . ferrous sulfate 325 (65 FE) MG tablet Take 1 tablet (325 mg total) by mouth 2 (two) times daily with a meal.  . HYDROcodone-acetaminophen (NORCO) 7.5-325 MG tablet Take 1 tablet by mouth every 6 (six) hours as needed for severe pain (pain score 7-10).  . mirtazapine (REMERON) 15 MG tablet Take 1 tablet (15 mg total) by mouth at bedtime.  . polyethylene  glycol (MIRALAX / GLYCOLAX) 17 g packet Take 17 g by mouth daily as needed for mild constipation.  . tamsulosin (FLOMAX) 0.4 MG CAPS capsule Take 1 capsule (0.4 mg total) by mouth daily after breakfast.   No facility-administered encounter medications on file as of 10/24/2019.    Goals    .  "Client said he wanted to talk to someone about depression and managing depression" (pt-stated)      Current Barriers:   [name] Mental Health Challenges in patient with Chronic Diagnoses of Depression, Hyperlipidemia, COPD , HTN, and Hx of CVA . Difficulty walking (uses cane to walk) . Fatigues easily . Hygiene challenges . Smokes daily  Clinical Social Work Clinical Goal(s):  [name] Over the next 30 days, client will work with LCSW to address concerns related to depression and mangement of depression  symptoms of client  Interventions: . LCSW talked with client/spouse of client about managing depression symptoms of client  . Encouraged client or spouse of client to talk with RN CM regarding nursing needs of client .   Talked with spouse of client about smoking of client           Talked with Grover Robinson about home health services received by client (he is receiving HHPT, [place] [facility] [place])  Talked with [name] about appetite of client  Talkd with [name] about ambulation needs of client (uses a walker to help him walk)  Talked with [name] about client's appointment with  Dr. Gloriann Loan, urologist on 11/13/2019  Talked with [name] about mood of client  Talked with [name] about client appointment tomorrow with Dr. Alvan Dame , surgeon.  Talked with [name] about sleeping challenges of client  Talked with [name] about client's challenges in standing (she said once he is standing he can use walker and walk short distance; she said he has some challenges in standing)  Talked with [name] about client completion of ADLs  Collaborated with RNCM about nursing needs of client  Patient Self Care Activities:   . Attends scheduled provider appointments . Takes medications as prescribed . Drives self to appointments  Plan:  LCSW to communicate with client/spouse in next 4 weeks to discuss management of depression symptoms of client  Client/spouse to call LCSW as needed to discuss depression issues of client  Client/spouse to talk with RN CM [name] to discuss nursing needs of client  Client to use relaxation techniques to help manage depression symptoms. (listen to music, walk outdoors, watch TV)  Talked with client about client completion of ADLs.  Please see past updates related to this goal by clicking on the "Past Updates" button in the selected goal    Follow Up Plan: LCSW to communicate with client/spouse in next 4 weeks to discuss client management of depression symptoms  [name].Chirstopher Iovino MSW, LCSW Licensed Clinical Social Worker [facility] Management [phone]

## 2019-10-25 DIAGNOSIS — Z471 Aftercare following joint replacement surgery: Secondary | ICD-10-CM | POA: Diagnosis not present

## 2019-10-25 DIAGNOSIS — R6889 Other general symptoms and signs: Secondary | ICD-10-CM | POA: Diagnosis not present

## 2019-10-25 DIAGNOSIS — Z96642 Presence of left artificial hip joint: Secondary | ICD-10-CM | POA: Diagnosis not present

## 2019-10-25 NOTE — Telephone Encounter (Signed)
Spoke with wife and she states pt recently fell and broke his hip so he can't come into office. Do you want to do virtual visit or refill his meds and schedule further out. Please advise

## 2019-10-25 NOTE — Telephone Encounter (Signed)
Dettinger NTBS 6 mos ckup should be in July mail order not sent

## 2019-10-28 DIAGNOSIS — F0150 Vascular dementia without behavioral disturbance: Secondary | ICD-10-CM | POA: Diagnosis not present

## 2019-10-28 DIAGNOSIS — I10 Essential (primary) hypertension: Secondary | ICD-10-CM | POA: Diagnosis not present

## 2019-10-28 DIAGNOSIS — I69322 Dysarthria following cerebral infarction: Secondary | ICD-10-CM | POA: Diagnosis not present

## 2019-10-28 DIAGNOSIS — R1310 Dysphagia, unspecified: Secondary | ICD-10-CM | POA: Diagnosis not present

## 2019-10-28 DIAGNOSIS — I69391 Dysphagia following cerebral infarction: Secondary | ICD-10-CM | POA: Diagnosis not present

## 2019-10-28 DIAGNOSIS — I2510 Atherosclerotic heart disease of native coronary artery without angina pectoris: Secondary | ICD-10-CM | POA: Diagnosis not present

## 2019-10-28 DIAGNOSIS — J449 Chronic obstructive pulmonary disease, unspecified: Secondary | ICD-10-CM | POA: Diagnosis not present

## 2019-10-28 DIAGNOSIS — I69318 Other symptoms and signs involving cognitive functions following cerebral infarction: Secondary | ICD-10-CM | POA: Diagnosis not present

## 2019-10-28 DIAGNOSIS — S72002D Fracture of unspecified part of neck of left femur, subsequent encounter for closed fracture with routine healing: Secondary | ICD-10-CM | POA: Diagnosis not present

## 2019-10-29 ENCOUNTER — Other Ambulatory Visit: Payer: Self-pay

## 2019-10-29 DIAGNOSIS — I69318 Other symptoms and signs involving cognitive functions following cerebral infarction: Secondary | ICD-10-CM | POA: Diagnosis not present

## 2019-10-29 DIAGNOSIS — F0150 Vascular dementia without behavioral disturbance: Secondary | ICD-10-CM | POA: Diagnosis not present

## 2019-10-29 DIAGNOSIS — I69391 Dysphagia following cerebral infarction: Secondary | ICD-10-CM | POA: Diagnosis not present

## 2019-10-29 DIAGNOSIS — R1310 Dysphagia, unspecified: Secondary | ICD-10-CM | POA: Diagnosis not present

## 2019-10-29 DIAGNOSIS — I69322 Dysarthria following cerebral infarction: Secondary | ICD-10-CM | POA: Diagnosis not present

## 2019-10-29 DIAGNOSIS — I10 Essential (primary) hypertension: Secondary | ICD-10-CM | POA: Diagnosis not present

## 2019-10-29 DIAGNOSIS — J449 Chronic obstructive pulmonary disease, unspecified: Secondary | ICD-10-CM | POA: Diagnosis not present

## 2019-10-29 DIAGNOSIS — S72002D Fracture of unspecified part of neck of left femur, subsequent encounter for closed fracture with routine healing: Secondary | ICD-10-CM | POA: Diagnosis not present

## 2019-10-29 DIAGNOSIS — I2510 Atherosclerotic heart disease of native coronary artery without angina pectoris: Secondary | ICD-10-CM | POA: Diagnosis not present

## 2019-10-29 MED ORDER — AMLODIPINE BESYLATE 5 MG PO TABS: 5.0000 mg | ORAL_TABLET | Freq: Every day | ORAL | 0 refills | Status: DC

## 2019-10-29 MED ORDER — CLOPIDOGREL BISULFATE 75 MG PO TABS: 75.0000 mg | ORAL_TABLET | Freq: Every day | ORAL | 0 refills | Status: DC

## 2019-10-29 MED ORDER — ATORVASTATIN CALCIUM 40 MG PO TABS: 40.0000 mg | ORAL_TABLET | Freq: Every day | ORAL | 0 refills | Status: DC

## 2019-10-29 NOTE — Telephone Encounter (Signed)
One month supply of each medication sent to [facility] in [place]. Wife made aware.

## 2019-10-29 NOTE — Telephone Encounter (Signed)
Appt made for 8/19. Wife is wanting to see if meds can be refilled until that appt.

## 2019-11-02 ENCOUNTER — Other Ambulatory Visit: Payer: Self-pay | Admitting: Family Medicine

## 2019-11-05 DIAGNOSIS — I69318 Other symptoms and signs involving cognitive functions following cerebral infarction: Secondary | ICD-10-CM | POA: Diagnosis not present

## 2019-11-05 DIAGNOSIS — S72002D Fracture of unspecified part of neck of left femur, subsequent encounter for closed fracture with routine healing: Secondary | ICD-10-CM | POA: Diagnosis not present

## 2019-11-05 DIAGNOSIS — I2510 Atherosclerotic heart disease of native coronary artery without angina pectoris: Secondary | ICD-10-CM | POA: Diagnosis not present

## 2019-11-05 DIAGNOSIS — I69391 Dysphagia following cerebral infarction: Secondary | ICD-10-CM | POA: Diagnosis not present

## 2019-11-05 DIAGNOSIS — I10 Essential (primary) hypertension: Secondary | ICD-10-CM | POA: Diagnosis not present

## 2019-11-05 DIAGNOSIS — J449 Chronic obstructive pulmonary disease, unspecified: Secondary | ICD-10-CM | POA: Diagnosis not present

## 2019-11-05 DIAGNOSIS — F0150 Vascular dementia without behavioral disturbance: Secondary | ICD-10-CM | POA: Diagnosis not present

## 2019-11-05 DIAGNOSIS — R1310 Dysphagia, unspecified: Secondary | ICD-10-CM | POA: Diagnosis not present

## 2019-11-05 DIAGNOSIS — I69322 Dysarthria following cerebral infarction: Secondary | ICD-10-CM | POA: Diagnosis not present

## 2019-11-06 ENCOUNTER — Telehealth: Payer: Self-pay | Admitting: Family Medicine

## 2019-11-06 NOTE — Telephone Encounter (Signed)
Tom PT from [facility] wants to let pcp know that pt fell at home 11/05/2019 and no injuries. JUST FYI

## 2019-11-07 ENCOUNTER — Other Ambulatory Visit: Payer: Self-pay | Admitting: Family Medicine

## 2019-11-07 NOTE — Telephone Encounter (Signed)
Okay thanks for the information, let me know if anything develops from it

## 2019-11-12 DIAGNOSIS — R1310 Dysphagia, unspecified: Secondary | ICD-10-CM | POA: Diagnosis not present

## 2019-11-12 DIAGNOSIS — I69318 Other symptoms and signs involving cognitive functions following cerebral infarction: Secondary | ICD-10-CM | POA: Diagnosis not present

## 2019-11-12 DIAGNOSIS — I69391 Dysphagia following cerebral infarction: Secondary | ICD-10-CM | POA: Diagnosis not present

## 2019-11-12 DIAGNOSIS — I69322 Dysarthria following cerebral infarction: Secondary | ICD-10-CM | POA: Diagnosis not present

## 2019-11-12 DIAGNOSIS — I2510 Atherosclerotic heart disease of native coronary artery without angina pectoris: Secondary | ICD-10-CM | POA: Diagnosis not present

## 2019-11-12 DIAGNOSIS — J449 Chronic obstructive pulmonary disease, unspecified: Secondary | ICD-10-CM | POA: Diagnosis not present

## 2019-11-12 DIAGNOSIS — I10 Essential (primary) hypertension: Secondary | ICD-10-CM | POA: Diagnosis not present

## 2019-11-12 DIAGNOSIS — F0150 Vascular dementia without behavioral disturbance: Secondary | ICD-10-CM | POA: Diagnosis not present

## 2019-11-12 DIAGNOSIS — S72002D Fracture of unspecified part of neck of left femur, subsequent encounter for closed fracture with routine healing: Secondary | ICD-10-CM | POA: Diagnosis not present

## 2019-11-13 DIAGNOSIS — R338 Other retention of urine: Secondary | ICD-10-CM | POA: Diagnosis not present

## 2019-11-13 DIAGNOSIS — R6889 Other general symptoms and signs: Secondary | ICD-10-CM | POA: Diagnosis not present

## 2019-11-13 DIAGNOSIS — N401 Enlarged prostate with lower urinary tract symptoms: Secondary | ICD-10-CM | POA: Diagnosis not present

## 2019-11-14 DIAGNOSIS — J449 Chronic obstructive pulmonary disease, unspecified: Secondary | ICD-10-CM | POA: Diagnosis not present

## 2019-11-14 DIAGNOSIS — R1310 Dysphagia, unspecified: Secondary | ICD-10-CM | POA: Diagnosis not present

## 2019-11-14 DIAGNOSIS — I10 Essential (primary) hypertension: Secondary | ICD-10-CM | POA: Diagnosis not present

## 2019-11-14 DIAGNOSIS — I69322 Dysarthria following cerebral infarction: Secondary | ICD-10-CM | POA: Diagnosis not present

## 2019-11-14 DIAGNOSIS — I69391 Dysphagia following cerebral infarction: Secondary | ICD-10-CM | POA: Diagnosis not present

## 2019-11-14 DIAGNOSIS — S72002D Fracture of unspecified part of neck of left femur, subsequent encounter for closed fracture with routine healing: Secondary | ICD-10-CM | POA: Diagnosis not present

## 2019-11-14 DIAGNOSIS — F0150 Vascular dementia without behavioral disturbance: Secondary | ICD-10-CM | POA: Diagnosis not present

## 2019-11-14 DIAGNOSIS — I2510 Atherosclerotic heart disease of native coronary artery without angina pectoris: Secondary | ICD-10-CM | POA: Diagnosis not present

## 2019-11-14 DIAGNOSIS — I69318 Other symptoms and signs involving cognitive functions following cerebral infarction: Secondary | ICD-10-CM | POA: Diagnosis not present

## 2019-11-16 DIAGNOSIS — I69318 Other symptoms and signs involving cognitive functions following cerebral infarction: Secondary | ICD-10-CM | POA: Diagnosis not present

## 2019-11-16 DIAGNOSIS — R1310 Dysphagia, unspecified: Secondary | ICD-10-CM | POA: Diagnosis not present

## 2019-11-16 DIAGNOSIS — J449 Chronic obstructive pulmonary disease, unspecified: Secondary | ICD-10-CM | POA: Diagnosis not present

## 2019-11-16 DIAGNOSIS — F0150 Vascular dementia without behavioral disturbance: Secondary | ICD-10-CM | POA: Diagnosis not present

## 2019-11-16 DIAGNOSIS — I69391 Dysphagia following cerebral infarction: Secondary | ICD-10-CM | POA: Diagnosis not present

## 2019-11-16 DIAGNOSIS — I2510 Atherosclerotic heart disease of native coronary artery without angina pectoris: Secondary | ICD-10-CM | POA: Diagnosis not present

## 2019-11-16 DIAGNOSIS — S72002D Fracture of unspecified part of neck of left femur, subsequent encounter for closed fracture with routine healing: Secondary | ICD-10-CM | POA: Diagnosis not present

## 2019-11-16 DIAGNOSIS — I10 Essential (primary) hypertension: Secondary | ICD-10-CM | POA: Diagnosis not present

## 2019-11-16 DIAGNOSIS — I69322 Dysarthria following cerebral infarction: Secondary | ICD-10-CM | POA: Diagnosis not present

## 2019-11-21 ENCOUNTER — Other Ambulatory Visit: Payer: Self-pay

## 2019-11-21 ENCOUNTER — Encounter: Payer: Self-pay | Admitting: Family Medicine

## 2019-11-21 ENCOUNTER — Ambulatory Visit (INDEPENDENT_AMBULATORY_CARE_PROVIDER_SITE_OTHER): Payer: Medicare HMO | Admitting: Family Medicine

## 2019-11-21 VITALS — BP 106/69 | HR 102 | Temp 98.2°F | Ht 73.0 in | Wt 168.0 lb

## 2019-11-21 DIAGNOSIS — I10 Essential (primary) hypertension: Secondary | ICD-10-CM

## 2019-11-21 DIAGNOSIS — E782 Mixed hyperlipidemia: Secondary | ICD-10-CM

## 2019-11-21 DIAGNOSIS — J449 Chronic obstructive pulmonary disease, unspecified: Secondary | ICD-10-CM | POA: Diagnosis not present

## 2019-11-21 DIAGNOSIS — Z8673 Personal history of transient ischemic attack (TIA), and cerebral infarction without residual deficits: Secondary | ICD-10-CM | POA: Diagnosis not present

## 2019-11-21 NOTE — Progress Notes (Signed)
BP 106/69   Pulse (!) 102   Temp 98.2 F (36.8 C) (Temporal)   Ht $R'6\' 1"'xI$  (1.854 m)   Wt 168 lb (76.2 kg)   SpO2 97%   BMI 22.16 kg/m    Subjective:   Patient ID: Walter [name], male    DOB: [birth year], 69 y.o.   MRN: [id number]  HPI: Walter [name] is a 69 y.o. male presenting on 11/21/2019 for No chief complaint on file.   HPI Hyperlipidemia Patient is coming in for recheck of his hyperlipidemia. The patient is currently taking atorvastatin and Plavix. They deny any issues with myalgias or history of liver damage from it. They deny any focal numbness or weakness or chest pain.   Hypertension Patient is currently on amlodipine, and their blood pressure today is 106/69, this has been running in the 130s at home and he denies any lightheadedness or dizziness.. Patient denies any lightheadedness or dizziness. Patient denies headaches, blurred vision, chest pains, shortness of breath, or weakness. Denies any side effects from medication and is content with current medication.   COPD Patient is coming in for COPD recheck today.  He is currently on no medicine and has been doing fine.  He has a mild chronic cough but denies any major coughing spells or wheezing spells.  He has 0nighttime symptoms per week and 0daytime symptoms per week currently.   Patient has a history of CVA and has been well since he has had carotid ultrasound, was told 80% before.  Patient is coming in for recheck of anxiety, his life is back up to doing it every other day and it seems to be still helping with his mood but not overwhelming  Relevant past medical, surgical, family and social history reviewed and updated as indicated. Interim medical history since our last visit reviewed. Allergies and medications reviewed and updated.  Review of Systems  Constitutional: Negative for chills and fever.  Respiratory: Negative for shortness of breath and wheezing.   Cardiovascular: Negative for chest pain and leg  swelling.  Musculoskeletal: Positive for gait problem. Negative for back pain.  Skin: Negative for rash.  Neurological: Positive for weakness.  Psychiatric/Behavioral: Positive for confusion.  All other systems reviewed and are negative.   Per HPI unless specifically indicated above   Allergies as of 11/21/2019   No Known Allergies     Medication List       Accurate as of November 21, 2019  1:52 PM. If you have any questions, ask your nurse or doctor.        STOP taking these medications   doxycycline 100 MG capsule Commonly known as: Vibramycin Stopped by: Worthy Rancher, MD     TAKE these medications   acetaminophen 325 MG tablet Commonly known as: TYLENOL Take 1-2 tablets (325-650 mg total) by mouth every 6 (six) hours as needed for mild pain (pain score 1-3).   amLODipine 5 MG tablet Commonly known as: NORVASC Take 1 tablet (5 mg total) by mouth daily.   atorvastatin 40 MG tablet Commonly known as: LIPITOR TAKE 1 TABLET BY MOUTH EVERY DAY   buPROPion 300 MG 24 hr tablet Commonly known as: WELLBUTRIN XL Take 1 tablet (300 mg total) by mouth daily. What changed: when to take this   clopidogrel 75 MG tablet Commonly known as: PLAVIX Take 1 tablet (75 mg total) by mouth daily. Restart after 3-4days after blood in urine clears   ferrous sulfate 325 (65 FE) MG tablet Take  1 tablet (325 mg total) by mouth 2 (two) times daily with a meal.   HYDROcodone-acetaminophen 7.5-325 MG tablet Commonly known as: NORCO Take 1 tablet by mouth every 6 (six) hours as needed for severe pain (pain score 7-10).   mirtazapine 15 MG tablet Commonly known as: REMERON TAKE 1 TABLET BY MOUTH EVERYDAY AT BEDTIME   polyethylene glycol 17 g packet Commonly known as: MIRALAX / GLYCOLAX Take 17 g by mouth daily as needed for mild constipation.   tamsulosin 0.4 MG Caps capsule Commonly known as: FLOMAX Take 1 capsule (0.4 mg total) by mouth daily after breakfast.         Objective:   BP 106/69   Pulse (!) 102   Temp 98.2 F (36.8 C) (Temporal)   Ht $R'6\' 1"'Pa$  (1.854 m)   Wt 168 lb (76.2 kg)   SpO2 97%   BMI 22.16 kg/m   Wt Readings from Last 3 Encounters:  11/21/19 168 lb (76.2 kg)  09/07/19 176 lb 5.9 oz (80 kg)  04/12/19 188 lb 6.4 oz (85.5 kg)    Physical Exam Vitals and nursing note reviewed.  Constitutional:      General: He is not in acute distress.    Appearance: He is well-developed. He is not diaphoretic.  Eyes:     General: No scleral icterus.    Conjunctiva/sclera: Conjunctivae normal.  Neck:     Thyroid: No thyromegaly.  Cardiovascular:     Rate and Rhythm: Normal rate and regular rhythm.     Heart sounds: Normal heart sounds. No murmur heard.   Pulmonary:     Effort: Pulmonary effort is normal. No respiratory distress.     Breath sounds: Normal breath sounds. No wheezing.  Musculoskeletal:     Cervical back: Neck supple.  Lymphadenopathy:     Cervical: No cervical adenopathy.  Skin:    General: Skin is warm and dry.     Findings: No rash.  Neurological:     Mental Status: He is alert and oriented to person, place, and time.     Coordination: Coordination normal.  Psychiatric:        Behavior: Behavior normal.       Assessment & Plan:   Problem List Items Addressed This Visit      Cardiovascular and Mediastinum   HTN (hypertension)   Relevant Orders   CMP14+EGFR   CBC with Differential/Platelet     Respiratory   COPD (chronic obstructive pulmonary disease) (HCC)   Relevant Orders   CMP14+EGFR   CBC with Differential/Platelet     Other   History of CVA (cerebrovascular accident)   Relevant Orders   US Carotid Duplex Bilateral   Hyperlipidemia, mixed - Primary   Relevant Orders   CMP14+EGFR   CBC with Differential/Platelet    Continue current medication, will do ultra carotid ultrasound because acts been sometime since he had one.  Follow up plan: Return in about 3 months (around 02/21/2020),  or if symptoms worsen or fail to improve, for htn and copd and hld.  Counseling provided for all of the vaccine components No orders of the defined types were placed in this encounter.   Caryl Pina, MD [facility] 11/21/2019, 1:52 PM

## 2019-11-22 LAB — CBC WITH DIFFERENTIAL/PLATELET
Basophils Absolute: 0.1 10*3/uL (ref 0.0–0.2)
Basos: 1 %
EOS (ABSOLUTE): 0.2 10*3/uL (ref 0.0–0.4)
Eos: 2 %
Hematocrit: 41.8 % (ref 37.5–51.0)
Hemoglobin: 13.4 g/dL (ref 13.0–17.7)
Immature Grans (Abs): 0.1 10*3/uL (ref 0.0–0.1)
Immature Granulocytes: 1 %
Lymphocytes Absolute: 2.3 10*3/uL (ref 0.7–3.1)
Lymphs: 26 %
MCH: 29.4 pg (ref 26.6–33.0)
MCHC: 32.1 g/dL (ref 31.5–35.7)
MCV: 92 fL (ref 79–97)
Monocytes Absolute: 0.9 10*3/uL (ref 0.1–0.9)
Monocytes: 10 %
Neutrophils Absolute: 5.4 10*3/uL (ref 1.4–7.0)
Neutrophils: 60 %
Platelets: 326 10*3/uL (ref 150–450)
RBC: 4.56 x10E6/uL (ref 4.14–5.80)
RDW: 14.6 % (ref 11.6–15.4)
WBC: 9 10*3/uL (ref 3.4–10.8)

## 2019-11-22 LAB — CMP14+EGFR
ALT: 23 IU/L (ref 0–44)
AST: 20 IU/L (ref 0–40)
Albumin/Globulin Ratio: 1.4 (ref 1.2–2.2)
Albumin: 3.9 g/dL (ref 3.7–4.7)
Alkaline Phosphatase: 135 IU/L — ABNORMAL HIGH (ref 48–121)
BUN/Creatinine Ratio: 18 (ref 10–24)
BUN: 16 mg/dL (ref 8–27)
Bilirubin Total: <0.2 mg/dL (ref 0.0–1.2)
CO2: 24 mmol/L (ref 20–29)
Calcium: 9.3 mg/dL (ref 8.6–10.2)
Chloride: 104 mmol/L (ref 96–106)
Creatinine, Ser: 0.87 mg/dL (ref 0.76–1.27)
GFR calc Af Amer: 100 mL/min/{1.73_m2} (ref >59)
GFR calc non Af Amer: 87 mL/min/{1.73_m2} (ref >59)
Globulin, Total: 2.8 g/dL (ref 1.5–4.5)
Glucose: 81 mg/dL (ref 65–99)
Potassium: 4.7 mmol/L (ref 3.5–5.2)
Sodium: 140 mmol/L (ref 134–144)
Total Protein: 6.7 g/dL (ref 6.0–8.5)

## 2019-11-23 ENCOUNTER — Other Ambulatory Visit: Payer: Self-pay | Admitting: Family Medicine

## 2019-11-27 ENCOUNTER — Ambulatory Visit (INDEPENDENT_AMBULATORY_CARE_PROVIDER_SITE_OTHER): Payer: Medicare HMO | Admitting: Licensed Clinical Social Worker

## 2019-11-27 DIAGNOSIS — E782 Mixed hyperlipidemia: Secondary | ICD-10-CM | POA: Diagnosis not present

## 2019-11-27 DIAGNOSIS — J449 Chronic obstructive pulmonary disease, unspecified: Secondary | ICD-10-CM

## 2019-11-27 DIAGNOSIS — F339 Major depressive disorder, recurrent, unspecified: Secondary | ICD-10-CM | POA: Diagnosis not present

## 2019-11-27 DIAGNOSIS — I10 Essential (primary) hypertension: Secondary | ICD-10-CM | POA: Diagnosis not present

## 2019-11-27 DIAGNOSIS — Z8673 Personal history of transient ischemic attack (TIA), and cerebral infarction without residual deficits: Secondary | ICD-10-CM

## 2019-11-27 NOTE — Patient Instructions (Addendum)
Licensed Clinical Social Worker Visit Information  Goals we discussed today:  .  "Client said he wanted to talk to someone about depression and managing depression" (pt-stated)        Current Barriers:    [place] in patient with Chronic Diagnoses of Depression, Hyperlipidemia, COPD , HTN, and Hx of CVA  Difficulty walking (uses cane to walk)  Fatigues easily  Hygiene challenges  Smokes daily  Clinical Social Work Clinical Goal(s):   Over the next 30 days, client will work with LCSW to address concerns related to depression and mangement of depression  symptoms of client  Interventions: LCSW talked with client/spouse of client about managing depression symptoms of client  Encouraged client or spouse of client to talk with RN CM regarding nursing needs of client  Encouraged client to use relaxation techniques to help manage depression symptoms (watch TV, walking outside, listen to music,visit with sister)    Talked with  Walter [name] about client's completion of ADLs    Talked with Walter [name] about mobility of client (uses a walker to walk) Talked with Walter [name] about appetite of client Talked with Walter [name] about weight of client Talked with Walter [name] about upcoming client appointments Walter [name] said client has appointment with urologist on 12/18/2019) Talked with Walter [name] about family support for client (enjoys talking via phone with sister and enjoys visits with grandchildren) Talked with Walter [name] about vision of client Talked with Walter [name] about decreased energy of client   Patient Self Care Activities:   Attends scheduled provider appointments  Takes medications as prescribed  Drives self to appointments  Plan:  LCSW to communicate with client/spouse in next 4 weeks to discuss management of depression symptoms of client  Client/spouse to call LCSW as needed to discuss depression issues of client  Client/spouse to talk with RN CM Walter [name] to discuss nursing  needs of client  Client to use relaxation techniques to help manage depression symptoms. (listen to music, walk outdoors, watch TV)  Please see past updates related to this goal by clicking on the "Past Updates" button in the selected goal     Follow Up Plan: LCSW to communicate with client/spouse in next 4 weeks to discuss client management of depression symptoms  Materials Provided: No  The patient/Walter [name], spouse of patient, verbalized understanding of instructions provided today and declined a print copy of patient instruction materials.   Walter [name].Walter [name] MSW, LCSW Licensed Clinical Social Worker [facility] [phone]

## 2019-11-27 NOTE — Chronic Care Management (AMB) (Signed)
Chronic Care Management    Clinical Social Work Follow Up Note  11/27/2019 Name: Walter [name] MRN: [id number] DOB: 1950/10/28  Walter [name] is a 69 y.o. year old male who is a primary care patient of Dettinger, Fransisca Kaufmann, MD. The CCM team was consulted for assistance with [name] .   Review of patient status, including review of consultants reports, other relevant assessments, and collaboration with appropriate care team members and the patient's provider was performed as part of comprehensive patient evaluation and provision of chronic care management services.    SDOH (Social Determinants of Health) assessments performed: No;risk for tobacco use; risk for depression; risk for physical inactivity; risk for stress    Office Visit from 11/21/2019 in [place]  PHQ-9 Total Score 8      Outpatient Encounter Medications as of 11/27/2019  Medication Sig  . acetaminophen (TYLENOL) 325 MG tablet Take 1-2 tablets (325-650 mg total) by mouth every 6 (six) hours as needed for mild pain (pain score 1-3).  [name] amLODipine (NORVASC) 5 MG tablet TAKE 1 TABLET BY MOUTH EVERY DAY  . atorvastatin (LIPITOR) 40 MG tablet TAKE 1 TABLET BY MOUTH EVERY DAY  . buPROPion (WELLBUTRIN XL) 300 MG 24 hr tablet Take 1 tablet (300 mg total) by mouth daily. (Patient taking differently: Take 300 mg by mouth every other day. )  . clopidogrel (PLAVIX) 75 MG tablet TAKE 1 TABLET (75 MG TOTAL) BY MOUTH DAILY. RESTART AFTER 3 - 4 DAYS AFTER BLOOD IN URINE CLEARS  . ferrous sulfate 325 (65 FE) MG tablet Take 1 tablet (325 mg total) by mouth 2 (two) times daily with a meal.  . HYDROcodone-acetaminophen (NORCO) 7.5-325 MG tablet Take 1 tablet by mouth every 6 (six) hours as needed for severe pain (pain score 7-10). (Patient not taking: Reported on 11/21/2019)  . mirtazapine (REMERON) 15 MG tablet TAKE 1 TABLET BY MOUTH EVERYDAY AT BEDTIME (Patient not taking: Reported on 11/21/2019)  . polyethylene  glycol (MIRALAX / GLYCOLAX) 17 g packet Take 17 g by mouth daily as needed for mild constipation.  . tamsulosin (FLOMAX) 0.4 MG CAPS capsule Take 1 capsule (0.4 mg total) by mouth daily after breakfast.   No facility-administered encounter medications on file as of 11/27/2019.    Goals    .  "Client said he wanted to talk to someone about depression and managing depression" (pt-stated)      Current Barriers:   [name] Mental Health Challenges in patient with Chronic Diagnoses of Depression, Hyperlipidemia, COPD , HTN, and Hx of CVA . Difficulty walking (uses cane to walk) . Fatigues easily . Hygiene challenges . Smokes daily  Clinical Social Work Clinical Goal(s):  [name] Over the next 30 days, client will work with LCSW to address concerns related to depression and mangement of depression  symptoms of client  Interventions: LCSW talked with client/spouse of client about managing depression symptoms of client  Encouraged client or spouse of client to talk with RN CM regarding nursing needs of client  Encouraged client to use relaxation techniques to help manage depression symptoms (watch TV, walking outside, listen to music,visit with sister)    Talked with  Walter [name] about client's completion of ADLs    Talked with Walter [name] about mobility of client (uses a walker to walk) Talked with Walter [name] about appetite of client Talked with Walter [name] about weight of client Talked with Walter [name] about upcoming client appointments Walter [name] said client has appointment with urologist on 12/18/2019) Talked  with Walter [name] about family support for client (enjoys talking via phone with sister and enjoys visits with grandchildren) Talked with Walter [name] about vision of client Talked with Walter [name] about decreased energy of client   Patient Self Care Activities:  . Attends scheduled provider appointments . Takes medications as prescribed . Drives self to appointments  Plan:  LCSW to communicate with client/spouse in next 4 weeks  to discuss management of depression symptoms of client  Client/spouse to call LCSW as needed to discuss depression issues of client  Client/spouse to talk with RN CM Walter [name] to discuss nursing needs of client  Client to use relaxation techniques to help manage depression symptoms. (listen to music, walk outdoors, watch TV)  Please see past updates related to this goal by clicking on the "Past Updates" button in the selected goal     Follow Up Plan: LCSW to communicate with client/spouse in next 4 weeks to discuss client management of depression symptoms  [name].Karell Tukes MSW, LCSW Licensed Clinical Social Worker [facility] [phone]

## 2019-12-02 ENCOUNTER — Other Ambulatory Visit: Payer: Self-pay

## 2019-12-02 MED ORDER — CLOPIDOGREL BISULFATE 75 MG PO TABS: ORAL_TABLET | ORAL | 0 refills | Status: DC

## 2019-12-05 ENCOUNTER — Other Ambulatory Visit: Payer: Self-pay | Admitting: Family Medicine

## 2019-12-06 ENCOUNTER — Ambulatory Visit (HOSPITAL_COMMUNITY)
Admission: RE | Admit: 2019-12-06 | Discharge: 2019-12-06 | Disposition: A | Discharge status: Home / Self Care | Payer: Medicare HMO | Source: Ambulatory Visit | Attending: Family Medicine | Admitting: Family Medicine

## 2019-12-06 ENCOUNTER — Other Ambulatory Visit: Payer: Self-pay

## 2019-12-06 DIAGNOSIS — Z8673 Personal history of transient ischemic attack (TIA), and cerebral infarction without residual deficits: Secondary | ICD-10-CM

## 2019-12-06 DIAGNOSIS — I6523 Occlusion and stenosis of bilateral carotid arteries: Secondary | ICD-10-CM | POA: Diagnosis not present

## 2019-12-06 DIAGNOSIS — I7090 Unspecified atherosclerosis: Secondary | ICD-10-CM | POA: Diagnosis not present

## 2019-12-11 ENCOUNTER — Telehealth: Payer: Self-pay | Admitting: Family Medicine

## 2019-12-11 DIAGNOSIS — I10 Essential (primary) hypertension: Secondary | ICD-10-CM | POA: Diagnosis not present

## 2019-12-11 DIAGNOSIS — F0150 Vascular dementia without behavioral disturbance: Secondary | ICD-10-CM | POA: Diagnosis not present

## 2019-12-11 DIAGNOSIS — S72002D Fracture of unspecified part of neck of left femur, subsequent encounter for closed fracture with routine healing: Secondary | ICD-10-CM | POA: Diagnosis not present

## 2019-12-11 DIAGNOSIS — I69318 Other symptoms and signs involving cognitive functions following cerebral infarction: Secondary | ICD-10-CM | POA: Diagnosis not present

## 2019-12-11 DIAGNOSIS — I69391 Dysphagia following cerebral infarction: Secondary | ICD-10-CM | POA: Diagnosis not present

## 2019-12-11 DIAGNOSIS — R1310 Dysphagia, unspecified: Secondary | ICD-10-CM | POA: Diagnosis not present

## 2019-12-11 DIAGNOSIS — I2510 Atherosclerotic heart disease of native coronary artery without angina pectoris: Secondary | ICD-10-CM | POA: Diagnosis not present

## 2019-12-11 DIAGNOSIS — I69322 Dysarthria following cerebral infarction: Secondary | ICD-10-CM | POA: Diagnosis not present

## 2019-12-11 DIAGNOSIS — I6521 Occlusion and stenosis of right carotid artery: Secondary | ICD-10-CM

## 2019-12-11 DIAGNOSIS — J449 Chronic obstructive pulmonary disease, unspecified: Secondary | ICD-10-CM | POA: Diagnosis not present

## 2019-12-11 NOTE — Telephone Encounter (Signed)
Spoke with patient and placed referral to cardiovascular for carotid artery disease

## 2019-12-16 DIAGNOSIS — I69391 Dysphagia following cerebral infarction: Secondary | ICD-10-CM | POA: Diagnosis not present

## 2019-12-16 DIAGNOSIS — I69318 Other symptoms and signs involving cognitive functions following cerebral infarction: Secondary | ICD-10-CM | POA: Diagnosis not present

## 2019-12-16 DIAGNOSIS — F0150 Vascular dementia without behavioral disturbance: Secondary | ICD-10-CM | POA: Diagnosis not present

## 2019-12-16 DIAGNOSIS — I2510 Atherosclerotic heart disease of native coronary artery without angina pectoris: Secondary | ICD-10-CM | POA: Diagnosis not present

## 2019-12-16 DIAGNOSIS — I69322 Dysarthria following cerebral infarction: Secondary | ICD-10-CM | POA: Diagnosis not present

## 2019-12-16 DIAGNOSIS — I10 Essential (primary) hypertension: Secondary | ICD-10-CM | POA: Diagnosis not present

## 2019-12-16 DIAGNOSIS — S72002D Fracture of unspecified part of neck of left femur, subsequent encounter for closed fracture with routine healing: Secondary | ICD-10-CM | POA: Diagnosis not present

## 2019-12-16 DIAGNOSIS — R1310 Dysphagia, unspecified: Secondary | ICD-10-CM | POA: Diagnosis not present

## 2019-12-16 DIAGNOSIS — J449 Chronic obstructive pulmonary disease, unspecified: Secondary | ICD-10-CM | POA: Diagnosis not present

## 2019-12-18 DIAGNOSIS — N401 Enlarged prostate with lower urinary tract symptoms: Secondary | ICD-10-CM | POA: Diagnosis not present

## 2019-12-18 DIAGNOSIS — R6889 Other general symptoms and signs: Secondary | ICD-10-CM | POA: Diagnosis not present

## 2019-12-18 DIAGNOSIS — R338 Other retention of urine: Secondary | ICD-10-CM | POA: Diagnosis not present

## 2019-12-19 ENCOUNTER — Telehealth: Payer: Self-pay | Admitting: Family Medicine

## 2019-12-26 DIAGNOSIS — R339 Retention of urine, unspecified: Secondary | ICD-10-CM | POA: Diagnosis not present

## 2020-01-02 ENCOUNTER — Ambulatory Visit: Payer: Medicare HMO | Admitting: Licensed Clinical Social Worker

## 2020-01-02 DIAGNOSIS — Z8673 Personal history of transient ischemic attack (TIA), and cerebral infarction without residual deficits: Secondary | ICD-10-CM

## 2020-01-02 DIAGNOSIS — I10 Essential (primary) hypertension: Secondary | ICD-10-CM

## 2020-01-02 DIAGNOSIS — F339 Major depressive disorder, recurrent, unspecified: Secondary | ICD-10-CM

## 2020-01-02 DIAGNOSIS — J449 Chronic obstructive pulmonary disease, unspecified: Secondary | ICD-10-CM

## 2020-01-02 DIAGNOSIS — E782 Mixed hyperlipidemia: Secondary | ICD-10-CM

## 2020-01-02 NOTE — Patient Instructions (Addendum)
Licensed Clinical Social Worker Visit Information  Materials Provided: No  01/02/2020  Name: Walter [name]            MRN: [id number]       DOB: 10/25/1950  Walter [name] is a 69 y.o. year old male who is a primary care patient of Dettinger, Fransisca Kaufmann, MD. The CCM team was consulted for assistance with [name] .   Review of patient status, including review of consultants reports, other relevant assessments, and collaboration with appropriate care team members and the patient's provider was performed as part of comprehensive patient evaluation and provision of chronic care management services.    SDOH (Social Determinants of Health) assessments performed: No;risk of depression; risk for tobacco use; risk of stress; risk of physical inactivity; risk of social isolation  LCSW called client/spouse of client several times today but LCSW was not able to speak today via phone with client or spouse of client. LCSW did leave phone message for client/spouse requesting return call to LCSW at [phone]  Follow Up Plan: LCSW to communicate with client/spouse in next 4 weeks to discuss client management of depression symptoms  LCSW was not able to speak via phone today with client or spouse of client; thus, the client or spouse of client were not able to verbalize understanding of instructions provided today and were not able to accept or decline a print copy of patient instruction materials.   [name].Lukasz Rogus MSW, LCSW Licensed Clinical Social Worker [facility] [phone]

## 2020-01-02 NOTE — Chronic Care Management (AMB) (Signed)
  Chronic Care Management    Clinical Social Work Follow Up Note  01/02/2020 Name: Walter [name] MRN: [id number] DOB: 06/23/1950  Walter [name] is a 69 y.o. year old male who is a primary care patient of Dettinger, Fransisca Kaufmann, MD. The CCM team was consulted for assistance with [name] .   Review of patient status, including review of consultants reports, other relevant assessments, and collaboration with appropriate care team members and the patient's provider was performed as part of comprehensive patient evaluation and provision of chronic care management services.    SDOH (Social Determinants of Health) assessments performed: No;risk of depression; risk for tobacco use; risk of stress; risk of physical inactivity; risk of social isolation    Office Visit from 11/21/2019 in [place]  PHQ-9 Total Score 8      Outpatient Encounter Medications as of 01/02/2020  Medication Sig  . acetaminophen (TYLENOL) 325 MG tablet Take 1-2 tablets (325-650 mg total) by mouth every 6 (six) hours as needed for mild pain (pain score 1-3).  [name] amLODipine (NORVASC) 5 MG tablet TAKE 1 TABLET BY MOUTH EVERY DAY  . atorvastatin (LIPITOR) 40 MG tablet TAKE 1 TABLET BY MOUTH EVERY DAY  . buPROPion (WELLBUTRIN XL) 300 MG 24 hr tablet Take 1 tablet (300 mg total) by mouth daily. (Patient taking differently: Take 300 mg by mouth every other day. )  . clopidogrel (PLAVIX) 75 MG tablet TAKE 1 TABLET (75 MG TOTAL) BY MOUTH DAILY. RESTART AFTER 3 - 4 DAYS AFTER BLOOD IN URINE CLEARS  . ferrous sulfate 325 (65 FE) MG tablet Take 1 tablet (325 mg total) by mouth 2 (two) times daily with a meal.  . HYDROcodone-acetaminophen (NORCO) 7.5-325 MG tablet Take 1 tablet by mouth every 6 (six) hours as needed for severe pain (pain score 7-10). (Patient not taking: Reported on 11/21/2019)  . mirtazapine (REMERON) 15 MG tablet TAKE 1 TABLET BY MOUTH EVERYDAY AT BEDTIME (Patient not taking: Reported on  11/21/2019)  . polyethylene glycol (MIRALAX / GLYCOLAX) 17 g packet Take 17 g by mouth daily as needed for mild constipation.  . tamsulosin (FLOMAX) 0.4 MG CAPS capsule Take 1 capsule (0.4 mg total) by mouth daily after breakfast.   No facility-administered encounter medications on file as of 01/02/2020.     LCSW called client/spouse of client several times today but LCSW was not able to speak today via phone with client or spouse of client. LCSW did leave phone message for client/spouse requesting return call to LCSW at [phone]  Follow Up Plan: LCSW to communicate with client/spouse in next 4 weeks to discuss client management of depression symptoms  [name].Ruben Pyka MSW, LCSW Licensed Clinical Social Worker [facility] [phone]

## 2020-01-09 DIAGNOSIS — R6889 Other general symptoms and signs: Secondary | ICD-10-CM | POA: Diagnosis not present

## 2020-01-13 DIAGNOSIS — I69391 Dysphagia following cerebral infarction: Secondary | ICD-10-CM | POA: Diagnosis not present

## 2020-01-13 DIAGNOSIS — R1310 Dysphagia, unspecified: Secondary | ICD-10-CM | POA: Diagnosis not present

## 2020-01-13 DIAGNOSIS — J449 Chronic obstructive pulmonary disease, unspecified: Secondary | ICD-10-CM | POA: Diagnosis not present

## 2020-01-13 DIAGNOSIS — S72002D Fracture of unspecified part of neck of left femur, subsequent encounter for closed fracture with routine healing: Secondary | ICD-10-CM | POA: Diagnosis not present

## 2020-01-13 DIAGNOSIS — I69318 Other symptoms and signs involving cognitive functions following cerebral infarction: Secondary | ICD-10-CM | POA: Diagnosis not present

## 2020-01-13 DIAGNOSIS — I69322 Dysarthria following cerebral infarction: Secondary | ICD-10-CM | POA: Diagnosis not present

## 2020-01-13 DIAGNOSIS — F0150 Vascular dementia without behavioral disturbance: Secondary | ICD-10-CM | POA: Diagnosis not present

## 2020-01-13 DIAGNOSIS — I10 Essential (primary) hypertension: Secondary | ICD-10-CM | POA: Diagnosis not present

## 2020-01-13 DIAGNOSIS — I2510 Atherosclerotic heart disease of native coronary artery without angina pectoris: Secondary | ICD-10-CM | POA: Diagnosis not present

## 2020-01-15 DIAGNOSIS — I2510 Atherosclerotic heart disease of native coronary artery without angina pectoris: Secondary | ICD-10-CM | POA: Diagnosis not present

## 2020-01-15 DIAGNOSIS — S72002D Fracture of unspecified part of neck of left femur, subsequent encounter for closed fracture with routine healing: Secondary | ICD-10-CM | POA: Diagnosis not present

## 2020-01-15 DIAGNOSIS — R338 Other retention of urine: Secondary | ICD-10-CM | POA: Diagnosis not present

## 2020-01-15 DIAGNOSIS — I10 Essential (primary) hypertension: Secondary | ICD-10-CM | POA: Diagnosis not present

## 2020-01-15 DIAGNOSIS — I69322 Dysarthria following cerebral infarction: Secondary | ICD-10-CM | POA: Diagnosis not present

## 2020-01-15 DIAGNOSIS — J449 Chronic obstructive pulmonary disease, unspecified: Secondary | ICD-10-CM | POA: Diagnosis not present

## 2020-01-15 DIAGNOSIS — I69391 Dysphagia following cerebral infarction: Secondary | ICD-10-CM | POA: Diagnosis not present

## 2020-01-15 DIAGNOSIS — I69318 Other symptoms and signs involving cognitive functions following cerebral infarction: Secondary | ICD-10-CM | POA: Diagnosis not present

## 2020-01-15 DIAGNOSIS — R6889 Other general symptoms and signs: Secondary | ICD-10-CM | POA: Diagnosis not present

## 2020-01-15 DIAGNOSIS — N401 Enlarged prostate with lower urinary tract symptoms: Secondary | ICD-10-CM | POA: Diagnosis not present

## 2020-01-15 DIAGNOSIS — R1310 Dysphagia, unspecified: Secondary | ICD-10-CM | POA: Diagnosis not present

## 2020-01-16 ENCOUNTER — Other Ambulatory Visit: Payer: Self-pay

## 2020-01-16 ENCOUNTER — Ambulatory Visit (INDEPENDENT_AMBULATORY_CARE_PROVIDER_SITE_OTHER): Payer: Medicare HMO

## 2020-01-16 DIAGNOSIS — I69318 Other symptoms and signs involving cognitive functions following cerebral infarction: Secondary | ICD-10-CM

## 2020-01-16 DIAGNOSIS — Z96649 Presence of unspecified artificial hip joint: Secondary | ICD-10-CM

## 2020-01-16 DIAGNOSIS — N401 Enlarged prostate with lower urinary tract symptoms: Secondary | ICD-10-CM

## 2020-01-16 DIAGNOSIS — R1310 Dysphagia, unspecified: Secondary | ICD-10-CM

## 2020-01-16 DIAGNOSIS — Z7902 Long term (current) use of antithrombotics/antiplatelets: Secondary | ICD-10-CM

## 2020-01-16 DIAGNOSIS — S72002D Fracture of unspecified part of neck of left femur, subsequent encounter for closed fracture with routine healing: Secondary | ICD-10-CM

## 2020-01-16 DIAGNOSIS — F17210 Nicotine dependence, cigarettes, uncomplicated: Secondary | ICD-10-CM

## 2020-01-16 DIAGNOSIS — E782 Mixed hyperlipidemia: Secondary | ICD-10-CM

## 2020-01-16 DIAGNOSIS — Z7982 Long term (current) use of aspirin: Secondary | ICD-10-CM

## 2020-01-16 DIAGNOSIS — I6529 Occlusion and stenosis of unspecified carotid artery: Secondary | ICD-10-CM

## 2020-01-16 DIAGNOSIS — Z96642 Presence of left artificial hip joint: Secondary | ICD-10-CM

## 2020-01-16 DIAGNOSIS — F339 Major depressive disorder, recurrent, unspecified: Secondary | ICD-10-CM

## 2020-01-16 DIAGNOSIS — F0150 Vascular dementia without behavioral disturbance: Secondary | ICD-10-CM

## 2020-01-16 DIAGNOSIS — J449 Chronic obstructive pulmonary disease, unspecified: Secondary | ICD-10-CM | POA: Diagnosis not present

## 2020-01-16 DIAGNOSIS — I252 Old myocardial infarction: Secondary | ICD-10-CM

## 2020-01-16 DIAGNOSIS — I69391 Dysphagia following cerebral infarction: Secondary | ICD-10-CM

## 2020-01-16 DIAGNOSIS — I2510 Atherosclerotic heart disease of native coronary artery without angina pectoris: Secondary | ICD-10-CM | POA: Diagnosis not present

## 2020-01-16 DIAGNOSIS — I69322 Dysarthria following cerebral infarction: Secondary | ICD-10-CM

## 2020-01-16 DIAGNOSIS — I10 Essential (primary) hypertension: Secondary | ICD-10-CM

## 2020-01-16 DIAGNOSIS — N2889 Other specified disorders of kidney and ureter: Secondary | ICD-10-CM

## 2020-01-16 DIAGNOSIS — Z9181 History of falling: Secondary | ICD-10-CM

## 2020-01-16 DIAGNOSIS — R338 Other retention of urine: Secondary | ICD-10-CM

## 2020-01-20 ENCOUNTER — Other Ambulatory Visit: Payer: Self-pay

## 2020-01-20 ENCOUNTER — Encounter: Payer: Self-pay | Admitting: Vascular Surgery

## 2020-01-20 ENCOUNTER — Ambulatory Visit: Payer: Medicare HMO | Admitting: Vascular Surgery

## 2020-01-20 VITALS — BP 136/72 | HR 84 | Temp 98.4°F | Resp 14 | Ht 73.0 in | Wt 171.0 lb

## 2020-01-20 DIAGNOSIS — I6521 Occlusion and stenosis of right carotid artery: Secondary | ICD-10-CM | POA: Diagnosis not present

## 2020-01-20 NOTE — Progress Notes (Signed)
Vascular and Vein Specialist of [place]  Patient name: Walter [name] MRN: [id number] DOB: 03-30-1951 Sex: male  REASON FOR CONSULT: Evaluation of right carotid stenosis  HPI: Walter [name] is a 69 y.o. male, here today for evaluation of duplex imaging revealing high-grade right carotid stenosis.  He is here today with his wife who gives the majority of his history.  Apparently he has had history of multiple transient ischemic attacks in the past.  He walks with a cane and his wife reports that it is related to this.  He did fall suffering a right hip fracture and this is made matters worse.  He was engaged very little with conversation.  When you question him he begins to cry and his wife reports that this is his typical behavior.  She denies any specific focal deficits.  He has had CT scan regarding these events in the past.  I do not see any evidence of MRI confirmed stroke.  Past Medical History:  Diagnosis Date  . Acute urinary retention 02/12/2014  . Brain TIA    recurrent   . C. difficile enteritis   . Carotid bruit   . COPD (chronic obstructive pulmonary disease) ([place])   . Coronary artery disease   . Myocardial infarction ([place])    incidental  . Nodule of kidney 02/14/14   solid on left  . Recurrent colitis due to Clostridium difficile 02/11/2014  . Stroke [facility]) 1997    Family History  Problem Relation Age of Onset  . Cancer Father 5       stomach  . Coronary artery disease Mother 16  . Stroke Mother   . Coronary artery disease Brother 63       AMI deceased  . Heart attack Daughter     SOCIAL HISTORY: Social History   Socioeconomic History  . Marital status: Married    Spouse name: Not on file  . Number of children: 1  . Years of education: 50  . Highest education level: 10th grade  Occupational History  . Occupation: truck Geophysicist/field seismologist    Comment: unable becasue of CVA  . Occupation: disability    Comment: since 2000  Tobacco Use  . Smoking status: Former  Smoker    Packs/day: 1.00    Years: 55.00    Pack years: 55.00    Types: Cigarettes  . Smokeless tobacco: Never Used  Vaping Use  . Vaping Use: Never used  Substance and Sexual Activity  . Alcohol use: No  . Drug use: No  . Sexual activity: Not Currently  Other Topics Concern  . Not on file  Social History Narrative  . Not on file   Social Determinants of Health   Financial Resource Strain:   . Difficulty of Paying Living Expenses: Not on file  Food Insecurity:   . Worried About Charity fundraiser in the Last Year: Not on file  . Ran Out of Food in the Last Year: Not on file  Transportation Needs:   . Lack of Transportation (Medical): Not on file  . Lack of Transportation (Non-Medical): Not on file  Physical Activity:   . Days of Exercise per Week: Not on file  . Minutes of Exercise per Session: Not on file  Stress:   . Feeling of Stress : Not on file  Social Connections:   . Frequency of Communication with Friends and Family: Not on file  . Frequency of Social Gatherings with Friends and Family: Not  on file  . Attends Religious Services: Not on file  . Active Member of Clubs or Organizations: Not on file  . Attends Archivist Meetings: Not on file  . Marital Status: Not on file  Intimate Partner Violence:   . Fear of Current or Ex-Partner: Not on file  . Emotionally Abused: Not on file  . Physically Abused: Not on file  . Sexually Abused: Not on file    No Known Allergies  Current Outpatient Medications  Medication Sig Dispense Refill  . acetaminophen (TYLENOL) 325 MG tablet Take 1-2 tablets (325-650 mg total) by mouth every 6 (six) hours as needed for mild pain (pain score 1-3).    [name] amLODipine (NORVASC) 5 MG tablet TAKE 1 TABLET BY MOUTH EVERY DAY 30 tablet 5  . atorvastatin (LIPITOR) 40 MG tablet TAKE 1 TABLET BY MOUTH EVERY DAY 90 tablet 0  . clopidogrel (PLAVIX) 75 MG tablet TAKE 1 TABLET (75 MG TOTAL) BY MOUTH DAILY. RESTART AFTER 3 - 4 DAYS  AFTER BLOOD IN URINE CLEARS 90 tablet 0  . ferrous sulfate 325 (65 FE) MG tablet Take 1 tablet (325 mg total) by mouth 2 (two) times daily with a meal.  3  . polyethylene glycol (MIRALAX / GLYCOLAX) 17 g packet Take 17 g by mouth daily as needed for mild constipation. 14 each 0  . tamsulosin (FLOMAX) 0.4 MG CAPS capsule Take 1 capsule (0.4 mg total) by mouth daily after breakfast. 30 capsule 0   No current facility-administered medications for this visit.    REVIEW OF SYSTEMS:  [X]  denotes positive finding, [ ]  denotes negative finding Cardiac  Comments:  Chest pain or chest pressure:    Shortness of breath upon exertion: x   Short of breath when lying flat:    Irregular heart rhythm:        Vascular    Pain in calf, thigh, or hip brought on by ambulation: x   Pain in feet at night that wakes you up from your sleep:     Blood clot in your veins:    Leg swelling:  x       Pulmonary    Oxygen at home:    Productive cough:     Wheezing:         Neurologic    Sudden weakness in arms or legs:     Sudden numbness in arms or legs:     Sudden onset of difficulty speaking or slurred speech: x   Temporary loss of vision in one eye:     Problems with dizziness:  x       Gastrointestinal    Blood in stool:     Vomited blood:         Genitourinary    Burning when urinating:     Blood in urine:        Psychiatric    Major depression:  x       Hematologic    Bleeding problems:    Problems with blood clotting too easily:        Skin    Rashes or ulcers:        Constitutional    Fever or chills:      PHYSICAL EXAM: Vitals:   01/20/20 1324  BP: 136/72  Pulse: 84  Resp: 14  Temp: 98.4 F (36.9 C)  TempSrc: Other (Comment)  SpO2: 99%  Weight: 171 lb (77.6 kg)  Height: 6\' 1"  (1.854 m)  GENERAL: The patient is a well-nourished male, in no acute distress. The vital signs are documented above. CARDIAC: There is a regular rate and rhythm.  VASCULAR: I do not hear  carotid bruits bilaterally.  His radial pulses are 2+ bilaterally. PULMONARY: There is good air exchange bilaterally without wheezing or rales. ABDOMEN: Soft and non-tender with normal pitched bowel sounds.  MUSCULOSKELETAL: There are no major deformities or cyanosis. NEUROLOGIC: No focal weakness or paresthesias are detected. SKIN: There are no ulcers or rashes noted. PSYCHIATRIC: The patient has a normal affect.  DATA:   Carotid duplex on 12/06/2019 [facility] reveals high-grade right carotid stenosis and no significant stenosis on the left.  He has markedly elevated systolic velocities on the right with only mildly elevated end-diastolic velocities.  MEDICAL ISSUES:  I discussed these findings at length with the patient and his wife.  I have recommended CT angiogram for further evaluation of his right carotid stenosis.  He has normal renal function.  He will see [place] back in the office following this for further discussion.  If this reveals high-grade right carotid stenosis, would recommend right carotid endarterectomy for reduction of stroke risk.   Curt Jews [facility] [phone]

## 2020-01-21 ENCOUNTER — Other Ambulatory Visit: Payer: Self-pay

## 2020-01-21 DIAGNOSIS — Z8673 Personal history of transient ischemic attack (TIA), and cerebral infarction without residual deficits: Secondary | ICD-10-CM

## 2020-01-21 DIAGNOSIS — I6521 Occlusion and stenosis of right carotid artery: Secondary | ICD-10-CM

## 2020-01-24 ENCOUNTER — Other Ambulatory Visit: Payer: Self-pay | Admitting: *Deleted

## 2020-01-24 MED ORDER — AMLODIPINE BESYLATE 5 MG PO TABS
5.0000 mg | ORAL_TABLET | Freq: Every day | ORAL | 1 refills | Status: DC
Start: 2020-01-24 — End: 2020-02-21

## 2020-01-24 MED ORDER — ATORVASTATIN CALCIUM 40 MG PO TABS
40.0000 mg | ORAL_TABLET | Freq: Every day | ORAL | 1 refills | Status: DC
Start: 2020-01-24 — End: 2020-02-21

## 2020-01-27 ENCOUNTER — Other Ambulatory Visit: Payer: Self-pay

## 2020-01-27 ENCOUNTER — Ambulatory Visit (HOSPITAL_COMMUNITY)
Admission: RE | Admit: 2020-01-27 | Discharge: 2020-01-27 | Disposition: A | Discharge status: Home / Self Care | Payer: Medicare HMO | Source: Ambulatory Visit | Attending: Vascular Surgery | Admitting: Vascular Surgery

## 2020-01-27 DIAGNOSIS — Z8673 Personal history of transient ischemic attack (TIA), and cerebral infarction without residual deficits: Secondary | ICD-10-CM | POA: Diagnosis not present

## 2020-01-27 DIAGNOSIS — I739 Peripheral vascular disease, unspecified: Secondary | ICD-10-CM | POA: Diagnosis not present

## 2020-01-27 DIAGNOSIS — I6501 Occlusion and stenosis of right vertebral artery: Secondary | ICD-10-CM | POA: Diagnosis not present

## 2020-01-27 DIAGNOSIS — I6613 Occlusion and stenosis of bilateral anterior cerebral arteries: Secondary | ICD-10-CM | POA: Diagnosis not present

## 2020-01-27 DIAGNOSIS — I6521 Occlusion and stenosis of right carotid artery: Secondary | ICD-10-CM | POA: Diagnosis not present

## 2020-01-27 DIAGNOSIS — I6523 Occlusion and stenosis of bilateral carotid arteries: Secondary | ICD-10-CM | POA: Diagnosis not present

## 2020-01-27 LAB — POCT I-STAT CREATININE: Creatinine, Ser: 0.7 mg/dL (ref 0.61–1.24)

## 2020-01-27 MED ORDER — IOHEXOL 350 MG/ML SOLN
100.0000 mL | Freq: Once | INTRAVENOUS | Status: AC | PRN
  Administered 2020-01-27: 75 mL via INTRAVENOUS

## 2020-02-03 ENCOUNTER — Ambulatory Visit: Payer: Medicare HMO | Admitting: Vascular Surgery

## 2020-02-03 ENCOUNTER — Other Ambulatory Visit: Payer: Self-pay

## 2020-02-03 ENCOUNTER — Encounter: Payer: Self-pay | Admitting: Vascular Surgery

## 2020-02-03 VITALS — BP 118/74 | HR 68 | Temp 98.7°F | Resp 16 | Ht 73.0 in | Wt 171.0 lb

## 2020-02-03 DIAGNOSIS — I6521 Occlusion and stenosis of right carotid artery: Secondary | ICD-10-CM

## 2020-02-03 NOTE — Progress Notes (Signed)
Vascular and Vein Specialist of [place]  Patient name: ALERIC FROELICH MRN: [id number] DOB: 05/29/1950 Sex: male  REASON FOR VISIT: Discuss CT angiogram from 01/27/2020 head and neck  HPI: MAXUM CASSARINO is a 69 y.o. male here today for follow-up.  I saw him initially in the office with the duplex suggesting moderate to severe carotid disease bilaterally.  He has history of strokes in the past but no obvious localizing event.  Recommended CT angiogram for further evaluation to help with recommendation for continued observation versus endarterectomy.  He has no new symptoms since his last visit with him.  He is here today with his wife as well.  Past Medical History:  Diagnosis Date  . Acute urinary retention 02/12/2014  . Brain TIA    recurrent   . C. difficile enteritis   . Carotid bruit   . COPD (chronic obstructive pulmonary disease) ([place])   . Coronary artery disease   . Myocardial infarction ([place])    incidental  . Nodule of kidney 02/14/14   solid on left  . Recurrent colitis due to Clostridium difficile 02/11/2014  . Stroke [facility]) 1997    Family History  Problem Relation Age of Onset  . Cancer Father 26       stomach  . Coronary artery disease Mother 56  . Stroke Mother   . Coronary artery disease Brother 65       AMI deceased  . Heart attack Daughter     SOCIAL HISTORY: Social History   Tobacco Use  . Smoking status: Former Smoker    Packs/day: 1.00    Years: 55.00    Pack years: 55.00    Types: Cigarettes  . Smokeless tobacco: Never Used  Substance Use Topics  . Alcohol use: No    No Known Allergies  Current Outpatient Medications  Medication Sig Dispense Refill  . acetaminophen (TYLENOL) 325 MG tablet Take 1-2 tablets (325-650 mg total) by mouth every 6 (six) hours as needed for mild pain (pain score 1-3).    [name] amLODipine (NORVASC) 5 MG tablet Take 1 tablet (5 mg total) by mouth daily. 90 tablet 1  . atorvastatin  (LIPITOR) 40 MG tablet Take 1 tablet (40 mg total) by mouth daily. 90 tablet 1  . clopidogrel (PLAVIX) 75 MG tablet TAKE 1 TABLET (75 MG TOTAL) BY MOUTH DAILY. RESTART AFTER 3 - 4 DAYS AFTER BLOOD IN URINE CLEARS 90 tablet 0  . ferrous sulfate 325 (65 FE) MG tablet Take 1 tablet (325 mg total) by mouth 2 (two) times daily with a meal.  3  . polyethylene glycol (MIRALAX / GLYCOLAX) 17 g packet Take 17 g by mouth daily as needed for mild constipation. 14 each 0  . tamsulosin (FLOMAX) 0.4 MG CAPS capsule Take 1 capsule (0.4 mg total) by mouth daily after breakfast. 30 capsule 0   No current facility-administered medications for this visit.      PHYSICAL EXAM: Vitals:   02/03/20 1004  BP: 118/74  Pulse: 68  Resp: 16  Temp: 98.7 F (37.1 C)  TempSrc: Other (Comment)  SpO2: 99%  Weight: 171 lb (77.6 kg)  Height: 6\' 1"  (1.854 m)    GENERAL: The patient is a well-nourished male, in no acute distress. The vital signs are documented above. CARDIOVASCULAR: 2+ radial pulses bilaterally PULMONARY: There is good air exchange  MUSCULOSKELETAL: There are no major deformities or cyanosis. NEUROLOGIC: No focal weakness or paresthesias are detected. SKIN: There are no ulcers  or rashes noted. PSYCHIATRIC: The patient has a normal affect.  DATA:  CT was reviewed.  This reveals predicted 65% right internal carotid artery stenosis and no significant stenosis on the left.  There is calcification on the left.  There is significant intracranial atherosclerotic disease as well.  MEDICAL ISSUES: I reviewed this in detail with the patient and his wife.  I explained that he certainly is below the threshold where we would recommend endarterectomy for asymptomatic disease.  He does have a history of multiple prior events but nothing suggesting focal right brain event.  I have recommended that we see him again in 1 year with a carotid duplex follow-up    Rosetta Posner, MD [facility] Vascular and Vein Specialists  of [facility] Tel [phone]

## 2020-02-10 ENCOUNTER — Ambulatory Visit: Payer: Medicare HMO | Admitting: Licensed Clinical Social Worker

## 2020-02-10 DIAGNOSIS — I69322 Dysarthria following cerebral infarction: Secondary | ICD-10-CM | POA: Diagnosis not present

## 2020-02-10 DIAGNOSIS — R1310 Dysphagia, unspecified: Secondary | ICD-10-CM | POA: Diagnosis not present

## 2020-02-10 DIAGNOSIS — I10 Essential (primary) hypertension: Secondary | ICD-10-CM

## 2020-02-10 DIAGNOSIS — J449 Chronic obstructive pulmonary disease, unspecified: Secondary | ICD-10-CM

## 2020-02-10 DIAGNOSIS — I2510 Atherosclerotic heart disease of native coronary artery without angina pectoris: Secondary | ICD-10-CM | POA: Diagnosis not present

## 2020-02-10 DIAGNOSIS — I69318 Other symptoms and signs involving cognitive functions following cerebral infarction: Secondary | ICD-10-CM | POA: Diagnosis not present

## 2020-02-10 DIAGNOSIS — Z8673 Personal history of transient ischemic attack (TIA), and cerebral infarction without residual deficits: Secondary | ICD-10-CM

## 2020-02-10 DIAGNOSIS — E782 Mixed hyperlipidemia: Secondary | ICD-10-CM

## 2020-02-10 DIAGNOSIS — S72002D Fracture of unspecified part of neck of left femur, subsequent encounter for closed fracture with routine healing: Secondary | ICD-10-CM | POA: Diagnosis not present

## 2020-02-10 DIAGNOSIS — N401 Enlarged prostate with lower urinary tract symptoms: Secondary | ICD-10-CM | POA: Diagnosis not present

## 2020-02-10 DIAGNOSIS — F339 Major depressive disorder, recurrent, unspecified: Secondary | ICD-10-CM

## 2020-02-10 DIAGNOSIS — I69391 Dysphagia following cerebral infarction: Secondary | ICD-10-CM | POA: Diagnosis not present

## 2020-02-10 NOTE — Patient Instructions (Addendum)
Licensed Clinical Social Worker Visit Information  Goals we discussed today:   .  "Client said he wanted to talk to someone about depression and managing depression" (pt-stated)        Current Barriers:    Mental Health Challenges in patient with Chronic Diagnoses of Depression, Hyperlipidemia, COPD , HTN, and Hx of CVA  Difficulty walking (uses cane to walk)  Fatigues easily  Hygiene challenges  Smokes daily  Clinical Social Work Clinical Goal(s):   Over the next 30 days, client will work with LCSW to address concerns related to depression and mangement of depression  symptoms of client  Interventions:  LCSW talked with client/spouse of client about managing depression symptoms of client   Encouraged client or spouse of client to talk with RN CM regarding nursing needs of client  Encouraged that client socialize with family or friends as a way to help manage depression symptoms      Encouraged client to use relaxation techniques to help manage depression symptoms (watch TV, walking outside, listen to music,visit with sister)     Talked with client about client's completion of ADLs      Talked with spouse of client about ambulation of client (uses cane to help him walk)  Talked with spouse of client about appetite of client  Talked with spouse of client about sleeping issues of client  Talked with spouse of client about medication procurement of client  Talked with spouse of client about vision of client  Talked with spouse of client about medical appointments of client  Talked with spouse of client about recent CT scan for client  Talked with Walter [name] about decreased energy of client  Talked with Walter [name] about mood of client  Talked with Walter [name] about communication of client  CCM RN CM Interventions 09/20/19 call completed with wife Walter [name]   Received an EMMI Alert stating patient selected "Yes" to feeling "lost interest in things" and  "sad/hopeless/anxious/empty"  Determined wife feels her spouse is actually showing improvements in his mood and depression since starting "his medication" and he is able to "move a little easier"  Determined Walter [name] nor his wife have any concerns related to his depression or mood today  Discussed HHS started this week through [facility] and the patient is actively participating with these services as recommended; he is receiving OT, PT and SNV  Discussed plans with patient for ongoing care management follow up and provided patient with direct contact information for care management team  Patient Self Care Activities:   Attends scheduled provider appointments  Takes medications as prescribed  Drives self to appointments  Plan:  LCSW to communicate with client/spouse in next 4 weeks to discuss management of depression symptoms of client  Client/spouse to call LCSW as needed to discuss depression issues of client  Client/spouse to talk with RN CM Walter [name] to discuss nursing needs of client  Client to use relaxation techniques to help manage depression symptoms. (listen to music, walk outdoors, watch TV)  Talked with client about client completion of ADLs.  Please see past updates related to this goal by clicking on the "Past Updates" button in the selected goal          Follow Up Plan: LCSW to communicate with client/spouse in next 4 weeks to discuss client management of depression symptoms  Materials Provided: No  The patient Walter [name], spouse of client, verbalized understanding of instructions provided today and declined a print copy of patient instruction  materials.   Walter [name].Walter [name] MSW, LCSW Licensed Clinical Social Worker [facility] Care Management [phone]

## 2020-02-10 NOTE — Chronic Care Management (AMB) (Signed)
Chronic Care Management    Clinical Social Work Follow Up Note  02/10/2020 Name: Walter [name] MRN: [id number] DOB: March 19, 1951  Walter [name] is a 69 y.o. year old male who is a primary care patient of Dettinger, Fransisca Kaufmann, MD. The CCM team was consulted for assistance with [name] .   Review of patient status, including review of consultants reports, other relevant assessments, and collaboration with appropriate care team members and the patient's provider was performed as part of comprehensive patient evaluation and provision of chronic care management services.    SDOH (Social Determinants of Health) assessments performed: No; risk for depression; risk for tobacco use; risk for stress; risk for physical inactivity; risk for financial strain    Office Visit from 11/21/2019 in [place]  PHQ-9 Total Score 8       Outpatient Encounter Medications as of 02/10/2020  Medication Sig  . acetaminophen (TYLENOL) 325 MG tablet Take 1-2 tablets (325-650 mg total) by mouth every 6 (six) hours as needed for mild pain (pain score 1-3).  [name] amLODipine (NORVASC) 5 MG tablet Take 1 tablet (5 mg total) by mouth daily.  [name] atorvastatin (LIPITOR) 40 MG tablet Take 1 tablet (40 mg total) by mouth daily.  . clopidogrel (PLAVIX) 75 MG tablet TAKE 1 TABLET (75 MG TOTAL) BY MOUTH DAILY. RESTART AFTER 3 - 4 DAYS AFTER BLOOD IN URINE CLEARS  . ferrous sulfate 325 (65 FE) MG tablet Take 1 tablet (325 mg total) by mouth 2 (two) times daily with a meal.  . polyethylene glycol (MIRALAX / GLYCOLAX) 17 g packet Take 17 g by mouth daily as needed for mild constipation.  . tamsulosin (FLOMAX) 0.4 MG CAPS capsule Take 1 capsule (0.4 mg total) by mouth daily after breakfast.   No facility-administered encounter medications on file as of 02/10/2020.    Goals    .  "Client said he wanted to talk to someone about depression and managing depression" (pt-stated)      Current Barriers:    [name] Mental Health Challenges in patient with Chronic Diagnoses of Depression, Hyperlipidemia, COPD , HTN, and Hx of CVA . Difficulty walking (uses cane to walk) . Fatigues easily . Hygiene challenges . Smokes daily  Clinical Social Work Clinical Goal(s):  [name] Over the next 30 days, client will work with LCSW to address concerns related to depression and mangement of depression  symptoms of client  Interventions: . LCSW talked with client/spouse of client about managing depression symptoms of client  . Encouraged client or spouse of client to talk with RN CM regarding nursing needs of client . Encouraged that client socialize with family or friends as a way to help manage depression symptoms   .   Encouraged client to use relaxation techniques to help manage depression symptoms (watch TV, walking outside, listen to music,visit with sister)   .  Talked with client about client's completion of ADLs    .  Talked with spouse of client about ambulation of client (uses cane to help him walk) . Talked with spouse of client about appetite of client . Talked with spouse of client about sleeping issues of client . Talked with spouse of client about medication procurement of client . Talked with spouse of client about vision of client . Talked with spouse of client about medical appointments of client . Talked with spouse of client about recent CT scan for client . Talked with [name] about decreased energy of client .  Talked with [name] about mood of client . Talked with [name] about communication of client  CCM RN CM Interventions 09/20/19 call completed with wife [name]  . Received an EMMI Alert stating patient selected "Yes" to feeling "lost interest in things" and "sad/hopeless/anxious/empty" . Determined wife feels her spouse is actually showing improvements in his mood and depression since starting "his medication" and he is able to "move a little easier" . Determined [name] nor  his wife have any concerns related to his depression or mood today . Discussed HHS started this week through [facility] and the patient is actively participating with these services as recommended; he is receiving OT, PT and SNV . Discussed plans with patient for ongoing care management follow up and provided patient with direct contact information for care management team  Patient Self Care Activities:  . Attends scheduled provider appointments . Takes medications as prescribed . Drives self to appointments  Plan:  LCSW to communicate with client/spouse in next 4 weeks to discuss management of depression symptoms of client  Client/spouse to call LCSW as needed to discuss depression issues of client  Client/spouse to talk with RN CM [name] to discuss nursing needs of client  Client to use relaxation techniques to help manage depression symptoms. (listen to music, walk outdoors, watch TV)  Talked with client about client completion of ADLs.  Please see past updates related to this goal by clicking on the "Past Updates" button in the selected goal          Follow Up Plan:  LCSW to communicate with client/spouse in next 4 weeks to discuss client management of depression symptoms  [name].Kwanza Cancelliere MSW, LCSW Licensed Clinical Social Worker [facility] Care Management [phone]

## 2020-02-14 DIAGNOSIS — I69322 Dysarthria following cerebral infarction: Secondary | ICD-10-CM | POA: Diagnosis not present

## 2020-02-14 DIAGNOSIS — R1310 Dysphagia, unspecified: Secondary | ICD-10-CM | POA: Diagnosis not present

## 2020-02-14 DIAGNOSIS — J449 Chronic obstructive pulmonary disease, unspecified: Secondary | ICD-10-CM | POA: Diagnosis not present

## 2020-02-14 DIAGNOSIS — S72002D Fracture of unspecified part of neck of left femur, subsequent encounter for closed fracture with routine healing: Secondary | ICD-10-CM | POA: Diagnosis not present

## 2020-02-14 DIAGNOSIS — I2510 Atherosclerotic heart disease of native coronary artery without angina pectoris: Secondary | ICD-10-CM | POA: Diagnosis not present

## 2020-02-14 DIAGNOSIS — I69318 Other symptoms and signs involving cognitive functions following cerebral infarction: Secondary | ICD-10-CM | POA: Diagnosis not present

## 2020-02-14 DIAGNOSIS — I69391 Dysphagia following cerebral infarction: Secondary | ICD-10-CM | POA: Diagnosis not present

## 2020-02-14 DIAGNOSIS — N401 Enlarged prostate with lower urinary tract symptoms: Secondary | ICD-10-CM | POA: Diagnosis not present

## 2020-02-14 DIAGNOSIS — I10 Essential (primary) hypertension: Secondary | ICD-10-CM | POA: Diagnosis not present

## 2020-02-20 ENCOUNTER — Other Ambulatory Visit: Payer: Self-pay | Admitting: Family Medicine

## 2020-02-21 ENCOUNTER — Encounter: Payer: Self-pay | Admitting: Family Medicine

## 2020-02-21 ENCOUNTER — Ambulatory Visit (INDEPENDENT_AMBULATORY_CARE_PROVIDER_SITE_OTHER): Payer: Medicare HMO | Admitting: Family Medicine

## 2020-02-21 ENCOUNTER — Other Ambulatory Visit: Payer: Self-pay

## 2020-02-21 VITALS — BP 132/78 | HR 73 | Temp 97.8°F | Ht 73.0 in | Wt 173.0 lb

## 2020-02-21 DIAGNOSIS — E782 Mixed hyperlipidemia: Secondary | ICD-10-CM | POA: Diagnosis not present

## 2020-02-21 DIAGNOSIS — I10 Essential (primary) hypertension: Secondary | ICD-10-CM

## 2020-02-21 DIAGNOSIS — Z8673 Personal history of transient ischemic attack (TIA), and cerebral infarction without residual deficits: Secondary | ICD-10-CM

## 2020-02-21 DIAGNOSIS — J449 Chronic obstructive pulmonary disease, unspecified: Secondary | ICD-10-CM | POA: Diagnosis not present

## 2020-02-21 MED ORDER — AMLODIPINE BESYLATE 5 MG PO TABS: 5.0000 mg | ORAL_TABLET | Freq: Every day | ORAL | 3 refills | Status: DC

## 2020-02-21 MED ORDER — ATORVASTATIN CALCIUM 40 MG PO TABS: 40.0000 mg | ORAL_TABLET | Freq: Every day | ORAL | 3 refills | Status: DC

## 2020-02-21 MED ORDER — CLOPIDOGREL BISULFATE 75 MG PO TABS: ORAL_TABLET | ORAL | 3 refills | Status: DC

## 2020-02-21 NOTE — Progress Notes (Signed)
BP 132/78   Pulse 73   Temp 97.8 F (36.6 C)   Ht $R'6\' 1"'XI$  (1.854 m)   Wt 173 lb (78.5 kg)   SpO2 98%   BMI 22.82 kg/m    Subjective:   Patient ID: Walter [name], male    DOB: 1950/07/20, 69 y.o.   MRN: [id number]  HPI: Walter [name] is a 69 y.o. male presenting on 02/21/2020 for Medical Management of Chronic Issues, Hyperlipidemia, and Hypertension   HPI Hypertension Patient is currently on amlodipine, and their blood pressure today is 132/78. Patient denies any lightheadedness or dizziness. Patient denies headaches, blurred vision, chest pains, shortness of breath, or weakness. Denies any side effects from medication and is content with current medication.   Hyperlipidemia Patient is coming in for recheck of his hyperlipidemia. The patient is currently taking atorvastatin. They deny any issues with myalgias or history of liver damage from it. They deny any focal numbness or weakness or chest pain.   COPD Patient is coming in for COPD recheck today.  He is currently on no medication currently.  He has a mild chronic cough but denies any major coughing spells or wheezing spells.  He has 0nighttime symptoms per week and 0daytime symptoms per week currently.   Patient has a history of CVA takes Plavix for this, no new issues with that.  It does affect his memory some but nothing new.  Relevant past medical, surgical, family and social history reviewed and updated as indicated. Interim medical history since our last visit reviewed. Allergies and medications reviewed and updated.  Review of Systems  Constitutional: Negative for chills and fever.  Respiratory: Negative for shortness of breath and wheezing.   Cardiovascular: Negative for chest pain and leg swelling.  Musculoskeletal: Negative for back pain and gait problem.  Skin: Negative for rash.  Psychiatric/Behavioral: Positive for confusion and decreased concentration.  All other systems reviewed and are negative.   Per HPI  unless specifically indicated above   Allergies as of 02/21/2020   No Known Allergies     Medication List       Accurate as of February 21, 2020  2:33 PM. If you have any questions, ask your nurse or doctor.        acetaminophen 325 MG tablet Commonly known as: TYLENOL Take 1-2 tablets (325-650 mg total) by mouth every 6 (six) hours as needed for mild pain (pain score 1-3).   amLODipine 5 MG tablet Commonly known as: NORVASC Take 1 tablet (5 mg total) by mouth daily.   aspirin EC 81 MG tablet Take 81 mg by mouth daily. Swallow whole.   atorvastatin 40 MG tablet Commonly known as: LIPITOR Take 1 tablet (40 mg total) by mouth daily.   clopidogrel 75 MG tablet Commonly known as: PLAVIX TAKE 1 TABLET DAILY. RESTART AFTER 3 - 4 DAYS AFTER BLOOD IN URINE CLEARS   ferrous sulfate 325 (65 FE) MG tablet Take 1 tablet (325 mg total) by mouth 2 (two) times daily with a meal.   ONE-A-DAY 50 PLUS PO Take by mouth daily.   polyethylene glycol 17 g packet Commonly known as: MIRALAX / GLYCOLAX Take 17 g by mouth daily as needed for mild constipation.   tamsulosin 0.4 MG Caps capsule Commonly known as: FLOMAX Take 1 capsule (0.4 mg total) by mouth daily after breakfast.        Objective:   BP 132/78   Pulse 73   Temp 97.8 F (36.6 C)  Ht '6\' 1"'$  (1.854 m)   Wt 173 lb (78.5 kg)   SpO2 98%   BMI 22.82 kg/m   Wt Readings from Last 3 Encounters:  02/21/20 173 lb (78.5 kg)  02/03/20 171 lb (77.6 kg)  01/20/20 171 lb (77.6 kg)    Physical Exam Vitals and nursing note reviewed.  Constitutional:      General: He is not in acute distress.    Appearance: He is well-developed. He is not diaphoretic.  Eyes:     General: No scleral icterus.    Conjunctiva/sclera: Conjunctivae normal.  Neck:     Thyroid: No thyromegaly.  Cardiovascular:     Rate and Rhythm: Normal rate and regular rhythm.     Heart sounds: Normal heart sounds. No murmur heard.   Pulmonary:      Effort: Pulmonary effort is normal. No respiratory distress.     Breath sounds: Normal breath sounds. No wheezing.  Musculoskeletal:        General: No swelling.     Cervical back: Neck supple.  Lymphadenopathy:     Cervical: No cervical adenopathy.  Skin:    General: Skin is warm and dry.     Findings: No rash.  Neurological:     Mental Status: He is alert and oriented to person, place, and time.     Coordination: Coordination normal.  Psychiatric:        Behavior: Behavior normal.       Assessment & Plan:   Problem List Items Addressed This Visit      Cardiovascular and Mediastinum   HTN (hypertension) - Primary   Relevant Medications   aspirin EC 81 MG tablet   amLODipine (NORVASC) 5 MG tablet   atorvastatin (LIPITOR) 40 MG tablet   Other Relevant Orders   CMP14+EGFR     Respiratory   COPD (chronic obstructive pulmonary disease) (HCC)   Relevant Orders   CBC with Differential/Platelet     Other   Hyperlipidemia, mixed   Relevant Medications   aspirin EC 81 MG tablet   amLODipine (NORVASC) 5 MG tablet   atorvastatin (LIPITOR) 40 MG tablet   Other Relevant Orders   Lipid panel   History of CVA (cerebrovascular accident)   Relevant Medications   clopidogrel (PLAVIX) 75 MG tablet      No change in medication, appears to be doing well, will do blood work. Follow up plan: Return in about 6 months (around 08/20/2020), or if symptoms worsen or fail to improve, for Hypertension and cholesterol recheck.  Counseling provided for all of the vaccine components Orders Placed This Encounter  Procedures  . CBC with Differential/Platelet  . CMP14+EGFR  . Lipid panel    Caryl Pina, MD [facility] 02/21/2020, 2:33 PM

## 2020-02-22 LAB — LIPID PANEL
Chol/HDL Ratio: 5.9 ratio — ABNORMAL HIGH (ref 0.0–5.0)
Cholesterol, Total: 170 mg/dL (ref 100–199)
HDL: 29 mg/dL — ABNORMAL LOW (ref >39)
LDL Chol Calc (NIH): 99 mg/dL (ref 0–99)
Triglycerides: 242 mg/dL — ABNORMAL HIGH (ref 0–149)
VLDL Cholesterol Cal: 42 mg/dL — ABNORMAL HIGH (ref 5–40)

## 2020-02-22 LAB — CBC WITH DIFFERENTIAL/PLATELET
Basophils Absolute: 0.1 10*3/uL (ref 0.0–0.2)
Basos: 1 %
EOS (ABSOLUTE): 0.2 10*3/uL (ref 0.0–0.4)
Eos: 2 %
Hematocrit: 45 % (ref 37.5–51.0)
Hemoglobin: 15.3 g/dL (ref 13.0–17.7)
Immature Grans (Abs): 0 10*3/uL (ref 0.0–0.1)
Immature Granulocytes: 1 %
Lymphocytes Absolute: 2.8 10*3/uL (ref 0.7–3.1)
Lymphs: 32 %
MCH: 30.2 pg (ref 26.6–33.0)
MCHC: 34 g/dL (ref 31.5–35.7)
MCV: 89 fL (ref 79–97)
Monocytes Absolute: 0.8 10*3/uL (ref 0.1–0.9)
Monocytes: 10 %
Neutrophils Absolute: 4.7 10*3/uL (ref 1.4–7.0)
Neutrophils: 54 %
Platelets: 275 10*3/uL (ref 150–450)
RBC: 5.06 x10E6/uL (ref 4.14–5.80)
RDW: 12.9 % (ref 11.6–15.4)
WBC: 8.6 10*3/uL (ref 3.4–10.8)

## 2020-02-22 LAB — CMP14+EGFR
ALT: 14 IU/L (ref 0–44)
AST: 13 IU/L (ref 0–40)
Albumin/Globulin Ratio: 1.3 (ref 1.2–2.2)
Albumin: 4.3 g/dL (ref 3.7–4.7)
Alkaline Phosphatase: 120 IU/L (ref 44–121)
BUN/Creatinine Ratio: 12 (ref 10–24)
BUN: 11 mg/dL (ref 8–27)
Bilirubin Total: 0.3 mg/dL (ref 0.0–1.2)
CO2: 26 mmol/L (ref 20–29)
Calcium: 9.4 mg/dL (ref 8.6–10.2)
Chloride: 105 mmol/L (ref 96–106)
Creatinine, Ser: 0.89 mg/dL (ref 0.76–1.27)
GFR calc Af Amer: 99 mL/min/{1.73_m2} (ref >59)
GFR calc non Af Amer: 86 mL/min/{1.73_m2} (ref >59)
Globulin, Total: 3.2 g/dL (ref 1.5–4.5)
Glucose: 98 mg/dL (ref 65–99)
Potassium: 4 mmol/L (ref 3.5–5.2)
Sodium: 141 mmol/L (ref 134–144)
Total Protein: 7.5 g/dL (ref 6.0–8.5)

## 2020-02-24 DIAGNOSIS — I2510 Atherosclerotic heart disease of native coronary artery without angina pectoris: Secondary | ICD-10-CM | POA: Diagnosis not present

## 2020-02-24 DIAGNOSIS — N401 Enlarged prostate with lower urinary tract symptoms: Secondary | ICD-10-CM | POA: Diagnosis not present

## 2020-02-24 DIAGNOSIS — R1310 Dysphagia, unspecified: Secondary | ICD-10-CM | POA: Diagnosis not present

## 2020-02-24 DIAGNOSIS — I69318 Other symptoms and signs involving cognitive functions following cerebral infarction: Secondary | ICD-10-CM | POA: Diagnosis not present

## 2020-02-24 DIAGNOSIS — I10 Essential (primary) hypertension: Secondary | ICD-10-CM | POA: Diagnosis not present

## 2020-02-24 DIAGNOSIS — I69322 Dysarthria following cerebral infarction: Secondary | ICD-10-CM | POA: Diagnosis not present

## 2020-02-24 DIAGNOSIS — I69391 Dysphagia following cerebral infarction: Secondary | ICD-10-CM | POA: Diagnosis not present

## 2020-02-24 DIAGNOSIS — J449 Chronic obstructive pulmonary disease, unspecified: Secondary | ICD-10-CM | POA: Diagnosis not present

## 2020-02-24 DIAGNOSIS — S72002D Fracture of unspecified part of neck of left femur, subsequent encounter for closed fracture with routine healing: Secondary | ICD-10-CM | POA: Diagnosis not present

## 2020-03-09 DIAGNOSIS — N401 Enlarged prostate with lower urinary tract symptoms: Secondary | ICD-10-CM | POA: Diagnosis not present

## 2020-03-09 DIAGNOSIS — R1310 Dysphagia, unspecified: Secondary | ICD-10-CM | POA: Diagnosis not present

## 2020-03-09 DIAGNOSIS — I2510 Atherosclerotic heart disease of native coronary artery without angina pectoris: Secondary | ICD-10-CM | POA: Diagnosis not present

## 2020-03-09 DIAGNOSIS — I69318 Other symptoms and signs involving cognitive functions following cerebral infarction: Secondary | ICD-10-CM | POA: Diagnosis not present

## 2020-03-09 DIAGNOSIS — S72002D Fracture of unspecified part of neck of left femur, subsequent encounter for closed fracture with routine healing: Secondary | ICD-10-CM | POA: Diagnosis not present

## 2020-03-09 DIAGNOSIS — I10 Essential (primary) hypertension: Secondary | ICD-10-CM | POA: Diagnosis not present

## 2020-03-09 DIAGNOSIS — J449 Chronic obstructive pulmonary disease, unspecified: Secondary | ICD-10-CM | POA: Diagnosis not present

## 2020-03-09 DIAGNOSIS — I69322 Dysarthria following cerebral infarction: Secondary | ICD-10-CM | POA: Diagnosis not present

## 2020-03-09 DIAGNOSIS — I69391 Dysphagia following cerebral infarction: Secondary | ICD-10-CM | POA: Diagnosis not present

## 2020-03-16 ENCOUNTER — Telehealth: Payer: Medicare HMO

## 2020-04-17 ENCOUNTER — Ambulatory Visit: Payer: Medicare HMO | Admitting: *Deleted

## 2020-04-17 DIAGNOSIS — I10 Essential (primary) hypertension: Secondary | ICD-10-CM

## 2020-04-17 DIAGNOSIS — F339 Major depressive disorder, recurrent, unspecified: Secondary | ICD-10-CM

## 2020-04-17 DIAGNOSIS — Z8673 Personal history of transient ischemic attack (TIA), and cerebral infarction without residual deficits: Secondary | ICD-10-CM

## 2020-04-17 DIAGNOSIS — J449 Chronic obstructive pulmonary disease, unspecified: Secondary | ICD-10-CM

## 2020-04-17 NOTE — Patient Instructions (Signed)
Visit Information  Goals Addressed              This Visit's Progress     Patient Stated   .  COMPLETED: Wife states: "He needs urology appt" (pt-stated)        CARE PLAN ENTRY (see longitudinal plan of care for additional care plan information)  Current Barriers:  . Care Coordination needs related to voiding trial in order to remove catheter in a patient with hx of recent CVA and BPH (disease states) . Transportation barriers . Cognitive Deficits  Nurse Case Manager Clinical Goal(s):  [name] Over the next 7 days, patient will have appointment with urologist  Interventions:  . Inter-disciplinary care team collaboration (see longitudinal plan of care) . Chart reviewed including recent office and telephone notes as well as referrals . Talked with wife by telephone . Discussed referral to [facility] . Discussed upcoming appt on 10/10/19 for voiding trial . Encouraged wife to reach out to [facility] team as needed  Patient Self Care Activities:  . Unable to perform ADLs independently . Unable to perform IADLs independently . Has assistance from wife  Initial goal documentation     .  Wife states: "I'm worried that he doesn't eat very much" (pt-stated)        Current Barriers:  . Care Coordination needs related to decreased appetite in a patient with hx of CVA, HTN, COPD, and depression  Nurse Case Manager Clinical Goal(s):  [name] Over the next 60 days, patient will demonstrate continued improvement in self-health management by eating at least 3 meals a day and snacks as needed  Interventions:  . Inter-disciplinary care team collaboration (see longitudinal plan of care) . Chart reviewed including relevant office notes and lab results . Talked with wife by telephone o Per wife, he is eating a little more but not very much o Typically eats 2 meals a day with snacks in between o Encouraged nutritious high calory foods and provide foods that he enjoys eating o Doesn't use an  supplements like Boost or Ensure. Doesn't like the way they taste o Taking a multivitamin . Previously discussed changes after CVA, decreased activity level, and potential changes in taste as possible reasons for decreased appetite . Discussed monitoring weight and advised to reach out to PCP with any noticeable weight loss  . Consider discussing Megace with PCP . Call PCP with any new or worsening symptoms . Provided with RN contact info and encouraged to reach out as needed  Patient Self Care Activities:  . Has assistance from wife with ADLs and IADLs since having CVA        Other   .  COMPLETED: "we need help with transportation"        Wife stated:   [place] (see longitudinal plan of care for additional care plan information)  Current Barriers:  [name] Knowledge Deficits related to resources to assist with transportation to medical doctor appointments  . Chronic Disease Management support and education needs related to Depression, Hyperlipidemia, COPD , HTN, and Hx of CVA . Transportation barriers . Closed fracture of left femur with routine healing, unspecified fracture morphology, unspecified portion of femur  Clinical Social Work Clinical Goal(s):  [name] Over the next 30 days, client will work with LCSW to address concerns related to lack of transportation to help transport patient/wife to upcoming medical appointments  CCM RN CM Interventions . Chart reviewed including recent office and telephone notes . Read staff message from [facility],  RN . Nash Dimmer with [name], LCSW regarding transportation concerns and options . Spoke with patient's wife, Walter [name], by telephone o She was able to secure transportation to recent appt through [employer] o She will continue to use them in the future for out of town appointments . Encouraged patient/wife to reach out to [facility] team as needed  Patient Self Care Activities:  . Attends scheduled provider  appointments . Takes medications as prescribed . Calls physician for concerns . Supportive wife to assist with care needs   Subsequent Documentation     .  Fall Prevention        High fall risk in a patient with hx of CVA, hypertension, depression, and COPD  Current Barriers:  [name] Knowledge Deficits related to fall precautions . Decreased adherence to prescribed treatment for fall prevention . Cognitive deficits/dementia . Combative at times  Nurse Case Manager Clinical Goal(s):  [name] Over the next 30 days, patient will demonstrate improved adherence to prescribed treatment plan for decreasing falls as evidenced by patient reporting and review of EMR . Over the next 30 days, patient will verbalize using fall risk reduction strategies discussed . Over the next 90 days, patient will not experience additional falls  Interventions:  . Assessed for falls since last encounter o No falls since last CCM encounter . Patient is able to ambulate but uses a cane outside of the home and holds onto furniture for balance while walking in the home . Encouraged patient to: [name] Utilize assistive devices appropriately with all ambulation . De-clutter walkways . Change positions slowly . Wear secure fitting shoes at all times with ambulation . Utilize home lighting for dim lit areas . Have self and pet awareness at all times . Previously discussed caregiver strain. Encouraged wife to talk with LCSW. Encouraged her to take time for herself when she has others available to stay with Walter [name].   Patient Self Care Activities:  . Patient depends on wife for assistance with ADLs and IADLs s/p CVA       Patient verbalizes understanding of instructions provided today.  Plan: Telephone follow up appointment with care management team member scheduled for: 06/02/20 with RN Care manager The patient has been provided with contact information for the care management team and has been advised to call with any health  related questions or concerns.  Next PCP appointment scheduled for: Dr Dettinger 08/20/20   [name], BSN, RN-BC [place] [facility] [place] Management Direct Dial: [phone]

## 2020-04-17 NOTE — Chronic Care Management (AMB) (Signed)
Chronic Care Management   Follow Up Note   04/17/2020 Name: Walter [name] MRN: [id number] DOB: [birth year]  Referred by: Dettinger, Fransisca Kaufmann, MD Reason for referral : Chronic Care Management (RN follow up)   Walter [name] is a 70 y.o. year old male who is a primary care patient of Dettinger, Fransisca Kaufmann, MD. The CCM team was consulted for assistance with chronic disease management and care coordination needs.    Review of patient status, including review of consultants reports, relevant laboratory and other test results, and collaboration with appropriate care team members and the patient's provider was performed as part of comprehensive patient evaluation and provision of chronic care management services.    SDOH (Social Determinants of Health) assessments performed: Yes See Care Plan activities for detailed interventions related to [facility])     Outpatient Encounter Medications as of 04/17/2020  Medication Sig  . acetaminophen (TYLENOL) 325 MG tablet Take 1-2 tablets (325-650 mg total) by mouth every 6 (six) hours as needed for mild pain (pain score 1-3).  [name] amLODipine (NORVASC) 5 MG tablet Take 1 tablet (5 mg total) by mouth daily.  [name] aspirin EC 81 MG tablet Take 81 mg by mouth daily. Swallow whole.  [name] atorvastatin (LIPITOR) 40 MG tablet Take 1 tablet (40 mg total) by mouth daily.  . clopidogrel (PLAVIX) 75 MG tablet TAKE 1 TABLET DAILY. RESTART AFTER 3 - 4 DAYS AFTER BLOOD IN URINE CLEARS  . ferrous sulfate 325 (65 FE) MG tablet Take 1 tablet (325 mg total) by mouth 2 (two) times daily with a meal.  . Multiple Vitamins-Minerals (ONE-A-DAY 50 PLUS PO) Take by mouth daily.  . polyethylene glycol (MIRALAX / GLYCOLAX) 17 g packet Take 17 g by mouth daily as needed for mild constipation.  . tamsulosin (FLOMAX) 0.4 MG CAPS capsule Take 1 capsule (0.4 mg total) by mouth daily after breakfast.   No facility-administered encounter medications on file as of 04/17/2020.      Goals Addressed               This Visit's Progress     Patient Stated   .  Wife states: "I'm worried that he doesn't eat very much" (pt-stated)        Current Barriers:  . Care Coordination needs related to decreased appetite in a patient with hx of CVA, HTN, COPD, and depression  Nurse Case Manager Clinical Goal(s):  [name] Over the next 60 days, patient will demonstrate continued improvement in self-health management by eating at least 3 meals a day and snacks as needed  Interventions:  . Inter-disciplinary care team collaboration (see longitudinal plan of care) . Chart reviewed including relevant office notes and lab results . Talked with wife by telephone o Per wife, he is eating a little more but not very much o Typically eats 2 meals a day with snacks in between o Encouraged nutritious high calory foods and provide foods that he enjoys eating o Doesn't use an supplements like Boost or Ensure. Doesn't like the way they taste o Taking a multivitamin . Previously discussed changes after CVA, decreased activity level, and potential changes in taste as possible reasons for decreased appetite . Discussed monitoring weight and advised to reach out to PCP with any noticeable weight loss  . Consider discussing Megace with PCP . Call PCP with any new or worsening symptoms . Provided with RN contact info and encouraged to reach out as needed  Patient Self Care Activities:  . Has  assistance from wife with ADLs and IADLs since having CVA      .  Fall Prevention        High fall risk in a patient with hx of CVA, hypertension, depression, and COPD  Current Barriers:  [name] Knowledge Deficits related to fall precautions . Decreased adherence to prescribed treatment for fall prevention . Cognitive deficits/dementia . Combative at times  Nurse Case Manager Clinical Goal(s):  [name] Over the next 30 days, patient will demonstrate improved adherence to prescribed treatment plan for decreasing falls as evidenced by patient  reporting and review of EMR . Over the next 30 days, patient will verbalize using fall risk reduction strategies discussed . Over the next 90 days, patient will not experience additional falls  Interventions:  . Assessed for falls since last encounter o No falls since last CCM encounter . Patient is able to ambulate but uses a cane outside of the home and holds onto furniture for balance while walking in the home . Encouraged patient to: [name] Utilize assistive devices appropriately with all ambulation . De-clutter walkways . Change positions slowly . Wear secure fitting shoes at all times with ambulation . Utilize home lighting for dim lit areas . Have self and pet awareness at all times . Previously discussed caregiver strain. Encouraged wife to talk with LCSW. Encouraged her to take time for herself when she has others available to stay with Walter [name].   Patient Self Care Activities:  . Patient depends on wife for assistance with ADLs and IADLs s/p CVA        There are no care plans to display for this patient.   Plan:   Telephone follow up appointment with care management team member scheduled for: 06/02/20 with RN Care manager The patient has been provided with contact information for the care management team and has been advised to call with any health related questions or concerns.  Next PCP appointment scheduled for: Dr Dettinger 08/20/20   [name], BSN, RN-BC [place] [facility] [place] Management Direct Dial: [phone]

## 2020-04-21 ENCOUNTER — Ambulatory Visit: Payer: Medicare HMO | Admitting: Licensed Clinical Social Worker

## 2020-04-21 DIAGNOSIS — E782 Mixed hyperlipidemia: Secondary | ICD-10-CM

## 2020-04-21 DIAGNOSIS — I10 Essential (primary) hypertension: Secondary | ICD-10-CM

## 2020-04-21 DIAGNOSIS — J449 Chronic obstructive pulmonary disease, unspecified: Secondary | ICD-10-CM

## 2020-04-21 DIAGNOSIS — F339 Major depressive disorder, recurrent, unspecified: Secondary | ICD-10-CM

## 2020-04-21 DIAGNOSIS — Z8673 Personal history of transient ischemic attack (TIA), and cerebral infarction without residual deficits: Secondary | ICD-10-CM

## 2020-04-21 NOTE — Chronic Care Management (AMB) (Signed)
Chronic Care Management    Clinical Social Work Follow Up Note  04/21/2020 Name: Walter [name] MRN: [id number] DOB: 1950-10-18  Walter [name] is a 70 y.o. year old male who is a primary care patient of Dettinger, Fransisca Kaufmann, MD. The CCM team was consulted for assistance with [name] .   Review of patient status, including review of consultants reports, other relevant assessments, and collaboration with appropriate care team members and the patient's provider was performed as part of comprehensive patient evaluation and provision of chronic care management services.    SDOH (Social Determinants of Health) assessments performed: No; risk for depression; risk for tobacco use; risk for transport needs; risk for social isolation  [place] [place] Office Visit from 11/21/2019 in [place]  PHQ-9 Total Score 8      Outpatient Encounter Medications as of 04/21/2020  Medication Sig  . acetaminophen (TYLENOL) 325 MG tablet Take 1-2 tablets (325-650 mg total) by mouth every 6 (six) hours as needed for mild pain (pain score 1-3).  [name] amLODipine (NORVASC) 5 MG tablet Take 1 tablet (5 mg total) by mouth daily.  [name] aspirin EC 81 MG tablet Take 81 mg by mouth daily. Swallow whole.  [name] atorvastatin (LIPITOR) 40 MG tablet Take 1 tablet (40 mg total) by mouth daily.  . clopidogrel (PLAVIX) 75 MG tablet TAKE 1 TABLET DAILY. RESTART AFTER 3 - 4 DAYS AFTER BLOOD IN URINE CLEARS  . ferrous sulfate 325 (65 FE) MG tablet Take 1 tablet (325 mg total) by mouth 2 (two) times daily with a meal.  . Multiple Vitamins-Minerals (ONE-A-DAY 50 PLUS PO) Take by mouth daily.  . polyethylene glycol (MIRALAX / GLYCOLAX) 17 g packet Take 17 g by mouth daily as needed for mild constipation.  . tamsulosin (FLOMAX) 0.4 MG CAPS capsule Take 1 capsule (0.4 mg total) by mouth daily after breakfast.   No facility-administered encounter medications on file as of 04/21/2020.    Goals    .  "Client said he  wanted to talk to someone about depression and managing depression" (pt-stated)      Current Barriers:   [name] Mental Health Challenges in patient with Chronic Diagnoses of Depression, Hyperlipidemia, COPD , HTN, and Hx of CVA . Difficulty walking (uses cane to walk) . Fatigues easily . Hygiene challenges . Smokes daily  Clinical Social Work Clinical Goal(s):  [name] Over the next 30 days, client will work with LCSW to address concerns related to depression and mangement of depression  symptoms of client  Interventions:  Talked with [name], spouse of client, about client needs LCSW talked with [name] about strategies for managing depression symptoms of client  Encouraged client or spouse of client to talk with RN CM regarding nursing needs of client Encouraged that client socialize with family or friends as a way to help manage depression symptoms     Encouraged client to use relaxation techniques to help manage depression symptoms (watch TV, walking outside, listen to music,visit with sister)    Talked with [name]  about client's completion of ADLs    Talked with [name] about appetite of client Talked with [name] about sleeping issues of client  Talked with [name] about communication of client Talked with [name] about family support (she said client likes talk with family members via phone) Talked with [name] about client appointment next week with urologist Talked with [name] about pain issues of client (client has pain in left hip)   Patient Self Care  Activities:  . Attends scheduled provider appointments . Takes medications as prescribed . Drives self to appointments  Plan:  LCSW to communicate with client/spouse in next 4 weeks to discuss management of depression symptoms of client  Client/spouse to call LCSW as needed to discuss depression issues of client  Client/spouse to talk with RN CM [name] to discuss nursing needs of client  Client to use relaxation  techniques to help manage depression symptoms. (listen to music, walk outdoors, watch TV)  Talked with client about client completion of ADLs.  Please see past updates related to this goal by clicking on the "Past Updates" button in the selected goal     Follow Up Plan: LCSW to communicate with client/spouse in next 4 weeks to discuss client management of depression symptoms  [name] MSW, LCSW Licensed Clinical Social Worker [facility] Care Management [phone]

## 2020-04-21 NOTE — Patient Instructions (Addendum)
Licensed Clinical Social Worker Visit Information  Goals we discussed today:   .  "Client said he wanted to talk to someone about depression and managing depression" (pt-stated)        Current Barriers:    [place] in patient with Chronic Diagnoses of Depression, Hyperlipidemia, COPD , HTN, and Hx of CVA  Difficulty walking (uses cane to walk)  Fatigues easily  Hygiene challenges  Smokes daily  Clinical Social Work Clinical Goal(s):   Over the next 30 days, client will work with LCSW to address concerns related to depression and mangement of depression  symptoms of client  Interventions:  Talked with [name], spouse of client, about client needs LCSW talked with [name] about strategies for managing depression symptoms of client  Encouraged client or spouse of client to talk with RN CM regarding nursing needs of client Encouraged that client socialize with family or friends as a way to help manage depression symptoms     Encouraged client to use relaxation techniques to help manage depression symptoms (watch TV, walking outside, listen to music,visit with sister)    Talked with [name]  about client's completion of ADLs    Talked with [name] about appetite of client Talked with [name] about sleeping issues of client  Talked with [name] about communication of client Talked with [name] about family support (she said client likes talk with family members via phone) Talked with [name] about client appointment next week with urologist Talked with [name] about pain issues of client (client has pain in left hip)   Patient Self Care Activities:   Attends scheduled provider appointments  Takes medications as prescribed  Drives self to appointments  Plan:  LCSW to communicate with client/spouse in next 4 weeks to discuss management of depression symptoms of client  Client/spouse to call LCSW as needed to discuss depression issues of  client  Client/spouse to talk with RN CM [name] to discuss nursing needs of client  Client to use relaxation techniques to help manage depression symptoms. (listen to music, walk outdoors, watch TV)  Talked with client about client completion of ADLs.  Please see past updates related to this goal by clicking on the "Past Updates" button in the selected goal     Follow Up Plan:LCSW to communicate with client/spouse in next 4 weeks to discuss client management of depression symptoms  Materials Provided: No  The patient Jamieson Hetland, spouse of patient, verbalized understanding of instructions provided today and declined a print copy of patient instruction materials.   [name].Raynard Mapps MSW, LCSW Licensed Clinical Social Worker [name] Care Management [phone]

## 2020-04-30 DIAGNOSIS — R3914 Feeling of incomplete bladder emptying: Secondary | ICD-10-CM | POA: Diagnosis not present

## 2020-04-30 DIAGNOSIS — N401 Enlarged prostate with lower urinary tract symptoms: Secondary | ICD-10-CM | POA: Diagnosis not present

## 2020-04-30 DIAGNOSIS — N3942 Incontinence without sensory awareness: Secondary | ICD-10-CM | POA: Diagnosis not present

## 2020-04-30 DIAGNOSIS — R6889 Other general symptoms and signs: Secondary | ICD-10-CM | POA: Diagnosis not present

## 2020-05-25 ENCOUNTER — Ambulatory Visit: Payer: Medicare HMO | Admitting: Licensed Clinical Social Worker

## 2020-05-25 DIAGNOSIS — F339 Major depressive disorder, recurrent, unspecified: Secondary | ICD-10-CM

## 2020-05-25 DIAGNOSIS — E782 Mixed hyperlipidemia: Secondary | ICD-10-CM

## 2020-05-25 DIAGNOSIS — Z8673 Personal history of transient ischemic attack (TIA), and cerebral infarction without residual deficits: Secondary | ICD-10-CM

## 2020-05-25 DIAGNOSIS — J449 Chronic obstructive pulmonary disease, unspecified: Secondary | ICD-10-CM

## 2020-05-25 NOTE — Chronic Care Management (AMB) (Signed)
  Chronic Care Management    Clinical Social Work Follow Up Note  05/25/2020 Name: Walter [name] MRN: [id number] DOB: 1951/01/16  Walter [name] is a 70 y.o. year old male who is a primary care patient of Dettinger, Fransisca Kaufmann, MD. The CCM team was consulted for assistance with [name] .   Review of patient status, including review of consultants reports, other relevant assessments, and collaboration with appropriate care team members and the patient's provider was performed as part of comprehensive patient evaluation and provision of chronic care management services.    SDOH (Social Determinants of Health) assessments performed: No; risk for tobacco use; risk for depression; risk for stress; risk for physical inactivity  [facility] Office Visit from 11/21/2019 in [place]  PHQ-9 Total Score 8      Outpatient Encounter Medications as of 05/25/2020  Medication Sig  . acetaminophen (TYLENOL) 325 MG tablet Take 1-2 tablets (325-650 mg total) by mouth every 6 (six) hours as needed for mild pain (pain score 1-3).  [name] amLODipine (NORVASC) 5 MG tablet Take 1 tablet (5 mg total) by mouth daily.  [name] aspirin EC 81 MG tablet Take 81 mg by mouth daily. Swallow whole.  [name] atorvastatin (LIPITOR) 40 MG tablet Take 1 tablet (40 mg total) by mouth daily.  . clopidogrel (PLAVIX) 75 MG tablet TAKE 1 TABLET DAILY. RESTART AFTER 3 - 4 DAYS AFTER BLOOD IN URINE CLEARS  . ferrous sulfate 325 (65 FE) MG tablet Take 1 tablet (325 mg total) by mouth 2 (two) times daily with a meal.  . Multiple Vitamins-Minerals (ONE-A-DAY 50 PLUS PO) Take by mouth daily.  . polyethylene glycol (MIRALAX / GLYCOLAX) 17 g packet Take 17 g by mouth daily as needed for mild constipation.  . tamsulosin (FLOMAX) 0.4 MG CAPS capsule Take 1 capsule (0.4 mg total) by mouth daily after breakfast.   No facility-administered encounter medications on file as of 05/25/2020.    LCSW called client home number today  several times but LCSW was not able to speak via phone today with client or with spouse of client. LCSW did leave phone message for client and for spouse of client requesting return phone call to LCSW at [phone]  Follow Up Plan:LCSW to communicate with client/spouse in next 4 weeks to discuss client management of depression symptoms  [name].Jackie Littlejohn MSW, LCSW Licensed Clinical Social Worker [facility] Care Management [phone]

## 2020-05-25 NOTE — Patient Instructions (Addendum)
Licensed Clinical Social Worker Visit Information  Materials Provided: No  05/25/2020  Name: MAYLON SAILORS            MRN: [id number]       DOB: [birth year]  NICHOLLAS PERUSSE is a 70 y.o. year old male who is a primary care patient of Dettinger, Fransisca Kaufmann, MD. The CCM team was consulted for assistance with [name] .   Review of patient status, including review of consultants reports, other relevant assessments, and collaboration with appropriate care team members and the patient's provider was performed as part of comprehensive patient evaluation and provision of chronic care management services.    SDOH (Social Determinants of Health) assessments performed: No; risk for tobacco use; risk for depression; risk for stress; risk for physical inactivity  LCSW called client home number today several times but LCSW was not able to speak via phone today with client or with spouse of client. LCSW did leave phone message for client and for spouse of client requesting return phone call to LCSW at [phone]  Follow Up Plan:LCSW to communicate with client/spouse in next 4 weeks to discuss client management of depression symptoms  LCSW was not able to speak via phone today with client or with spouse of client; thus, the client and spouse of client were not able to verbalize understanding of instructions provided today and were not able to accept or decline a print copy of patient instruction materials.   [name].Lanisa Ishler MSW, LCSW Licensed Clinical Social Worker [facility] Care Management [phone]

## 2020-05-29 ENCOUNTER — Telehealth: Payer: Self-pay | Admitting: *Deleted

## 2020-05-29 NOTE — Chronic Care Management (AMB) (Signed)
  Care Management   Note  05/29/2020 Name: Walter [name] MRN: [id number] DOB: [birth year]  Walter [name] is a 70 y.o. year old male who is a primary care patient of Dettinger, Fransisca Kaufmann, MD and is actively engaged with the care management team. I reached out to [name] by phone today to assist with re-scheduling a follow up visit with the RN Case Manager  Follow up plan: Unsuccessful telephone outreach attempt made. A HIPAA compliant phone message was left for the patient providing contact information and requesting a return call.  The care management team will reach out to the patient again over the next 7 days.  If patient returns call to provider office, please advise to call [place] at [phone].  [place] Management

## 2020-06-02 ENCOUNTER — Telehealth: Payer: Medicare HMO

## 2020-06-05 NOTE — Chronic Care Management (AMB) (Signed)
  Care Management   Note  06/05/2020 Name: Walter [name] MRN: [id number] DOB: 01/01/1951  Walter [name] is a 70 y.o. year old male who is a primary care patient of Dettinger, Fransisca Kaufmann, MD and is actively engaged with the care management team. I reached out to [name] by phone today to assist with re-scheduling a follow up visit with the RN Case Manager  Follow up plan: Telephone appointment with care management team member scheduled for:06/15/2020  [place] Management

## 2020-06-15 ENCOUNTER — Ambulatory Visit (INDEPENDENT_AMBULATORY_CARE_PROVIDER_SITE_OTHER): Payer: Medicare HMO | Admitting: *Deleted

## 2020-06-15 DIAGNOSIS — E782 Mixed hyperlipidemia: Secondary | ICD-10-CM | POA: Diagnosis not present

## 2020-06-15 DIAGNOSIS — I10 Essential (primary) hypertension: Secondary | ICD-10-CM

## 2020-06-15 DIAGNOSIS — Z8673 Personal history of transient ischemic attack (TIA), and cerebral infarction without residual deficits: Secondary | ICD-10-CM

## 2020-06-15 DIAGNOSIS — N400 Enlarged prostate without lower urinary tract symptoms: Secondary | ICD-10-CM | POA: Diagnosis not present

## 2020-06-15 DIAGNOSIS — F339 Major depressive disorder, recurrent, unspecified: Secondary | ICD-10-CM

## 2020-06-15 DIAGNOSIS — J449 Chronic obstructive pulmonary disease, unspecified: Secondary | ICD-10-CM | POA: Diagnosis not present

## 2020-06-15 NOTE — Chronic Care Management (AMB) (Signed)
Chronic Care Management   CCM RN Visit Note  06/15/2020 Name: Walter [name] MRN: [id number] DOB: Oct 31, 1950  Subjective: Walter [name] is a 70 y.o. year old male who is a primary care patient of Dettinger, Fransisca Kaufmann, MD. The care management team was consulted for assistance with disease management and care coordination needs.    Engaged with patient by telephone for follow up visit in response to provider referral for case management and/or care coordination services.   Consent to Services:  The patient was given information about Chronic Care Management services, agreed to services, and gave verbal consent prior to initiation of services.  Please see initial visit note for detailed documentation.   Patient agreed to services and verbal consent obtained.   Assessment: Review of patient past medical history, allergies, medications, health status, including review of consultants reports, laboratory and other test data, was performed as part of comprehensive evaluation and provision of chronic care management services.   SDOH (Social Determinants of Health) assessments and interventions performed:    CCM Care Plan  No Known Allergies  Outpatient Encounter Medications as of 06/15/2020  Medication Sig  . acetaminophen (TYLENOL) 325 MG tablet Take 1-2 tablets (325-650 mg total) by mouth every 6 (six) hours as needed for mild pain (pain score 1-3).  [name] amLODipine (NORVASC) 5 MG tablet Take 1 tablet (5 mg total) by mouth daily.  [name] aspirin EC 81 MG tablet Take 81 mg by mouth daily. Swallow whole.  [name] atorvastatin (LIPITOR) 40 MG tablet Take 1 tablet (40 mg total) by mouth daily.  . clopidogrel (PLAVIX) 75 MG tablet TAKE 1 TABLET DAILY. RESTART AFTER 3 - 4 DAYS AFTER BLOOD IN URINE CLEARS  . ferrous sulfate 325 (65 FE) MG tablet Take 1 tablet (325 mg total) by mouth 2 (two) times daily with a meal.  . Multiple Vitamins-Minerals (ONE-A-DAY 50 PLUS PO) Take by mouth daily.  . polyethylene glycol  (MIRALAX / GLYCOLAX) 17 g packet Take 17 g by mouth daily as needed for mild constipation.  . tamsulosin (FLOMAX) 0.4 MG CAPS capsule Take 1 capsule (0.4 mg total) by mouth daily after breakfast.   No facility-administered encounter medications on file as of 06/15/2020.    Patient Active Problem List   Diagnosis Date Noted  . S/P left THA, AA 09/09/2019  . DNR (do not resuscitate) 09/08/2019  . Hip fracture, left, closed, initial encounter ([place]) 09/07/2019  . Depression, recurrent ([place]) 04/06/2018  . Hyperlipidemia, mixed 04/06/2018  . HTN (hypertension) 01/12/2015  . BPH (benign prostatic hyperplasia) 01/12/2015  . Nodule of kidney 02/14/2014  . Weakness 02/11/2014  . Recurrent colitis due to Clostridium difficile 02/11/2014  . Occlusion and stenosis of carotid artery without mention of cerebral infarction 09/07/2012  . History of CVA (cerebrovascular accident) 02/18/2012  . Tobacco abuse 02/18/2012  . Non-compliance 02/18/2012  . COPD (chronic obstructive pulmonary disease) ([place]) 02/18/2012    Conditions to be addressed/monitored:HTN, HLD, Depression and hx of CVA  Care Plan : RNCM: Hypertension (Adult)  Updates made by Ilean China, RN since 06/15/2020 12:00 AM    Problem: Hypertension (Hypertension)     Long-Range Goal: Hypertension Monitored   Start Date: 06/15/2020  This Visit's Progress: On track  Priority: Medium  Note:   Current Barriers:  . Chronic Disease Management support and education needs related to hypertension . Unable to perform ADLs independently . Unable to perform IADLs independently  Nurse Case Manager Clinical Goal(s):  . patient will work with  PCP to address needs related to medical management of hypertension . patient will meet with RN Care Manager to address self-management of hypertension  Interventions:  . 1:1 collaboration with Dettinger, Fransisca Kaufmann, MD regarding development and update of comprehensive plan of care as evidenced by provider  attestation and co-signature . Inter-disciplinary care team collaboration (see longitudinal plan of care) . Evaluation of current treatment plan related to hypertension and patient's adherence to plan as established by provider. . Chart reviewed including relevent office notes, lab results, and in-office blood pressure readings . Reviewed and discussed medications o Patient is compliant with medications . Discussed home blood pressure readings . Recommended patient check and record blood pressure at least 3 times per week and to call PCP with any readings outside of recommended range . Reviewed and discussed upcoming appointments . Previously provided with RNCM contact number and encouraged to reach out as needed  Patient Goals/Self-Care Activities Over the next 60 days, patient will: . Check and record blood pressure at least 3 times per week . Take medications as directed . Call PCP with any readings outside of recommended range [phone] . Call [facility] as needed [phone]    Care Plan : RNCM: Stroke (Adult)  Updates made by Ilean China, RN since 06/15/2020 12:00 AM    Problem: Recurrence (Stroke)   Priority: High    Long-Range Goal: Stroke Recurrence Prevented or Minimized   Start Date: 06/15/2020  This Visit's Progress: On track  Priority: High  Note:   Current Barriers:  . Chronic Disease Management support and education needs related to hx of CVA . Unable to perform ADLs independently . Unable to perform IADLs independently  Nurse Case Manager Clinical Goal(s):  . patient will work with PCP and specialists to address needs related to medical management for stroke prevention . patient will meet with RN Care Manager to address self-management for stroke prevention  Interventions:  . 1:1 collaboration with Dettinger, Fransisca Kaufmann, MD regarding development and update of comprehensive plan of care as evidenced by provider attestation and co-signature . Inter-disciplinary  care team collaboration (see longitudinal plan of care) . Evaluation of current treatment plan related to hx of CVA and patient's adherence to plan as established by provider. . Chart reviewed including relevant office notes, lab results, hosp notes, and imaging reports . Reviewed and discussed mediations o ASA 81mg   o Plavix o Patient is compliant with medications . Advised to monitor for sudden or new changes in attention, vision changes, language pattern, motor control and mobility, as well as facial drooping, unilateral weakness, confusion and fatigue that may indicate recurrence.  o Seek emergency medical attention for these sign/symptoms . Reviewed and discussed upcoming appointments . Encouraged patient/wife to reach out to [facility] as needed  Patient Goals/Self-Care Activities Over the next 60 days, patient will: . Take Plavix and ASA daily as prescribed . Seek emergency medical attention for sudden or new changes in attention, vision changes, language pattern, motor control and mobility, as well as facial drooping, unilateral weakness, confusion and fatigue . Keep all medical appointments . Call [facility] as needed [phone]    Problem: Harm or Injury (Stroke)   Priority: High    Long-Range Goal: Harm or Injury Prevented   Start Date: 06/15/2020  This Visit's Progress: On track  Priority: High  Note:   Current Barriers:  . Care Coordination needs related to high fall risk in a patient with balance problems and limited mobility s/p CVA . Unable to perform ADLs  independently . Unable to perform IADLs independently  Nurse Case Manager Clinical Goal(s):  . patient will meet with RN Care Manager to address self-management of high fall risk  Interventions:  . 1:1 collaboration with Dettinger, Fransisca Kaufmann, MD regarding development and update of comprehensive plan of care as evidenced by provider attestation and co-signature . Inter-disciplinary care team collaboration (see longitudinal  plan of care) . Fall risk assessment performed o No falls since last RNCM telephone call in 04/2020 . Discussed use of assistive devices o Uses a walker for all ambulation . Discussed mobility o Balance and mobility have improved and seem stable at this time . Discussed fall prevention strategies . Reviewed upcoming appointments . Encouraged to talk with LCSW regarding depression and other psychosocial concerns . Encouraged to reach out to [facility] as needed  Patient Goals/Self-Care Activities Over the next 60 days, patient will: . Talk with LCSW regarding depression and other psychosocial concerns . Use walker at all times . Move carefully and change positions slowly to reduce fall risk . Wear well fitting shoes . Keep a clear path through your home . Call [facility] as needed [phone]    Problem: Decreased Appetite   Priority: Medium    Long-Range Goal: Improved Apptetite and Calorie Intake   This Visit's Progress: Not on track  Priority: Medium  Note:   Current Barriers:  . Care Coordination needs related to decreased appetite in a patient with hx of CVA  and depression . Unable to perform ADLs independently . Unable to perform IADLs independently  Nurse Case Manager Clinical Goal(s):  . patient will work with PCP to address needs related to medical management of decreased appetite . patient will meet with RN Care Manager to address self-management of decreased appetite . patient will demonstrate improved adherence to prescribed treatment plan for increasing calorie intake as evidenced byeating 3 meals a day along with snacks or nutritional supplement  Interventions:  . 1:1 collaboration with Dettinger, Fransisca Kaufmann, MD regarding development and update of comprehensive plan of care as evidenced by provider attestation and co-signature . Inter-disciplinary care team collaboration (see longitudinal plan of care) . Talked with wife by telephone o Per wife, he is eating a little more  but still not very much o Typically eats 2 meals a day with snacks in between o Encouraged nutritious high calory foods and provide foods that he enjoys eating o Doesn't use an supplements like Boost or Ensure. Doesn't like the way they taste o Taking a multivitamin . Previously discussed changes after CVA, decreased activity level, and potential changes in taste as possible reasons for decreased appetite . Discussed monitoring weight and advised to reach out to PCP with any noticeable weight loss  . Consider discussing Megace with PCP . Call PCP with any new or worsening symptoms  Patient Goals/Self-Care Activities Over the next 60 days, patient will: . Eat 3 meals a day along with snacks in between . Find a nutritional supplement like Boost or Ensure that he likes . Monitor weight and call PCP if losing weight . Call PCP with any new or worsening symptoms . Talk with PCP about medications to help increase appetite . Keep appt with PCP on 08/20/20 . Call [facility] as needed [phone]      Follow Up Plan:  . Telephone follow up appointment with care management team member scheduled for: LCSW on 06/24/20, RNCM on 08/24/20 . The patient has been provided with contact information for the care management team and has  been advised to call with any health related questions or concerns.  . Next PCP appointment scheduled for: Dr Dettinger on 08/20/20  [name], BSN, RN-BC [place] / [place] Management Direct Dial: [phone]

## 2020-06-15 NOTE — Patient Instructions (Signed)
Visit Information  PATIENT GOALS: Goals Addressed              This Visit's Progress     Patient Stated   .  Improved Appetite and Calorie Intake (pt-stated)   Not on track     Timeframe:  Long-Range Goal Priority:  Medium Start Date: 06/15/20                            Expected End Date: 04/03/21                      Follow-up: 08/24/20  . Eat 3 meals a day along with snacks in between . Find a nutritional supplement like Boost or Ensure that he likes . Monitor weight and call PCP if losing weight . Call PCP with any new or worsening symptoms . Talk with PCP about medications to help increase appetite . Keep appt with PCP on 08/20/20 . Call [facility] as needed [phone] .       Other   .  Prevent Falls and Injury   On track     Timeframe:  Long-Range Goal Priority:  High Start Date:  06/15/20                           Expected End Date:  04/03/21                       Follow Up Date 08/24/20    . Talk with LCSW regarding depression and other psychosocial concerns . Use walker at all times . Move carefully and change positions slowly to reduce fall risk . Wear well fitting shoes . Keep a clear path through your home . Call [facility] as needed [phone]   Why is this important?    Most falls happen when it is hard for you to walk safely. Your balance may be off because of an illness. You may have pain in your knees, hip or other joints.   You may be overly tired or taking medicines that make you sleepy. You may not be able to see or hear clearly.   Falls can lead to broken bones, bruises or other injuries.   There are things you can do to help prevent falling.     Notes:     .  Recurrence of Stroke Prevented   On track     Timeframe:  Long-Range Goal Priority:  High Start Date:   06/15/20                          Expected End Date:   04/03/21                    Follow-up: 08/24/20  . Take Plavix and ASA daily as prescribed . Seek emergency medical attention for  sudden or new changes in attention, vision changes, language pattern, motor control and mobility, as well as facial drooping, unilateral weakness, confusion and fatigue . Keep all medical appointments . Call [facility] as needed [phone]    .  Track and Manage My Blood Pressure-Hypertension   On track     Timeframe:  Long-Range Goal Priority:  Medium Start Date:  06/15/20  Expected End Date: 04/03/21                       Follow Up Date 08/24/20    . Check and record blood pressure at least 3 times per week . Take medications as directed . Call PCP with any readings outside of recommended range [phone] . Call [facility] as needed [phone]    Why is this important?    You won't feel high blood pressure, but it can still hurt your blood vessels.   High blood pressure can cause heart or kidney problems. It can also cause a stroke.   Making lifestyle changes like losing a little weight or eating less salt will help.   Checking your blood pressure at home and at different times of the day can help to control blood pressure.   If the doctor prescribes medicine remember to take it the way the doctor ordered.   Call the office if you cannot afford the medicine or if there are questions about it.     Notes:        Patient verbalizes understanding of instructions provided today and agrees to view in [place].   Follow Up Plan:  . Telephone follow up appointment with care management team member scheduled for: LCSW on 06/24/20, RNCM on 08/24/20 . The patient has been provided with contact information for the care management team and has been advised to call with any health related questions or concerns.  . Next PCP appointment scheduled for: Dr Dettinger on 08/20/20  [name], BSN, RN-BC [place] / [place] Management Direct Dial: [phone]

## 2020-06-24 ENCOUNTER — Ambulatory Visit: Payer: Medicare HMO | Admitting: Licensed Clinical Social Worker

## 2020-06-24 DIAGNOSIS — I10 Essential (primary) hypertension: Secondary | ICD-10-CM | POA: Diagnosis not present

## 2020-06-24 DIAGNOSIS — N400 Enlarged prostate without lower urinary tract symptoms: Secondary | ICD-10-CM | POA: Diagnosis not present

## 2020-06-24 DIAGNOSIS — E782 Mixed hyperlipidemia: Secondary | ICD-10-CM

## 2020-06-24 DIAGNOSIS — F339 Major depressive disorder, recurrent, unspecified: Secondary | ICD-10-CM

## 2020-06-24 DIAGNOSIS — J449 Chronic obstructive pulmonary disease, unspecified: Secondary | ICD-10-CM

## 2020-06-24 DIAGNOSIS — Z720 Tobacco use: Secondary | ICD-10-CM

## 2020-06-24 DIAGNOSIS — Z8673 Personal history of transient ischemic attack (TIA), and cerebral infarction without residual deficits: Secondary | ICD-10-CM

## 2020-06-24 NOTE — Patient Instructions (Signed)
Visit Information  PATIENT GOALS: Goals Addressed              This Visit's Progress   .  Manage Depression and Depression symptoms (pt-stated)         Timeframe : Short Term  This  Visit's Progress : On Track   Priority: Medium   Start Date : 06/24/20   Expected End Date 09/24/20   Completed Date   Patient Self Care Activities:  . Eats meals with set up . Communicates regularly with spouse, [name]  Patient Coping Strengths:  . Communicates regularly with spouse, [name] . Completes ADLs  Patient Self Care Deficits:  [name] Depression symptoms . Mobility issues  Patient Goals:  - spend time or talk with others every day - practice relaxation or meditation daily - keep a calendar with appointment dates  Follow Up Plan: LCSW to call client or spouse of client on 07/24/20 to assess client needs       [name].Marqus Macphee MSW, LCSW Licensed Clinical Social Worker [facility] Care Management [phone]

## 2020-06-24 NOTE — Chronic Care Management (AMB) (Signed)
Chronic Care Management    Clinical Social Work Note  06/24/2020 Name: Walter [name] MRN: [id number] DOB: 1950-08-22  Walter [name] is a 70 y.o. year old male who is a primary care patient of Dettinger, Fransisca Kaufmann, MD. The CCM team was consulted to assist the patient with chronic disease management and/or care coordination needs related to: [name] .   Engaged with patient/Walter [name], spouse of patient, by telephone for follow up visit in response to provider referral for social work chronic care management and care coordination services.   Consent to Services:  The patient was given information about Chronic Care Management services, agreed to services, and gave verbal consent prior to initiation of services.  Please see initial visit note for detailed documentation.   Patient agreed to services and consent obtained.   Assessment: Review of patient past medical history, allergies, medications, and health status, including review of relevant consultants reports was performed today as part of a comprehensive evaluation and provision of chronic care management and care coordination services.     SDOH (Social Determinants of Health) assessments and interventions performed:    Advanced Directives Status: See Vynca application for related entries.  CCM Care Plan  No Known Allergies  Outpatient Encounter Medications as of 06/24/2020  Medication Sig  . acetaminophen (TYLENOL) 325 MG tablet Take 1-2 tablets (325-650 mg total) by mouth every 6 (six) hours as needed for mild pain (pain score 1-3).  [name] amLODipine (NORVASC) 5 MG tablet Take 1 tablet (5 mg total) by mouth daily.  [name] aspirin EC 81 MG tablet Take 81 mg by mouth daily. Swallow whole.  [name] atorvastatin (LIPITOR) 40 MG tablet Take 1 tablet (40 mg total) by mouth daily.  . clopidogrel (PLAVIX) 75 MG tablet TAKE 1 TABLET DAILY. RESTART AFTER 3 - 4 DAYS AFTER BLOOD IN URINE CLEARS  . ferrous sulfate 325 (65 FE) MG tablet Take 1  tablet (325 mg total) by mouth 2 (two) times daily with a meal.  . Multiple Vitamins-Minerals (ONE-A-DAY 50 PLUS PO) Take by mouth daily.  . polyethylene glycol (MIRALAX / GLYCOLAX) 17 g packet Take 17 g by mouth daily as needed for mild constipation.  . tamsulosin (FLOMAX) 0.4 MG CAPS capsule Take 1 capsule (0.4 mg total) by mouth daily after breakfast.   No facility-administered encounter medications on file as of 06/24/2020.    Patient Active Problem List   Diagnosis Date Noted  . S/P left THA, AA 09/09/2019  . DNR (do not resuscitate) 09/08/2019  . Hip fracture, left, closed, initial encounter ([place]) 09/07/2019  . Depression, recurrent ([place]) 04/06/2018  . Hyperlipidemia, mixed 04/06/2018  . HTN (hypertension) 01/12/2015  . BPH (benign prostatic hyperplasia) 01/12/2015  . Nodule of kidney 02/14/2014  . Weakness 02/11/2014  . Recurrent colitis due to Clostridium difficile 02/11/2014  . Occlusion and stenosis of carotid artery without mention of cerebral infarction 09/07/2012  . History of CVA (cerebrovascular accident) 02/18/2012  . Tobacco abuse 02/18/2012  . Non-compliance 02/18/2012  . COPD (chronic obstructive pulmonary disease) ([place]) 02/18/2012    Conditions to be addressed/monitored: ; Monitor depression issues of client  Care Plan : LCSW care plan  Updates made by [name], LCSW since 06/24/2020 12:00 AM    Problem: Depression Identification (Depression)     Goal: Manage depression and depression symptoms   Start Date: 06/24/2020  Expected End Date: 09/24/2020  This Visit's Progress: On track  Priority: Medium  Note:   Current Barriers:  .  Managing Depression symptoms . Mobility issues . Suicidal Ideation/Homicidal Ideation: No  Clinical Social Work Goal(s):  . patient will work with SW monthly by telephone or in person to reduce or manage symptoms related to depression and depression symptoms . Attend scheduled medical appointments in next 30  days  Interventions: . Talked with [name], spouse of client about client needs . Talked with [name] about mobility of client (uses a cane to help him walk) . Talked with [name] about client completion of ADLs . Talked with [name] about upcoming client medical appointments . Talked with [name] about client relaxation method (walking outdoors, sitting on porch) . Talked with [name] about client appetite . Talked with [name] about sleeping issues of client . Talked with [name] about transport needs of client . Talked with [name] about vision of client . Talked with [name] about family support for client . Talked with [name] about mood of client . Talk with [name] about client coping skills (he takes a nap as needed)  Patient Self Care Activities:  . Eats meals with set up . Communicates regularly with spouse, [name]  Patient Coping Strengths:  . Communicates regularly with spouse, [name] . Completes ADLs  Patient Self Care Deficits:  [name] Depression symptoms . Mobility issues  Patient Goals:  - spend time or talk with others every day - practice relaxation or meditation daily - keep a calendar with appointment dates  Follow Up Plan: LCSW to call client or spouse of client on 07/24/20 to assess client needs     [name].[name] MSW, LCSW Licensed Clinical Social Worker [facility] Care Management [phone]

## 2020-07-06 ENCOUNTER — Other Ambulatory Visit: Payer: Self-pay | Admitting: Family Medicine

## 2020-07-06 DIAGNOSIS — E782 Mixed hyperlipidemia: Secondary | ICD-10-CM

## 2020-07-06 DIAGNOSIS — I10 Essential (primary) hypertension: Secondary | ICD-10-CM

## 2020-07-06 DIAGNOSIS — Z8673 Personal history of transient ischemic attack (TIA), and cerebral infarction without residual deficits: Secondary | ICD-10-CM

## 2020-07-24 ENCOUNTER — Telehealth: Payer: Medicare HMO

## 2020-08-20 ENCOUNTER — Ambulatory Visit: Payer: Medicare HMO | Admitting: Family Medicine

## 2020-08-20 ENCOUNTER — Ambulatory Visit (INDEPENDENT_AMBULATORY_CARE_PROVIDER_SITE_OTHER): Payer: Medicare HMO | Admitting: Licensed Clinical Social Worker

## 2020-08-20 DIAGNOSIS — F339 Major depressive disorder, recurrent, unspecified: Secondary | ICD-10-CM | POA: Diagnosis not present

## 2020-08-20 DIAGNOSIS — J449 Chronic obstructive pulmonary disease, unspecified: Secondary | ICD-10-CM | POA: Diagnosis not present

## 2020-08-20 DIAGNOSIS — Z8673 Personal history of transient ischemic attack (TIA), and cerebral infarction without residual deficits: Secondary | ICD-10-CM

## 2020-08-20 DIAGNOSIS — E782 Mixed hyperlipidemia: Secondary | ICD-10-CM

## 2020-08-20 DIAGNOSIS — N400 Enlarged prostate without lower urinary tract symptoms: Secondary | ICD-10-CM

## 2020-08-20 DIAGNOSIS — Z9119 Patient's noncompliance with other medical treatment and regimen: Secondary | ICD-10-CM

## 2020-08-20 DIAGNOSIS — Z91199 Patient's noncompliance with other medical treatment and regimen due to unspecified reason: Secondary | ICD-10-CM

## 2020-08-20 DIAGNOSIS — Z720 Tobacco use: Secondary | ICD-10-CM

## 2020-08-20 DIAGNOSIS — I10 Essential (primary) hypertension: Secondary | ICD-10-CM | POA: Diagnosis not present

## 2020-08-20 NOTE — Chronic Care Management (AMB) (Signed)
Chronic Care Management    Clinical Social Work Note  08/20/2020 Name: Walter [name] MRN: [id number] DOB: [birth year]  Walter [name] is a 70 y.o. year old male who is a primary care patient of [name], Walter Kaufmann, MD. The CCM team was consulted to assist the patient with chronic disease management and/or care coordination needs related to: [name] .   Engaged with patient /spouse of patient, Walter [name],  by telephone for follow up visit in response to provider referral for social work chronic care management and care coordination services.   Consent to Services:  The patient was given information about Chronic Care Management services, agreed to services, and gave verbal consent prior to initiation of services.  Please see initial visit note for detailed documentation.   Patient agreed to services and consent obtained.   Assessment: Review of patient past medical history, allergies, medications, and health status, including review of relevant consultants reports was performed today as part of a comprehensive evaluation and provision of chronic care management and care coordination services.     SDOH (Social Determinants of Health) assessments and interventions performed:  SDOH Interventions   Flowsheet Row Most Recent Value  SDOH Interventions   Depression Interventions/Treatment  Medication, Counseling       Advanced Directives Status: See Vynca application for related entries.  CCM Care Plan  No Known Allergies  Outpatient Encounter Medications as of 08/20/2020  Medication Sig  . acetaminophen (TYLENOL) 325 MG tablet Take 1-2 tablets (325-650 mg total) by mouth every 6 (six) hours as needed for mild pain (pain score 1-3).  [name] amLODipine (NORVASC) 5 MG tablet Take 1 tablet (5 mg total) by mouth daily. (NEEDS TO BE SEEN BEFORE NEXT REFILL)  . aspirin EC 81 MG tablet Take 81 mg by mouth daily. Swallow whole.  [name] atorvastatin (LIPITOR) 40 MG tablet Take 1 tablet (40 mg  total) by mouth daily. (NEEDS TO BE SEEN BEFORE NEXT REFILL)  . clopidogrel (PLAVIX) 75 MG tablet TAKE 1 TABLET DAILY  (NEEDS TO BE SEEN BEFORE NEXT REFILL)  . ferrous sulfate 325 (65 FE) MG tablet Take 1 tablet (325 mg total) by mouth 2 (two) times daily with a meal.  . Multiple Vitamins-Minerals (ONE-A-DAY 50 PLUS PO) Take by mouth daily.  . polyethylene glycol (MIRALAX / GLYCOLAX) 17 g packet Take 17 g by mouth daily as needed for mild constipation.  . tamsulosin (FLOMAX) 0.4 MG CAPS capsule Take 1 capsule (0.4 mg total) by mouth daily after breakfast.   No facility-administered encounter medications on file as of 08/20/2020.    Patient Active Problem List   Diagnosis Date Noted  . S/P left THA, AA 09/09/2019  . DNR (do not resuscitate) 09/08/2019  . Hip fracture, left, closed, initial encounter ([place]) 09/07/2019  . Depression, recurrent ([place]) 04/06/2018  . Hyperlipidemia, mixed 04/06/2018  . HTN (hypertension) 01/12/2015  . BPH (benign prostatic hyperplasia) 01/12/2015  . Nodule of kidney 02/14/2014  . Weakness 02/11/2014  . Recurrent colitis due to Clostridium difficile 02/11/2014  . Occlusion and stenosis of carotid artery without mention of cerebral infarction 09/07/2012  . History of CVA (cerebrovascular accident) 02/18/2012  . Tobacco abuse 02/18/2012  . Non-compliance 02/18/2012  . COPD (chronic obstructive pulmonary disease) ([place]) 02/18/2012    Conditions to be addressed/monitored: Monitor client management of depression and depression issues  Care Plan : LCSW care plan  Updates made by Walter Cabal, LCSW since 08/20/2020 12:00 AM    Problem: Depression Identification (  Depression)     Goal: Manage depression and depression symptoms   Start Date: 08/20/2020  Expected End Date: 11/20/2020  This Visit's Progress: On track  Recent Progress: On track  Priority: Medium  Note:   Current Barriers:  [name] Managing Depression symptoms . Mobility issues . Suicidal  Ideation/Homicidal Ideation: No  Clinical Social Work Goal(s):  . patient will work with SW monthly by telephone or in person to reduce or manage symptoms related to depression and depression symptoms . Attend scheduled medical appointments in next 30 days . Patient or spouse of patient will call RNCM as needed in next 30 days for nursing support for client  Interventions:  1:1 collaboration with Dr. Fransisca Kaufmann Dettinger, MD regarding development and update of comprehensive plan of care as evidenced by provider attestation and co-signature. Talked with Walter [name], spouse of client about client needs Talked with Walter [name] about recent client falls Talked with Walter [name] about balance issues of client Talked with Walter [name] about sleeping issues of client Talked with Walter [name] about appetite of client Talked with Walter [name] about transport needs of client Talked with Walter [name] about upcoming client medical appointments Talked with Walter [name] about client procurement of medication Talked with Walter [name] about client  DME (has 3 in 1 bedside commode,, has a standard walker and has a rolling walker) Talked with Walter [name] about vision issues of client Encouraged client or Walter [name] to call RNCM as needed for nursing support for client Talked with Walter [name] about mobility of client (uses a cane to help him walk)  Patient Self Care Activities:  . Eats meals with set up . Communicates regularly with spouse, Walter [name]  Patient Coping Strengths:  . Communicates regularly with spouse, Walter [name] . Completes ADLs  Patient Self Care Deficits:  [name] Depression symptoms . Mobility issues  Patient Goals:  - spend time or talk with others every day - practice relaxation or meditation daily - keep a calendar with appointment dates  Follow Up Plan: LCSW to call client or spouse of client on 09/30/20 to assess client needs     Walter [name].Walter [name] MSW, LCSW Licensed Clinical Social Worker [facility] Care Management [phone]

## 2020-08-20 NOTE — Patient Instructions (Signed)
Visit Information  PATIENT GOALS: Goals Addressed            This Visit's Progress   . Protect My Health;Manage Depression issues       Timeframe:  Short-Term Goal Priority:  Medium Progress: On Track Start Date:             08/20/20                Expected End Date:           11/20/20            Follow Up Date 09/30/20   Protect My Health (Patient)  Manage Depression issues    Why is this important?    Screening tests can find diseases early when they are easier to treat.   Your doctor or nurse will talk with you about which tests are important for you.   Getting shots for common diseases like the flu and shingles will help prevent them.     Patient Self Care Activities:  . Eats meals with set up . Communicates regularly with spouse, Walter [name]  Patient Coping Strengths:  . Communicates regularly with spouse, Walter [name] . Completes ADLs  Patient Self Care Deficits:  [name] Depression symptoms . Mobility issues  Patient Goals:  - spend time or talk with others every day - practice relaxation or meditation daily - keep a calendar with appointment dates  Follow Up Plan: LCSW to call client or spouse of client on 09/30/20 to assess client needs       [name].Walter [name] MSW, LCSW Licensed Clinical Social Worker [facility] Care Management [phone]

## 2020-08-21 ENCOUNTER — Ambulatory Visit (INDEPENDENT_AMBULATORY_CARE_PROVIDER_SITE_OTHER): Payer: Medicare HMO

## 2020-08-21 VITALS — Ht 73.0 in | Wt 173.0 lb

## 2020-08-21 DIAGNOSIS — Z0000 Encounter for general adult medical examination without abnormal findings: Secondary | ICD-10-CM

## 2020-08-21 NOTE — Patient Instructions (Addendum)
Mr. Walter [name] , Thank you for taking time to come for your Medicare Wellness Visit. I appreciate your ongoing commitment to your health goals. Please review the following plan we discussed and let me know if I can assist you in the future.   Screening recommendations/referrals: Colonoscopy:  Recommended yearly ophthalmology/optometry visit for glaucoma screening and checkup Recommended yearly dental visit for hygiene and checkup  Vaccinations: Influenza vaccine: Patient declined Pneumococcal vaccine: Patient declined Tdap vaccine: Patient declined Shingles vaccine: Patient declined   Covid-19: Patient declined  Advanced directives: Please bring a copy of your health care power of attorney and living will to the office to be added to your chart at your convenience.  Conditions/risks identified: Continue practicing fall prevention - try to move and exercise at least 30 minutes each day  Next appointment: Follow up in one year for your annual wellness visit.   Preventive Care 72 Years and Older, Male  Preventive care refers to lifestyle choices and visits with your health care provider that can promote health and wellness. What does preventive care include?  A yearly physical exam. This is also called an annual well check.  Dental exams once or twice a year.  Routine eye exams. Ask your health care provider how often you should have your eyes checked.  Personal lifestyle choices, including:  Daily care of your teeth and gums.  Regular physical activity.  Eating a healthy diet.  Avoiding tobacco and drug use.  Limiting alcohol use.  Practicing safe sex.  Taking low doses of aspirin every day.  Taking vitamin and mineral supplements as recommended by your health care provider. What happens during an annual well check? The services and screenings done by your health care provider during your annual well check will depend on your age, overall health, lifestyle risk factors,  and family history of disease. Counseling  Your health care provider may ask you questions about your:  Alcohol use.  Tobacco use.  Drug use.  Emotional well-being.  Home and relationship well-being.  Sexual activity.  Eating habits.  History of falls.  Memory and ability to understand (cognition).  Work and work Statistician. Screening  You may have the following tests or measurements:  Height, weight, and BMI.  Blood pressure.  Lipid and cholesterol levels. These may be checked every 5 years, or more frequently if you are over 69 years old.  Skin check.  Lung cancer screening. You may have this screening every year starting at age 27 if you have a 30-pack-year history of smoking and currently smoke or have quit within the past 15 years.  Fecal occult blood test (FOBT) of the stool. You may have this test every year starting at age 51.  Flexible sigmoidoscopy or colonoscopy. You may have a sigmoidoscopy every 5 years or a colonoscopy every 10 years starting at age 37.  Prostate cancer screening. Recommendations will vary depending on your family history and other risks.  Hepatitis C blood test.  Hepatitis B blood test.  Sexually transmitted disease (STD) testing.  Diabetes screening. This is done by checking your blood sugar (glucose) after you have not eaten for a while (fasting). You may have this done every 1-3 years.  Abdominal aortic aneurysm (AAA) screening. You may need this if you are a current or former smoker.  Osteoporosis. You may be screened starting at age 16 if you are at high risk. Talk with your health care provider about your test results, treatment options, and if necessary, the need for  more tests. Vaccines  Your health care provider may recommend certain vaccines, such as:  Influenza vaccine. This is recommended every year.  Tetanus, diphtheria, and acellular pertussis (Tdap, Td) vaccine. You may need a Td booster every 10  years.  Zoster vaccine. You may need this after age 34.  Pneumococcal 13-valent conjugate (PCV13) vaccine. One dose is recommended after age 1.  Pneumococcal polysaccharide (PPSV23) vaccine. One dose is recommended after age 11. Talk to your health care provider about which screenings and vaccines you need and how often you need them. This information is not intended to replace advice given to you by your health care provider. Make sure you discuss any questions you have with your health care provider. Document Released: 04/17/2015 Document Revised: 12/09/2015 Document Reviewed: 01/20/2015 Elsevier Interactive Patient Education  2017 [employer] Prevention in the Home Falls can cause injuries. They can happen to people of all ages. There are many things you can do to make your home safe and to help prevent falls. What can I do on the outside of my home?  Regularly fix the edges of walkways and driveways and fix any cracks.  Remove anything that might make you trip as you walk through a door, such as a raised step or threshold.  Trim any bushes or trees on the path to your home.  Use bright outdoor lighting.  Clear any walking paths of anything that might make someone trip, such as rocks or tools.  Regularly check to see if handrails are loose or broken. Make sure that both sides of any steps have handrails.  Any raised decks and porches should have guardrails on the edges.  Have any leaves, snow, or ice cleared regularly.  Use sand or salt on walking paths during winter.  Clean up any spills in your garage right away. This includes oil or grease spills. What can I do in the bathroom?  Use night lights.  Install grab bars by the toilet and in the tub and shower. Do not use towel bars as grab bars.  Use non-skid mats or decals in the tub or shower.  If you need to sit down in the shower, use a plastic, non-slip stool.  Keep the floor dry. Clean up any water that  spills on the floor as soon as it happens.  Remove soap buildup in the tub or shower regularly.  Attach bath mats securely with double-sided non-slip rug tape.  Do not have throw rugs and other things on the floor that can make you trip. What can I do in the bedroom?  Use night lights.  Make sure that you have a light by your bed that is easy to reach.  Do not use any sheets or blankets that are too big for your bed. They should not hang down onto the floor.  Have a firm chair that has side arms. You can use this for support while you get dressed.  Do not have throw rugs and other things on the floor that can make you trip. What can I do in the kitchen?  Clean up any spills right away.  Avoid walking on wet floors.  Keep items that you use a lot in easy-to-reach places.  If you need to reach something above you, use a strong step stool that has a grab bar.  Keep electrical cords out of the way.  Do not use floor polish or wax that makes floors slippery. If you must use wax, use non-skid  floor wax.  Do not have throw rugs and other things on the floor that can make you trip. What can I do with my stairs?  Do not leave any items on the stairs.  Make sure that there are handrails on both sides of the stairs and use them. Fix handrails that are broken or loose. Make sure that handrails are as long as the stairways.  Check any carpeting to make sure that it is firmly attached to the stairs. Fix any carpet that is loose or worn.  Avoid having throw rugs at the top or bottom of the stairs. If you do have throw rugs, attach them to the floor with carpet tape.  Make sure that you have a light switch at the top of the stairs and the bottom of the stairs. If you do not have them, ask someone to add them for you. What else can I do to help prevent falls?  Wear shoes that:  Do not have high heels.  Have rubber bottoms.  Are comfortable and fit you well.  Are closed at the  toe. Do not wear sandals.  If you use a stepladder:  Make sure that it is fully opened. Do not climb a closed stepladder.  Make sure that both sides of the stepladder are locked into place.  Ask someone to hold it for you, if possible.  Clearly mark and make sure that you can see:  Any grab bars or handrails.  First and last steps.  Where the edge of each step is.  Use tools that help you move around (mobility aids) if they are needed. These include:  Canes.  Walkers.  Scooters.  Crutches.  Turn on the lights when you go into a dark area. Replace any light bulbs as soon as they burn out.  Set up your furniture so you have a clear path. Avoid moving your furniture around.  If any of your floors are uneven, fix them.  If there are any pets around you, be aware of where they are.  Review your medicines with your doctor. Some medicines can make you feel dizzy. This can increase your chance of falling. Ask your doctor what other things that you can do to help prevent falls. This information is not intended to replace advice given to you by your health care provider. Make sure you discuss any questions you have with your health care provider. Document Released: 01/15/2009 Document Revised: 08/27/2015 Document Reviewed: 04/25/2014 Elsevier Interactive Patient Education  2017 [employer].

## 2020-08-21 NOTE — Progress Notes (Signed)
Subjective:   Walter [name] is a 70 y.o. male who presents for Medicare Annual/Subsequent preventive examination.  Virtual Visit via Telephone Note  I connected with  Walter Walter [name] on 08/21/20 at  3:30 PM EDT by telephone and verified that I am speaking with the correct person using two identifiers.  Location: Patient: Home Provider: [facility] Persons participating in the virtual visit: patient, his wife/Nurse Health Advisor   I discussed the limitations, risks, security and privacy concerns of performing an evaluation and management service by telephone and the availability of in person appointments. The patient expressed understanding and agreed to proceed.  Interactive audio and video telecommunications were attempted between this nurse and patient, however failed, due to patient having technical difficulties OR patient did not have access to video capability.  We continued and completed visit with audio only.  Some vital signs may be absent or patient reported.   Walter Apostol E Lacey Wallman, LPN   Review of Systems     Cardiac Risk Factors include: advanced age (>4655men, 45>65 women);sedentary lifestyle;smoking/ tobacco exposure;hypertension;male gender;dyslipidemia     Objective:    Today's Vitals   08/21/20 1432  Weight: 173 lb (78.5 kg)  Height: 6\' 1"  (1.854 m)   Body mass index is 22.82 kg/m.  Advanced Directives 08/21/2020 09/07/2019 03/21/2019 12/14/2017 02/12/2014 02/11/2014 01/30/2014  Does Patient Have a Medical Advance Directive? Yes No No No No No No  Type of Estate agentAdvance Directive Healthcare Power of [place];Living will - - - - - -  Copy of Healthcare Power of Attorney in Chart? No - copy requested - - - - - -  Would patient like information on creating a medical advance directive? - No - Patient declined - No - Patient declined No - patient declined information No - patient declined information No - patient declined information    Current Medications (verified) Outpatient Encounter  Medications as of 08/21/2020  Medication Sig  . acetaminophen (TYLENOL) 325 MG tablet Take 1-2 tablets (325-650 mg total) by mouth every 6 (six) hours as needed for mild pain (pain score 1-3).  [name]. amLODipine (NORVASC) 5 MG tablet Take 1 tablet (5 mg total) by mouth daily. (NEEDS TO BE SEEN BEFORE NEXT REFILL)  . aspirin EC 81 MG tablet Take 81 mg by mouth daily. Swallow whole.  [name]. atorvastatin (LIPITOR) 40 MG tablet Take 1 tablet (40 mg total) by mouth daily. (NEEDS TO BE SEEN BEFORE NEXT REFILL)  . clopidogrel (PLAVIX) 75 MG tablet TAKE 1 TABLET DAILY  (NEEDS TO BE SEEN BEFORE NEXT REFILL)  . ferrous sulfate 325 (65 FE) MG tablet Take 1 tablet (325 mg total) by mouth 2 (two) times daily with a meal.  . Multiple Vitamins-Minerals (ONE-A-DAY 50 PLUS PO) Take by mouth daily.  . polyethylene glycol (MIRALAX / GLYCOLAX) 17 g packet Take 17 g by mouth daily as needed for mild constipation.  . tamsulosin (FLOMAX) 0.4 MG CAPS capsule Take 1 capsule (0.4 mg total) by mouth daily after breakfast.   No facility-administered encounter medications on file as of 08/21/2020.    Allergies (verified) Patient has no known allergies.   History: Past Medical History:  Diagnosis Date  . Acute urinary retention 02/12/2014  . Brain TIA    recurrent   . C. difficile enteritis   . Carotid bruit   . COPD (chronic obstructive pulmonary disease) (HCC)   . Coronary artery disease   . Myocardial infarction [facility](HCC)    incidental  . Nodule of kidney 02/14/14  solid on left  . Recurrent colitis due to Clostridium difficile 02/11/2014  . Stroke [facility]) 1997   Past Surgical History:  Procedure Laterality Date  . BACK SURGERY    . HIP ARTHROPLASTY Left 09/09/2019   Procedure: ARTHROPLASTY BIPOLAR HIP (HEMIARTHROPLASTY);  Surgeon: Paralee Cancel, MD;  Location: [place];  Service: Orthopedics;  Laterality: Left;  . KNEE SURGERY     Family History  Problem Relation Age of Onset  . Cancer Father 3       stomach  .  Coronary artery disease Mother 55  . Stroke Mother   . Coronary artery disease Brother 17       AMI deceased  . Heart attack Daughter    Social History   Socioeconomic History  . Marital status: Married    Spouse name: Not on file  . Number of children: 1  . Years of education: 39  . Highest education level: 10th grade  Occupational History  . Occupation: truck Geophysicist/field seismologist    Comment: unable becasue of CVA  . Occupation: disability    Comment: since 2000  Tobacco Use  . Smoking status: Former Smoker    Packs/day: 1.00    Years: 55.00    Pack years: 55.00    Types: Cigarettes  . Smokeless tobacco: Never Used  Vaping Use  . Vaping Use: Never used  Substance and Sexual Activity  . Alcohol use: No  . Drug use: No  . Sexual activity: Not Currently  Other Topics Concern  . Not on file  Social History Narrative   One level living and handicap accessible bathroom - had stroke in 2021 - speech impaired, impaired mobility   Social Determinants of Health   Financial Resource Strain: Low Risk   . Difficulty of Paying Living Expenses: Not hard at all  Food Insecurity: No Food Insecurity  . Worried About Charity fundraiser in the Last Year: Never true  . Ran Out of Food in the Last Year: Never true  Transportation Needs: No Transportation Needs  . Lack of Transportation (Medical): No  . Lack of Transportation (Non-Medical): No  Physical Activity: Inactive  . Days of Exercise per Week: 0 days  . Minutes of Exercise per Session: 0 min  Stress: No Stress Concern Present  . Feeling of Stress : Not at all  Social Connections: Moderately Isolated  . Frequency of Communication with Friends and Family: More than three times a week  . Frequency of Social Gatherings with Friends and Family: More than three times a week  . Attends Religious Services: Never  . Active Member of Clubs or Organizations: No  . Attends Archivist Meetings: Never  . Marital Status: Married     Tobacco Counseling Counseling given: Not Answered   Clinical Intake:  Pre-visit preparation completed: Yes  Pain : No/denies pain     BMI - recorded: 22.82 Nutritional Status: BMI of 19-24  Normal Nutritional Risks: None Diabetes: No  How often do you need to have someone help you when you read instructions, pamphlets, or other written materials from your doctor or pharmacy?: 4 - Often  Diabetic? no  Interpreter Needed?: No  Comments: speech is impaired from stroke Information entered by :: Dylen Mcelhannon, LPN   Activities of Daily Living In your present state of health, do you have any difficulty performing the following activities: 08/21/2020 04/17/2020  Hearing? N -  Vision? N -  Difficulty concentrating or making decisions? Tempie Donning  Walking or climbing  stairs? Y Y  Comment - uses a cane or holds on to furniture  Dressing or bathing? Y Y  Comment - prefers sponge bath and needs assistance with dressing  Doing errands, shopping? Tempie Donning  Preparing Food and eating ? Y Y  Comment - can eat on his own but needs assistance with preparation  Using the Toilet? Y -  In the past six months, have you accidently leaked urine? Y -  Do you have problems with loss of bowel control? Y -  Managing your Medications? Y -  Managing your Finances? Y -  Housekeeping or managing your Housekeeping? Y -  Some recent data might be hidden    Patient Care Team: Dettinger, Fransisca Kaufmann, MD as PCP - General (Family Medicine) Gala Romney Cristopher Estimable, MD as Consulting Physician (Gastroenterology) Shea Evans [name], LCSW as Social Worker (Licensed Clinical Social Worker) Ilean China, RN as Case Manager  Indicate any recent [place] you may have received from other than [facility] providers in the past year (date may be approximate).     Assessment:   This is a routine wellness examination for [place].  Hearing/Vision screen  Hearing Screening   125Hz  250Hz  500Hz  1000Hz  2000Hz  3000Hz  4000Hz  6000Hz   8000Hz   Right ear:           Left ear:           Comments: Denies hearing difficulties   Vision Screening Comments: Denies vision difficulties - doesn't see an eye doctor  Dietary issues and exercise activities discussed: Current Exercise Habits: The patient does not participate in regular exercise at present, Exercise limited by: neurologic condition(s);orthopedic condition(s)  Goals Addressed              This Visit's Progress   .  DIET - INCREASE WATER INTAKE   Not on track   .  Improved Appetite and Calorie Intake (pt-stated)   On track     Timeframe:  Long-Range Goal Priority:  Medium Start Date: 06/15/20                            Expected End Date: 04/03/21                      Follow-up: 08/24/20  . Eat 3 meals a day along with snacks in between . Find a nutritional supplement like Boost or Ensure that he likes . Monitor weight and call PCP if losing weight . Call PCP with any new or worsening symptoms . Talk with PCP about medications to help increase appetite . Keep appt with PCP on 08/20/20 . Call [facility] as needed [phone] .     [name]  Prevent Falls and Injury   Not on track     Timeframe:  Long-Range Goal Priority:  High Start Date:  06/15/20                           Expected End Date:  04/03/21                       Follow Up Date 08/24/20    . Talk with LCSW regarding depression and other psychosocial concerns . Use walker at all times . Move carefully and change positions slowly to reduce fall risk . Wear well fitting shoes . Keep a clear path through your home . Call [facility] as needed [phone]  Why is this important?    Most falls happen when it is hard for you to walk safely. Your balance may be off because of an illness. You may have pain in your knees, hip or other joints.   You may be overly tired or taking medicines that make you sleepy. You may not be able to see or hear clearly.   Falls can lead to broken bones, bruises or other injuries.    There are things you can do to help prevent falling.     Notes:     .  Quit Smoking   On track     Depression Screen PHQ 2/9 Scores 08/21/2020 08/20/2020 02/21/2020 11/21/2019 04/12/2019 10/10/2018 08/17/2018  PHQ - 2 Score 1 1 1 2  0 0 0  PHQ- 9 Score 9 7 - 8 - - -  Exception Documentation - - Patient refusal - - - -    Fall Risk Fall Risk  08/21/2020 02/21/2020 11/21/2019 07/31/2019 04/12/2019  Falls in the past year? 1 0 1 1 0  Number falls in past yr: 1 - 1 1 -  Injury with Fall? 1 - 1 1 -  Comment - - - back pain. has not been evaulated for fx -  Risk Factor Category  - - - - -  Risk for fall due to : History of fall(s);Impaired mobility;Impaired vision;Medication side effect;Mental status change;Orthopedic patient;Impaired balance/gait - Impaired balance/gait;Impaired mobility History of fall(s);Impaired balance/gait -  Follow up Falls prevention discussed;Education provided - Falls prevention discussed Falls evaluation completed;Falls prevention discussed -    FALL RISK PREVENTION PERTAINING TO THE HOME:  Any stairs in or around the home? No  If so, are there any without handrails? No  Home free of loose throw rugs in walkways, pet beds, electrical cords, etc? Yes  Adequate lighting in your home to reduce risk of falls? Yes   ASSISTIVE DEVICES UTILIZED TO PREVENT FALLS:  Life alert? No  Use of a cane, walker or w/c? Yes  Grab bars in the bathroom? Yes  Shower chair or bench in shower? Yes  Elevated toilet seat or a handicapped toilet? Yes   TIMED UP AND GO:  Was the test performed? No . Telephonic visit  Cognitive Function: Cognitive status assessed by direct observation. Patient has current diagnosis of cognitive impairment. Patient is unable to complete screening 6CIT or MMSE.   MMSE - Mini Mental State Exam 12/14/2017  Orientation to time 4  Orientation to Place 4  Registration 3  Attention/ Calculation 5  Recall 2  Language- name 2 objects 2  Language- repeat 1   Language- follow 3 step command 3  Language- read & follow direction 1  Write a sentence 1  Copy design 1  Total score 27        Immunizations There is no immunization history for the selected administration types on file for this patient.  TDAP status: Due, Education has been provided regarding the importance of this vaccine. Advised may receive this vaccine at local pharmacy or Health Dept. Aware to provide a copy of the vaccination record if obtained from local pharmacy or Health Dept. Verbalized acceptance and understanding.  Flu Vaccine status: Declined, Education has been provided regarding the importance of this vaccine but patient still declined. Advised may receive this vaccine at local pharmacy or Health Dept. Aware to provide a copy of the vaccination record if obtained from local pharmacy or Health Dept. Verbalized acceptance and understanding.  Pneumococcal vaccine status: Declined,  Education has been provided regarding the importance of this vaccine but patient still declined. Advised may receive this vaccine at local pharmacy or Health Dept. Aware to provide a copy of the vaccination record if obtained from local pharmacy or Health Dept. Verbalized acceptance and understanding.   Covid-19 vaccine status: Declined, Education has been provided regarding the importance of this vaccine but patient still declined. Advised may receive this vaccine at local pharmacy or Health Dept.or vaccine clinic. Aware to provide a copy of the vaccination record if obtained from local pharmacy or Health Dept. Verbalized acceptance and understanding.  Qualifies for Shingles Vaccine? Yes   Zostavax completed No   Shingrix Completed?: No.    Education has been provided regarding the importance of this vaccine. Patient has been advised to call insurance company to determine out of pocket expense if they have not yet received this vaccine. Advised may also receive vaccine at local pharmacy or Health  Dept. Verbalized acceptance and understanding.  Screening Tests Health Maintenance  Topic Date Due  . Hepatitis C Screening  Never done  . TETANUS/TDAP  Never done  . COLONOSCOPY (Pts 45-64yrs Insurance coverage will need to be confirmed)  Never done  . PNA vac Low Risk Adult (1 of 2 - PCV13) Never done  . COVID-19 Vaccine (1) 02/22/2021 (Originally 09/12/1962)  . HPV VACCINES  Aged Out  . INFLUENZA VACCINE  Discontinued    Health Maintenance  Health Maintenance Due  Topic Date Due  . Hepatitis C Screening  Never done  . TETANUS/TDAP  Never done  . COLONOSCOPY (Pts 45-27yrs Insurance coverage will need to be confirmed)  Never done  . PNA vac Low Risk Adult (1 of 2 - PCV13) Never done    Colorectal cancer screening: No longer required. pt declines  Lung Cancer Screening: (Low Dose CT Chest recommended if Age 74-80 years, 30 pack-year currently smoking OR have quit w/in 15years.) does qualify.   Lung Cancer Screening Referral: declines  Additional Screening:  Hepatitis C Screening: does qualify; declines  Vision Screening: Recommended annual ophthalmology exams for early detection of glaucoma and other disorders of the eye. Is the patient up to date with their annual eye exam?  No  Who is the provider or what is the name of the office in which the patient attends annual eye exams? n/a If pt is not established with a provider, would they like to be referred to a provider to establish care? No .   Dental Screening: Recommended annual dental exams for proper oral hygiene  Community Resource Referral / Chronic Care Management: CRR required this visit?  No   CCM required this visit?  No      Plan:     I have personally reviewed and noted the following in the patient's chart:   . Medical and social history . Use of alcohol, tobacco or illicit drugs  . Current medications and supplements including opioid prescriptions. Patient is not currently taking opioid  prescriptions. . Functional ability and status . Nutritional status . Physical activity . Advanced directives . List of other physicians . Hospitalizations, surgeries, and ER visits in previous 12 months . Vitals . Screenings to include cognitive, depression, and falls . Referrals and appointments  In addition, I have reviewed and discussed with patient certain preventive protocols, quality metrics, and best practice recommendations. A written personalized care plan for preventive services as well as general preventive health recommendations were provided to patient.     [place] Dionne Ano, LPN  08/21/2020   Nurse Notes: none

## 2020-08-24 ENCOUNTER — Telehealth: Payer: Medicare HMO | Admitting: *Deleted

## 2020-09-09 ENCOUNTER — Other Ambulatory Visit: Payer: Self-pay | Admitting: Family Medicine

## 2020-09-09 DIAGNOSIS — Z8673 Personal history of transient ischemic attack (TIA), and cerebral infarction without residual deficits: Secondary | ICD-10-CM

## 2020-09-09 DIAGNOSIS — E782 Mixed hyperlipidemia: Secondary | ICD-10-CM

## 2020-09-30 ENCOUNTER — Telehealth: Payer: Medicare HMO

## 2020-10-07 ENCOUNTER — Ambulatory Visit (INDEPENDENT_AMBULATORY_CARE_PROVIDER_SITE_OTHER): Payer: Medicare HMO | Admitting: Family Medicine

## 2020-10-07 ENCOUNTER — Other Ambulatory Visit: Payer: Self-pay

## 2020-10-07 ENCOUNTER — Encounter: Payer: Self-pay | Admitting: Family Medicine

## 2020-10-07 VITALS — BP 153/94 | HR 94 | Ht 73.0 in | Wt 172.0 lb

## 2020-10-07 DIAGNOSIS — J449 Chronic obstructive pulmonary disease, unspecified: Secondary | ICD-10-CM

## 2020-10-07 DIAGNOSIS — F339 Major depressive disorder, recurrent, unspecified: Secondary | ICD-10-CM

## 2020-10-07 DIAGNOSIS — Z8673 Personal history of transient ischemic attack (TIA), and cerebral infarction without residual deficits: Secondary | ICD-10-CM | POA: Diagnosis not present

## 2020-10-07 DIAGNOSIS — L819 Disorder of pigmentation, unspecified: Secondary | ICD-10-CM | POA: Diagnosis not present

## 2020-10-07 DIAGNOSIS — E782 Mixed hyperlipidemia: Secondary | ICD-10-CM | POA: Diagnosis not present

## 2020-10-07 DIAGNOSIS — I10 Essential (primary) hypertension: Secondary | ICD-10-CM

## 2020-10-07 MED ORDER — DULOXETINE HCL 30 MG PO CPEP: 30.0000 mg | ORAL_CAPSULE | Freq: Every day | ORAL | 3 refills | Status: DC

## 2020-10-07 MED ORDER — CLOPIDOGREL BISULFATE 75 MG PO TABS: 75.0000 mg | ORAL_TABLET | Freq: Every day | ORAL | 3 refills | Status: DC

## 2020-10-07 MED ORDER — ATORVASTATIN CALCIUM 40 MG PO TABS: 40.0000 mg | ORAL_TABLET | Freq: Every day | ORAL | 3 refills | Status: DC

## 2020-10-07 NOTE — Progress Notes (Signed)
BP (!) 153/94   Pulse 94   Ht 6\' 1"  (1.854 m)   Wt 172 lb (78 kg)   SpO2 97%   BMI 22.69 kg/m    Subjective:   Patient ID: Walter [name], male    DOB: 01/07/1951, 70 y.o.   MRN: [id number]  HPI: Walter [name] is a 70 y.o. male presenting on 10/07/2020 for No chief complaint on file.   HPI Hypertension Patient is currently on amlodipine, and their blood pressure today is 153/94 but he did not take his medicines here today, they will monitor at home no medication currently and denies any breathing issues today. Patient denies any lightheadedness or dizziness. Patient denies headaches, blurred vision, chest pains, shortness of breath, or weakness. Denies any side effects from medication and is content with current medication.   Depression anxiety status post CVA Patient still having depression and anxiety status post CVA.  Tried some 4 mg stomach issues.  He would like to try something different.  His wife is here with him and says that it just is not improving and has been continually depressed.  COPD Patient is coming in for COPD recheck today.  He is currently on no medications currently.  He has a mild chronic cough but denies any major coughing spells or wheezing spells.  He has 0nighttime symptoms per week and 0daytime symptoms per week currently.   Relevant past medical, surgical, family and social history reviewed and updated as indicated. Interim medical history since our last visit reviewed. Allergies and medications reviewed and updated.  Review of Systems  Constitutional:  Negative for chills and fever.  Respiratory:  Negative for shortness of breath and wheezing.   Cardiovascular:  Negative for chest pain and leg swelling.  Musculoskeletal:  Negative for back pain and gait problem.  Skin:  Negative for rash.  Neurological:  Negative for dizziness, weakness, light-headedness and numbness.  Psychiatric/Behavioral:  Positive for confusion and dysphoric mood. Negative for  self-injury, sleep disturbance and suicidal ideas. The patient is nervous/anxious.   All other systems reviewed and are negative.  Per HPI unless specifically indicated above   Allergies as of 10/07/2020   No Known Allergies      Medication List        Accurate as of October 07, 2020  4:40 PM. If you have any questions, ask your nurse or doctor.          acetaminophen 325 MG tablet Commonly known as: TYLENOL Take 1-2 tablets (325-650 mg total) by mouth every 6 (six) hours as needed for mild pain (pain score 1-3).   amLODipine 5 MG tablet Commonly known as: NORVASC Take 1 tablet (5 mg total) by mouth daily. (NEEDS TO BE SEEN BEFORE NEXT REFILL)   aspirin EC 81 MG tablet Take 81 mg by mouth daily. Swallow whole.   atorvastatin 40 MG tablet Commonly known as: LIPITOR Take 1 tablet (40 mg total) by mouth daily. What changed: additional instructions Changed by: Fransisca Kaufmann Bernis Stecher, MD   clopidogrel 75 MG tablet Commonly known as: PLAVIX Take 1 tablet (75 mg total) by mouth daily. What changed:  how much to take how to take this when to take this additional instructions Changed by: Fransisca Kaufmann Emie Sommerfeld, MD   DULoxetine 30 MG capsule Commonly known as: Cymbalta Take 1 capsule (30 mg total) by mouth at bedtime. Started by: Fransisca Kaufmann Macon Lesesne, MD   ferrous sulfate 325 (65 FE) MG tablet Take 1 tablet (325 mg total)  by mouth 2 (two) times daily with a meal.   ONE-A-DAY 50 PLUS PO Take by mouth daily.   polyethylene glycol 17 g packet Commonly known as: MIRALAX / GLYCOLAX Take 17 g by mouth daily as needed for mild constipation.   tamsulosin 0.4 MG Caps capsule Commonly known as: FLOMAX Take 1 capsule (0.4 mg total) by mouth daily after breakfast.         Objective:   BP (!) 153/94   Pulse 94   Ht 6\' 1"  (1.854 m)   Wt 172 lb (78 kg)   SpO2 97%   BMI 22.69 kg/m   Wt Readings from Last 3 Encounters:  10/07/20 172 lb (78 kg)  08/21/20 173 lb (78.5 kg)   02/21/20 173 lb (78.5 kg)    Physical Exam Vitals and nursing note reviewed.  Constitutional:      General: He is not in acute distress.    Appearance: He is well-developed. He is not diaphoretic.  Eyes:     General: No scleral icterus.    Conjunctiva/sclera: Conjunctivae normal.  Neck:     Thyroid: No thyromegaly.  Cardiovascular:     Rate and Rhythm: Normal rate and regular rhythm.     Heart sounds: Normal heart sounds. No murmur heard. Pulmonary:     Effort: Pulmonary effort is normal. No respiratory distress.     Breath sounds: Normal breath sounds. No wheezing.  Musculoskeletal:     Cervical back: Neck supple.  Lymphadenopathy:     Cervical: No cervical adenopathy.  Skin:    General: Skin is warm and dry.     Findings: No rash.  Neurological:     Mental Status: He is alert and oriented to person, place, and time.     Coordination: Coordination normal.  Psychiatric:        Behavior: Behavior normal.      Assessment & Plan:   Problem List Items Addressed This Visit       Cardiovascular and Mediastinum   HTN (hypertension) - Primary   Relevant Medications   atorvastatin (LIPITOR) 40 MG tablet     Respiratory   COPD (chronic obstructive pulmonary disease) (HCC)     Other   Depression, recurrent (HCC)   Relevant Medications   DULoxetine (CYMBALTA) 30 MG capsule   Hyperlipidemia, mixed   Relevant Medications   atorvastatin (LIPITOR) 40 MG tablet   History of CVA (cerebrovascular accident)   Relevant Medications   clopidogrel (PLAVIX) 75 MG tablet  Minimal speech, his wife does most the talking for him because he does not talk well after strokes.  Will try Cymbalta to see if it can help with his depression  Follow up plan: Return in about 6 months (around 04/09/2021), or if symptoms worsen or fail to improve, for Depression and hypertension and hyperlipidemia follow-up.  Counseling provided for all of the vaccine components No orders of the defined types  were placed in this encounter.   Caryl Pina, MD [facility] 10/07/2020, 4:40 PM

## 2020-10-07 NOTE — Addendum Note (Signed)
Addended by: Caryl Pina on: 10/07/2020 04:42 PM   Modules accepted: Orders

## 2020-10-07 NOTE — Addendum Note (Signed)
Addended by: Alphonzo Dublin on: 10/07/2020 04:46 PM   Modules accepted: Orders

## 2020-10-08 LAB — CMP14+EGFR
ALT: 8 IU/L (ref 0–44)
AST: 8 IU/L (ref 0–40)
Albumin/Globulin Ratio: 1.3 (ref 1.2–2.2)
Albumin: 4.2 g/dL (ref 3.7–4.7)
Alkaline Phosphatase: 124 IU/L — ABNORMAL HIGH (ref 44–121)
BUN/Creatinine Ratio: 10 (ref 10–24)
BUN: 9 mg/dL (ref 8–27)
Bilirubin Total: 0.4 mg/dL (ref 0.0–1.2)
CO2: 23 mmol/L (ref 20–29)
Calcium: 9.6 mg/dL (ref 8.6–10.2)
Chloride: 104 mmol/L (ref 96–106)
Creatinine, Ser: 0.92 mg/dL (ref 0.76–1.27)
Globulin, Total: 3.3 g/dL (ref 1.5–4.5)
Glucose: 105 mg/dL — ABNORMAL HIGH (ref 65–99)
Potassium: 4.2 mmol/L (ref 3.5–5.2)
Sodium: 141 mmol/L (ref 134–144)
Total Protein: 7.5 g/dL (ref 6.0–8.5)
eGFR: 88 mL/min/{1.73_m2} (ref >59)

## 2020-10-08 LAB — CBC WITH DIFFERENTIAL/PLATELET
Basophils Absolute: 0.1 10*3/uL (ref 0.0–0.2)
Basos: 1 %
EOS (ABSOLUTE): 0.2 10*3/uL (ref 0.0–0.4)
Eos: 2 %
Hematocrit: 46 % (ref 37.5–51.0)
Hemoglobin: 15.5 g/dL (ref 13.0–17.7)
Immature Grans (Abs): 0.1 10*3/uL (ref 0.0–0.1)
Immature Granulocytes: 1 %
Lymphocytes Absolute: 2.6 10*3/uL (ref 0.7–3.1)
Lymphs: 26 %
MCH: 29.5 pg (ref 26.6–33.0)
MCHC: 33.7 g/dL (ref 31.5–35.7)
MCV: 88 fL (ref 79–97)
Monocytes Absolute: 1.3 10*3/uL — ABNORMAL HIGH (ref 0.1–0.9)
Monocytes: 12 %
Neutrophils Absolute: 6 10*3/uL (ref 1.4–7.0)
Neutrophils: 58 %
Platelets: 312 10*3/uL (ref 150–450)
RBC: 5.25 x10E6/uL (ref 4.14–5.80)
RDW: 12.7 % (ref 11.6–15.4)
WBC: 10.2 10*3/uL (ref 3.4–10.8)

## 2020-10-08 LAB — LIPID PANEL
Chol/HDL Ratio: 4.9 ratio (ref 0.0–5.0)
Cholesterol, Total: 138 mg/dL (ref 100–199)
HDL: 28 mg/dL — ABNORMAL LOW (ref >39)
LDL Chol Calc (NIH): 78 mg/dL (ref 0–99)
Triglycerides: 184 mg/dL — ABNORMAL HIGH (ref 0–149)
VLDL Cholesterol Cal: 32 mg/dL (ref 5–40)

## 2020-10-13 NOTE — Progress Notes (Signed)
Pt r/c.

## 2020-10-14 ENCOUNTER — Other Ambulatory Visit: Payer: Self-pay | Admitting: Family Medicine

## 2020-10-14 DIAGNOSIS — E782 Mixed hyperlipidemia: Secondary | ICD-10-CM

## 2020-11-12 ENCOUNTER — Telehealth: Payer: Medicare HMO

## 2020-11-16 DIAGNOSIS — C44229 Squamous cell carcinoma of skin of left ear and external auricular canal: Secondary | ICD-10-CM | POA: Diagnosis not present

## 2020-11-16 DIAGNOSIS — Z85828 Personal history of other malignant neoplasm of skin: Secondary | ICD-10-CM | POA: Diagnosis not present

## 2020-11-16 DIAGNOSIS — C44319 Basal cell carcinoma of skin of other parts of face: Secondary | ICD-10-CM | POA: Diagnosis not present

## 2020-11-16 DIAGNOSIS — D225 Melanocytic nevi of trunk: Secondary | ICD-10-CM | POA: Diagnosis not present

## 2020-11-16 DIAGNOSIS — Z08 Encounter for follow-up examination after completed treatment for malignant neoplasm: Secondary | ICD-10-CM | POA: Diagnosis not present

## 2020-11-20 ENCOUNTER — Other Ambulatory Visit: Payer: Self-pay | Admitting: Family Medicine

## 2020-11-20 DIAGNOSIS — E782 Mixed hyperlipidemia: Secondary | ICD-10-CM

## 2020-11-26 DIAGNOSIS — I6381 Other cerebral infarction due to occlusion or stenosis of small artery: Secondary | ICD-10-CM | POA: Diagnosis not present

## 2020-11-26 DIAGNOSIS — G9389 Other specified disorders of brain: Secondary | ICD-10-CM | POA: Diagnosis not present

## 2020-11-26 DIAGNOSIS — Z79899 Other long term (current) drug therapy: Secondary | ICD-10-CM | POA: Diagnosis not present

## 2020-11-26 DIAGNOSIS — E785 Hyperlipidemia, unspecified: Secondary | ICD-10-CM | POA: Diagnosis not present

## 2020-11-26 DIAGNOSIS — W19XXXA Unspecified fall, initial encounter: Secondary | ICD-10-CM | POA: Diagnosis not present

## 2020-11-26 DIAGNOSIS — F17210 Nicotine dependence, cigarettes, uncomplicated: Secondary | ICD-10-CM | POA: Diagnosis not present

## 2020-11-26 DIAGNOSIS — R531 Weakness: Secondary | ICD-10-CM | POA: Diagnosis not present

## 2020-11-26 DIAGNOSIS — I959 Hypotension, unspecified: Secondary | ICD-10-CM | POA: Diagnosis not present

## 2020-11-26 DIAGNOSIS — N390 Urinary tract infection, site not specified: Secondary | ICD-10-CM | POA: Diagnosis not present

## 2020-11-26 DIAGNOSIS — R197 Diarrhea, unspecified: Secondary | ICD-10-CM | POA: Diagnosis not present

## 2020-11-26 DIAGNOSIS — I10 Essential (primary) hypertension: Secondary | ICD-10-CM | POA: Diagnosis not present

## 2020-11-26 DIAGNOSIS — F32A Depression, unspecified: Secondary | ICD-10-CM | POA: Diagnosis not present

## 2020-11-26 DIAGNOSIS — I739 Peripheral vascular disease, unspecified: Secondary | ICD-10-CM | POA: Diagnosis not present

## 2020-11-26 DIAGNOSIS — Z043 Encounter for examination and observation following other accident: Secondary | ICD-10-CM | POA: Diagnosis not present

## 2020-11-26 DIAGNOSIS — R9082 White matter disease, unspecified: Secondary | ICD-10-CM | POA: Diagnosis not present

## 2020-11-27 ENCOUNTER — Telehealth: Payer: Self-pay | Admitting: Family Medicine

## 2020-11-27 ENCOUNTER — Ambulatory Visit (INDEPENDENT_AMBULATORY_CARE_PROVIDER_SITE_OTHER): Payer: Medicare HMO | Admitting: Licensed Clinical Social Worker

## 2020-11-27 DIAGNOSIS — F339 Major depressive disorder, recurrent, unspecified: Secondary | ICD-10-CM | POA: Diagnosis not present

## 2020-11-27 DIAGNOSIS — Z8673 Personal history of transient ischemic attack (TIA), and cerebral infarction without residual deficits: Secondary | ICD-10-CM

## 2020-11-27 DIAGNOSIS — R531 Weakness: Secondary | ICD-10-CM

## 2020-11-27 DIAGNOSIS — E782 Mixed hyperlipidemia: Secondary | ICD-10-CM | POA: Diagnosis not present

## 2020-11-27 DIAGNOSIS — I10 Essential (primary) hypertension: Secondary | ICD-10-CM

## 2020-11-27 DIAGNOSIS — J449 Chronic obstructive pulmonary disease, unspecified: Secondary | ICD-10-CM

## 2020-11-27 DIAGNOSIS — Z720 Tobacco use: Secondary | ICD-10-CM

## 2020-11-27 DIAGNOSIS — N400 Enlarged prostate without lower urinary tract symptoms: Secondary | ICD-10-CM

## 2020-11-27 NOTE — Patient Instructions (Signed)
Visit Information  PATIENT GOALS:  Goals Addressed               This Visit's Progress     Manage Depression and Depression symptoms (pt-stated)         Timeframe : Short Term Goal  This  Visit's Progress : On Track   Priority: Medium   Start Date : 11/27/20   Expected End Date 02/24/21   Completed Date   Follow Up Date:  01/06/21  Manage Depression and Depression Symptoms  Patient Self Care Activities:  Eats meals with set up Communicates regularly with spouse, [name]  Patient Coping Strengths:  Communicates regularly with spouse, [name] Completes ADLs  Patient Self Care Deficits:  Depression symptoms Mobility issues  Patient Goals:  - spend time or talk with others every day - practice relaxation or meditation daily - keep a calendar with appointment dates  Follow Up Plan: LCSW to call client or spouse of client on 01/06/21 to assess client needs            [name].Akesha Uresti MSW, LCSW Licensed Clinical Social Worker [facility] [phone]

## 2020-11-27 NOTE — Telephone Encounter (Signed)
Spoke with patient's wife and scheduled appointment on 12/04/20 with Dr. Warrick Parisian so that [facility] forms can be completed.

## 2020-11-27 NOTE — Telephone Encounter (Signed)
Social Worker [employer] called to let us know that patients family is trying to get an FL2 filled out for patient because patients health has declined and the family member who normally helps take care of patient also has some health issues and is unable to help patient.  Wants FL2 to go to [facility] in [place] or maybe [facility] and [place].  Can call [name] or patients family for questions.

## 2020-11-27 NOTE — Chronic Care Management (AMB) (Signed)
Chronic Care Management    Clinical Social Work Note  11/27/2020 Name: Walter [name] MRN: [id number] DOB: Aug 04, 1950  Walter [name] is a 70 y.o. year old male who is a primary care patient of Walter [name], Walter [name], Walter [name]. Walter CCM team was consulted to assist Walter patient with chronic disease management and/or care coordination needs related to: [name] .   Engaged with patient / spouse of patient, Walter [name], by telephone for follow up visit in response to provider referral for social work chronic care management and care coordination services.   Consent to Services:  Walter patient was given information about Chronic Care Management services, agreed to services, and gave verbal consent prior to initiation of services.  Please see initial visit note for detailed documentation.   Patient agreed to services and consent obtained.   Assessment: Review of patient past medical history, allergies, medications, and health status, including review of relevant consultants reports was performed today as part of a comprehensive evaluation and provision of chronic care management and care coordination services.     SDOH (Social Determinants of Health) assessments and interventions performed:  SDOH Interventions    Flowsheet Row Most Recent Value  SDOH Interventions   Physical Activity Interventions Other (Comments)  [client uses a walker to help him walk. Client is at risk for falls]  Depression Interventions/Treatment  Currently on Treatment        Advanced Directives Status: See Vynca application for related entries.  CCM Care Plan  No Known Allergies  Outpatient Encounter Medications as of 11/27/2020  Medication Sig   acetaminophen (TYLENOL) 325 MG tablet Take 1-2 tablets (325-650 mg total) by mouth every 6 (six) hours as needed for mild pain (pain score 1-3).   amLODipine (NORVASC) 5 MG tablet Take 1 tablet (5 mg total) by mouth daily. (NEEDS TO BE SEEN BEFORE NEXT REFILL)   aspirin  EC 81 MG tablet Take 81 mg by mouth daily. Swallow whole.   atorvastatin (LIPITOR) 40 MG tablet Take 1 tablet (40 mg total) by mouth daily.   clopidogrel (PLAVIX) 75 MG tablet Take 1 tablet (75 mg total) by mouth daily.   DULoxetine (CYMBALTA) 30 MG capsule Take 1 capsule (30 mg total) by mouth at bedtime.   ferrous sulfate 325 (65 FE) MG tablet Take 1 tablet (325 mg total) by mouth 2 (two) times daily with a meal.   Multiple Vitamins-Minerals (ONE-A-DAY 50 PLUS PO) Take by mouth daily.   polyethylene glycol (MIRALAX / GLYCOLAX) 17 g packet Take 17 g by mouth daily as needed for mild constipation.   tamsulosin (FLOMAX) 0.4 MG CAPS capsule Take 1 capsule (0.4 mg total) by mouth daily after breakfast.   No facility-administered encounter medications on file as of 11/27/2020.    Patient Active Problem List   Diagnosis Date Noted   S/P left THA, AA 09/09/2019   DNR (do not resuscitate) 09/08/2019   Hip fracture, left, closed, initial encounter (Walter [name]) 09/07/2019   Depression, recurrent (Walter [name]) 04/06/2018   Hyperlipidemia, mixed 04/06/2018   HTN (hypertension) 01/12/2015   BPH (benign prostatic hyperplasia) 01/12/2015   Nodule of kidney 02/14/2014   Weakness 02/11/2014   Recurrent colitis due to Clostridium difficile 02/11/2014   Occlusion and stenosis of carotid artery without mention of cerebral infarction 09/07/2012   History of CVA (cerebrovascular accident) 02/18/2012   Tobacco abuse 02/18/2012   Non-compliance 02/18/2012   COPD (chronic obstructive pulmonary disease) (Walter [name]) 02/18/2012    Conditions to be addressed/monitored: monitor client  management of depression issues   Care Plan : Walter [name] care plan  Updates made by Walter [name], Walter [name] since 11/27/2020 12:00 AM     Problem: Depression Identification (Depression)      Goal: Manage depression and depression symptoms   Start Date: 11/27/2020  Expected End Date: 02/24/2021  This Visit's Progress: On track  Recent Progress: On  track  Priority: Medium  Note:   Current Barriers:  Managing Depression symptoms is challenging for client Mobility issues Suicidal Ideation/Homicidal Ideation: No  Clinical Social Work Goal(s):  patient will work with SW monthly by telephone or in person to reduce or manage symptoms related to depression and depression symptoms Client to attend scheduled medical appointments in next 30 days Patient or spouse of patient will call RNCM as needed in next 30 days for nursing support for client  Interventions:  1:1 collaboration with Walter [name], Walter [name] regarding development and update of comprehensive plan of care as evidenced by provider attestation and co-signature. Talked with Walter [name], spouse of client about client needs Talked with Walter [name] about recent client falls Talked with Walter [name] about sleeping issues of client Talked with Walter [name] about appetite of client Talked with Walter [name] about client  DME (has 3 in 1 bedside commode,, has a standard walker and has a rolling walker) Encouraged client or Walter [name] to call RNCM as needed for nursing support for client Talked with Walter [name] about mobility of client (uses a cane or a walker to help him walk) Talked with Walter [name] about client recent visit to Walter related to a fall.  Talked with Walter [name] about PT recommendations for SNF placement for client or in home physical therapy support for client and home health aide support for client. Walter [name] said client fell again this AM but had no serious injuries.  Walter [name] was anxious related to demands of caring for her spouse who has multiple medical and functional needs at present. Walter [name] talked with Walter [name] about FL-2 completion through Walter [name] and faxing of completed FL-2 to SNFs in area. Walter [name] agreed to this plan. Walter [name] called Walter [name] at Walter [name] and informed her of information. Walter [name] said she would send request for FL-2 completion to Walter [name], Walter [name] at Walter [name] today and see if Walter [name] could facilitate completion of  FL-2. Walter [name] had asked Walter [name] if practice could please fax completed FL-2 to [facility] and to [facility] [name].  Walter [name] also sent this request to [name] today.  Walter [name] later called Walter [name] again and she said that if they have a choice they would prefer [facility] for care for Walter [name] since it is close to their home  Walter [name] and Walter [name] spoke of client use of Lift Chair and client challenges in getting out of bed safely.  Walter [name] said client sometimes does not use rolling walker or forgets to use ambulation assistive devices Talked with Walter [name] about family support for client. Walter [name] has talked previously with Walter [name] about ADTS support for client Walter [name] encouraged Walter [name] to call RNCM or Walter [name] as needed for support in managing care needs of client  Patient Self Care Activities:  Eats meals with set up Communicates regularly with spouse, Walter [name]  Patient Coping Strengths:  Communicates regularly with spouse, Walter [name] Completes ADLs  Patient Self Care Deficits:  Depression symptoms Mobility issues At risk for falls  Patient Goals:  - spend time or talk with others every day - practice relaxation or meditation daily - keep a calendar with appointment dates  Follow Up Plan:  Walter [name] to call client or spouse of client on 01/06/21 to assess client needs     [name].Amer Alcindor MSW, Walter [name] Licensed Clinical Social Worker [facility] Care Management [phone]

## 2020-12-04 ENCOUNTER — Other Ambulatory Visit: Payer: Self-pay

## 2020-12-04 ENCOUNTER — Ambulatory Visit (INDEPENDENT_AMBULATORY_CARE_PROVIDER_SITE_OTHER): Payer: Medicare HMO | Admitting: Family Medicine

## 2020-12-04 ENCOUNTER — Encounter: Payer: Self-pay | Admitting: Family Medicine

## 2020-12-04 VITALS — BP 132/77 | HR 65 | Ht 73.0 in | Wt 172.0 lb

## 2020-12-04 DIAGNOSIS — R29898 Other symptoms and signs involving the musculoskeletal system: Secondary | ICD-10-CM

## 2020-12-04 DIAGNOSIS — Z8673 Personal history of transient ischemic attack (TIA), and cerebral infarction without residual deficits: Secondary | ICD-10-CM | POA: Diagnosis not present

## 2020-12-04 DIAGNOSIS — R531 Weakness: Secondary | ICD-10-CM | POA: Diagnosis not present

## 2020-12-04 DIAGNOSIS — F0150 Vascular dementia without behavioral disturbance: Secondary | ICD-10-CM | POA: Diagnosis not present

## 2020-12-04 DIAGNOSIS — R482 Apraxia: Secondary | ICD-10-CM | POA: Insufficient documentation

## 2020-12-04 NOTE — Progress Notes (Signed)
BP 132/77   Pulse 65   Ht '6\' 1"'$  (1.854 m)   Wt 172 lb (78 kg)   SpO2 96%   BMI 22.69 kg/m    Subjective:   Patient ID: Walter [name], male    DOB: 01-18-1951, 70 y.o.   MRN: [id number]  HPI: Walter [name] is a 70 y.o. male presenting on 12/04/2020 for No chief complaint on file.   HPI Patient is coming in today for to discuss placement for physical therapy and weakness and fall risk.  His wife is having more more trouble taking care of him.  After his stroke he has become progressively weaker, he can still use a walker but is very unstable on the walker and is having more more troubles with walking especially with his right leg.  He can be verbal but is minimally verbal because of his stroke.  His memory has been affected by the stroke as well and he is able to answer questions and communicate but does sometimes have trouble processing and remembering things.  Was in the hospital last week for dehydration and UTI and is on the antibiotic and is recovering.  Relevant past medical, surgical, family and social history reviewed and updated as indicated. Interim medical history since our last visit reviewed. Allergies and medications reviewed and updated.  Review of Systems  Constitutional:  Negative for chills and fever.  Eyes:  Negative for visual disturbance.  Respiratory:  Negative for shortness of breath and wheezing.   Cardiovascular:  Negative for chest pain and leg swelling.  Musculoskeletal:  Positive for gait problem. Negative for back pain.  Skin:  Negative for rash.  Neurological:  Positive for speech difficulty and weakness. Negative for light-headedness, numbness and headaches.  Psychiatric/Behavioral:  Positive for confusion. Negative for dysphoric mood, self-injury, sleep disturbance and suicidal ideas. The patient is not nervous/anxious.   All other systems reviewed and are negative.  Per HPI unless specifically indicated above   Allergies as of 12/04/2020   No Known  Allergies      Medication List        Accurate as of December 04, 2020 11:13 AM. If you have any questions, ask your nurse or doctor.          acetaminophen 325 MG tablet Commonly known as: TYLENOL Take 1-2 tablets (325-650 mg total) by mouth every 6 (six) hours as needed for mild pain (pain score 1-3).   amLODipine 5 MG tablet Commonly known as: NORVASC Take 1 tablet (5 mg total) by mouth daily. (NEEDS TO BE SEEN BEFORE NEXT REFILL)   aspirin EC 81 MG tablet Take 81 mg by mouth daily. Swallow whole.   atorvastatin 40 MG tablet Commonly known as: LIPITOR Take 1 tablet (40 mg total) by mouth daily.   cephALEXin 500 MG capsule Commonly known as: KEFLEX Take 1 capsule by mouth 3 (three) times daily.   clopidogrel 75 MG tablet Commonly known as: PLAVIX Take 1 tablet (75 mg total) by mouth daily.   DULoxetine 30 MG capsule Commonly known as: Cymbalta Take 1 capsule (30 mg total) by mouth at bedtime.   ferrous sulfate 325 (65 FE) MG tablet Take 1 tablet (325 mg total) by mouth 2 (two) times daily with a meal.   ONE-A-DAY 50 PLUS PO Take by mouth daily.   polyethylene glycol 17 g packet Commonly known as: MIRALAX / GLYCOLAX Take 17 g by mouth daily as needed for mild constipation.   tamsulosin 0.4 MG Caps  capsule Commonly known as: FLOMAX Take 1 capsule (0.4 mg total) by mouth daily after breakfast.         Objective:   BP 132/77   Pulse 65   Ht '6\' 1"'$  (1.854 m)   Wt 172 lb (78 kg)   SpO2 96%   BMI 22.69 kg/m   Wt Readings from Last 3 Encounters:  12/04/20 172 lb (78 kg)  10/07/20 172 lb (78 kg)  08/21/20 173 lb (78.5 kg)    Physical Exam Vitals and nursing note reviewed.  Constitutional:      General: He is not in acute distress.    Appearance: He is well-developed. He is not diaphoretic.  Eyes:     General: No scleral icterus.    Conjunctiva/sclera: Conjunctivae normal.  Neck:     Thyroid: No thyromegaly.  Cardiovascular:     Rate and  Rhythm: Normal rate and regular rhythm.     Heart sounds: Normal heart sounds. No murmur heard. Pulmonary:     Effort: Pulmonary effort is normal. No respiratory distress.     Breath sounds: Normal breath sounds. No wheezing.  Musculoskeletal:        General: Normal range of motion.     Cervical back: Neck supple.  Lymphadenopathy:     Cervical: No cervical adenopathy.  Skin:    General: Skin is warm and dry.     Findings: No rash.  Neurological:     Mental Status: He is alert. Mental status is at baseline. He is confused.     Motor: Weakness present.     Coordination: Coordination normal.     Gait: Gait abnormal.  Psychiatric:        Behavior: Behavior normal.        Cognition and Memory: He exhibits impaired recent memory.      Assessment & Plan:   Problem List Items Addressed This Visit       Nervous and Auditory   Vascular dementia ([place])     Other   Weakness   Speech apraxia   History of CVA (cerebrovascular accident) - Primary   Other Visit Diagnoses     Weakness of both lower extremities           His weakness is getting worse, he has not been as mobile and he is barely able to use his walker now.  He has become more unsteady and high risk for falls  Patient and his wife would like to do rehab and physical therapy, have been referred for home physical therapy but she almost would rather him go to a facility so he gets more extensive physical therapy on more days to get his strength back.  They would like to discuss [facility] as an option. Follow up plan: Return if symptoms worsen or fail to improve.  Counseling provided for all of the vaccine components No orders of the defined types were placed in this encounter.   Caryl Pina, MD [facility] 12/04/2020, 11:13 AM

## 2020-12-11 DIAGNOSIS — R197 Diarrhea, unspecified: Secondary | ICD-10-CM | POA: Diagnosis not present

## 2020-12-11 DIAGNOSIS — F32A Depression, unspecified: Secondary | ICD-10-CM | POA: Diagnosis not present

## 2020-12-11 DIAGNOSIS — I10 Essential (primary) hypertension: Secondary | ICD-10-CM | POA: Diagnosis not present

## 2020-12-11 DIAGNOSIS — Z7902 Long term (current) use of antithrombotics/antiplatelets: Secondary | ICD-10-CM | POA: Diagnosis not present

## 2020-12-11 DIAGNOSIS — E785 Hyperlipidemia, unspecified: Secondary | ICD-10-CM | POA: Diagnosis not present

## 2020-12-11 DIAGNOSIS — K5900 Constipation, unspecified: Secondary | ICD-10-CM | POA: Diagnosis not present

## 2020-12-11 DIAGNOSIS — N390 Urinary tract infection, site not specified: Secondary | ICD-10-CM | POA: Diagnosis not present

## 2020-12-11 DIAGNOSIS — F17210 Nicotine dependence, cigarettes, uncomplicated: Secondary | ICD-10-CM | POA: Diagnosis not present

## 2020-12-11 DIAGNOSIS — J449 Chronic obstructive pulmonary disease, unspecified: Secondary | ICD-10-CM | POA: Diagnosis not present

## 2020-12-14 DIAGNOSIS — J449 Chronic obstructive pulmonary disease, unspecified: Secondary | ICD-10-CM | POA: Diagnosis not present

## 2020-12-14 DIAGNOSIS — F32A Depression, unspecified: Secondary | ICD-10-CM | POA: Diagnosis not present

## 2020-12-14 DIAGNOSIS — K5900 Constipation, unspecified: Secondary | ICD-10-CM | POA: Diagnosis not present

## 2020-12-14 DIAGNOSIS — N390 Urinary tract infection, site not specified: Secondary | ICD-10-CM | POA: Diagnosis not present

## 2020-12-14 DIAGNOSIS — F17210 Nicotine dependence, cigarettes, uncomplicated: Secondary | ICD-10-CM | POA: Diagnosis not present

## 2020-12-14 DIAGNOSIS — Z7902 Long term (current) use of antithrombotics/antiplatelets: Secondary | ICD-10-CM | POA: Diagnosis not present

## 2020-12-14 DIAGNOSIS — I10 Essential (primary) hypertension: Secondary | ICD-10-CM | POA: Diagnosis not present

## 2020-12-14 DIAGNOSIS — R197 Diarrhea, unspecified: Secondary | ICD-10-CM | POA: Diagnosis not present

## 2020-12-14 DIAGNOSIS — E785 Hyperlipidemia, unspecified: Secondary | ICD-10-CM | POA: Diagnosis not present

## 2020-12-16 ENCOUNTER — Other Ambulatory Visit: Payer: Self-pay

## 2020-12-16 ENCOUNTER — Encounter: Payer: Self-pay | Admitting: Nurse Practitioner

## 2020-12-16 ENCOUNTER — Ambulatory Visit (INDEPENDENT_AMBULATORY_CARE_PROVIDER_SITE_OTHER): Payer: Medicare HMO

## 2020-12-16 ENCOUNTER — Telehealth: Payer: Self-pay | Admitting: Family Medicine

## 2020-12-16 ENCOUNTER — Ambulatory Visit (INDEPENDENT_AMBULATORY_CARE_PROVIDER_SITE_OTHER): Payer: Medicare HMO | Admitting: Nurse Practitioner

## 2020-12-16 VITALS — BP 138/78 | HR 71 | Temp 97.7°F | Ht 73.0 in | Wt 180.0 lb

## 2020-12-16 DIAGNOSIS — R1084 Generalized abdominal pain: Secondary | ICD-10-CM

## 2020-12-16 DIAGNOSIS — K5900 Constipation, unspecified: Secondary | ICD-10-CM | POA: Diagnosis not present

## 2020-12-16 DIAGNOSIS — R109 Unspecified abdominal pain: Secondary | ICD-10-CM | POA: Diagnosis not present

## 2020-12-16 NOTE — Telephone Encounter (Signed)
FYI and provider aware.

## 2020-12-16 NOTE — Progress Notes (Signed)
Acute Office Visit  Subjective:    Patient ID: Walter [name], male    DOB: March 04, 1951, 70 y.o.   MRN: [id number]  Chief Complaint  Patient presents with   Constipation    Constipation  Patient is in today for Constipation: Patient complains of constipation.  Stool pattern has been: none.  stool(s) per week. Onset was 8 days ago Defecation has been  none . Co-Morbid conditions:dietary change no water, fiber, vegetable or fruits . Symptoms have been gradually worsening. Current Health Habits: Eating fiber? no Exercise?no Water intake? no Current OTC/RX therapy has been  enema  which has been ineffective.   Past Medical History:  Diagnosis Date   Acute urinary retention 02/12/2014   Brain TIA    recurrent    C. difficile enteritis    Carotid bruit    COPD (chronic obstructive pulmonary disease) (HCC)    Coronary artery disease    Myocardial infarction ([place])    incidental   Nodule of kidney 02/14/14   solid on left   Recurrent colitis due to Clostridium difficile 02/11/2014   Stroke ([place]) 1997    Past Surgical History:  Procedure Laterality Date   BACK SURGERY     HIP ARTHROPLASTY Left 09/09/2019   Procedure: ARTHROPLASTY BIPOLAR HIP (HEMIARTHROPLASTY);  Surgeon: Paralee Cancel, MD;  Location: [facility];  Service: Orthopedics;  Laterality: Left;   KNEE SURGERY      Family History  Problem Relation Age of Onset   Cancer Father 41       stomach   Coronary artery disease Mother 9   Stroke Mother    Coronary artery disease Brother 24       AMI deceased   Heart attack Daughter     Social History   Socioeconomic History   Marital status: Married    Spouse name: Not on file   Number of children: 1   Years of education: 10   Highest education level: 10th grade  Occupational History   Occupation: truck Geophysicist/field seismologist    Comment: unable becasue of CVA   Occupation: disability    Comment: since 2000  Tobacco Use   Smoking status: Former    Packs/day: 1.00    Years: 55.00     Pack years: 55.00    Types: Cigarettes   Smokeless tobacco: Never  Vaping Use   Vaping Use: Never used  Substance and Sexual Activity   Alcohol use: No   Drug use: No   Sexual activity: Not Currently  Other Topics Concern   Not on file  Social History Narrative   One level living and handicap accessible bathroom - had stroke in 2021 - speech impaired, impaired mobility   Social Determinants of Health   Financial Resource Strain: Low Risk    Difficulty of Paying Living Expenses: Not hard at all  Food Insecurity: No Food Insecurity   Worried About Charity fundraiser in the Last Year: Never true   [place] in the Last Year: Never true  Transportation Needs: No Transportation Needs   Lack of Transportation (Medical): No   Lack of Transportation (Non-Medical): No  Physical Activity: Insufficiently Active   Days of Exercise per Week: 1 day   Minutes of Exercise per Session: 10 min  Stress: No Stress Concern Present   Feeling of Stress : Not at all  Social Connections: Moderately Isolated   Frequency of Communication with Friends and Family: More than three times a week   Frequency  of Social Gatherings with Friends and Family: More than three times a week   Attends Religious Services: Never   Marine scientist or Organizations: No   Attends Music therapist: Never   Marital Status: Married  Human resources officer Violence: Not At Risk   Fear of Current or Ex-Partner: No   Emotionally Abused: No   Physically Abused: No   Sexually Abused: No    Outpatient Medications Prior to Visit  Medication Sig Dispense Refill   acetaminophen (TYLENOL) 325 MG tablet Take 1-2 tablets (325-650 mg total) by mouth every 6 (six) hours as needed for mild pain (pain score 1-3).     amLODipine (NORVASC) 5 MG tablet Take 1 tablet (5 mg total) by mouth daily. (NEEDS TO BE SEEN BEFORE NEXT REFILL) 90 tablet 3   aspirin EC 81 MG tablet Take 81 mg by mouth daily. Swallow whole.      atorvastatin (LIPITOR) 40 MG tablet Take 1 tablet (40 mg total) by mouth daily. 90 tablet 3   clopidogrel (PLAVIX) 75 MG tablet Take 1 tablet (75 mg total) by mouth daily. 90 tablet 3   DULoxetine (CYMBALTA) 30 MG capsule Take 1 capsule (30 mg total) by mouth at bedtime. 90 capsule 3   ferrous sulfate 325 (65 FE) MG tablet Take 1 tablet (325 mg total) by mouth 2 (two) times daily with a meal.  3   Multiple Vitamins-Minerals (ONE-A-DAY 50 PLUS PO) Take by mouth daily.     polyethylene glycol (MIRALAX / GLYCOLAX) 17 g packet Take 17 g by mouth daily as needed for mild constipation. 14 each 0   tamsulosin (FLOMAX) 0.4 MG CAPS capsule Take 1 capsule (0.4 mg total) by mouth daily after breakfast. 30 capsule 0   No facility-administered medications prior to visit.      Review of Systems  Constitutional: Negative.   HENT: Negative.    Eyes: Negative.   Respiratory: Negative.    Gastrointestinal:  Positive for constipation.  Skin:  Negative for rash.  All other systems reviewed and are negative.     Objective:    Physical Exam Vitals and nursing note reviewed.  Constitutional:      Appearance: Normal appearance.  HENT:     Head: Normocephalic.     Mouth/Throat:     Mouth: Mucous membranes are moist.     Pharynx: Oropharynx is clear.  Eyes:     Conjunctiva/sclera: Conjunctivae normal.  Cardiovascular:     Rate and Rhythm: Normal rate and regular rhythm.  Pulmonary:     Effort: Pulmonary effort is normal.     Breath sounds: Normal breath sounds.  Abdominal:     General: Abdomen is flat. Bowel sounds are normal.     Palpations: Abdomen is soft.     Tenderness: There is no abdominal tenderness.  Skin:    Findings: No rash.  Neurological:     Mental Status: He is alert and oriented to person, place, and time.    BP 138/78   Pulse 71   Temp 97.7 F (36.5 C) (Temporal)   Ht _0  (1.854 m)   Wt 180 lb (81.6 kg)   SpO2 99%   BMI 23.75 kg/m  Wt Readings from Last 3  Encounters:  12/16/20 180 lb (81.6 kg)  12/04/20 172 lb (78 kg)  10/07/20 172 lb (78 kg)    There are no preventive care reminders to display for this patient.  There are no preventive care reminders to display  for this patient.   Lab Results  Component Value Date   TSH 3.860 08/15/2017   Lab Results  Component Value Date   WBC 10.2 10/07/2020   HGB 15.5 10/07/2020   HCT 46.0 10/07/2020   MCV 88 10/07/2020   PLT 312 10/07/2020   Lab Results  Component Value Date   NA 141 10/07/2020   K 4.2 10/07/2020   CO2 23 10/07/2020   GLUCOSE 105 (H) 10/07/2020   BUN 9 10/07/2020   CREATININE 0.92 10/07/2020   BILITOT 0.4 10/07/2020   ALKPHOS 124 (H) 10/07/2020   AST 8 10/07/2020   ALT 8 10/07/2020   PROT 7.5 10/07/2020   ALBUMIN 4.2 10/07/2020   CALCIUM 9.6 10/07/2020   ANIONGAP 9 09/14/2019   EGFR 88 10/07/2020   Lab Results  Component Value Date   CHOL 138 10/07/2020   Lab Results  Component Value Date   HDL 28 (L) 10/07/2020   Lab Results  Component Value Date   LDLCALC 78 10/07/2020   Lab Results  Component Value Date   TRIG 184 (H) 10/07/2020   Lab Results  Component Value Date   CHOLHDL 4.9 10/07/2020   Lab Results  Component Value Date   HGBA1C 5.8 (H) 02/18/2012       Assessment & Plan:   Problem List Items Addressed This Visit       Other   Generalized abdominal pain - Primary    No bowel movement in 8 days.  Completed KUB results pending.  Provided education to patient to increase hydration, fiber and fruits in diet. No fever, chills, nausea associated with symptoms. Active bowel sounds.  Follow-up with worsening unresolved symptoms      Relevant Orders   DG Abd 1 View     No orders of the defined types were placed in this encounter.    Ivy Lynn, NP

## 2020-12-16 NOTE — Assessment & Plan Note (Signed)
No bowel movement in 8 days.  Completed KUB results pending.  Provided education to patient to increase hydration, fiber and fruits in diet. No fever, chills, nausea associated with symptoms. Active bowel sounds.  Follow-up with worsening unresolved symptoms

## 2020-12-16 NOTE — Patient Instructions (Signed)
Constipation, Adult Constipation is when a person has fewer than three bowel movements in a week, has difficulty having a bowel movement, or has stools (feces) that are dry, hard, or larger than normal. Constipation may be caused by an underlying condition. It may become worse with age if a person takes certain medicines and does not take in enough fluids. Follow these instructions at home: Eating and drinking  Eat foods that have a lot of fiber, such as beans, whole grains, and fresh fruits and vegetables. Limit foods that are low in fiber and high in fat and processed sugars, such as fried or sweet foods. These include french fries, hamburgers, cookies, candies, and soda. Drink enough fluid to keep your urine pale yellow. General instructions Exercise regularly or as told by your health care provider. Try to do 150 minutes of moderate exercise each week. Use the bathroom when you have the urge to go. Do not hold it in. Take over-the-counter and prescription medicines only as told by your health care provider. This includes any fiber supplements. During bowel movements: Practice deep breathing while relaxing the lower abdomen. Practice pelvic floor relaxation. Watch your condition for any changes. Let your health care provider know about them. Keep all follow-up visits as told by your health care provider. This is important. Contact a health care provider if: You have pain that gets worse. You have a fever. You do not have a bowel movement after 4 days. You vomit. You are not hungry or you lose weight. You are bleeding from the opening between the buttocks (anus). You have thin, pencil-like stools. Get help right away if: You have a fever and your symptoms suddenly get worse. You leak stool or have blood in your stool. Your abdomen is bloated. You have severe pain in your abdomen. You feel dizzy or you faint. Summary Constipation is when a person has fewer than three bowel movements  in a week, has difficulty having a bowel movement, or has stools (feces) that are dry, hard, or larger than normal. Eat foods that have a lot of fiber, such as beans, whole grains, and fresh fruits and vegetables. Drink enough fluid to keep your urine pale yellow. Take over-the-counter and prescription medicines only as told by your health care provider. This includes any fiber supplements. This information is not intended to replace advice given to you by your health care provider. Make sure you discuss any questions you have with your health care provider. Document Revised: 02/06/2019 Document Reviewed: 02/06/2019 Elsevier Patient Education  [place]. Abdominal Pain, Adult Many things can cause belly (abdominal) pain. Most times, belly pain is not dangerous. Many cases of belly pain can be watched and treated at home. Sometimes, though, belly pain is serious. Your doctor will try to find the cause of your belly pain. Follow these instructions at home: Medicines Take over-the-counter and prescription medicines only as told by your doctor. Do not take medicines that help you poop (laxatives) unless told by your doctor. General instructions Watch your belly pain for any changes. Drink enough fluid to keep your pee (urine) pale yellow. Keep all follow-up visits as told by your doctor. This is important. Contact a doctor if: Your belly pain changes or gets worse. You are not hungry, or you lose weight without trying. You are having trouble pooping (constipated) or have watery poop (diarrhea) for more than 2-3 days. You have pain when you pee or poop. Your belly pain wakes you up at night. Your  pain gets worse with meals, after eating, or with certain foods. You are vomiting and cannot keep anything down. You have a fever. You have blood in your pee. Get help right away if: Your pain does not go away as soon as your doctor says it should. You cannot stop vomiting. Your pain is  only in areas of your belly, such as the right side or the left lower part of the belly. You have bloody or black poop, or poop that looks like tar. You have very bad pain, cramping, or bloating in your belly. You have signs of not having enough fluid or water in your body (dehydration), such as: Dark pee, very little pee, or no pee. Cracked lips. Dry mouth. Sunken eyes. Sleepiness. Weakness. You have trouble breathing or chest pain. Summary Many cases of belly pain can be watched and treated at home. Watch your belly pain for any changes. Take over-the-counter and prescription medicines only as told by your doctor. Contact a doctor if your belly pain changes or gets worse. Get help right away if you have very bad pain, cramping, or bloating in your belly. This information is not intended to replace advice given to you by your health care provider. Make sure you discuss any questions you have with your health care provider. Document Revised: 07/30/2018 Document Reviewed: 07/30/2018 Elsevier Patient Education  [place].

## 2020-12-17 DIAGNOSIS — E785 Hyperlipidemia, unspecified: Secondary | ICD-10-CM | POA: Diagnosis not present

## 2020-12-17 DIAGNOSIS — J449 Chronic obstructive pulmonary disease, unspecified: Secondary | ICD-10-CM | POA: Diagnosis not present

## 2020-12-17 DIAGNOSIS — N390 Urinary tract infection, site not specified: Secondary | ICD-10-CM | POA: Diagnosis not present

## 2020-12-17 DIAGNOSIS — F32A Depression, unspecified: Secondary | ICD-10-CM | POA: Diagnosis not present

## 2020-12-17 DIAGNOSIS — Z7902 Long term (current) use of antithrombotics/antiplatelets: Secondary | ICD-10-CM | POA: Diagnosis not present

## 2020-12-17 DIAGNOSIS — I10 Essential (primary) hypertension: Secondary | ICD-10-CM | POA: Diagnosis not present

## 2020-12-17 DIAGNOSIS — K5900 Constipation, unspecified: Secondary | ICD-10-CM | POA: Diagnosis not present

## 2020-12-17 DIAGNOSIS — F17210 Nicotine dependence, cigarettes, uncomplicated: Secondary | ICD-10-CM | POA: Diagnosis not present

## 2020-12-17 DIAGNOSIS — R197 Diarrhea, unspecified: Secondary | ICD-10-CM | POA: Diagnosis not present

## 2020-12-22 DIAGNOSIS — J449 Chronic obstructive pulmonary disease, unspecified: Secondary | ICD-10-CM | POA: Diagnosis not present

## 2020-12-22 DIAGNOSIS — K5900 Constipation, unspecified: Secondary | ICD-10-CM | POA: Diagnosis not present

## 2020-12-22 DIAGNOSIS — R197 Diarrhea, unspecified: Secondary | ICD-10-CM | POA: Diagnosis not present

## 2020-12-22 DIAGNOSIS — E785 Hyperlipidemia, unspecified: Secondary | ICD-10-CM | POA: Diagnosis not present

## 2020-12-22 DIAGNOSIS — N390 Urinary tract infection, site not specified: Secondary | ICD-10-CM | POA: Diagnosis not present

## 2020-12-22 DIAGNOSIS — I10 Essential (primary) hypertension: Secondary | ICD-10-CM | POA: Diagnosis not present

## 2020-12-22 DIAGNOSIS — F32A Depression, unspecified: Secondary | ICD-10-CM | POA: Diagnosis not present

## 2020-12-22 DIAGNOSIS — F17210 Nicotine dependence, cigarettes, uncomplicated: Secondary | ICD-10-CM | POA: Diagnosis not present

## 2020-12-22 DIAGNOSIS — Z7902 Long term (current) use of antithrombotics/antiplatelets: Secondary | ICD-10-CM | POA: Diagnosis not present

## 2020-12-23 ENCOUNTER — Telehealth: Payer: Medicare HMO

## 2020-12-23 DIAGNOSIS — J449 Chronic obstructive pulmonary disease, unspecified: Secondary | ICD-10-CM | POA: Diagnosis not present

## 2020-12-23 DIAGNOSIS — Z7902 Long term (current) use of antithrombotics/antiplatelets: Secondary | ICD-10-CM | POA: Diagnosis not present

## 2020-12-23 DIAGNOSIS — R197 Diarrhea, unspecified: Secondary | ICD-10-CM | POA: Diagnosis not present

## 2020-12-23 DIAGNOSIS — F32A Depression, unspecified: Secondary | ICD-10-CM | POA: Diagnosis not present

## 2020-12-23 DIAGNOSIS — K5900 Constipation, unspecified: Secondary | ICD-10-CM | POA: Diagnosis not present

## 2020-12-23 DIAGNOSIS — E785 Hyperlipidemia, unspecified: Secondary | ICD-10-CM | POA: Diagnosis not present

## 2020-12-23 DIAGNOSIS — I10 Essential (primary) hypertension: Secondary | ICD-10-CM | POA: Diagnosis not present

## 2020-12-23 DIAGNOSIS — N390 Urinary tract infection, site not specified: Secondary | ICD-10-CM | POA: Diagnosis not present

## 2020-12-23 DIAGNOSIS — F17210 Nicotine dependence, cigarettes, uncomplicated: Secondary | ICD-10-CM | POA: Diagnosis not present

## 2020-12-29 DIAGNOSIS — F32A Depression, unspecified: Secondary | ICD-10-CM | POA: Diagnosis not present

## 2020-12-29 DIAGNOSIS — Z7902 Long term (current) use of antithrombotics/antiplatelets: Secondary | ICD-10-CM | POA: Diagnosis not present

## 2020-12-29 DIAGNOSIS — R197 Diarrhea, unspecified: Secondary | ICD-10-CM | POA: Diagnosis not present

## 2020-12-29 DIAGNOSIS — I10 Essential (primary) hypertension: Secondary | ICD-10-CM | POA: Diagnosis not present

## 2020-12-29 DIAGNOSIS — F17210 Nicotine dependence, cigarettes, uncomplicated: Secondary | ICD-10-CM | POA: Diagnosis not present

## 2020-12-29 DIAGNOSIS — N390 Urinary tract infection, site not specified: Secondary | ICD-10-CM | POA: Diagnosis not present

## 2020-12-29 DIAGNOSIS — J449 Chronic obstructive pulmonary disease, unspecified: Secondary | ICD-10-CM | POA: Diagnosis not present

## 2020-12-29 DIAGNOSIS — K5900 Constipation, unspecified: Secondary | ICD-10-CM | POA: Diagnosis not present

## 2020-12-29 DIAGNOSIS — E785 Hyperlipidemia, unspecified: Secondary | ICD-10-CM | POA: Diagnosis not present

## 2020-12-30 DIAGNOSIS — N390 Urinary tract infection, site not specified: Secondary | ICD-10-CM | POA: Diagnosis not present

## 2020-12-30 DIAGNOSIS — E785 Hyperlipidemia, unspecified: Secondary | ICD-10-CM | POA: Diagnosis not present

## 2020-12-30 DIAGNOSIS — I10 Essential (primary) hypertension: Secondary | ICD-10-CM | POA: Diagnosis not present

## 2020-12-30 DIAGNOSIS — F32A Depression, unspecified: Secondary | ICD-10-CM | POA: Diagnosis not present

## 2020-12-30 DIAGNOSIS — J449 Chronic obstructive pulmonary disease, unspecified: Secondary | ICD-10-CM | POA: Diagnosis not present

## 2020-12-30 DIAGNOSIS — Z7902 Long term (current) use of antithrombotics/antiplatelets: Secondary | ICD-10-CM | POA: Diagnosis not present

## 2020-12-30 DIAGNOSIS — F17210 Nicotine dependence, cigarettes, uncomplicated: Secondary | ICD-10-CM | POA: Diagnosis not present

## 2020-12-30 DIAGNOSIS — R197 Diarrhea, unspecified: Secondary | ICD-10-CM | POA: Diagnosis not present

## 2020-12-30 DIAGNOSIS — K5900 Constipation, unspecified: Secondary | ICD-10-CM | POA: Diagnosis not present

## 2020-12-31 ENCOUNTER — Other Ambulatory Visit: Payer: Self-pay | Admitting: Family Medicine

## 2020-12-31 DIAGNOSIS — K5900 Constipation, unspecified: Secondary | ICD-10-CM | POA: Diagnosis not present

## 2020-12-31 DIAGNOSIS — F32A Depression, unspecified: Secondary | ICD-10-CM | POA: Diagnosis not present

## 2020-12-31 DIAGNOSIS — N390 Urinary tract infection, site not specified: Secondary | ICD-10-CM | POA: Diagnosis not present

## 2020-12-31 DIAGNOSIS — E785 Hyperlipidemia, unspecified: Secondary | ICD-10-CM | POA: Diagnosis not present

## 2020-12-31 DIAGNOSIS — I10 Essential (primary) hypertension: Secondary | ICD-10-CM | POA: Diagnosis not present

## 2020-12-31 DIAGNOSIS — R197 Diarrhea, unspecified: Secondary | ICD-10-CM | POA: Diagnosis not present

## 2020-12-31 DIAGNOSIS — Z7902 Long term (current) use of antithrombotics/antiplatelets: Secondary | ICD-10-CM | POA: Diagnosis not present

## 2020-12-31 DIAGNOSIS — J449 Chronic obstructive pulmonary disease, unspecified: Secondary | ICD-10-CM | POA: Diagnosis not present

## 2020-12-31 DIAGNOSIS — Z8673 Personal history of transient ischemic attack (TIA), and cerebral infarction without residual deficits: Secondary | ICD-10-CM

## 2020-12-31 DIAGNOSIS — F17210 Nicotine dependence, cigarettes, uncomplicated: Secondary | ICD-10-CM | POA: Diagnosis not present

## 2021-01-04 DIAGNOSIS — F32A Depression, unspecified: Secondary | ICD-10-CM | POA: Diagnosis not present

## 2021-01-04 DIAGNOSIS — E785 Hyperlipidemia, unspecified: Secondary | ICD-10-CM | POA: Diagnosis not present

## 2021-01-04 DIAGNOSIS — N390 Urinary tract infection, site not specified: Secondary | ICD-10-CM | POA: Diagnosis not present

## 2021-01-04 DIAGNOSIS — J449 Chronic obstructive pulmonary disease, unspecified: Secondary | ICD-10-CM | POA: Diagnosis not present

## 2021-01-04 DIAGNOSIS — Z7902 Long term (current) use of antithrombotics/antiplatelets: Secondary | ICD-10-CM | POA: Diagnosis not present

## 2021-01-04 DIAGNOSIS — I10 Essential (primary) hypertension: Secondary | ICD-10-CM | POA: Diagnosis not present

## 2021-01-04 DIAGNOSIS — R197 Diarrhea, unspecified: Secondary | ICD-10-CM | POA: Diagnosis not present

## 2021-01-04 DIAGNOSIS — K5900 Constipation, unspecified: Secondary | ICD-10-CM | POA: Diagnosis not present

## 2021-01-04 DIAGNOSIS — F17210 Nicotine dependence, cigarettes, uncomplicated: Secondary | ICD-10-CM | POA: Diagnosis not present

## 2021-01-06 ENCOUNTER — Ambulatory Visit (INDEPENDENT_AMBULATORY_CARE_PROVIDER_SITE_OTHER): Payer: Medicare HMO | Admitting: Licensed Clinical Social Worker

## 2021-01-06 DIAGNOSIS — J449 Chronic obstructive pulmonary disease, unspecified: Secondary | ICD-10-CM

## 2021-01-06 DIAGNOSIS — F17210 Nicotine dependence, cigarettes, uncomplicated: Secondary | ICD-10-CM | POA: Diagnosis not present

## 2021-01-06 DIAGNOSIS — I10 Essential (primary) hypertension: Secondary | ICD-10-CM | POA: Diagnosis not present

## 2021-01-06 DIAGNOSIS — N400 Enlarged prostate without lower urinary tract symptoms: Secondary | ICD-10-CM

## 2021-01-06 DIAGNOSIS — R197 Diarrhea, unspecified: Secondary | ICD-10-CM | POA: Diagnosis not present

## 2021-01-06 DIAGNOSIS — Z91199 Patient's noncompliance with other medical treatment and regimen due to unspecified reason: Secondary | ICD-10-CM

## 2021-01-06 DIAGNOSIS — R531 Weakness: Secondary | ICD-10-CM

## 2021-01-06 DIAGNOSIS — N390 Urinary tract infection, site not specified: Secondary | ICD-10-CM | POA: Diagnosis not present

## 2021-01-06 DIAGNOSIS — F339 Major depressive disorder, recurrent, unspecified: Secondary | ICD-10-CM

## 2021-01-06 DIAGNOSIS — F32A Depression, unspecified: Secondary | ICD-10-CM | POA: Diagnosis not present

## 2021-01-06 DIAGNOSIS — Z8673 Personal history of transient ischemic attack (TIA), and cerebral infarction without residual deficits: Secondary | ICD-10-CM

## 2021-01-06 DIAGNOSIS — E782 Mixed hyperlipidemia: Secondary | ICD-10-CM

## 2021-01-06 DIAGNOSIS — E785 Hyperlipidemia, unspecified: Secondary | ICD-10-CM | POA: Diagnosis not present

## 2021-01-06 DIAGNOSIS — Z720 Tobacco use: Secondary | ICD-10-CM

## 2021-01-06 DIAGNOSIS — K5900 Constipation, unspecified: Secondary | ICD-10-CM | POA: Diagnosis not present

## 2021-01-06 DIAGNOSIS — Z7902 Long term (current) use of antithrombotics/antiplatelets: Secondary | ICD-10-CM | POA: Diagnosis not present

## 2021-01-06 NOTE — Patient Instructions (Signed)
Visit Information  PATIENT GOALS:  Goals Addressed               This Visit's Progress     Manage Depression and Depression symptoms (pt-stated)         Timeframe : Short Term Goal  This  Visit's Progress : On Track   Priority: Medium   Start Date : 11/27/20   Expected End Date 03/23/21   Completed Date   Follow Up Date:  03/01/21 at 2:30 PM  Manage Depression and Depression Symptoms  Patient Self Care Activities:  Eats meals with set up Communicates regularly with spouse, [name]  Patient Coping Strengths:  Communicates regularly with spouse, [name] Completes ADLs  Patient Self Care Deficits:  Depression symptoms Mobility issues  Patient Goals:  - spend time or talk with others every day - practice relaxation or meditation daily - keep a calendar with appointment dates  Follow Up Plan: LCSW to call client or spouse of client on 03/01/21 at 2:30 PM to assess client needs     [name].Jaxen Samples MSW, LCSW Licensed Clinical Social Worker [facility] Care Management [phone]

## 2021-01-06 NOTE — Chronic Care Management (AMB) (Signed)
Chronic Care Management    Clinical Social Work Note  01/06/2021 Name: Walter [name] MRN: [id number] DOB: November 07, 1950  Walter [name] is a 70 y.o. year old male who is a primary care patient of Dettinger, Fransisca Kaufmann, MD. The CCM team was consulted to assist the patient with chronic disease management and/or care coordination needs related to: [name] .   Engaged with patient/ Walter [name], spouse of patient, by telephone for follow up visit in response to provider referral for social work chronic care management and care coordination services.   Consent to Services:  The patient was given information about Chronic Care Management services, agreed to services, and gave verbal consent prior to initiation of services.  Please see initial visit note for detailed documentation.   Patient agreed to services and consent obtained.   Assessment: Review of patient past medical history, allergies, medications, and health status, including review of relevant consultants reports was performed today as part of a comprehensive evaluation and provision of chronic care management and care coordination services.     SDOH (Social Determinants of Health) assessments and interventions performed:  SDOH Interventions    Flowsheet Row Most Recent Value  SDOH Interventions   Physical Activity Interventions Other (Comments)  [walking challenges, uses a walker to help him ambulate]  Depression Interventions/Treatment  Currently on Treatment        Advanced Directives Status: See Vynca application for related entries.  CCM Care Plan  No Known Allergies  Outpatient Encounter Medications as of 01/06/2021  Medication Sig   acetaminophen (TYLENOL) 325 MG tablet Take 1-2 tablets (325-650 mg total) by mouth every 6 (six) hours as needed for mild pain (pain score 1-3).   amLODipine (NORVASC) 5 MG tablet Take 1 tablet (5 mg total) by mouth daily. (NEEDS TO BE SEEN BEFORE NEXT REFILL)   aspirin EC 81 MG  tablet Take 81 mg by mouth daily. Swallow whole.   atorvastatin (LIPITOR) 40 MG tablet Take 1 tablet (40 mg total) by mouth daily.   clopidogrel (PLAVIX) 75 MG tablet Take 1 tablet (75 mg total) by mouth daily.   DULoxetine (CYMBALTA) 30 MG capsule Take 1 capsule (30 mg total) by mouth at bedtime.   ferrous sulfate 325 (65 FE) MG tablet Take 1 tablet (325 mg total) by mouth 2 (two) times daily with a meal.   Multiple Vitamins-Minerals (ONE-A-DAY 50 PLUS PO) Take by mouth daily.   polyethylene glycol (MIRALAX / GLYCOLAX) 17 g packet Take 17 g by mouth daily as needed for mild constipation.   tamsulosin (FLOMAX) 0.4 MG CAPS capsule Take 1 capsule (0.4 mg total) by mouth daily after breakfast.   No facility-administered encounter medications on file as of 01/06/2021.    Patient Active Problem List   Diagnosis Date Noted   Generalized abdominal pain 12/16/2020   Speech apraxia 12/04/2020   Vascular dementia ([place]) 12/04/2020   S/P left THA, AA 09/09/2019   DNR (do not resuscitate) 09/08/2019   Hip fracture, left, closed, initial encounter ([place]) 09/07/2019   Depression, recurrent ([place]) 04/06/2018   Hyperlipidemia, mixed 04/06/2018   HTN (hypertension) 01/12/2015   BPH (benign prostatic hyperplasia) 01/12/2015   Nodule of kidney 02/14/2014   Weakness 02/11/2014   Recurrent colitis due to Clostridium difficile 02/11/2014   Occlusion and stenosis of carotid artery without mention of cerebral infarction 09/07/2012   History of CVA (cerebrovascular accident) 02/18/2012   Tobacco abuse 02/18/2012   Non-compliance 02/18/2012   COPD (chronic obstructive pulmonary disease) (  [place]) 02/18/2012    Conditions to be addressed/monitored: monitor client management of depression symptoms  Care Plan : LCSW care plan  Updates made by [name], LCSW since 01/06/2021 12:00 AM     Problem: Depression Identification (Depression)      Goal: Manage depression and depression symptoms   Start Date:  11/27/2020  Expected End Date: 03/23/2021  This Visit's Progress: On track  Recent Progress: On track  Priority: Medium  Note:   Current Barriers:  Managing Depression symptoms is challenging for client Mobility issues Suicidal Ideation/Homicidal Ideation: No  Clinical Social Work Goal(s):  patient will work with SW monthly by telephone or in person to reduce or manage symptoms related to depression and depression symptoms Client to attend scheduled medical appointments in next 30 days Patient or spouse of patient will call RNCM as needed in next 30 days for nursing support for client  Interventions:  1:1 collaboration with Dr. Fransisca Kaufmann Dettinger, MD regarding development and update of comprehensive plan of care as evidenced by provider attestation and co-signature. Discussed client needs with Walter [name], spouse of client.  Reviewed with [name] client fall risk.   Discussed client sleeping issues with [name], Reviewed client's appetite with [name]. [name] about placement options for client. She had talked previously with LCSW about possible SNF placement for client.  [facility] had originated an FL-2 document for client for possible placement.  [name] informed LCSW that after her recent discussion with Dr.Dettinger, she had decided to allow client to continue to remain at home for care at this time. She said client had been receiving [place] [place]. She said HHOT and HHNursing are completed for client. She said client continues to receive HHPT at home at this time . Reviewed with [name] current mood status of client. She said his mood is stable at present Encouraged [name] or client to call [facility] as needed for CCM nursing support for client  Patient Self Care Activities:  Eats meals with set up Communicates regularly with spouse, [name]  Patient Coping Strengths:  Communicates regularly with spouse, [name] Completes ADLs  Patient Self Care  Deficits:  Depression symptoms Mobility issues At risk for falls  Patient Goals:  - spend time or talk with others every day - practice relaxation or meditation daily - keep a calendar with appointment dates  Follow Up Plan: LCSW to call client or spouse of client on 03/01/21 at 2:30 PM to assess client needs     [name].Hurschel Paynter MSW, LCSW Licensed Clinical Social Worker [facility] Care Management [phone]

## 2021-01-08 ENCOUNTER — Ambulatory Visit (INDEPENDENT_AMBULATORY_CARE_PROVIDER_SITE_OTHER): Payer: Medicare HMO

## 2021-01-08 ENCOUNTER — Other Ambulatory Visit: Payer: Self-pay

## 2021-01-08 DIAGNOSIS — J449 Chronic obstructive pulmonary disease, unspecified: Secondary | ICD-10-CM

## 2021-01-08 DIAGNOSIS — I10 Essential (primary) hypertension: Secondary | ICD-10-CM | POA: Diagnosis not present

## 2021-01-08 DIAGNOSIS — Z8673 Personal history of transient ischemic attack (TIA), and cerebral infarction without residual deficits: Secondary | ICD-10-CM

## 2021-01-08 DIAGNOSIS — F17210 Nicotine dependence, cigarettes, uncomplicated: Secondary | ICD-10-CM | POA: Diagnosis not present

## 2021-01-08 DIAGNOSIS — Z7902 Long term (current) use of antithrombotics/antiplatelets: Secondary | ICD-10-CM

## 2021-01-08 DIAGNOSIS — Z9181 History of falling: Secondary | ICD-10-CM

## 2021-01-08 DIAGNOSIS — E785 Hyperlipidemia, unspecified: Secondary | ICD-10-CM

## 2021-01-08 DIAGNOSIS — R197 Diarrhea, unspecified: Secondary | ICD-10-CM | POA: Diagnosis not present

## 2021-01-08 DIAGNOSIS — F32A Depression, unspecified: Secondary | ICD-10-CM

## 2021-01-08 DIAGNOSIS — K5900 Constipation, unspecified: Secondary | ICD-10-CM

## 2021-01-08 DIAGNOSIS — N390 Urinary tract infection, site not specified: Secondary | ICD-10-CM | POA: Diagnosis not present

## 2021-02-01 DIAGNOSIS — F339 Major depressive disorder, recurrent, unspecified: Secondary | ICD-10-CM | POA: Diagnosis not present

## 2021-02-01 DIAGNOSIS — N400 Enlarged prostate without lower urinary tract symptoms: Secondary | ICD-10-CM

## 2021-02-01 DIAGNOSIS — E782 Mixed hyperlipidemia: Secondary | ICD-10-CM | POA: Diagnosis not present

## 2021-02-01 DIAGNOSIS — I10 Essential (primary) hypertension: Secondary | ICD-10-CM

## 2021-02-01 DIAGNOSIS — J449 Chronic obstructive pulmonary disease, unspecified: Secondary | ICD-10-CM | POA: Diagnosis not present

## 2021-02-08 ENCOUNTER — Telehealth: Payer: Medicare HMO

## 2021-03-01 ENCOUNTER — Telehealth: Payer: Medicare HMO

## 2021-03-15 DIAGNOSIS — Z85828 Personal history of other malignant neoplasm of skin: Secondary | ICD-10-CM | POA: Diagnosis not present

## 2021-03-15 DIAGNOSIS — Z08 Encounter for follow-up examination after completed treatment for malignant neoplasm: Secondary | ICD-10-CM | POA: Diagnosis not present

## 2021-03-15 DIAGNOSIS — C44319 Basal cell carcinoma of skin of other parts of face: Secondary | ICD-10-CM | POA: Diagnosis not present

## 2021-03-15 DIAGNOSIS — C44311 Basal cell carcinoma of skin of nose: Secondary | ICD-10-CM | POA: Diagnosis not present

## 2021-03-16 ENCOUNTER — Telehealth: Payer: Medicare HMO

## 2021-03-31 ENCOUNTER — Ambulatory Visit (INDEPENDENT_AMBULATORY_CARE_PROVIDER_SITE_OTHER): Payer: Medicare HMO | Admitting: Licensed Clinical Social Worker

## 2021-03-31 DIAGNOSIS — R531 Weakness: Secondary | ICD-10-CM

## 2021-03-31 DIAGNOSIS — I10 Essential (primary) hypertension: Secondary | ICD-10-CM

## 2021-03-31 DIAGNOSIS — I6521 Occlusion and stenosis of right carotid artery: Secondary | ICD-10-CM

## 2021-03-31 DIAGNOSIS — E782 Mixed hyperlipidemia: Secondary | ICD-10-CM

## 2021-03-31 DIAGNOSIS — F339 Major depressive disorder, recurrent, unspecified: Secondary | ICD-10-CM

## 2021-03-31 DIAGNOSIS — Z720 Tobacco use: Secondary | ICD-10-CM

## 2021-03-31 DIAGNOSIS — N400 Enlarged prostate without lower urinary tract symptoms: Secondary | ICD-10-CM

## 2021-03-31 DIAGNOSIS — Z91199 Patient's noncompliance with other medical treatment and regimen due to unspecified reason: Secondary | ICD-10-CM

## 2021-03-31 DIAGNOSIS — J449 Chronic obstructive pulmonary disease, unspecified: Secondary | ICD-10-CM

## 2021-03-31 DIAGNOSIS — Z8673 Personal history of transient ischemic attack (TIA), and cerebral infarction without residual deficits: Secondary | ICD-10-CM

## 2021-03-31 NOTE — Patient Instructions (Addendum)
Visit Information  Patient Goals: Protect My Health (Patient). Manage Depression issues  Timeframe:  Short-Term Goal Priority:  Medium Progress: On Track Start Date:            03/31/21                Expected End Date:          06/24/21            Follow Up Date   05/31/21 at 3:00 PM   Protect My Health (Patient)  Manage Depression issues    Why is this important?   Screening tests can find diseases early when they are easier to treat.  Your doctor or nurse will talk with you about which tests are important for you.  Getting shots for common diseases like the flu and shingles will help prevent them.     Patient Self Care Activities:  Eats meals with set up Communicates regularly with spouse, [name]  Patient Coping Strengths:  Communicates regularly with spouse, [name] Completes ADLs  Patient Self Care Deficits:  Depression symptoms Mobility issues  Patient Goals:  - spend time or talk with others every day - practice relaxation or meditation daily - keep a calendar with appointment dates  Follow Up Plan: LCSW to call client or spouse of client on 05/31/21 at 3:00 PM to assess client needs   [name].Mandeep Kiser MSW, LCSW Licensed Clinical Social Worker [facility] Care Management [phone]

## 2021-03-31 NOTE — Chronic Care Management (AMB) (Signed)
Chronic Care Management    Clinical Social Work Note  03/31/2021 Name: Walter [name] MRN: [id number] DOB: 07/15/1950  Walter [name] is a 70 y.o. year old male who is a primary care patient of Dettinger, Fransisca Kaufmann, MD. The CCM team was consulted to assist the patient with chronic disease management and/or care coordination needs related to: [name] .   Engaged with patient / spouse of patient, [name], by telephone for follow up visit in response to provider referral for social work chronic care management and care coordination services.   Consent to Services:  The patient was given information about Chronic Care Management services, agreed to services, and gave verbal consent prior to initiation of services.  Please see initial visit note for detailed documentation.   Patient agreed to services and consent obtained.   Assessment: Review of patient past medical history, allergies, medications, and health status, including review of relevant consultants reports was performed today as part of a comprehensive evaluation and provision of chronic care management and care coordination services.     SDOH (Social Determinants of Health) assessments and interventions performed:  SDOH Interventions    Flowsheet Row Most Recent Value  SDOH Interventions   Physical Activity Interventions Other (Comments)  [walking challenges. client uses a walker to help him walk]  Stress Interventions Other (Comment)  [client has stress related to managing medical needs. client has stress related to memory issues]  Depression Interventions/Treatment  Currently on Treatment        Advanced Directives Status: See Vynca application for related entries.  CCM Care Plan  No Known Allergies  Outpatient Encounter Medications as of 03/31/2021  Medication Sig   acetaminophen (TYLENOL) 325 MG tablet Take 1-2 tablets (325-650 mg total) by mouth every 6 (six) hours as needed for mild pain (pain score  1-3).   amLODipine (NORVASC) 5 MG tablet Take 1 tablet (5 mg total) by mouth daily. (NEEDS TO BE SEEN BEFORE NEXT REFILL)   aspirin EC 81 MG tablet Take 81 mg by mouth daily. Swallow whole.   atorvastatin (LIPITOR) 40 MG tablet Take 1 tablet (40 mg total) by mouth daily.   clopidogrel (PLAVIX) 75 MG tablet Take 1 tablet (75 mg total) by mouth daily.   DULoxetine (CYMBALTA) 30 MG capsule Take 1 capsule (30 mg total) by mouth at bedtime.   ferrous sulfate 325 (65 FE) MG tablet Take 1 tablet (325 mg total) by mouth 2 (two) times daily with a meal.   Multiple Vitamins-Minerals (ONE-A-DAY 50 PLUS PO) Take by mouth daily.   polyethylene glycol (MIRALAX / GLYCOLAX) 17 g packet Take 17 g by mouth daily as needed for mild constipation.   tamsulosin (FLOMAX) 0.4 MG CAPS capsule Take 1 capsule (0.4 mg total) by mouth daily after breakfast.   No facility-administered encounter medications on file as of 03/31/2021.    Patient Active Problem List   Diagnosis Date Noted   Generalized abdominal pain 12/16/2020   Speech apraxia 12/04/2020   Vascular dementia ([place]) 12/04/2020   S/P left THA, AA 09/09/2019   DNR (do not resuscitate) 09/08/2019   Hip fracture, left, closed, initial encounter ([place]) 09/07/2019   Depression, recurrent ([place]) 04/06/2018   Hyperlipidemia, mixed 04/06/2018   HTN (hypertension) 01/12/2015   BPH (benign prostatic hyperplasia) 01/12/2015   Nodule of kidney 02/14/2014   Weakness 02/11/2014   Recurrent colitis due to Clostridium difficile 02/11/2014   Occlusion and stenosis of carotid artery without mention of cerebral infarction 09/07/2012  History of CVA (cerebrovascular accident) 02/18/2012   Tobacco abuse 02/18/2012   Non-compliance 02/18/2012   COPD (chronic obstructive pulmonary disease) ([place]) 02/18/2012    Conditions to be addressed/monitored: monitor client management of depression and depression issues  Care Plan : LCSW care plan  Updates made by [name],  LCSW since 03/31/2021 12:00 AM     Problem: Depression Identification (Depression)      Goal: Manage depression and depression symptoms   Start Date: 03/31/2021  Expected End Date: 06/28/2021  This Visit's Progress: On track  Recent Progress: On track  Priority: Medium  Note:   Current Barriers:  Managing Depression symptoms is challenging for client Mobility issues Memory issues Suicidal Ideation/Homicidal Ideation: No  Clinical Social Work Goal(s):  patient will work with SW monthly by telephone or in person to reduce or manage symptoms related to depression and depression management  Client to attend scheduled medical appointments in next 30 days Patient or spouse of patient will call RNCM as needed in next 30 days for nursing support for client  Interventions: 1:1 collaboration with Dr. Fransisca Kaufmann Dettinger, MD regarding development and update of comprehensive plan of care as evidenced by provider attestation and co-signature. Discussed client needs with [name], spouse of client.  Reviewed with [name] client fall risk.  She said client has not had any recent falls Discussed client sleeping issues with [name].  She said client is sleeping well at night; she said client also takes naps occasionally during the day.  Reviewed client's appetite with [name]. [name] said client is eating adequately. Encouraged [name] or client to call RNCM as needed for CCM nursing support for client Discussed nursing needs of client. [name] said client did not have any nursing needs at present. Discussed medication procurement for client. Informed [name] of pharmacy support for client with Dr. Lottie Dawson. Pharmacist at [facility] Discussed memory issues of client.  Patient Self Care Activities:  Eats meals with set up Communicates regularly with spouse, [name]  Patient Coping Strengths:  Communicates regularly with spouse, [name] Completes ADLs  Patient Self Care Deficits:  Depression  symptoms Mobility issues At risk for falls  Patient Goals:  - spend time or talk with others every day - practice relaxation or meditation daily - keep a calendar with appointment dates  Follow Up Plan: LCSW to call client or spouse of client on 05/31/21 at 3:00 PM to assess client needs      [name].Starasia Sinko MSW, LCSW Licensed Clinical Social Worker [facility] Care Management [phone]

## 2021-04-03 DIAGNOSIS — N400 Enlarged prostate without lower urinary tract symptoms: Secondary | ICD-10-CM

## 2021-04-03 DIAGNOSIS — I10 Essential (primary) hypertension: Secondary | ICD-10-CM

## 2021-04-03 DIAGNOSIS — J449 Chronic obstructive pulmonary disease, unspecified: Secondary | ICD-10-CM

## 2021-04-03 DIAGNOSIS — F339 Major depressive disorder, recurrent, unspecified: Secondary | ICD-10-CM

## 2021-04-03 DIAGNOSIS — E782 Mixed hyperlipidemia: Secondary | ICD-10-CM

## 2021-04-12 ENCOUNTER — Ambulatory Visit: Payer: Medicare HMO | Admitting: Family Medicine

## 2021-04-12 ENCOUNTER — Encounter: Payer: Self-pay | Admitting: Family Medicine

## 2021-05-11 ENCOUNTER — Ambulatory Visit (INDEPENDENT_AMBULATORY_CARE_PROVIDER_SITE_OTHER): Payer: Medicare HMO | Admitting: Licensed Clinical Social Worker

## 2021-05-11 DIAGNOSIS — Z8673 Personal history of transient ischemic attack (TIA), and cerebral infarction without residual deficits: Secondary | ICD-10-CM

## 2021-05-11 DIAGNOSIS — F339 Major depressive disorder, recurrent, unspecified: Secondary | ICD-10-CM

## 2021-05-11 DIAGNOSIS — E782 Mixed hyperlipidemia: Secondary | ICD-10-CM

## 2021-05-11 DIAGNOSIS — Z720 Tobacco use: Secondary | ICD-10-CM

## 2021-05-11 DIAGNOSIS — I10 Essential (primary) hypertension: Secondary | ICD-10-CM

## 2021-05-11 DIAGNOSIS — R531 Weakness: Secondary | ICD-10-CM

## 2021-05-11 DIAGNOSIS — Z91199 Patient's noncompliance with other medical treatment and regimen due to unspecified reason: Secondary | ICD-10-CM

## 2021-05-11 DIAGNOSIS — N400 Enlarged prostate without lower urinary tract symptoms: Secondary | ICD-10-CM

## 2021-05-11 DIAGNOSIS — J449 Chronic obstructive pulmonary disease, unspecified: Secondary | ICD-10-CM

## 2021-05-11 NOTE — Chronic Care Management (AMB) (Signed)
Chronic Care Management    Clinical Social Work Note  05/11/2021 Name: Walter [name] MRN: [id number] DOB: 29-Oct-1950  Walter [name] is a 71 y.o. year old male who is a primary care patient of Dettinger, Walter Kaufmann, MD. The CCM team was consulted to assist the patient with chronic disease management and/or care coordination needs related to: [name] .   Engaged with patient / spouse of patient, Walter [name], by telephone for follow up visit in response to provider referral for social work chronic care management and care coordination services.   Consent to Services:  The patient was given information about Chronic Care Management services, agreed to services, and gave verbal consent prior to initiation of services.  Please see initial visit note for detailed documentation.   Patient agreed to services and consent obtained.   Assessment: Review of patient past medical history, allergies, medications, and health status, including review of relevant consultants reports was performed today as part of a comprehensive evaluation and provision of chronic care management and care coordination services.     SDOH (Social Determinants of Health) assessments and interventions performed:  SDOH Interventions    Flowsheet Row Most Recent Value  SDOH Interventions   Physical Activity Interventions Other (Comments)  [walking challenges. client uses a walker to help him walk]  Stress Interventions Other (Comment)  [client has stress related to managing medical needs]  Depression Interventions/Treatment  Currently on Treatment        Advanced Directives Status: See [employer] application for related entries.  CCM Care Plan  No Known Allergies  Outpatient Encounter Medications as of 05/11/2021  Medication Sig   acetaminophen (TYLENOL) 325 MG tablet Take 1-2 tablets (325-650 mg total) by mouth every 6 (six) hours as needed for mild pain (pain score 1-3).   amLODipine (NORVASC) 5 MG tablet Take 1  tablet (5 mg total) by mouth daily. (NEEDS TO BE SEEN BEFORE NEXT REFILL)   aspirin EC 81 MG tablet Take 81 mg by mouth daily. Swallow whole.   atorvastatin (LIPITOR) 40 MG tablet Take 1 tablet (40 mg total) by mouth daily.   clopidogrel (PLAVIX) 75 MG tablet Take 1 tablet (75 mg total) by mouth daily.   DULoxetine (CYMBALTA) 30 MG capsule Take 1 capsule (30 mg total) by mouth at bedtime.   ferrous sulfate 325 (65 FE) MG tablet Take 1 tablet (325 mg total) by mouth 2 (two) times daily with a meal.   Multiple Vitamins-Minerals (ONE-A-DAY 50 PLUS PO) Take by mouth daily.   polyethylene glycol (MIRALAX / GLYCOLAX) 17 g packet Take 17 g by mouth daily as needed for mild constipation.   tamsulosin (FLOMAX) 0.4 MG CAPS capsule Take 1 capsule (0.4 mg total) by mouth daily after breakfast.   No facility-administered encounter medications on file as of 05/11/2021.    Patient Active Problem List   Diagnosis Date Noted   Generalized abdominal pain 12/16/2020   Speech apraxia 12/04/2020   Vascular dementia ([place]) 12/04/2020   S/P left THA, AA 09/09/2019   DNR (do not resuscitate) 09/08/2019   Hip fracture, left, closed, initial encounter ([place]) 09/07/2019   Depression, recurrent ([place]) 04/06/2018   Hyperlipidemia, mixed 04/06/2018   HTN (hypertension) 01/12/2015   BPH (benign prostatic hyperplasia) 01/12/2015   Nodule of kidney 02/14/2014   Weakness 02/11/2014   Recurrent colitis due to Clostridium difficile 02/11/2014   Occlusion and stenosis of carotid artery without mention of cerebral infarction 09/07/2012   History of CVA (cerebrovascular accident) 02/18/2012  Tobacco abuse 02/18/2012   Non-compliance 02/18/2012   COPD (chronic obstructive pulmonary disease) ([place]) 02/18/2012    Conditions to be addressed/monitored: monitor client management of depression issues  Care Plan : LCSW care plan  Updates made by Walter Cabal, LCSW since 05/11/2021 12:00 AM     Problem: Depression  Identification (Depression)      Goal: Manage depression and depression symptoms   Start Date: 05/11/2021  Expected End Date: 08/05/2021  This Visit's Progress: On track  Recent Progress: On track  Priority: Medium  Note:   Current Barriers:  Managing Depression symptoms is challenging for client Mobility issues Memory issues Decreased appetite Suicidal Ideation/Homicidal Ideation: No  Clinical Social Work Goal(s):  patient will work with SW monthly by telephone or in person to reduce or manage symptoms related to depression and depression management  Client to attend scheduled medical appointments in next 30 days Patient or spouse of patient will call RNCM as needed in next 30 days for nursing support for client  Interventions: 1:1 collaboration with Dr. Fransisca [name] Dettinger, MD regarding development and update of comprehensive plan of care as evidenced by provider attestation and co-signature. Discussed client needs with Walter [name], spouse of client.  Reviewed with Walter [name] client fall risk.  She said client has had a few falls in past 2 weeks but had no injuries from these falls.  Discussed client sleeping issues with Walter [name].  She said client is sleeping well . Reviewed client's appetite with Walter [name]. Walter [name] said client  has reduced appetite.She said client is eating small meals daily. Encouraged Walter [name] or client to call RNCM as needed for CCM nursing support for client Discussed nursing needs of client. Walter [name] said client did not have any nursing needs at present. Discussed medication procurement for client. Walter [name] said client has his prescribed medications.  Reviewed mood status of client. Walter [name] said client mood was fairly stable.  She said  "He gets a little grumpy sometimes. Sometimes I have to ask him the same questions a few times before he responds." But overall, she thought his mood was stable Reviewed ambulation of client. Walter [name] said client is using a walker to help him  walk  Patient Self Care Activities:  Eats meals with set up Communicates regularly with spouse, Walter [name]  Patient Coping Strengths:  Communicates regularly with spouse, Walter [name] Completes ADLs  Patient Self Care Deficits:  Depression symptoms Mobility issues At risk for falls  Patient Goals:  - spend time or talk with others every day - practice relaxation or meditation daily - keep a calendar with appointment dates  Follow Up Plan: LCSW to call client or spouse of client on 07/05/21 at 10:00 AM to assess client needs      [name].Garhett Bernhard MSW, [place] [employer] representative [facility] Care Management [phone]

## 2021-05-11 NOTE — Patient Instructions (Addendum)
Visit Information  Patient Goals:  Protect My Health (Patient). Manage Depression issues  Timeframe:  Short-Term Goal Priority:  Medium Progress: On Track Start Date:           05/11/21                Expected End Date:         08/05/21            Follow Up Date   07/05/21 at 10:00 AM   Protect My Health (Patient)  Manage Depression issues    Why is this important?   Screening tests can find diseases early when they are easier to treat.  Your doctor or nurse will talk with you about which tests are important for you.  Getting shots for common diseases like the flu and shingles will help prevent them.     Patient Self Care Activities:  Eats meals with set up Communicates regularly with spouse, [name]  Patient Coping Strengths:  Communicates regularly with spouse, [name] Completes ADLs  Patient Self Care Deficits:  Depression symptoms Mobility issues  Patient Goals:  - spend time or talk with others every day - practice relaxation or meditation daily - keep a calendar with appointment dates  Follow Up Plan: LCSW to call client or spouse of client on 07/05/21 at 10:00 AM   [name].Briza Bark MSW, [place] [employer] representative [facility] Care Management [phone]

## 2021-05-13 DIAGNOSIS — Z85828 Personal history of other malignant neoplasm of skin: Secondary | ICD-10-CM | POA: Diagnosis not present

## 2021-05-13 DIAGNOSIS — C44319 Basal cell carcinoma of skin of other parts of face: Secondary | ICD-10-CM | POA: Diagnosis not present

## 2021-05-13 DIAGNOSIS — Z08 Encounter for follow-up examination after completed treatment for malignant neoplasm: Secondary | ICD-10-CM | POA: Diagnosis not present

## 2021-05-17 ENCOUNTER — Ambulatory Visit (INDEPENDENT_AMBULATORY_CARE_PROVIDER_SITE_OTHER): Payer: Medicare HMO | Admitting: Family Medicine

## 2021-05-17 ENCOUNTER — Encounter: Payer: Self-pay | Admitting: Family Medicine

## 2021-05-17 VITALS — BP 143/76 | HR 92 | Ht 73.0 in | Wt 185.0 lb

## 2021-05-17 DIAGNOSIS — E782 Mixed hyperlipidemia: Secondary | ICD-10-CM

## 2021-05-17 DIAGNOSIS — I10 Essential (primary) hypertension: Secondary | ICD-10-CM

## 2021-05-17 DIAGNOSIS — J449 Chronic obstructive pulmonary disease, unspecified: Secondary | ICD-10-CM

## 2021-05-17 DIAGNOSIS — F339 Major depressive disorder, recurrent, unspecified: Secondary | ICD-10-CM

## 2021-05-17 MED ORDER — AMLODIPINE BESYLATE 5 MG PO TABS: 5.0000 mg | ORAL_TABLET | Freq: Every day | ORAL | 3 refills | Status: DC

## 2021-05-17 NOTE — Progress Notes (Signed)
BP (!) 143/76    Pulse 92    Ht 6\' 1"  (1.854 m)    Wt 185 lb (83.9 kg)    SpO2 98%    BMI 24.41 kg/m    Subjective:   Patient ID: Walter [name], male    DOB: 16-Mar-1951, 71 y.o.   MRN: [id number]  HPI: Walter [name] is a 71 y.o. male presenting on 05/17/2021 for Medical Management of Chronic Issues and Hypertension   HPI Hypertension Patient is currently on amlodipine, and their blood pressure today is 143/76. Patient denies any lightheadedness or dizziness. Patient denies headaches, blurred vision, chest pains, shortness of breath, or weakness. Denies any side effects from medication and is content with current medication.   Hyperlipidemia Patient is coming in for recheck of his hyperlipidemia. The patient is currently taking atorvastatin. They deny any issues with myalgias or history of liver damage from it. They deny any focal numbness or weakness or chest pain.   COPD Patient is coming in for COPD recheck today.  He is currently on no medication.  He has a mild chronic cough but denies any major coughing spells or wheezing spells.  He has 0nighttime symptoms per week and 0daytime symptoms per week currently.   Depression and vascular dementia recheck Patient currently takes no medication for depression and per his wife she says he has been stable and doing okay.  He has been sleeping at night.  His memory still worsens and he does cry some but she feels like its been stable. Depression screen [facility] 2/9 05/17/2021 05/11/2021 03/31/2021 01/06/2021 12/16/2020  Decreased Interest 2 1 1 1 2   Down, Depressed, Hopeless 2 1 1 1 2   PHQ - 2 Score 4 2 2 2 4   Altered sleeping 3 1 1 1 1   Tired, decreased energy 2 2 2 2 1   Change in appetite 2 2 0 1 1  Feeling bad or failure about yourself  3 1 1 1 1   Trouble concentrating 3 1 1 1 1   Moving slowly or fidgety/restless 2 1 1 2 2   Suicidal thoughts 0 0 0 0 1  PHQ-9 Score 19 10 8 10 12   Difficult doing work/chores - Somewhat difficult Somewhat difficult  Somewhat difficult Somewhat difficult  Some recent data might be hidden     Relevant past medical, surgical, family and social history reviewed and updated as indicated. Interim medical history since our last visit reviewed. Allergies and medications reviewed and updated.  Review of Systems  Constitutional:  Negative for chills and fever.  Eyes:  Negative for visual disturbance.  Respiratory:  Negative for shortness of breath and wheezing.   Cardiovascular:  Negative for chest pain and leg swelling.  Skin:  Negative for rash.  Neurological:  Positive for speech difficulty. Negative for dizziness and light-headedness.  Psychiatric/Behavioral:  Positive for confusion and dysphoric mood. Negative for self-injury, sleep disturbance and suicidal ideas. The patient is nervous/anxious.   All other systems reviewed and are negative.  Per HPI unless specifically indicated above   Allergies as of 05/17/2021   No Known Allergies      Medication List        Accurate as of May 17, 2021  1:39 PM. If you have any questions, ask your nurse or doctor.          acetaminophen 325 MG tablet Commonly known as: TYLENOL Take 1-2 tablets (325-650 mg total) by mouth every 6 (six) hours as needed for mild pain (pain  score 1-3).   amLODipine 5 MG tablet Commonly known as: NORVASC Take 1 tablet (5 mg total) by mouth daily. What changed: additional instructions Changed by: Fransisca Kaufmann Bailea Beed, MD   aspirin EC 81 MG tablet Take 81 mg by mouth daily. Swallow whole.   atorvastatin 40 MG tablet Commonly known as: LIPITOR Take 1 tablet (40 mg total) by mouth daily.   clopidogrel 75 MG tablet Commonly known as: PLAVIX Take 1 tablet (75 mg total) by mouth daily.   DULoxetine 30 MG capsule Commonly known as: Cymbalta Take 1 capsule (30 mg total) by mouth at bedtime.   ferrous sulfate 325 (65 FE) MG tablet Take 1 tablet (325 mg total) by mouth 2 (two) times daily with a meal.   ONE-A-DAY  50 PLUS PO Take by mouth daily.   polyethylene glycol 17 g packet Commonly known as: MIRALAX / GLYCOLAX Take 17 g by mouth daily as needed for mild constipation.   tamsulosin 0.4 MG Caps capsule Commonly known as: FLOMAX Take 1 capsule (0.4 mg total) by mouth daily after breakfast.         Objective:   BP (!) 143/76    Pulse 92    Ht $R'6\' 1"'XY$  (1.854 m)    Wt 185 lb (83.9 kg)    SpO2 98%    BMI 24.41 kg/m   Wt Readings from Last 3 Encounters:  05/17/21 185 lb (83.9 kg)  12/16/20 180 lb (81.6 kg)  12/04/20 172 lb (78 kg)    Physical Exam Vitals and nursing note reviewed.  Constitutional:      General: He is not in acute distress.    Appearance: He is well-developed. He is not diaphoretic.  Eyes:     General: No scleral icterus.    Conjunctiva/sclera: Conjunctivae normal.  Neck:     Thyroid: No thyromegaly.  Cardiovascular:     Rate and Rhythm: Normal rate and regular rhythm.     Heart sounds: Normal heart sounds. No murmur heard. Pulmonary:     Effort: Pulmonary effort is normal. No respiratory distress.     Breath sounds: Normal breath sounds. No wheezing.  Musculoskeletal:        General: Normal range of motion.     Cervical back: Neck supple.  Lymphadenopathy:     Cervical: No cervical adenopathy.  Skin:    General: Skin is warm and dry.     Findings: No rash.  Neurological:     Mental Status: He is alert. Mental status is at baseline. He is confused.     Coordination: Coordination normal.  Psychiatric:        Behavior: Behavior normal.      Assessment & Plan:   Problem List Items Addressed This Visit       Cardiovascular and Mediastinum   HTN (hypertension)   Relevant Medications   amLODipine (NORVASC) 5 MG tablet   Other Relevant Orders   CBC with Differential/Platelet   CMP14+EGFR     Respiratory   COPD (chronic obstructive pulmonary disease) ([place]) - Primary   Relevant Orders   CBC with Differential/Platelet   CMP14+EGFR     Other    Depression, recurrent (HCC)   Hyperlipidemia, mixed   Relevant Medications   amLODipine (NORVASC) 5 MG tablet   Other Relevant Orders   Lipid panel    Continue current medicine.  We will do blood work, no changes Follow up plan: Return in about 3 months (around 08/14/2021), or if symptoms worsen or fail  to improve, for Hypertension and hyperlipidemia.  Counseling provided for all of the vaccine components Orders Placed This Encounter  Procedures   CBC with Differential/Platelet   CMP14+EGFR   Lipid panel    Caryl Pina, MD [facility] 05/17/2021, 1:39 PM

## 2021-05-18 LAB — CMP14+EGFR
ALT: 10 IU/L (ref 0–44)
AST: 10 IU/L (ref 0–40)
Albumin/Globulin Ratio: 1.5 (ref 1.2–2.2)
Albumin: 4.3 g/dL (ref 3.7–4.7)
Alkaline Phosphatase: 126 IU/L — ABNORMAL HIGH (ref 44–121)
BUN/Creatinine Ratio: 11 (ref 10–24)
BUN: 10 mg/dL (ref 8–27)
Bilirubin Total: 0.4 mg/dL (ref 0.0–1.2)
CO2: 22 mmol/L (ref 20–29)
Calcium: 9.6 mg/dL (ref 8.6–10.2)
Chloride: 103 mmol/L (ref 96–106)
Creatinine, Ser: 0.95 mg/dL (ref 0.76–1.27)
Globulin, Total: 2.8 g/dL (ref 1.5–4.5)
Glucose: 90 mg/dL (ref 70–99)
Potassium: 4.4 mmol/L (ref 3.5–5.2)
Sodium: 141 mmol/L (ref 134–144)
Total Protein: 7.1 g/dL (ref 6.0–8.5)
eGFR: 85 mL/min/{1.73_m2} (ref >59)

## 2021-05-18 LAB — CBC WITH DIFFERENTIAL/PLATELET
Basophils Absolute: 0.1 10*3/uL (ref 0.0–0.2)
Basos: 1 %
EOS (ABSOLUTE): 0.2 10*3/uL (ref 0.0–0.4)
Eos: 2 %
Hematocrit: 47.6 % (ref 37.5–51.0)
Hemoglobin: 16.2 g/dL (ref 13.0–17.7)
Immature Grans (Abs): 0 10*3/uL (ref 0.0–0.1)
Immature Granulocytes: 0 %
Lymphocytes Absolute: 2.2 10*3/uL (ref 0.7–3.1)
Lymphs: 30 %
MCH: 30.5 pg (ref 26.6–33.0)
MCHC: 34 g/dL (ref 31.5–35.7)
MCV: 90 fL (ref 79–97)
Monocytes Absolute: 0.9 10*3/uL (ref 0.1–0.9)
Monocytes: 12 %
Neutrophils Absolute: 4.1 10*3/uL (ref 1.4–7.0)
Neutrophils: 55 %
Platelets: 277 10*3/uL (ref 150–450)
RBC: 5.32 x10E6/uL (ref 4.14–5.80)
RDW: 12.7 % (ref 11.6–15.4)
WBC: 7.4 10*3/uL (ref 3.4–10.8)

## 2021-05-18 LAB — LIPID PANEL
Chol/HDL Ratio: 4.7 ratio (ref 0.0–5.0)
Cholesterol, Total: 150 mg/dL (ref 100–199)
HDL: 32 mg/dL — ABNORMAL LOW (ref >39)
LDL Chol Calc (NIH): 95 mg/dL (ref 0–99)
Triglycerides: 125 mg/dL (ref 0–149)
VLDL Cholesterol Cal: 23 mg/dL (ref 5–40)

## 2021-05-24 ENCOUNTER — Other Ambulatory Visit: Payer: Self-pay | Admitting: Family Medicine

## 2021-05-24 DIAGNOSIS — I10 Essential (primary) hypertension: Secondary | ICD-10-CM

## 2021-05-31 ENCOUNTER — Telehealth: Payer: Medicare HMO

## 2021-06-07 DIAGNOSIS — R6889 Other general symptoms and signs: Secondary | ICD-10-CM | POA: Diagnosis not present

## 2021-06-07 DIAGNOSIS — N401 Enlarged prostate with lower urinary tract symptoms: Secondary | ICD-10-CM | POA: Diagnosis not present

## 2021-06-07 DIAGNOSIS — R3914 Feeling of incomplete bladder emptying: Secondary | ICD-10-CM | POA: Diagnosis not present

## 2021-06-10 DIAGNOSIS — Z85828 Personal history of other malignant neoplasm of skin: Secondary | ICD-10-CM | POA: Diagnosis not present

## 2021-06-10 DIAGNOSIS — Z08 Encounter for follow-up examination after completed treatment for malignant neoplasm: Secondary | ICD-10-CM | POA: Diagnosis not present

## 2021-07-05 ENCOUNTER — Telehealth: Payer: Medicare HMO

## 2021-08-24 ENCOUNTER — Ambulatory Visit (INDEPENDENT_AMBULATORY_CARE_PROVIDER_SITE_OTHER): Payer: Medicare HMO

## 2021-08-24 VITALS — Wt 185.0 lb

## 2021-08-24 DIAGNOSIS — Z0000 Encounter for general adult medical examination without abnormal findings: Secondary | ICD-10-CM | POA: Diagnosis not present

## 2021-08-24 NOTE — Patient Instructions (Signed)
Walter [name] , Thank you for taking time to come for your Medicare Wellness Visit. I appreciate your ongoing commitment to your health goals. Please review the following plan we discussed and let me know if I can assist you in the future.   Screening recommendations/referrals: declines Colonoscopy: Declined - consider at home Cologuard test Recommended yearly ophthalmology/optometry visit for glaucoma screening and checkup Recommended yearly dental visit for hygiene and checkup  Vaccinations: declines all Influenza vaccine: recommend every Fall Pneumococcal vaccine: recommend once per lifetime Prevnar-20 Tdap vaccine: recommend every 10 years Shingles vaccine: recommend Shingrix which is 2 doses 2-6 months apart and over 90% effective     Covid-19: recommend 2 doses one month apart with a booster 6 months later  Advanced directives: Advance directive discussed with you today. Even though you declined this today, please call our office should you change your mind, and we can give you the proper paperwork for you to fill out.   Conditions/risks identified: Aim for 4-6 glasses of water, plenty of protein in your diet and try to get up and walk/ stretch every hour for 5-10 minutes at a time.  Next appointment: Follow up in one year for your annual wellness visit.   Preventive Care 87 Years and Older, Male  Preventive care refers to lifestyle choices and visits with your health care provider that can promote health and wellness. What does preventive care include? A yearly physical exam. This is also called an annual well check. Dental exams once or twice a year. Routine eye exams. Ask your health care provider how often you should have your eyes checked. Personal lifestyle choices, including: Daily care of your teeth and gums. Regular physical activity. Eating a healthy diet. Avoiding tobacco and drug use. Limiting alcohol use. Practicing safe sex. Taking low doses of aspirin every  day. Taking vitamin and mineral supplements as recommended by your health care provider. What happens during an annual well check? The services and screenings done by your health care provider during your annual well check will depend on your age, overall health, lifestyle risk factors, and family history of disease. Counseling  Your health care provider may ask you questions about your: Alcohol use. Tobacco use. Drug use. Emotional well-being. Home and relationship well-being. Sexual activity. Eating habits. History of falls. Memory and ability to understand (cognition). Work and work Statistician. Screening  You may have the following tests or measurements: Height, weight, and BMI. Blood pressure. Lipid and cholesterol levels. These may be checked every 5 years, or more frequently if you are over 18 years old. Skin check. Lung cancer screening. You may have this screening every year starting at age 90 if you have a 30-pack-year history of smoking and currently smoke or have quit within the past 15 years. Fecal occult blood test (FOBT) of the stool. You may have this test every year starting at age 11. Flexible sigmoidoscopy or colonoscopy. You may have a sigmoidoscopy every 5 years or a colonoscopy every 10 years starting at age 9. Prostate cancer screening. Recommendations will vary depending on your family history and other risks. Hepatitis C blood test. Hepatitis B blood test. Sexually transmitted disease (STD) testing. Diabetes screening. This is done by checking your blood sugar (glucose) after you have not eaten for a while (fasting). You may have this done every 1-3 years. Abdominal aortic aneurysm (AAA) screening. You may need this if you are a current or former smoker. Osteoporosis. You may be screened starting at age 28 if  you are at high risk. Talk with your health care provider about your test results, treatment options, and if necessary, the need for more  tests. Vaccines  Your health care provider may recommend certain vaccines, such as: Influenza vaccine. This is recommended every year. Tetanus, diphtheria, and acellular pertussis (Tdap, Td) vaccine. You may need a Td booster every 10 years. Zoster vaccine. You may need this after age 35. Pneumococcal 13-valent conjugate (PCV13) vaccine. One dose is recommended after age 37. Pneumococcal polysaccharide (PPSV23) vaccine. One dose is recommended after age 2. Talk to your health care provider about which screenings and vaccines you need and how often you need them. This information is not intended to replace advice given to you by your health care provider. Make sure you discuss any questions you have with your health care provider. Document Released: 04/17/2015 Document Revised: 12/09/2015 Document Reviewed: 01/20/2015 Elsevier Interactive Patient Education  2017 [employer] Prevention in the Home Falls can cause injuries. They can happen to people of all ages. There are many things you can do to make your home safe and to help prevent falls. What can I do on the outside of my home? Regularly fix the edges of walkways and driveways and fix any cracks. Remove anything that might make you trip as you walk through a door, such as a raised step or threshold. Trim any bushes or trees on the path to your home. Use bright outdoor lighting. Clear any walking paths of anything that might make someone trip, such as rocks or tools. Regularly check to see if handrails are loose or broken. Make sure that both sides of any steps have handrails. Any raised decks and porches should have guardrails on the edges. Have any leaves, snow, or ice cleared regularly. Use sand or salt on walking paths during winter. Clean up any spills in your garage right away. This includes oil or grease spills. What can I do in the bathroom? Use night lights. Install grab bars by the toilet and in the tub and shower.  Do not use towel bars as grab bars. Use non-skid mats or decals in the tub or shower. If you need to sit down in the shower, use a plastic, non-slip stool. Keep the floor dry. Clean up any water that spills on the floor as soon as it happens. Remove soap buildup in the tub or shower regularly. Attach bath mats securely with double-sided non-slip rug tape. Do not have throw rugs and other things on the floor that can make you trip. What can I do in the bedroom? Use night lights. Make sure that you have a light by your bed that is easy to reach. Do not use any sheets or blankets that are too big for your bed. They should not hang down onto the floor. Have a firm chair that has side arms. You can use this for support while you get dressed. Do not have throw rugs and other things on the floor that can make you trip. What can I do in the kitchen? Clean up any spills right away. Avoid walking on wet floors. Keep items that you use a lot in easy-to-reach places. If you need to reach something above you, use a strong step stool that has a grab bar. Keep electrical cords out of the way. Do not use floor polish or wax that makes floors slippery. If you must use wax, use non-skid floor wax. Do not have throw rugs and other things on  the floor that can make you trip. What can I do with my stairs? Do not leave any items on the stairs. Make sure that there are handrails on both sides of the stairs and use them. Fix handrails that are broken or loose. Make sure that handrails are as long as the stairways. Check any carpeting to make sure that it is firmly attached to the stairs. Fix any carpet that is loose or worn. Avoid having throw rugs at the top or bottom of the stairs. If you do have throw rugs, attach them to the floor with carpet tape. Make sure that you have a light switch at the top of the stairs and the bottom of the stairs. If you do not have them, ask someone to add them for you. What else  can I do to help prevent falls? Wear shoes that: Do not have high heels. Have rubber bottoms. Are comfortable and fit you well. Are closed at the toe. Do not wear sandals. If you use a stepladder: Make sure that it is fully opened. Do not climb a closed stepladder. Make sure that both sides of the stepladder are locked into place. Ask someone to hold it for you, if possible. Clearly mark and make sure that you can see: Any grab bars or handrails. First and last steps. Where the edge of each step is. Use tools that help you move around (mobility aids) if they are needed. These include: Canes. Walkers. Scooters. Crutches. Turn on the lights when you go into a dark area. Replace any light bulbs as soon as they burn out. Set up your furniture so you have a clear path. Avoid moving your furniture around. If any of your floors are uneven, fix them. If there are any pets around you, be aware of where they are. Review your medicines with your doctor. Some medicines can make you feel dizzy. This can increase your chance of falling. Ask your doctor what other things that you can do to help prevent falls. This information is not intended to replace advice given to you by your health care provider. Make sure you discuss any questions you have with your health care provider. Document Released: 01/15/2009 Document Revised: 08/27/2015 Document Reviewed: 04/25/2014 Elsevier Interactive Patient Education  2017 [employer].

## 2021-08-24 NOTE — Progress Notes (Signed)
Subjective:   Walter [name] is a 71 y.o. male who presents for Medicare Annual/Subsequent preventive examination.  Virtual Visit via Telephone Note  I connected with  Walter [name] on 08/24/21 at  1:15 PM EDT by telephone and verified that I am speaking with the correct person using two identifiers.  Location: Patient: Home Provider: [facility] Persons participating in the virtual visit: patient, his wife, [name] and Nurse Health Advisor   I discussed the limitations, risks, security and privacy concerns of performing an evaluation and management service by telephone and the availability of in person appointments. The patient expressed understanding and agreed to proceed.  Interactive audio and video telecommunications were attempted between this nurse and patient, however failed, due to patient having technical difficulties OR patient did not have access to video capability.  We continued and completed visit with audio only.  Some vital signs may be absent or patient reported.   Shamela Haydon E Nollan Muldrow, LPN   Review of Systems     Cardiac Risk Factors include: advanced age (>71mn, >>29women);sedentary lifestyle;male gender;dyslipidemia;hypertension;Other (see comment), Risk factor comments: COPD, hx of CVA, dementia     Objective:    Today's Vitals   08/24/21 1318  Weight: 185 lb (83.9 kg)   Body mass index is 24.41 kg/m.     08/24/2021    1:33 PM 08/21/2020    2:53 PM 09/07/2019    4:00 PM 03/21/2019    1:15 PM 12/14/2017    4:12 PM 02/12/2014   12:33 AM 02/11/2014   12:12 PM  Advanced Directives  Does Patient Have a Medical Advance Directive? No Yes No No No No No  Type of ASocial research officer, governmentLiving will       Copy of [place] Chart?  No - copy requested       Would patient like information on creating a medical advance directive? No - Patient declined  No - Patient declined  No - Patient declined No - patient declined information No  - patient declined information    Current Medications (verified) Outpatient Encounter Medications as of 08/24/2021  Medication Sig   acetaminophen (TYLENOL) 325 MG tablet Take 1-2 tablets (325-650 mg total) by mouth every 6 (six) hours as needed for mild pain (pain score 1-3).   amLODipine (NORVASC) 5 MG tablet Take 1 tablet (5 mg total) by mouth daily.   aspirin EC 81 MG tablet Take 81 mg by mouth daily. Swallow whole.   atorvastatin (LIPITOR) 40 MG tablet Take 1 tablet (40 mg total) by mouth daily.   clopidogrel (PLAVIX) 75 MG tablet Take 1 tablet (75 mg total) by mouth daily.   Multiple Vitamins-Minerals (ONE-A-DAY 50 PLUS PO) Take by mouth daily.   polyethylene glycol (MIRALAX / GLYCOLAX) 17 g packet Take 17 g by mouth daily as needed for mild constipation.   tamsulosin (FLOMAX) 0.4 MG CAPS capsule Take 1 capsule (0.4 mg total) by mouth daily after breakfast.   DULoxetine (CYMBALTA) 30 MG capsule Take 1 capsule (30 mg total) by mouth at bedtime. (Patient not taking: Reported on 08/24/2021)   [DISCONTINUED] ferrous sulfate 325 (65 FE) MG tablet Take 1 tablet (325 mg total) by mouth 2 (two) times daily with a meal. (Patient not taking: Reported on 08/24/2021)   No facility-administered encounter medications on file as of 08/24/2021.    Allergies (verified) Patient has no known allergies.   History: Past Medical History:  Diagnosis Date   Acute  urinary retention 02/12/2014   Brain TIA    recurrent    C. difficile enteritis    Carotid bruit    COPD (chronic obstructive pulmonary disease) (HCC)    Coronary artery disease    Myocardial infarction ([place])    incidental   Nodule of kidney 02/14/14   solid on left   Recurrent colitis due to Clostridium difficile 02/11/2014   Stroke ([place]) 1997   Past Surgical History:  Procedure Laterality Date   BACK SURGERY     HIP ARTHROPLASTY Left 09/09/2019   Procedure: ARTHROPLASTY BIPOLAR HIP (HEMIARTHROPLASTY);  Surgeon: Paralee Cancel, MD;   Location: [facility];  Service: Orthopedics;  Laterality: Left;   KNEE SURGERY     Family History  Problem Relation Age of Onset   Cancer Father 42       stomach   Coronary artery disease Mother 3   Stroke Mother    Coronary artery disease Brother 78       AMI deceased   Heart attack Daughter    Social History   Socioeconomic History   Marital status: Married    Spouse name: Not on file   Number of children: 1   Years of education: 10   Highest education level: 10th grade  Occupational History   Occupation: truck Geophysicist/field seismologist    Comment: unable becasue of CVA   Occupation: disability    Comment: since 2000  Tobacco Use   Smoking status: Former    Packs/day: 1.00    Years: 55.00    Pack years: 55.00    Types: Cigarettes   Smokeless tobacco: Never  Vaping Use   Vaping Use: Never used  Substance and Sexual Activity   Alcohol use: No   Drug use: No   Sexual activity: Not Currently  Other Topics Concern   Not on file  Social History Narrative   One level living and handicap accessible bathroom - had stroke in 2021 - speech impaired, impaired mobility   Social Determinants of Health   Financial Resource Strain: Low Risk    Difficulty of Paying Living Expenses: Not hard at all  Food Insecurity: No Food Insecurity   Worried About Charity fundraiser in the Last Year: Never true   [place] in the Last Year: Never true  Transportation Needs: No Transportation Needs   Lack of Transportation (Medical): No   Lack of Transportation (Non-Medical): No  Physical Activity: Inactive   Days of Exercise per Week: 0 days   Minutes of Exercise per Session: 0 min  Stress: Stress Concern Present   Feeling of Stress : To some extent  Social Connections: Moderately Isolated   Frequency of Communication with Friends and Family: Never   Frequency of Social Gatherings with Friends and Family: More than three times a week   Attends Religious Services: Never   Marine scientist or  Organizations: No   Attends Music therapist: Never   Marital Status: Married    Tobacco Counseling Counseling given: Not Answered   Clinical Intake:  Pre-visit preparation completed: Yes  Pain : No/denies pain     BMI - recorded: 24.41 Nutritional Status: BMI of 19-24  Normal Nutritional Risks: None Diabetes: No  How often do you need to have someone help you when you read instructions, pamphlets, or other written materials from your doctor or pharmacy?: 3 - Sometimes  Diabetic? no  Interpreter Needed?: No  Comments: wife, [name] assists with visits Information entered by ::  Dakhari Zuver, LPN   Activities of Daily Living    08/24/2021    1:33 PM  In your present state of health, do you have any difficulty performing the following activities:  Hearing? 0  Vision? 0  Difficulty concentrating or making decisions? 1  Walking or climbing stairs? 1  Dressing or bathing? 0  Comment He can do this slowly, holding on to something, rarely needs assistance  Doing errands, shopping? 1  Comment wife Restaurant manager, fast food and eating ? Y  Using the Toilet? Y  Comment He goes, but often doesn't make it - wears depends  In the past six months, have you accidently leaked urine? Y  Comment wears depends  Do you have problems with loss of bowel control? Y  Comment wears depends  Managing your Medications? Y  Managing your Finances? Y  Housekeeping or managing your Housekeeping? Y    Patient Care Team: Dettinger, Fransisca Kaufmann, MD as PCP - General (Family Medicine) Gala Romney Cristopher Estimable, MD as Consulting Physician (Gastroenterology) Shea Evans [name], LCSW as Social Worker (Licensed Clinical Social Worker) Ilean China, RN as Case Manager Allyn Kenner, MD (Dermatology) Hollace Hayward, NP as Nurse Practitioner (Urology)  Indicate any recent [place] you may have received from other than [facility] providers in the past year (date may be approximate).      Assessment:   This is a routine wellness examination for Vincent.  Hearing/Vision screen Hearing Screening - Comments:: Denies hearing difficulties   Vision Screening - Comments:: Wears otc reading glasses prn - up to date with routine eye exams with [facility] in [place]  Dietary issues and exercise activities discussed: Current Exercise Habits: The patient does not participate in regular exercise at present, Exercise limited by: psychological condition(s);neurologic condition(s);respiratory conditions(s)   Goals Addressed             This Visit's Progress    DIET - DECREASE SODA OR JUICE INTAKE   Not on track    DIET - INCREASE WATER INTAKE   Not on track    Exercise 150 min/wk Moderate Activity   Not on track      Depression Screen    08/24/2021    1:31 PM 05/17/2021    1:14 PM 05/11/2021   11:44 AM 03/31/2021    4:35 PM 01/06/2021    1:01 PM 12/16/2020   11:32 AM 12/04/2020   11:14 AM  PHQ 2/9 Scores  PHQ - 2 Score '4 4 2 2 2 4 2  '$ PHQ- 9 Score '17 19 10 8 10 12 7    '$ Fall Risk    08/24/2021    1:20 PM 05/17/2021    1:13 PM 12/16/2020   11:26 AM 12/04/2020   11:14 AM 08/21/2020    2:52 PM  Fall Risk   Falls in the past year? '1 1 1 '$ 0 1  Number falls in past yr: '1 1 1  1  '$ Injury with Fall? 0 0 1  1  Risk for fall due to : History of fall(s);Impaired balance/gait;Orthopedic patient;Mental status change History of fall(s);Impaired balance/gait Impaired balance/gait;Impaired mobility;History of fall(s)  History of fall(s);Impaired mobility;Impaired vision;Medication side effect;Mental status change;Orthopedic patient;Impaired balance/gait  Follow up Education provided;Falls prevention discussed Falls evaluation completed Falls prevention discussed  Falls prevention discussed;Education provided    FALL RISK PREVENTION PERTAINING TO THE HOME:  Any stairs in or around the home? No  If so, are there any without handrails? No  Home free of loose  throw rugs in walkways, pet beds, electrical  cords, etc? Yes  Adequate lighting in your home to reduce risk of falls? Yes   ASSISTIVE DEVICES UTILIZED TO PREVENT FALLS:  Life alert? No  Use of a cane, walker or w/c? Yes  Grab bars in the bathroom? Yes  Shower chair or bench in shower? Yes  Elevated toilet seat or a handicapped toilet? Yes   TIMED UP AND GO:  Was the test performed? No . Telephonic visit  Cognitive Function:    08/24/2021    1:36 PM 12/14/2017    4:26 PM  MMSE - Mini Mental State Exam  Not completed: Unable to complete   Orientation to time  4  Orientation to Place  4  Registration  3  Attention/ Calculation  5  Recall  2  Language- name 2 objects  2  Language- repeat  1  Language- follow 3 step command  3  Language- read & follow direction  1  Write a sentence  1  Copy design  1  Total score  27        Immunizations There is no immunization history for the selected administration types on file for this patient.  TDAP status: Due, Education has been provided regarding the importance of this vaccine. Advised may receive this vaccine at local pharmacy or Health Dept. Aware to provide a copy of the vaccination record if obtained from local pharmacy or Health Dept. Verbalized acceptance and understanding.  Flu Vaccine status: Declined, Education has been provided regarding the importance of this vaccine but patient still declined. Advised may receive this vaccine at local pharmacy or Health Dept. Aware to provide a copy of the vaccination record if obtained from local pharmacy or Health Dept. Verbalized acceptance and understanding.  Pneumococcal vaccine status: Declined,  Education has been provided regarding the importance of this vaccine but patient still declined. Advised may receive this vaccine at local pharmacy or Health Dept. Aware to provide a copy of the vaccination record if obtained from local pharmacy or Health Dept. Verbalized acceptance and understanding.   Covid-19 vaccine status:  Declined, Education has been provided regarding the importance of this vaccine but patient still declined. Advised may receive this vaccine at local pharmacy or Health Dept.or vaccine clinic. Aware to provide a copy of the vaccination record if obtained from local pharmacy or Health Dept. Verbalized acceptance and understanding.  Qualifies for Shingles Vaccine? Yes   Zostavax completed No   Shingrix Completed?: No.    Education has been provided regarding the importance of this vaccine. Patient has been advised to call insurance company to determine out of pocket expense if they have not yet received this vaccine. Advised may also receive vaccine at local pharmacy or Health Dept. Verbalized acceptance and understanding.  Screening Tests Health Maintenance  Topic Date Due   COLONOSCOPY (Pts 45-44yr Insurance coverage will need to be confirmed)  12/04/2021 (Originally 09/12/1993)   TETANUS/TDAP  12/04/2021 (Originally 09/13/1967)   Hepatitis C Screening  12/04/2021 (Originally 09/13/1966)   Pneumonia Vaccine 73 Years old (1 - PCV) 05/17/2022 (Originally 09/13/1956)   COVID-19 Vaccine (1) 06/13/2022 (Originally 03/14/1951)   Zoster Vaccines- Shingrix (1 of 2) 08/15/2022 (Originally 09/13/1967)   HPV VACCINES  Aged Out   INFLUENZA VACCINE  Discontinued    Health Maintenance  There are no preventive care reminders to display for this patient.  Colorectal cancer screening: No longer required.  He declines - recommended at home Cologuard - they'll consider  Lung  Cancer Screening: (Low Dose CT Chest recommended if Age 23-80 years, 30 pack-year currently smoking OR have quit w/in 15years.) does qualify.   Lung Cancer Screening Referral: declines  Additional Screening:  Hepatitis C Screening: does qualify; declines  Vision Screening: Recommended annual ophthalmology exams for early detection of glaucoma and other disorders of the eye. Is the patient up to date with their annual eye exam?  Yes   Who is the provider or what is the name of the office in which the patient attends annual eye exams? [facility] in [place] If pt is not established with a provider, would they like to be referred to a provider to establish care? No .   Dental Screening: Recommended annual dental exams for proper oral hygiene  Community Resource Referral / Chronic Care Management: CRR required this visit?  No   CCM required this visit?  No      Plan:     I have personally reviewed and noted the following in the patient's chart:   Medical and social history Use of alcohol, tobacco or illicit drugs  Current medications and supplements including opioid prescriptions. Patient is not currently taking opioid prescriptions. Functional ability and status Nutritional status Physical activity Advanced directives List of other physicians Hospitalizations, surgeries, and ER visits in previous 12 months Vitals Screenings to include cognitive, depression, and falls Referrals and appointments  In addition, I have reviewed and discussed with patient certain preventive protocols, quality metrics, and best practice recommendations. A written personalized care plan for preventive services as well as general preventive health recommendations were provided to patient.     Sandrea Hammond, LPN   5/40/9811   Nurse Notes: None

## 2021-11-10 ENCOUNTER — Ambulatory Visit: Payer: Self-pay | Admitting: *Deleted

## 2021-11-10 NOTE — Chronic Care Management (AMB) (Signed)
  Chronic Care Management   Note  11/10/2021 Name: Walter [name] MRN: [id number] DOB: [birth year]   Due to changes in the Chronic Care Management program, I am removing myself as the [place] from the Care Team and closing [facility]. Patient was not scheduled to be followed by the RN Care Coordination nurse for [facility].   Patient has an open Care Plan with another CCM team member, [name], LCSW.  I will forward this case closure encounter to the other team member(s). Patient does not have a current CCM referral placed since 08/02/21. CCM enrollment status changed to "not enrolled".   Patient's PCP can place a new referral if the they needs Care Management or Care Coordination services in the future.  [name], BSN, RN-BC Proofreader Dial: [phone]

## 2021-11-10 NOTE — Patient Instructions (Signed)
Walter [name]  At some point during the past 4 years, I have worked with you through the [facility] Management Program at [facility].  Due to program changes I am removing myself from your care team.   If you are currently active with another CCM Team Member, you will remain active with them unless they reach out to you with additional information.   If you feel that you need services in the future,  please talk with your primary care provider and request a new referral for Care Management or Care Coordination services. This does not affect your status as a patient at [facility].   Thank you for allowing me to participate in your your healthcare journey.  [name], BSN, RN-BC Proofreader Dial: [phone]

## 2021-11-15 ENCOUNTER — Encounter: Payer: Self-pay | Admitting: Family Medicine

## 2021-11-15 ENCOUNTER — Ambulatory Visit (INDEPENDENT_AMBULATORY_CARE_PROVIDER_SITE_OTHER): Payer: Medicare HMO | Admitting: Family Medicine

## 2021-11-15 VITALS — BP 146/64 | HR 76 | Temp 97.2°F | Ht 73.0 in | Wt 191.0 lb

## 2021-11-15 DIAGNOSIS — I10 Essential (primary) hypertension: Secondary | ICD-10-CM | POA: Diagnosis not present

## 2021-11-15 DIAGNOSIS — Z8673 Personal history of transient ischemic attack (TIA), and cerebral infarction without residual deficits: Secondary | ICD-10-CM

## 2021-11-15 DIAGNOSIS — J449 Chronic obstructive pulmonary disease, unspecified: Secondary | ICD-10-CM | POA: Diagnosis not present

## 2021-11-15 DIAGNOSIS — F01B3 Vascular dementia, moderate, with mood disturbance: Secondary | ICD-10-CM

## 2021-11-15 DIAGNOSIS — E782 Mixed hyperlipidemia: Secondary | ICD-10-CM

## 2021-11-15 MED ORDER — CLOPIDOGREL BISULFATE 75 MG PO TABS: 75.0000 mg | ORAL_TABLET | Freq: Every day | ORAL | 2 refills | Status: DC

## 2021-11-15 MED ORDER — ATORVASTATIN CALCIUM 40 MG PO TABS: 40.0000 mg | ORAL_TABLET | Freq: Every day | ORAL | 3 refills | Status: DC

## 2021-11-15 NOTE — Progress Notes (Signed)
BP (!) 146/64   Pulse 76   Temp (!) 97.2 F (36.2 C)   Ht 6\' 1"  (1.854 m)   Wt 191 lb (86.6 kg)   SpO2 96%   BMI 25.20 kg/m    Subjective:   Patient ID: Walter [name], male    DOB: 10/23/1950, 71 y.o.   MRN: [id number]  HPI: Walter [name] is a 71 y.o. male presenting on 11/15/2021 for Medical Management of Chronic Issues, Hypertension, Hyperlipidemia, and walking worse   HPI COPD Patient is coming in for COPD recheck today.  He is currently on no medication and has been doing okay, has a little bit of congestion chronically.  He has a mild chronic cough but denies any major coughing spells or wheezing spells.  He has 0 nighttime symptoms per week and 0 daytime symptoms per week currently.   Hypertension Patient is currently on amlodipine, and their blood pressure today is 146/64. Patient denies any lightheadedness or dizziness. Patient denies headaches, blurred vision, chest pains, shortness of breath, or weakness. Denies any side effects from medication and is content with current medication.   Hyperlipidemia Patient is coming in for recheck of his hyperlipidemia. The patient is currently taking atorvastatin. They deny any issues with myalgias or history of liver damage from it. They deny any focal numbness or weakness or chest pain.   Vascular dementia with mood Patient has vascular dementia Memory is gradually worsening but she says he has been doing fine with mood.  He just does not like to get up and do stuff to walk but he has been fine with his mood.  His wife gives a history  Relevant past medical, surgical, family and social history reviewed and updated as indicated. Interim medical history since our last visit reviewed. Allergies and medications reviewed and updated.  Review of Systems  Constitutional:  Negative for chills and fever.  Eyes:  Negative for visual disturbance.  Respiratory:  Negative for shortness of breath and wheezing.   Cardiovascular:  Negative for  chest pain and leg swelling.  Musculoskeletal:  Negative for back pain and gait problem.  Skin:  Negative for rash.  Neurological:  Negative for dizziness, weakness and light-headedness.  All other systems reviewed and are negative.   Per HPI unless specifically indicated above   Allergies as of 11/15/2021   No Known Allergies      Medication List        Accurate as of November 15, 2021  1:31 PM. If you have any questions, ask your nurse or doctor.          STOP taking these medications    DULoxetine 30 MG capsule Commonly known as: Cymbalta Stopped by: November 17, 2021 Kullen Tomasetti, MD   polyethylene glycol 17 g packet Commonly known as: MIRALAX / GLYCOLAX Stopped by: Elige Radon Kenyada Hy, MD       TAKE these medications    acetaminophen 325 MG tablet Commonly known as: TYLENOL Take 1-2 tablets (325-650 mg total) by mouth every 6 (six) hours as needed for mild pain (pain score 1-3).   amLODipine 5 MG tablet Commonly known as: NORVASC Take 1 tablet (5 mg total) by mouth daily.   aspirin EC 81 MG tablet Take 81 mg by mouth daily. Swallow whole.   atorvastatin 40 MG tablet Commonly known as: LIPITOR Take 1 tablet (40 mg total) by mouth daily.   clopidogrel 75 MG tablet Commonly known as: PLAVIX Take 1 tablet (75 mg total) by mouth  daily.   ONE-A-DAY 50 PLUS PO Take by mouth daily.   tamsulosin 0.4 MG Caps capsule Commonly known as: FLOMAX Take 1 capsule (0.4 mg total) by mouth daily after breakfast.         Objective:   BP (!) 146/64   Pulse 76   Temp (!) 97.2 F (36.2 C)   Ht $R'6\' 1"'UZ$  (1.854 m)   Wt 191 lb (86.6 kg)   SpO2 96%   BMI 25.20 kg/m   Wt Readings from Last 3 Encounters:  11/15/21 191 lb (86.6 kg)  08/24/21 185 lb (83.9 kg)  05/17/21 185 lb (83.9 kg)    Physical Exam Vitals and nursing note reviewed.  Constitutional:      General: He is not in acute distress.    Appearance: He is well-developed. He is not diaphoretic.  Eyes:      General: No scleral icterus.    Conjunctiva/sclera: Conjunctivae normal.  Neck:     Thyroid: No thyromegaly.  Cardiovascular:     Rate and Rhythm: Normal rate and regular rhythm.     Heart sounds: Normal heart sounds. No murmur heard. Pulmonary:     Effort: Pulmonary effort is normal. No respiratory distress.     Breath sounds: Normal breath sounds. No wheezing.  Musculoskeletal:        General: No swelling. Normal range of motion.     Cervical back: Neck supple.  Lymphadenopathy:     Cervical: No cervical adenopathy.  Skin:    General: Skin is warm and dry.     Findings: No rash.  Neurological:     Mental Status: He is alert and oriented to person, place, and time.     Coordination: Coordination normal.  Psychiatric:        Behavior: Behavior normal.       Assessment & Plan:   Problem List Items Addressed This Visit       Cardiovascular and Mediastinum   HTN (hypertension) - Primary   Relevant Medications   atorvastatin (LIPITOR) 40 MG tablet   Other Relevant Orders   CBC with Differential/Platelet   CMP14+EGFR   Lipid panel     Respiratory   COPD (chronic obstructive pulmonary disease) (HCC)     Nervous and Auditory   Vascular dementia ([place])     Other   Hyperlipidemia, mixed   Relevant Medications   atorvastatin (LIPITOR) 40 MG tablet   Other Relevant Orders   CBC with Differential/Platelet   CMP14+EGFR   Lipid panel   History of CVA (cerebrovascular accident)   Relevant Medications   clopidogrel (PLAVIX) 75 MG tablet    Continue current medicine, no changes, will do blood work Follow up plan: Return in about 6 months (around 05/18/2022), or if symptoms worsen or fail to improve, for Hypertension cholesterol vascular dementia.  Counseling provided for all of the vaccine components Orders Placed This Encounter  Procedures   CBC with Differential/Platelet   CMP14+EGFR   Lipid panel    Caryl Pina, MD [place]  [facility] 11/15/2021, 1:31 PM

## 2021-11-16 LAB — CBC WITH DIFFERENTIAL/PLATELET
Basophils Absolute: 0.1 10*3/uL (ref 0.0–0.2)
Basos: 1 %
EOS (ABSOLUTE): 0.1 10*3/uL (ref 0.0–0.4)
Eos: 2 %
Hematocrit: 49.1 % (ref 37.5–51.0)
Hemoglobin: 16.3 g/dL (ref 13.0–17.7)
Immature Grans (Abs): 0 10*3/uL (ref 0.0–0.1)
Immature Granulocytes: 0 %
Lymphocytes Absolute: 1.8 10*3/uL (ref 0.7–3.1)
Lymphs: 23 %
MCH: 30 pg (ref 26.6–33.0)
MCHC: 33.2 g/dL (ref 31.5–35.7)
MCV: 90 fL (ref 79–97)
Monocytes Absolute: 0.7 10*3/uL (ref 0.1–0.9)
Monocytes: 9 %
Neutrophils Absolute: 5.2 10*3/uL (ref 1.4–7.0)
Neutrophils: 65 %
Platelets: 278 10*3/uL (ref 150–450)
RBC: 5.43 x10E6/uL (ref 4.14–5.80)
RDW: 13 % (ref 11.6–15.4)
WBC: 8 10*3/uL (ref 3.4–10.8)

## 2021-11-16 LAB — CMP14+EGFR
ALT: 10 IU/L (ref 0–44)
AST: 10 IU/L (ref 0–40)
Albumin/Globulin Ratio: 1.5 (ref 1.2–2.2)
Albumin: 4.1 g/dL (ref 3.8–4.8)
Alkaline Phosphatase: 92 IU/L (ref 44–121)
BUN/Creatinine Ratio: 9 — ABNORMAL LOW (ref 10–24)
BUN: 9 mg/dL (ref 8–27)
Bilirubin Total: 0.4 mg/dL (ref 0.0–1.2)
CO2: 22 mmol/L (ref 20–29)
Calcium: 9.4 mg/dL (ref 8.6–10.2)
Chloride: 104 mmol/L (ref 96–106)
Creatinine, Ser: 0.95 mg/dL (ref 0.76–1.27)
Globulin, Total: 2.8 g/dL (ref 1.5–4.5)
Glucose: 103 mg/dL — ABNORMAL HIGH (ref 70–99)
Potassium: 4 mmol/L (ref 3.5–5.2)
Sodium: 139 mmol/L (ref 134–144)
Total Protein: 6.9 g/dL (ref 6.0–8.5)
eGFR: 85 mL/min/{1.73_m2} (ref >59)

## 2021-11-16 LAB — LIPID PANEL
Chol/HDL Ratio: 6.2 ratio — ABNORMAL HIGH (ref 0.0–5.0)
Cholesterol, Total: 197 mg/dL (ref 100–199)
HDL: 32 mg/dL — ABNORMAL LOW (ref >39)
LDL Chol Calc (NIH): 141 mg/dL — ABNORMAL HIGH (ref 0–99)
Triglycerides: 130 mg/dL (ref 0–149)
VLDL Cholesterol Cal: 24 mg/dL (ref 5–40)

## 2022-02-06 ENCOUNTER — Other Ambulatory Visit: Payer: Self-pay | Admitting: Family Medicine

## 2022-02-06 DIAGNOSIS — Z8673 Personal history of transient ischemic attack (TIA), and cerebral infarction without residual deficits: Secondary | ICD-10-CM

## 2022-02-25 ENCOUNTER — Observation Stay (HOSPITAL_COMMUNITY): Payer: Medicare HMO

## 2022-02-25 ENCOUNTER — Other Ambulatory Visit: Payer: Self-pay

## 2022-02-25 ENCOUNTER — Emergency Department (HOSPITAL_COMMUNITY): Payer: Medicare HMO

## 2022-02-25 ENCOUNTER — Inpatient Hospital Stay (HOSPITAL_COMMUNITY)
Admission: EM | Admit: 2022-02-25 | Discharge: 2022-03-02 | DRG: 070 | Disposition: A | Discharge status: Skilled Nursing Facility | Payer: Medicare HMO | Attending: Internal Medicine | Admitting: Internal Medicine

## 2022-02-25 ENCOUNTER — Encounter (HOSPITAL_COMMUNITY): Payer: Self-pay

## 2022-02-25 DIAGNOSIS — I639 Cerebral infarction, unspecified: Secondary | ICD-10-CM | POA: Diagnosis not present

## 2022-02-25 DIAGNOSIS — G9341 Metabolic encephalopathy: Principal | ICD-10-CM

## 2022-02-25 DIAGNOSIS — I10 Essential (primary) hypertension: Secondary | ICD-10-CM

## 2022-02-25 DIAGNOSIS — G4710 Hypersomnia, unspecified: Secondary | ICD-10-CM | POA: Diagnosis present

## 2022-02-25 DIAGNOSIS — F32A Depression, unspecified: Secondary | ICD-10-CM | POA: Diagnosis present

## 2022-02-25 DIAGNOSIS — R569 Unspecified convulsions: Secondary | ICD-10-CM | POA: Diagnosis not present

## 2022-02-25 DIAGNOSIS — R410 Disorientation, unspecified: Secondary | ICD-10-CM | POA: Diagnosis not present

## 2022-02-25 DIAGNOSIS — Z8249 Family history of ischemic heart disease and other diseases of the circulatory system: Secondary | ICD-10-CM | POA: Diagnosis not present

## 2022-02-25 DIAGNOSIS — F01518 Vascular dementia, unspecified severity, with other behavioral disturbance: Secondary | ICD-10-CM | POA: Diagnosis present

## 2022-02-25 DIAGNOSIS — R4701 Aphasia: Secondary | ICD-10-CM | POA: Diagnosis not present

## 2022-02-25 DIAGNOSIS — I2510 Atherosclerotic heart disease of native coronary artery without angina pectoris: Secondary | ICD-10-CM

## 2022-02-25 DIAGNOSIS — Z96642 Presence of left artificial hip joint: Secondary | ICD-10-CM | POA: Diagnosis present

## 2022-02-25 DIAGNOSIS — R471 Dysarthria and anarthria: Secondary | ICD-10-CM | POA: Diagnosis not present

## 2022-02-25 DIAGNOSIS — Z823 Family history of stroke: Secondary | ICD-10-CM

## 2022-02-25 DIAGNOSIS — G459 Transient cerebral ischemic attack, unspecified: Secondary | ICD-10-CM | POA: Diagnosis not present

## 2022-02-25 DIAGNOSIS — I252 Old myocardial infarction: Secondary | ICD-10-CM

## 2022-02-25 DIAGNOSIS — Z7902 Long term (current) use of antithrombotics/antiplatelets: Secondary | ICD-10-CM | POA: Diagnosis not present

## 2022-02-25 DIAGNOSIS — F0150 Vascular dementia without behavioral disturbance: Secondary | ICD-10-CM | POA: Diagnosis present

## 2022-02-25 DIAGNOSIS — F01B3 Vascular dementia, moderate, with mood disturbance: Secondary | ICD-10-CM | POA: Diagnosis not present

## 2022-02-25 DIAGNOSIS — Z79899 Other long term (current) drug therapy: Secondary | ICD-10-CM | POA: Diagnosis not present

## 2022-02-25 DIAGNOSIS — Z87891 Personal history of nicotine dependence: Secondary | ICD-10-CM | POA: Diagnosis not present

## 2022-02-25 DIAGNOSIS — R1310 Dysphagia, unspecified: Secondary | ICD-10-CM | POA: Diagnosis present

## 2022-02-25 DIAGNOSIS — I6523 Occlusion and stenosis of bilateral carotid arteries: Secondary | ICD-10-CM | POA: Diagnosis not present

## 2022-02-25 DIAGNOSIS — J9690 Respiratory failure, unspecified, unspecified whether with hypoxia or hypercapnia: Secondary | ICD-10-CM | POA: Diagnosis not present

## 2022-02-25 DIAGNOSIS — Z7401 Bed confinement status: Secondary | ICD-10-CM | POA: Diagnosis not present

## 2022-02-25 DIAGNOSIS — R55 Syncope and collapse: Secondary | ICD-10-CM | POA: Diagnosis not present

## 2022-02-25 DIAGNOSIS — D72829 Elevated white blood cell count, unspecified: Secondary | ICD-10-CM | POA: Diagnosis not present

## 2022-02-25 DIAGNOSIS — E785 Hyperlipidemia, unspecified: Secondary | ICD-10-CM | POA: Diagnosis not present

## 2022-02-25 DIAGNOSIS — F01511 Vascular dementia, unspecified severity, with agitation: Secondary | ICD-10-CM | POA: Diagnosis not present

## 2022-02-25 DIAGNOSIS — Z8673 Personal history of transient ischemic attack (TIA), and cerebral infarction without residual deficits: Secondary | ICD-10-CM | POA: Diagnosis not present

## 2022-02-25 DIAGNOSIS — R0989 Other specified symptoms and signs involving the circulatory and respiratory systems: Secondary | ICD-10-CM | POA: Diagnosis not present

## 2022-02-25 DIAGNOSIS — J439 Emphysema, unspecified: Secondary | ICD-10-CM | POA: Diagnosis not present

## 2022-02-25 DIAGNOSIS — I700 Atherosclerosis of aorta: Secondary | ICD-10-CM | POA: Diagnosis not present

## 2022-02-25 DIAGNOSIS — R29714 NIHSS score 14: Secondary | ICD-10-CM | POA: Diagnosis present

## 2022-02-25 DIAGNOSIS — J449 Chronic obstructive pulmonary disease, unspecified: Secondary | ICD-10-CM | POA: Diagnosis present

## 2022-02-25 DIAGNOSIS — R4182 Altered mental status, unspecified: Secondary | ICD-10-CM | POA: Diagnosis present

## 2022-02-25 DIAGNOSIS — I6389 Other cerebral infarction: Secondary | ICD-10-CM

## 2022-02-25 DIAGNOSIS — Z66 Do not resuscitate: Secondary | ICD-10-CM | POA: Diagnosis present

## 2022-02-25 DIAGNOSIS — Z7982 Long term (current) use of aspirin: Secondary | ICD-10-CM

## 2022-02-25 DIAGNOSIS — J690 Pneumonitis due to inhalation of food and vomit: Secondary | ICD-10-CM | POA: Diagnosis not present

## 2022-02-25 DIAGNOSIS — E782 Mixed hyperlipidemia: Secondary | ICD-10-CM

## 2022-02-25 DIAGNOSIS — R0689 Other abnormalities of breathing: Secondary | ICD-10-CM | POA: Diagnosis not present

## 2022-02-25 DIAGNOSIS — R000 Tachycardia, unspecified: Secondary | ICD-10-CM | POA: Diagnosis not present

## 2022-02-25 LAB — RAPID URINE DRUG SCREEN, HOSP PERFORMED
Amphetamines: NOT DETECTED
Barbiturates: NOT DETECTED
Benzodiazepines: NOT DETECTED
Cocaine: NOT DETECTED
Opiates: NOT DETECTED
Tetrahydrocannabinol: NOT DETECTED

## 2022-02-25 LAB — URINALYSIS, ROUTINE W REFLEX MICROSCOPIC
Bacteria, UA: NONE SEEN
Bilirubin Urine: NEGATIVE
Glucose, UA: NEGATIVE mg/dL
Ketones, ur: NEGATIVE mg/dL
Leukocytes,Ua: NEGATIVE
Nitrite: NEGATIVE
Protein, ur: 30 mg/dL — AB
Specific Gravity, Urine: 1.019 (ref 1.005–1.030)
pH: 5 (ref 5.0–8.0)

## 2022-02-25 LAB — ECHOCARDIOGRAM COMPLETE
AR max vel: 4.42 cm2
AV Area VTI: 4.25 cm2
AV Area mean vel: 4.87 cm2
AV Mean grad: 1 mmHg
AV Peak grad: 2.8 mmHg
Ao pk vel: 0.84 m/s
Area-P 1/2: 4.58 cm2
Height: 73 in
MV VTI: 3.77 cm2
S' Lateral: 2.65 cm

## 2022-02-25 LAB — DIFFERENTIAL
Abs Immature Granulocytes: 0.09 10*3/uL — ABNORMAL HIGH (ref 0.00–0.07)
Basophils Absolute: 0.1 10*3/uL (ref 0.0–0.1)
Basophils Relative: 1 %
Eosinophils Absolute: 0.1 10*3/uL (ref 0.0–0.5)
Eosinophils Relative: 1 %
Immature Granulocytes: 1 %
Lymphocytes Relative: 20 %
Lymphs Abs: 2.2 10*3/uL (ref 0.7–4.0)
Monocytes Absolute: 1 10*3/uL (ref 0.1–1.0)
Monocytes Relative: 9 %
Neutro Abs: 7.7 10*3/uL (ref 1.7–7.7)
Neutrophils Relative %: 68 %

## 2022-02-25 LAB — LIPID PANEL
Cholesterol: 205 mg/dL — ABNORMAL HIGH (ref 0–200)
HDL: 33 mg/dL — ABNORMAL LOW (ref >40)
LDL Cholesterol: 144 mg/dL — ABNORMAL HIGH (ref 0–99)
Total CHOL/HDL Ratio: 6.2 RATIO
Triglycerides: 142 mg/dL (ref <150)
VLDL: 28 mg/dL (ref 0–40)

## 2022-02-25 LAB — CBC
HCT: 49.1 % (ref 39.0–52.0)
Hemoglobin: 16.2 g/dL (ref 13.0–17.0)
MCH: 29.5 pg (ref 26.0–34.0)
MCHC: 33 g/dL (ref 30.0–36.0)
MCV: 89.4 fL (ref 80.0–100.0)
Platelets: 300 10*3/uL (ref 150–400)
RBC: 5.49 MIL/uL (ref 4.22–5.81)
RDW: 13.2 % (ref 11.5–15.5)
WBC: 11.2 10*3/uL — ABNORMAL HIGH (ref 4.0–10.5)
nRBC: 0 % (ref 0.0–0.2)

## 2022-02-25 LAB — ETHANOL: Alcohol, Ethyl (B): <10 mg/dL (ref <10)

## 2022-02-25 LAB — COMPREHENSIVE METABOLIC PANEL
ALT: 12 U/L (ref 0–44)
AST: 16 U/L (ref 15–41)
Albumin: 3.8 g/dL (ref 3.5–5.0)
Alkaline Phosphatase: 89 U/L (ref 38–126)
Anion gap: 7 (ref 5–15)
BUN: 13 mg/dL (ref 8–23)
CO2: 20 mmol/L — ABNORMAL LOW (ref 22–32)
Calcium: 8.7 mg/dL — ABNORMAL LOW (ref 8.9–10.3)
Chloride: 107 mmol/L (ref 98–111)
Creatinine, Ser: 1.16 mg/dL (ref 0.61–1.24)
GFR, Estimated: >60 mL/min (ref >60)
Glucose, Bld: 122 mg/dL — ABNORMAL HIGH (ref 70–99)
Potassium: 3.8 mmol/L (ref 3.5–5.1)
Sodium: 134 mmol/L — ABNORMAL LOW (ref 135–145)
Total Bilirubin: 0.6 mg/dL (ref 0.3–1.2)
Total Protein: 7.7 g/dL (ref 6.5–8.1)

## 2022-02-25 LAB — PROTIME-INR
INR: 1.1 (ref 0.8–1.2)
Prothrombin Time: 13.6 seconds (ref 11.4–15.2)

## 2022-02-25 LAB — TSH: TSH: 5.619 u[IU]/mL — ABNORMAL HIGH (ref 0.350–4.500)

## 2022-02-25 LAB — CBG MONITORING, ED: Glucose-Capillary: 139 mg/dL — ABNORMAL HIGH (ref 70–99)

## 2022-02-25 LAB — ACETAMINOPHEN LEVEL: Acetaminophen (Tylenol), Serum: <10 ug/mL — ABNORMAL LOW (ref 10–30)

## 2022-02-25 LAB — APTT: aPTT: 28 seconds (ref 24–36)

## 2022-02-25 MED ORDER — SENNOSIDES-DOCUSATE SODIUM 8.6-50 MG PO TABS: 1.0000 | ORAL_TABLET | Freq: Every evening | ORAL | Status: DC | PRN

## 2022-02-25 MED ORDER — ASPIRIN 81 MG PO TBEC: 81.0000 mg | DELAYED_RELEASE_TABLET | Freq: Every day | ORAL | Status: DC

## 2022-02-25 MED ORDER — HEPARIN SODIUM (PORCINE) 5000 UNIT/ML IJ SOLN
5000.0000 [IU] | Freq: Three times a day (TID) | INTRAMUSCULAR | Status: DC
  Administered 2022-02-25 – 2022-02-26 (×5): 5000 [IU] via SUBCUTANEOUS
  Filled 2022-02-25 (×5): qty 1

## 2022-02-25 MED ORDER — LABETALOL HCL 5 MG/ML IV SOLN
5.0000 mg | INTRAVENOUS | Status: DC | PRN
  Administered 2022-02-25: 5 mg via INTRAVENOUS
  Filled 2022-02-25: qty 4

## 2022-02-25 MED ORDER — CLOPIDOGREL BISULFATE 75 MG PO TABS
75.0000 mg | ORAL_TABLET | Freq: Every day | ORAL | Status: DC
  Filled 2022-02-25: qty 1

## 2022-02-25 MED ORDER — ACETAMINOPHEN 325 MG PO TABS
650.0000 mg | ORAL_TABLET | ORAL | Status: DC | PRN
  Administered 2022-02-27: 650 mg via ORAL
  Filled 2022-02-25: qty 2

## 2022-02-25 MED ORDER — ATORVASTATIN CALCIUM 40 MG PO TABS
40.0000 mg | ORAL_TABLET | Freq: Every day | ORAL | Status: DC
  Filled 2022-02-25: qty 1

## 2022-02-25 MED ORDER — ASPIRIN 325 MG PO TABS
325.0000 mg | ORAL_TABLET | Freq: Once | ORAL | Status: DC
  Filled 2022-02-25: qty 1

## 2022-02-25 MED ORDER — ACETAMINOPHEN 160 MG/5ML PO SOLN: 650.0000 mg | ORAL | Status: DC | PRN

## 2022-02-25 MED ORDER — STROKE: EARLY STAGES OF RECOVERY BOOK
Freq: Once | Status: AC
  Filled 2022-02-25: qty 1

## 2022-02-25 MED ORDER — CLOPIDOGREL BISULFATE 75 MG PO TABS
75.0000 mg | ORAL_TABLET | Freq: Every day | ORAL | Status: DC
  Administered 2022-02-25 – 2022-03-02 (×6): 75 mg via ORAL
  Filled 2022-02-25 (×6): qty 1

## 2022-02-25 MED ORDER — ACETAMINOPHEN 650 MG RE SUPP: 650.0000 mg | RECTAL | Status: DC | PRN

## 2022-02-25 MED ORDER — ASPIRIN 81 MG PO TBEC
81.0000 mg | DELAYED_RELEASE_TABLET | Freq: Every day | ORAL | Status: DC
  Administered 2022-02-25 – 2022-03-02 (×6): 81 mg via ORAL
  Filled 2022-02-25 (×6): qty 1

## 2022-02-25 NOTE — ED Triage Notes (Signed)
[facility]ms from home. Cc of altered mental status/possible stroke. Per ems wife reports patient woke up and was at his baseline at midnight. But at 0230 patient became more altered than usual. Pt is bedridden at baseline with dementia and a history of prior CVA. Patient able to move all extremities but will not follow commands.

## 2022-02-25 NOTE — Consult Note (Signed)
Tele[facility] Consult Services   Patient Name:   Walter [name], Walter [name] Date of Birth:   July 04, 1950 Identification Number:   MRN - [id number] Date of Service:   02/25/2022 03:13:11  Diagnosis:       R41.82 - AMS (Altered Mental Status)  Impression:      Walter [name] is a 71 year-old man with a PMH of HTN, HLD, Depression, COPD, Stroke, Vascular Dementia, Bedridden at baseline who was BIBEMS for altered mental status. TNK was not given as it not clear how different his current exam is from his baseline and symptoms appear to be generalized.  Our recommendations are outlined below.  Recommendations:        Stroke/Telemetry Floor       Neuro Checks       Bedside Swallow Eval       DVT Prophylaxis       IV Fluids, Normal Saline       Head of Bed 30 Degrees       Euglycemia and Avoid Hyperthermia (PRN Acetaminophen)       Aspirin per rectum       Antihypertensives PRN if Blood pressure is greater than 220/120 or there is a concern for End organ damage/contraindications for permissive HTN. If blood pressure is greater than 220/120 give labetalol PO or IV or Vasotec IV with a goal of 15% reduction in BP during the first 24 hours.       - Toxic/Metabolic Workup as per primary team       - MRI Brain without contrast       - TTE       - Carotid Dopplers       - Labs: lipid panel; HgbA1c       - PT/OT/ST where applicable  Sign Out:       Discussed with Emergency Department Provider    ------------------------------------------------------------------------------  Advanced Imaging: Advanced Imaging Deferred because:  Poor functional status at baseline, a greater risk than benefit with acute intervention   Metrics: Last Known Well: 02/25/2022 00:00:00 [facility] Notification Time: 02/25/2022 03:13:11 Arrival Time: 02/25/2022 03:02:00 Stamp Time: 02/25/2022 03:13:11 Initial Response Time: 02/25/2022 03:16:07 Symptoms: Altered mental status. Initial patient interaction: 02/25/2022  03:18:22 NIHSS Assessment Completed: 02/25/2022 03:19:00 Patient is not a candidate for Thrombolytic. Thrombolytic Medical Decision: 02/25/2022 03:20:59 Patient was not deemed candidate for Thrombolytic because of following reasons: No new focal deficits.  CT head showed no acute hemorrhage or acute core infarct. I personally Reviewed the CT Head and it Showed no ICH  Primary Provider Notified of Diagnostic Impression and Management Plan on: 02/25/2022 03:43:26    ------------------------------------------------------------------------------  History of Present Illness: Patient is a 71 year old Male.  Patient was brought by EMS for symptoms of Altered mental status. Walter [name] is a 71 year-old man with a PMH of HTN, HLD, Depression, COPD, Stroke, Vascular Dementia, Bedridden at baseline who was BIBEMS for altered mental status. Wife is not at bedside. As per ER staff, while called EMS because she reported that Walter [name] was confused and not cooperating with her at baseline.   Past Medical History:      Hypertension      Hyperlipidemia      Stroke  Medications:  Anticoagulant use:  Unknown Antiplatelet use: Unknown Reviewed EMR for current medications  Allergies:  Reviewed,NKDA  Social History: Smoking: Former  Family History:  There is no family history of premature cerebrovascular disease pertinent to this consultation  ROS : 14 Points Review of  Systems was performed and was negative except mentioned in HPI.  Past Surgical History: There Is No Surgical History Contributory To Today's Visit    Examination: BP(207/106), Pulse(113), 1A: Level of Consciousness - Alert; keenly responsive + 0 1B: Ask Month and Age - Aphasic + 2 1C: Blink Eyes & Squeeze Hands - Performs 0 Tasks + 2 2: Test Horizontal Extraocular Movements - Normal + 0 3: Test Visual Fields - No Visual Loss + 0 4: Test Facial Palsy (Use Grimace if Obtunded) - Normal symmetry + 0 5A: Test Left Arm  Motor Drift - Drift, hits bed + 2 5B: Test Right Arm Motor Drift - Drift, hits bed + 2 6A: Test Left Leg Motor Drift - No Effort Against Gravity + 3 6B: Test Right Leg Motor Drift - No Effort Against Gravity + 3 7: Test Limb Ataxia (FNF/Heel-Shin) - No Ataxia + 0 8: Test Sensation - Normal; No sensory loss + 0 9: Test Language/Aphasia - Normal; No aphasia + 0 10: Test Dysarthria - Normal + 0 11: Test Extinction/Inattention - No abnormality + 0  NIHSS Score: 14   Pre-Morbid Modified Rankin Scale: 5 Points = Severe disability; bedridden, incontinent and requiring constant nursing care and attention  Spoke with : ER Physician (Dr. Christy Gentles)  Patient/Family was informed the Neurology Consult would occur via TeleHealth consult by way of interactive audio and video telecommunications and consented to receiving care in this manner.   Patient is being evaluated for possible acute neurologic impairment and high probability of imminent or life-threatening deterioration. I spent total of 36 minutes providing care to this patient, including time for face to face visit via telemedicine, review of medical records, imaging studies and discussion of findings with providers, the patient and/or family.   Dr Asa Saunas   [facility] For Inpatient follow-up with [facility] physician please call [facility] [phone]. This is not an outpatient service. Post hospital discharge, please contact hospital directly.

## 2022-02-25 NOTE — Progress Notes (Signed)
*  PRELIMINARY RESULTS* Echocardiogram 2D Echocardiogram has been performed.  Walter [name] 02/25/2022, 10:59 AM

## 2022-02-25 NOTE — ED Notes (Signed)
Echo at bedside

## 2022-02-25 NOTE — Assessment & Plan Note (Addendum)
-   Pt has intermittent confusion; monitor closely  -- lipids are uncontrolled, continue atorvastatin 40 mg daily (suspect wasn't taking it at home) -- follow up dementia lab panel (studies WNL)

## 2022-02-25 NOTE — H&P (Signed)
History and Physical    Patient: Walter [name]:[id number] DOB: 09-Apr-1950 DOA: 02/25/2022 DOS: the patient was seen and examined on 02/25/2022 PCP: Dettinger, Fransisca Kaufmann, MD  Patient coming from: Home  Chief Complaint:  Chief Complaint  Patient presents with   Altered Mental Status   HPI: Walter [name] is a 71 y.o. male with medical history significant of TIA, CVA, C. difficile colitis, COPD, coronary artery disease, hypertension, hyperlipidemia, and more presents to the ED with a chief complaint of altered mental status.  Patient is not able to provide any history.  Wife had reported to the ED provider that patient went to bed around midnight and was at baseline at that time.  A couple of hours later he was difficult to arouse, and was not speaking.  She called EMS for altered mental status.  In the ER at first he was aphasic.  He was also hypersomnolent.  Throughout his stay in the ER he has become more alert, the only worried he will say is "alright."  He is following commands and can move all 4 extremities.  Code stroke was done in the ER and he was determined not to be a candidate for acute intervention.  They recommended admission for stroke and metabolic work-up.  It is documented in previous charts that patient is DNR. Review of Systems: unable to review all systems due to the inability of the patient to answer questions. Past Medical History:  Diagnosis Date   Acute urinary retention 02/12/2014   Brain TIA    recurrent    C. difficile enteritis    Carotid bruit    COPD (chronic obstructive pulmonary disease) (HCC)    Coronary artery disease    Myocardial infarction ([place])    incidental   Nodule of kidney 02/14/14   solid on left   Recurrent colitis due to Clostridium difficile 02/11/2014   Stroke ([place]) 1997   Past Surgical History:  Procedure Laterality Date   BACK SURGERY     HIP ARTHROPLASTY Left 09/09/2019   Procedure: ARTHROPLASTY BIPOLAR HIP (HEMIARTHROPLASTY);   Surgeon: Paralee Cancel, MD;  Location: [facility];  Service: Orthopedics;  Laterality: Left;   KNEE SURGERY     Social History:  reports that he has quit smoking. His smoking use included cigarettes. He has a 55.00 pack-year smoking history. He has never used smokeless tobacco. He reports that he does not drink alcohol and does not use drugs.  No Known Allergies  Family History  Problem Relation Age of Onset   Cancer Father 74       stomach   Coronary artery disease Mother 26   Stroke Mother    Coronary artery disease Brother 62       AMI deceased   Heart attack Daughter     Prior to Admission medications   Medication Sig Start Date End Date Taking? Authorizing Provider  acetaminophen (TYLENOL) 325 MG tablet Take 1-2 tablets (325-650 mg total) by mouth every 6 (six) hours as needed for mild pain (pain score 1-3). 09/11/19   Domenic Polite, MD  amLODipine (NORVASC) 5 MG tablet Take 1 tablet (5 mg total) by mouth daily. 05/17/21   Dettinger, Fransisca Kaufmann, MD  aspirin EC 81 MG tablet Take 81 mg by mouth daily. Swallow whole.    [provider]  atorvastatin (LIPITOR) 40 MG tablet Take 1 tablet (40 mg total) by mouth daily. 11/15/21   Dettinger, Fransisca Kaufmann, MD  clopidogrel (PLAVIX) 75 MG tablet TAKE 1  TABLET EVERY DAY 02/07/22   Dettinger, Fransisca Kaufmann, MD  Multiple Vitamins-Minerals (ONE-A-DAY 50 PLUS PO) Take by mouth daily.    [provider]  tamsulosin (FLOMAX) 0.4 MG CAPS capsule Take 1 capsule (0.4 mg total) by mouth daily after breakfast. 09/15/19   Domenic Polite, MD    Physical Exam: Vitals:   02/25/22 0330 02/25/22 0333 02/25/22 0400 02/25/22 0445  BP: (!) 195/105  (!) 190/113   Pulse: (!) 112  (!) 113 95  Resp: '15  15 17  '$ Temp:      TempSrc:      SpO2: 97%  98% 95%  Height:  '6\' 1"'$  (1.854 m)     1.  General: Patient lying supine in bed,  no acute distress   2. Psychiatric: Alert, partially cooperative with exam   3. Neurologic: Only saying 1 word, face is  symmetric, moves all 4 extremities voluntarily, altered from baseline  4. HEENMT:  Head is atraumatic, normocephalic, pupils reactive to light, neck is supple, trachea is midline, mucous membranes are moist   5. Respiratory : Lungs are clear to auscultation bilaterally without wheezing, rhonchi, rales, no cyanosis, no increase in work of breathing or accessory muscle use   6. Cardiovascular : Heart rate normal, rhythm is regular, no murmurs, rubs or gallops, no peripheral edema, peripheral pulses palpated   7. Gastrointestinal:  Abdomen is soft, nondistended, nontender to palpation bowel sounds active, no masses or organomegaly palpated   8. Skin:  Skin is warm, dry and intact without rashes, acute lesions, or ulcers on limited exam   9.Musculoskeletal:  No acute deformities or trauma, no asymmetry in tone, no peripheral edema, peripheral pulses palpated, no tenderness to palpation in the extremities  Data Reviewed: In the ED 98.3, heart rate 95-113, blood pressure 190/105-207/113, satting 95% UA is not indicative of UTI Slight leukocytosis at 11.2, hemoglobin 16.2 Chemistry shows decreased bicarb at 20, slight increase in creatinine from 0.95-1.16 UDS negative Alcohol undetectable CT code stroke negative for acute finding Neuro recommending admission for stroke work-up  Assessment and Plan: * CVA (cerebral vascular accident) ([place]) - As above patient was difficult to arouse in the early morning hours, and and was less talkative than normal - Patient's baseline is described as talkative but confused secondary to vascular dementia - He does have a documented history of speech apraxia - His initial NIH score was 14 - He is trending back towards baseline at this time, alert, following commands, but still not conversing - CT head showed no acute intracranial process - MRI in the a.m. - Carotid Doppler in the a.m. - Echo in the a.m. - Continue home dose of aspirin and Plavix -  Continue statin - Check lipid panel - Continue to monitor  Leukocytosis - UA is not indicative of UTI - No respiratory symptoms observed, patient maintaining oxygen sats on room air No other overt signs of infection - Continue monitor - Trend in the a.m.  Coronary artery disease - Continue aspirin, Plavix, statin - Monitor on telemetry  Acute metabolic encephalopathy - Last known well midnight 11/24 - Seizure was difficult to arouse, and seems less talkative than normal - Patient does have a history of speech apraxia - Code stroke was done patient was not a candidate for acute intervention - Advised stroke work-up and metabolic work-up - UDS negative, alcohol level undetectable - Check nutritional deficiencies - Check thyroid - MRI in the a.m. - Continue to monitor  Vascular dementia ([place]) - Baseline  is talkative but confused per ED provider report  Hyperlipidemia, mixed - Continue statin  HTN (hypertension) - Permissive hypertension - Holding Norvasc - Treat for blood pressure greater than 220/110      Advance Care Planning:   Code Status: DNR   Consults: Neuro  Family Communication: None  Severity of Illness: The appropriate patient status for this patient is OBSERVATION. Observation status is judged to be reasonable and necessary in order to provide the required intensity of service to ensure the patient's safety. The patient's presenting symptoms, physical exam findings, and initial radiographic and laboratory data in the context of their medical condition is felt to place them at decreased risk for further clinical deterioration. Furthermore, it is anticipated that the patient will be medically stable for discharge from the hospital within 2 midnights of admission.   Author: Rolla Plate, DO 02/25/2022 6:09 AM  For on call review [email or web address].

## 2022-02-25 NOTE — Assessment & Plan Note (Addendum)
-   UA is not indicative of UTI - No respiratory symptoms observed, patient maintaining oxygen sats on room air - He appears to have aspirated on 11/26, he now has evidence of pneumonia on CXR and we are starting antibiotics

## 2022-02-25 NOTE — Evaluation (Signed)
Occupational Therapy Evaluation Patient Details Name: Walter [name] MRN: [id number] DOB: 09/17/1950 Today's Date: 02/25/2022   History of Present Illness 71 y.o. M admitted on 02/25/22 due to AMS. Pt undergoing stroke and metabolic work up. PMH significant for TIA, CVA, C. difficile colitis, COPD, coronary artery disease, hypertension, hyperlipidemia.   Clinical Impression   Pt admitted for concerns listed above. PTA Pt 's wife reported that he was fairly independent with functional mobility, however he has had several falls. She stated as well that she assisted intermittently with all ADL's. At this time, pt presents with increased confusion, following 60-70% of 1 step commands, and increased weakness. Overall he is requiring mod-max A for ADL's and bed mobility. Unable to assess transfers this session due to pt's cognition and for safety requiring +2 assist. Recommending SNF level therapies at this time and OT will continue to follow acutely.       Recommendations for follow up therapy are one component of a multi-disciplinary discharge planning process, led by the attending physician.  Recommendations may be updated based on patient status, additional functional criteria and insurance authorization.   Follow Up Recommendations  Skilled nursing-short term rehab (<3 hours/day)     Assistance Recommended at Discharge Frequent or constant Supervision/Assistance  Patient can return home with the following Two people to help with walking and/or transfers;A lot of help with bathing/dressing/bathroom;Assistance with cooking/housework;Direct supervision/assist for medications management;Direct supervision/assist for financial management;Help with stairs or ramp for entrance;Assist for transportation    Functional Status Assessment  Patient has had a recent decline in their functional status and/or demonstrates limited ability to make significant improvements in function in a reasonable and  predictable amount of time  Equipment Recommendations  None recommended by OT    Recommendations for Other Services       Precautions / Restrictions Precautions Precautions: Fall Restrictions Weight Bearing Restrictions: No      Mobility Bed Mobility Overal bed mobility: Needs Assistance Bed Mobility: Supine to Sit, Sit to Supine     Supine to sit: Mod assist Sit to supine: Mod assist   General bed mobility comments: Mod A to pull to sitting EOB, then Mod A to bring BLE into bed    Transfers                   General transfer comment: deferred due to difficulty following commands and requiring +2 assist      Balance Overall balance assessment: Needs assistance Sitting-balance support: Bilateral upper extremity supported, Feet unsupported Sitting balance-Leahy Scale: Fair Sitting balance - Comments: Requiring UE support to remain upright Postural control: Posterior lean                                 ADL either performed or assessed with clinical judgement   ADL Overall ADL's : Needs assistance/impaired Eating/Feeding: NPO   Grooming: Set up;Sitting   Upper Body Bathing: Moderate assistance;Sitting   Lower Body Bathing: Maximal assistance;Sitting/lateral leans   Upper Body Dressing : Moderate assistance;Sitting   Lower Body Dressing: Maximal assistance;Bed level       Toileting- Clothing Manipulation and Hygiene: Maximal assistance;Bed level         General ADL Comments: Assessed at bed level due to cognition and requiring increased assist (+2).  Pt is weaker than baseline and requiring increased assist for all tasks assessed     Vision Baseline Vision/History: 0 No visual deficits Ability  to See in Adequate Light: 0 Adequate Patient Visual Report: No change from baseline Vision Assessment?: Vision impaired- to be further tested in functional context Additional Comments: Pt not following commands to adequately check vision.      Perception Perception Perception Tested?: No   Praxis Praxis Praxis tested?: Not tested    Pertinent Vitals/Pain Pain Assessment Pain Assessment: PAINAD Breathing: normal Negative Vocalization: none Facial Expression: sad, frightened, frown Body Language: tense, distressed pacing, fidgeting Consolability: no need to console PAINAD Score: 2 Pain Intervention(s): Monitored during session, Repositioned     Hand Dominance Right   Extremity/Trunk Assessment Upper Extremity Assessment Upper Extremity Assessment: Generalized weakness;Difficult to assess due to impaired cognition   Lower Extremity Assessment Lower Extremity Assessment: Defer to PT evaluation   Cervical / Trunk Assessment Cervical / Trunk Assessment: Kyphotic   Communication Communication Communication: Receptive difficulties;Expressive difficulties;HOH   Cognition Arousal/Alertness: Awake/alert Behavior During Therapy: Anxious Overall Cognitive Status: Difficult to assess                                 General Comments: Pt following most simple commands with increased time and cuing     General Comments  VSS on RA, wife and sister present    Exercises     Shoulder Instructions      Home Living Family/patient expects to be discharged to:: Private residence Living Arrangements: Spouse/significant other Available Help at Discharge: Family;Available 24 hours/day Type of Home: House Home Access: Stairs to enter [employer] of Steps: 1 step\ Entrance Stairs-Rails: None Home Layout: One level     Bathroom Shower/Tub: Teacher, early years/pre: Standard Bathroom Accessibility: Yes How Accessible: Accessible via walker Home Equipment: Rolling Walker (2 wheels);Cane - single point;Shower seat;Toilet riser          Prior Functioning/Environment Prior Level of Function : Needs assist             Mobility Comments: uses RW at times ADLs Comments: per wife,  sometimes needs assist with ADL's        OT Problem List: Decreased strength;Decreased activity tolerance;Impaired balance (sitting and/or standing);Decreased coordination;Decreased cognition;Decreased safety awareness;Impaired UE functional use      OT Treatment/Interventions: Self-care/ADL training;Therapeutic exercise;Neuromuscular education;Energy conservation;DME and/or AE instruction;Therapeutic activities;Patient/family education;Balance training;Cognitive remediation/compensation    OT Goals(Current goals can be found in the care plan section) Acute Rehab OT Goals Patient Stated Goal: To go home OT Goal Formulation: With patient/family Time For Goal Achievement: 03/11/22 Potential to Achieve Goals: Good ADL Goals Pt Will Perform Grooming: with modified independence;sitting Pt Will Perform Lower Body Bathing: with min assist;sitting/lateral leans;sit to/from stand Pt Will Perform Lower Body Dressing: with min assist;sitting/lateral leans;sit to/from stand Pt Will Transfer to Toilet: with min assist;ambulating Pt Will Perform Toileting - Clothing Manipulation and hygiene: with min assist;sitting/lateral leans;sit to/from stand  OT Frequency: Min 1X/week    Co-evaluation              AM-PAC OT "6 Clicks" Daily Activity     Outcome Measure Help from another person eating meals?: Total Help from another person taking care of personal grooming?: A Little Help from another person toileting, which includes using toliet, bedpan, or urinal?: A Lot Help from another person bathing (including washing, rinsing, drying)?: A Lot Help from another person to put on and taking off regular upper body clothing?: A Lot Help from another person to put on and taking off  regular lower body clothing?: A Lot 6 Click Score: 12   End of Session Nurse Communication: Mobility status  Activity Tolerance: Patient tolerated treatment well Patient left: in bed;with call bell/phone within reach;Other  (comment) (leaving OTF)  OT Visit Diagnosis: Unsteadiness on feet (R26.81);Other abnormalities of gait and mobility (R26.89);Muscle weakness (generalized) (M62.81)                Time: 4035-2481 OT Time Calculation (min): 28 min Charges:  OT General Charges $OT Visit: 1 Visit OT Evaluation $OT Eval Moderate Complexity: 1 Mod OT Treatments $Self Care/Home Management : 8-22 mins  Liticia Gasior Yolanda Bonine, OTR/L [place] 02/25/2022, 3:19 PM

## 2022-02-25 NOTE — Progress Notes (Signed)
ASSUMPTION OF CARE NOTE   02/25/2022 4:26 PM  Walter [name] was seen and examined.  The H&P by the admitting provider, orders, imaging was reviewed.  Please see new orders.  Will continue to follow.   Vitals:   02/25/22 1430 02/25/22 1454  BP: 133/81   Pulse: 84   Resp: 16   Temp:  98.5 F (36.9 C)  SpO2: 94%     Results for orders placed or performed during the hospital encounter of 02/25/22  Ethanol  Result Value Ref Range   Alcohol, Ethyl (B) <10 <10 mg/dL  Protime-INR  Result Value Ref Range   Prothrombin Time 13.6 11.4 - 15.2 seconds   INR 1.1 0.8 - 1.2  APTT  Result Value Ref Range   aPTT 28 24 - 36 seconds  CBC  Result Value Ref Range   WBC 11.2 (H) 4.0 - 10.5 K/uL   RBC 5.49 4.22 - 5.81 MIL/uL   Hemoglobin 16.2 13.0 - 17.0 g/dL   HCT 49.1 39.0 - 52.0 %   MCV 89.4 80.0 - 100.0 fL   MCH 29.5 26.0 - 34.0 pg   MCHC 33.0 30.0 - 36.0 g/dL   RDW 13.2 11.5 - 15.5 %   Platelets 300 150 - 400 K/uL   nRBC 0.0 0.0 - 0.2 %  Differential  Result Value Ref Range   Neutrophils Relative % 68 %   Neutro Abs 7.7 1.7 - 7.7 K/uL   Lymphocytes Relative 20 %   Lymphs Abs 2.2 0.7 - 4.0 K/uL   Monocytes Relative 9 %   Monocytes Absolute 1.0 0.1 - 1.0 K/uL   Eosinophils Relative 1 %   Eosinophils Absolute 0.1 0.0 - 0.5 K/uL   Basophils Relative 1 %   Basophils Absolute 0.1 0.0 - 0.1 K/uL   Immature Granulocytes 1 %   Abs Immature Granulocytes 0.09 (H) 0.00 - 0.07 K/uL  Comprehensive metabolic panel  Result Value Ref Range   Sodium 134 (L) 135 - 145 mmol/L   Potassium 3.8 3.5 - 5.1 mmol/L   Chloride 107 98 - 111 mmol/L   CO2 20 (L) 22 - 32 mmol/L   Glucose, Bld 122 (H) 70 - 99 mg/dL   BUN 13 8 - 23 mg/dL   Creatinine, Ser 1.16 0.61 - 1.24 mg/dL   Calcium 8.7 (L) 8.9 - 10.3 mg/dL   Total Protein 7.7 6.5 - 8.1 g/dL   Albumin 3.8 3.5 - 5.0 g/dL   AST 16 15 - 41 U/L   ALT 12 0 - 44 U/L   Alkaline Phosphatase 89 38 - 126 U/L   Total Bilirubin 0.6 0.3 - 1.2 mg/dL   GFR,  Estimated >60 >60 mL/min   Anion gap 7 5 - 15  Urine rapid drug screen (hosp performed)  Result Value Ref Range   Opiates NONE DETECTED NONE DETECTED   Cocaine NONE DETECTED NONE DETECTED   Benzodiazepines NONE DETECTED NONE DETECTED   Amphetamines NONE DETECTED NONE DETECTED   Tetrahydrocannabinol NONE DETECTED NONE DETECTED   Barbiturates NONE DETECTED NONE DETECTED  Urinalysis, Routine w reflex microscopic Urine, In & Out Cath  Result Value Ref Range   Color, Urine YELLOW YELLOW   APPearance CLEAR CLEAR   Specific Gravity, Urine 1.019 1.005 - 1.030   pH 5.0 5.0 - 8.0   Glucose, UA NEGATIVE NEGATIVE mg/dL   Hgb urine dipstick SMALL (A) NEGATIVE   Bilirubin Urine NEGATIVE NEGATIVE   Ketones, ur NEGATIVE NEGATIVE mg/dL   Protein,  ur 30 (A) NEGATIVE mg/dL   Nitrite NEGATIVE NEGATIVE   Leukocytes,Ua NEGATIVE NEGATIVE   RBC / HPF 6-10 0 - 5 RBC/hpf   WBC, UA 0-5 0 - 5 WBC/hpf   Bacteria, UA NONE SEEN NONE SEEN   Mucus PRESENT   Acetaminophen level  Result Value Ref Range   Acetaminophen (Tylenol), Serum <10 (L) 10 - 30 ug/mL  TSH  Result Value Ref Range   TSH 5.619 (H) 0.350 - 4.500 uIU/mL  Lipid panel  Result Value Ref Range   Cholesterol 205 (H) 0 - 200 mg/dL   Triglycerides 142 <150 mg/dL   HDL 33 (L) >40 mg/dL   Total CHOL/HDL Ratio 6.2 RATIO   VLDL 28 0 - 40 mg/dL   LDL Cholesterol 144 (H) 0 - 99 mg/dL  CBG monitoring, ED  Result Value Ref Range   Glucose-Capillary 139 (H) 70 - 99 mg/dL  ECHOCARDIOGRAM COMPLETE  Result Value Ref Range   Height 73 in   BP 124/84 mmHg   AR max vel 4.42 cm2   AV Area VTI 4.25 cm2   AV Mean grad 1.0 mmHg   AV Peak grad 2.8 mmHg   Ao pk vel 0.84 m/s   AV Area mean vel 4.87 cm2   MV VTI 3.77 cm2   Area-P 1/2 4.58 cm2   S' Lateral 2.65 cm    Murvin Natal, MD [facility]   02/25/2022  3:04 AM How to contact the [facility] Attending or Consulting provider 7A - 7P or covering provider during after hours 7P -7A, for this patient?   Check the care team in [facility] and look for a) attending/consulting TRH provider listed and b) the [facility] team listed Log into [email or web address] and use [place]'s universal password to access. If you do not have the password, please contact the hospital operator. Locate the [facility] provider you are looking for under [facility] and page to a number that you can be directly reached. If you still have difficulty reaching the provider, please page the [facility] (Director on Call) for the Hospitalists listed on amion for assistance.

## 2022-02-25 NOTE — TOC Initial Note (Signed)
Transition of Care [facility]) - Initial/Assessment Note    Patient Details  Name: Walter [name] MRN: [id number] Date of Birth: 31-Jan-1951  Transition of [facility]) CM/SW Contact:    Salome Arnt, [place] Phone Number: 02/25/2022, 4:01 PM  Clinical Narrative:  Pt admitted due to CVA. Assessment completed with pt's wife as pt oriented to self only per chart. Pt's wife reports they are raising their 53 year old great granddaughter. Pt typically requires assist with bathing and dressing. He ambulates with a walker at baseline. Pt's wife states pt has had several falls at home. He is not active with any home health services. Discussed OT recommendation for SNF. Pt's wife is aware that he will need PT to recommend SNF as well for insurance to cover. She indicates she would be interested in SNF if recommended. Anticipate PT will evaluate tomorrow. If pt returns home, will need 3N1. TOC will follow up tomorrow.                    Barriers to Discharge: Continued Medical Work up   Patient Goals and CMS Choice Patient states their goals for this hospitalization and ongoing recovery are:: pending PT eval   Choice offered to / list presented to : Spouse  Expected Discharge Plan and Services   In-house Referral: Clinical Social Work     Living arrangements for the past 2 months: Single Family Home                                      Prior Living Arrangements/Services Living arrangements for the past 2 months: Single Family Home Lives with:: Spouse Patient language and need for interpreter reviewed:: Yes Do you feel safe going back to the place where you live?: Yes      Need for Family Participation in Patient Care: Yes (Comment) Care giver support system in place?: Yes (comment) Current home services: DME (cane, walker, 3N1) Criminal Activity/Legal Involvement Pertinent to Current Situation/Hospitalization: No - Comment as needed  Activities of Daily Living       Permission Sought/Granted                  Emotional Assessment   Attitude/Demeanor/Rapport: Unable to Assess   Orientation: : Oriented to Self Alcohol / Substance Use: Not Applicable Psych Involvement: No (comment)  Admission diagnosis:  TIA (transient ischemic attack) [G45.9] Delirium [R41.0] CVA (cerebral vascular accident) [facility]) [I63.9] Patient Active Problem List   Diagnosis Date Noted   CVA (cerebral vascular accident) ([place]) 69/67/8938   Acute metabolic encephalopathy 01/18/5101   Coronary artery disease 02/25/2022   Leukocytosis 02/25/2022   Speech apraxia 12/04/2020   Vascular dementia ([place]) 12/04/2020   S/P left THA, AA 09/09/2019   DNR (do not resuscitate) 09/08/2019   Depression, recurrent ([place]) 04/06/2018   Hyperlipidemia, mixed 04/06/2018   HTN (hypertension) 01/12/2015   BPH (benign prostatic hyperplasia) 01/12/2015   Nodule of kidney 02/14/2014   Weakness 02/11/2014   Recurrent colitis due to Clostridium difficile 02/11/2014   Occlusion and stenosis of carotid artery without mention of cerebral infarction 09/07/2012   History of CVA (cerebrovascular accident) 02/18/2012   Tobacco abuse 02/18/2012   Non-compliance 02/18/2012   COPD (chronic obstructive pulmonary disease) ([place]) 02/18/2012   PCP:  Dettinger, Fransisca Kaufmann, MD Pharmacy:   [id number] [id number]  Phone: [phone] Fax: [phone]  [place], [address] [place] [address] Phone: [phone] Fax: [phone]     Social Determinants of Health (SDOH) Interventions    Readmission Risk Interventions     No data to display

## 2022-02-25 NOTE — Assessment & Plan Note (Addendum)
-   he seems to be back to his baseline - started seroquel 25 mg QHS on 11/25; discussed with SNF they prefer he not be on antipsychotics and they plan to start him on depakote sprinkles.  DC seroquel.   - 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  -- follow up Dementia lab panel (no significant findings) - MRI unable to be completed as patient could not tolerate test - Plan to DC to [facility] 11/29 if stable

## 2022-02-25 NOTE — ED Notes (Signed)
Breakfast tray given. °

## 2022-02-25 NOTE — Assessment & Plan Note (Signed)
-   Continue aspirin, Plavix, statin - Monitor on telemetry

## 2022-02-25 NOTE — Assessment & Plan Note (Addendum)
-   Pt was unable to tolerate MRI and repeat CT was done 24 hours after first CT scan.  - no evidence of acute CVA found. - continue aspirin plavix that he was on prior to admission  --PT recommending SNF and after discussing with wife she would like to pursue SNF placement; --[facility] consulted for SNF placement

## 2022-02-25 NOTE — Assessment & Plan Note (Signed)
Continue statin. 

## 2022-02-25 NOTE — ED Provider Notes (Signed)
[facility] EMERGENCY DEPARTMENT Provider Note   CSN: [id number] Arrival date & time: 02/25/22  0302     History {Add pertinent medical, surgical, social history, OB history to   Chief Complaint - weakness  Level 5 caveat due to altered mental status  Walter [name] is a 71 y.o. male.  HPI Patient seen on arrival as a code stroke.  EMS reported patient has a history of vascular dementia and previous stroke.  He was at his usual state of health around midnight.  Wife told EMS the patient woke up appearing confused and concern for new stroke.  He is typically verbal at baseline.  No other details are known on arrival    Home Medications Prior to Admission medications   Medication Sig Start Date End Date Taking? Authorizing Provider  acetaminophen (TYLENOL) 325 MG tablet Take 1-2 tablets (325-650 mg total) by mouth every 6 (six) hours as needed for mild pain (pain score 1-3). 09/11/19   Domenic Polite, MD  amLODipine (NORVASC) 5 MG tablet Take 1 tablet (5 mg total) by mouth daily. 05/17/21   Dettinger, Fransisca Kaufmann, MD  aspirin EC 81 MG tablet Take 81 mg by mouth daily. Swallow whole.    [provider]  atorvastatin (LIPITOR) 40 MG tablet Take 1 tablet (40 mg total) by mouth daily. 11/15/21   Dettinger, Fransisca Kaufmann, MD  clopidogrel (PLAVIX) 75 MG tablet TAKE 1 TABLET EVERY DAY 02/07/22   Dettinger, Fransisca Kaufmann, MD  Multiple Vitamins-Minerals (ONE-A-DAY 50 PLUS PO) Take by mouth daily.    [provider]  tamsulosin (FLOMAX) 0.4 MG CAPS capsule Take 1 capsule (0.4 mg total) by mouth daily after breakfast. 09/15/19   Domenic Polite, MD      Allergies    Patient has no known allergies.    Review of Systems   Review of Systems  Unable to perform ROS: Mental status change    Physical Exam Updated Vital Signs BP (!) 190/113   Pulse 95   Temp 98.3 F (36.8 C) (Rectal)   Resp 17   Ht 1.854 m ('6\' 1"'$ )   SpO2 95%   BMI 25.20 kg/m  Rectal temp 98.2 Physical  Exam CONSTITUTIONAL: Elderly and disheveled HEAD: Normocephalic/atraumatic EYES: EOMI/PERRL ENMT: Mucous membranes dry NECK: supple no meningeal signs SPINE/BACK:entire spine nontender CV: S1/S2 noted, tachycardic LUNGS: Lungs are clear to auscultation bilaterally, no apparent distress ABDOMEN: soft, nontender NEURO: Pt is awake and alert.  Patient appears confused.  He is currently aphasic.  He does not follow commands EXTREMITIES: pulses normal/equal, full ROM SKIN: warm, color normal  ED Results / Procedures / Treatments   Labs (all labs ordered are listed, but only abnormal results are displayed) Labs Reviewed  CBC - Abnormal; Notable for the following components:      Result Value   WBC 11.2 (*)    All other components within normal limits  DIFFERENTIAL - Abnormal; Notable for the following components:   Abs Immature Granulocytes 0.09 (*)    All other components within normal limits  COMPREHENSIVE METABOLIC PANEL - Abnormal; Notable for the following components:   Sodium 134 (*)    CO2 20 (*)    Glucose, Bld 122 (*)    Calcium 8.7 (*)    All other components within normal limits  URINALYSIS, ROUTINE W REFLEX MICROSCOPIC - Abnormal; Notable for the following components:   Hgb urine dipstick SMALL (*)    Protein, ur 30 (*)    All other components within  normal limits  CBG MONITORING, ED - Abnormal; Notable for the following components:   Glucose-Capillary 139 (*)    All other components within normal limits  ETHANOL  PROTIME-INR  APTT  RAPID URINE DRUG SCREEN, HOSP PERFORMED  I-STAT CHEM 8, ED    EKG EKG Interpretation  Date/Time:  Friday February 25 2022 03:16:21 EST Ventricular Rate:  110 PR Interval:  203 QRS Duration: 90 QT Interval:  336 QTC Calculation: 455 R Axis:   -85 Text Interpretation: Sinus tachycardia Atrial premature complexes in couplets Left anterior fascicular block Consider anterior infarct Baseline wander in lead(s) V3 Confirmed by  Ripley Fraise [phone]) on 02/25/2022 3:19:56 AM  Radiology CT HEAD CODE STROKE WO CONTRAST  Result Date: 02/25/2022 CLINICAL DATA:  Code stroke.  Confusion and altered mental status EXAM: CT HEAD WITHOUT CONTRAST TECHNIQUE: Contiguous axial images were obtained from the base of the skull through the vertex without intravenous contrast. RADIATION DOSE REDUCTION: This exam was performed according to the departmental dose-optimization program which includes automated exposure control, adjustment of the mA and/or kV according to patient size and/or use of iterative reconstruction technique. COMPARISON:  01/27/2020 FINDINGS: Evaluation is somewhat limited by motion artifact. Brain: No evidence of acute infarction, hemorrhage, cerebral edema, mass, mass effect, or midline shift. No hydrocephalus or extra-axial collection. Redemonstrated remote infarcts in the bilateral basal ganglia. Periventricular white matter changes, likely the sequela of chronic small vessel ischemic disease. Vascular: No hyperdense vessel. Atherosclerotic calcifications in the intracranial carotid and vertebral arteries. Skull: Negative for fracture or focal lesion. Sinuses/Orbits: Clear paranasal sinuses. Status post bilateral lens replacements. Other: The mastoid air cells are well aerated. ASPECTS [facility] Stroke Program Early CT Score) - Ganglionic level infarction (caudate, lentiform nuclei, internal capsule, insula, M1-M3 cortex): 7 - Supraganglionic infarction (M4-M6 cortex): 3 Total score (0-10 with 10 being normal): 10 IMPRESSION: 1. No acute intracranial process. 2. ASPECTS is 10 Code stroke imaging results were communicated on 02/25/2022 at 3:20 am to provider [facility] via telephone, who verbally acknowledged these results. Electronically Signed   By: Merilyn Baba M.D.   On: 02/25/2022 03:21    Procedures .Critical Care  Performed by: Ripley Fraise, MD Authorized by: Ripley Fraise, MD   Critical care provider  statement:    Critical care time (minutes):  36   Critical care start time:  02/25/2022 3:24 AM   Critical care end time:  02/25/2022 4:00 AM   Critical care time was exclusive of:  Separately billable procedures and treating other patients   Critical care was necessary to treat or prevent imminent or life-threatening deterioration of the following conditions:  CNS failure or compromise   Critical care was time spent personally by me on the following activities:  Examination of patient, evaluation of patient's response to treatment, development of treatment plan with patient or surrogate, discussions with consultants, ordering and review of radiographic studies, ordering and review of laboratory studies, ordering and performing treatments and interventions, re-evaluation of patient's condition, review of old charts, obtaining history from patient or surrogate and pulse oximetry   I assumed direction of critical care for this patient from another provider in my specialty: no     Care discussed with: admitting provider       Medications Ordered in ED Medications - No data to display  ED Course/ Medical Decision Making/ A&P Clinical Course as of 02/25/22 0559  Fri Feb 25, 2022  0312 EMS reports pt was LKW at midnight.  He woke up with confusion  and aphasia.  Code stroke called. [DW]  0539 Teleneuro physician now examining patient [DW]  0320 CT head negative per radiology  [DW]  0327 D/w dr Lyndle Herrlich with teleneuro - he does not recommend an intervention at this time.  Continue AMS workup.   [DW]  [phone] Patient now more alert.  His blood pressure is improving spontaneously.  He is more talkative, but does appear confused [DW]  [phone] I spoke to his wife via the phone.  She reports he is typically confused when speaking and cannot ambulate.  She reports tonight he was "making noises "and was unable to be aroused which is new for him.  She was concerned he was having another stroke. [DW]  [phone] Since  patient is improving, he is not a candidate for any intervention.  However per neurology recommendations, he will need to be admitted. [DW]  4097 D/w dr zierle for admission.  BP is improving, will defer meds for now  [DW]    Clinical Course User Index [DW] Ripley Fraise, MD                           Medical Decision Making Amount and/or Complexity of Data Reviewed Labs: ordered. Radiology: ordered.  Risk Decision regarding hospitalization.   This patient presents to the ED for concern of stroke/weakness, this involves an extensive number of treatment options, and is a complaint that carries with it a high risk of complications and morbidity.  The differential diagnosis includes but is not limited to CVA, intracranial hemorrhage, acute coronary syndrome, renal failure, urinary tract infection, electrolyte disturbance, pneumonia    Comorbidities that complicate the patient evaluation: Patient's presentation is complicated by their history of dementia and previous stroke  Social Determinants of Health: Patient's  limited mobility   increases the complexity of managing their presentation  Additional history obtained: Additional history obtained from EMS  Records reviewed Primary Care Documents  Lab Tests: I Ordered, and personally interpreted labs.  The pertinent results include: Labs overall unremarkable  Imaging Studies ordered: I ordered imaging studies including CT scan head   I independently visualized and interpreted imaging which showed no acute findings I agree with the radiologist interpretation  Cardiac Monitoring: The patient was maintained on a cardiac monitor.  I personally viewed and interpreted the cardiac monitor which showed an underlying rhythm of:  sinus tachycardia   Critical Interventions:   admission for stroke evaluation  Consultations Obtained: I requested consultation with the admitting physician [facility] and consultant teleneurologist  , and discussed  findings as well as pertinent plan - they recommend: Admit  Reevaluation: After the interventions noted above, I reevaluated the patient and found that they have :improved  Complexity of problems addressed: Patient's presentation is most consistent with  acute presentation with potential threat to life or bodily function  Disposition: After consideration of the diagnostic results and the patient's response to treatment,  I feel that the patent would benefit from admission   .           Final Clinical Impression(s) / ED Diagnoses Final diagnoses:  TIA (transient ischemic attack)  Delirium    Rx / DC Orders ED Discharge Orders     None         Ripley Fraise, MD 02/25/22 [phone]

## 2022-02-25 NOTE — ED Notes (Signed)
Pt in radiology for [place] and MRI at this time.

## 2022-02-25 NOTE — Evaluation (Signed)
Clinical/Bedside Swallow Evaluation Patient Details  Name: Walter [name] MRN: [id number] Date of Birth: [birth year]  Today's Date: 02/25/2022 Time: SLP Start Time (ACUTE ONLY): 2248 SLP Stop Time (ACUTE ONLY): 2500 SLP Time Calculation (min) (ACUTE ONLY): 16 min  Past Medical History:  Past Medical History:  Diagnosis Date   Acute urinary retention 02/12/2014   Brain TIA    recurrent    C. difficile enteritis    Carotid bruit    COPD (chronic obstructive pulmonary disease) (HCC)    Coronary artery disease    Myocardial infarction ([place])    incidental   Nodule of kidney 02/14/14   solid on left   Recurrent colitis due to Clostridium difficile 02/11/2014   Stroke ([place]) 1997   Past Surgical History:  Past Surgical History:  Procedure Laterality Date   BACK SURGERY     HIP ARTHROPLASTY Left 09/09/2019   Procedure: ARTHROPLASTY BIPOLAR HIP (HEMIARTHROPLASTY);  Surgeon: Paralee Cancel, MD;  Location: [facility];  Service: Orthopedics;  Laterality: Left;   KNEE SURGERY     HPI:  Walter [name] is a 71 y.o. male with medical history significant of TIA, CVA, C. difficile colitis, COPD, coronary artery disease, hypertension, hyperlipidemia, and more presents to the ED with a chief complaint of altered mental status.  Patient is not able to provide any history.  Wife had reported to the ED provider that patient went to bed around midnight and was at baseline at that time.  A couple of hours later he was difficult to arouse, and was not speaking.  She called EMS for altered mental status.  In the ER at first he was aphasic.  He was also hypersomnolent.  Throughout his stay in the ER he has become more alert, the only worried he will say is "alright."  He is following commands and can move all 4 extremities.  Code stroke was done in the ER and he was determined not to be a candidate for acute intervention.  They recommended admission for stroke and metabolic work-up.    Assessment / Plan / Recommendation   Clinical Impression  Clinical swallowing evaluation completed while Pt was sitting upright in bed; Pt was very emotional, however, this is reportedly baseline. Pt consumed thin liquids with occasional delayed coughing, however, with cues to take his time with small sips note no coughing. Pt's wife reports fast rate of consumption at home and occasional "choking" as a result of this. Pt consumed regular and puree without overt s/sx of aspiration. Recommend initiate D3/mech soft diet and thin liquids with supervision for cues to take his time and alternate bites and sips. Recommend meds whole with liquids. ST will f/u X1 for dysphagia to ensure diet tolerance. SLP Visit Diagnosis: Dysphagia, unspecified (R13.10);Aphasia (R47.01);Dysarthria and anarthria (R47.1)    Aspiration Risk  Mild aspiration risk    Diet Recommendation Dysphagia 3 (Mech soft);Thin liquid   Liquid Administration via: Cup;Straw Medication Administration: Whole meds with liquid Supervision: Patient able to self feed Compensations: Minimize environmental distractions;Slow rate;Small sips/bites    Other  Recommendations Oral Care Recommendations: Oral care BID    Recommendations for follow up therapy are one component of a multi-disciplinary discharge planning process, led by the attending physician.  Recommendations may be updated based on patient status, additional functional criteria and insurance authorization.  Follow up Recommendations Home health SLP            Frequency and Duration min 1 x/week  1 week  Prognosis Barriers to Reach Goals: Language deficits;Severity of deficits;Time post onset      Swallow Study   General Date of Onset: 02/25/22 HPI: Walter [name] is a 71 y.o. male with medical history significant of TIA, CVA, C. difficile colitis, COPD, coronary artery disease, hypertension, hyperlipidemia, and more presents to the ED with a chief complaint of altered mental status.  Patient is not able  to provide any history.  Wife had reported to the ED provider that patient went to bed around midnight and was at baseline at that time.  A couple of hours later he was difficult to arouse, and was not speaking.  She called EMS for altered mental status.  In the ER at first he was aphasic.  He was also hypersomnolent.  Throughout his stay in the ER he has become more alert, the only worried he will say is "alright."  He is following commands and can move all 4 extremities.  Code stroke was done in the ER and he was determined not to be a candidate for acute intervention.  They recommended admission for stroke and metabolic work-up. Type of Study: Bedside Swallow Evaluation Previous Swallow Assessment: BSE & SLE in 2021 Diet Prior to this Study: NPO Temperature Spikes Noted: No Respiratory Status: Room air History of Recent Intubation: No Behavior/Cognition: Alert;Cooperative;Pleasant mood Oral Cavity Assessment: Within Functional Limits Oral Cavity - Dentition: Edentulous Vision: Functional for self-feeding Self-Feeding Abilities: Able to feed self Patient Positioning: Upright in bed Baseline Vocal Quality: Low vocal intensity Volitional Cough: Strong Volitional Swallow: Able to elicit    Oral/Motor/Sensory Function Overall Oral Motor/Sensory Function: Within functional limits   Ice Chips Ice chips: Within functional limits   Thin Liquid Thin Liquid: Within functional limits    Nectar Thick Nectar Thick Liquid: Not tested   Honey Thick Honey Thick Liquid: Not tested   Puree Puree: Within functional limits   Solid     Solid: Within functional limits     Teya Otterson H. Roddie Mc, [place] Speech Language Pathologist  Wende Bushy 02/25/2022,4:08 PM

## 2022-02-25 NOTE — ED Notes (Signed)
Pt now more alert and responsive. Only answering "alright" to any question. Able to wave and move all extremities.

## 2022-02-25 NOTE — Progress Notes (Signed)
Code stroke activated at [place], patient in [place]. Returned at [phone]. Dr. Lyndle Herrlich connected at [phone].  Everlena Cooper., [place] RN

## 2022-02-25 NOTE — Assessment & Plan Note (Signed)
-   working to normalize BP now that CVA has been ruled out with repeated CT scanning

## 2022-02-25 NOTE — Progress Notes (Signed)
Stroke screen/NIHSS performed on patient.  NIHSS is difficult to perform on patient due to his understanding.  Patient is only partially able to follow commands so doing entire scale is challenging.

## 2022-02-25 NOTE — Evaluation (Signed)
Speech Language Pathology Evaluation Patient Details Name: Walter [name] MRN: [id number] DOB: September 22, 1950 Today's Date: 02/25/2022 Time: 1556-1610 SLP Time Calculation (min) (ACUTE ONLY): 14 min  Problem List:  Patient Active Problem List   Diagnosis Date Noted   CVA (cerebral vascular accident) ([place]) 64/40/3474   Acute metabolic encephalopathy 25/95/6387   Coronary artery disease 02/25/2022   Leukocytosis 02/25/2022   Speech apraxia 12/04/2020   Vascular dementia ([place]) 12/04/2020   S/P left THA, AA 09/09/2019   DNR (do not resuscitate) 09/08/2019   Depression, recurrent ([place]) 04/06/2018   Hyperlipidemia, mixed 04/06/2018   HTN (hypertension) 01/12/2015   BPH (benign prostatic hyperplasia) 01/12/2015   Nodule of kidney 02/14/2014   Weakness 02/11/2014   Recurrent colitis due to Clostridium difficile 02/11/2014   Occlusion and stenosis of carotid artery without mention of cerebral infarction 09/07/2012   History of CVA (cerebrovascular accident) 02/18/2012   Tobacco abuse 02/18/2012   Non-compliance 02/18/2012   COPD (chronic obstructive pulmonary disease) ([place]) 02/18/2012   Past Medical History:  Past Medical History:  Diagnosis Date   Acute urinary retention 02/12/2014   Brain TIA    recurrent    C. difficile enteritis    Carotid bruit    COPD (chronic obstructive pulmonary disease) ([place])    Coronary artery disease    Myocardial infarction ([place])    incidental   Nodule of kidney 02/14/14   solid on left   Recurrent colitis due to Clostridium difficile 02/11/2014   Stroke ([place]) 1997   Past Surgical History:  Past Surgical History:  Procedure Laterality Date   BACK SURGERY     HIP ARTHROPLASTY Left 09/09/2019   Procedure: ARTHROPLASTY BIPOLAR HIP (HEMIARTHROPLASTY);  Surgeon: Paralee Cancel, MD;  Location: [facility];  Service: Orthopedics;  Laterality: Left;   KNEE SURGERY     HPI:  Walter [name] is a 71 y.o. male with medical history significant of TIA, CVA, C. difficile  colitis, COPD, coronary artery disease, hypertension, hyperlipidemia, and more presents to the ED with a chief complaint of altered mental status.  Patient is not able to provide any history.  Wife had reported to the ED provider that patient went to bed around midnight and was at baseline at that time.  A couple of hours later he was difficult to arouse, and was not speaking.  She called EMS for altered mental status.  In the ER at first he was aphasic.  He was also hypersomnolent.  Throughout his stay in the ER he has become more alert, the only worried he will say is "alright."  He is following commands and can move all 4 extremities.  Code stroke was done in the ER and he was determined not to be a candidate for acute intervention.  They recommended admission for stroke and metabolic work-up.   Assessment / Plan / Recommendation Clinical Impression  Very brief speech and language evaluation completed after BSE as a result of Pt's lethargy and emotional state. Please note Pt is tired from being up most of the night in the ED and performance was likely negatively impacted by this. Pt seems to present with severe expressive aphasia and some receptive aphasia (difficult to assess severity) and dysarthria. Pt is very emotional and cried when asked any direct/pointed question. Pt did state his name "Walter [name]" but was unable to tell me his whole name. Pt could not/would not count with SLP and was unable to answer any other question asked. When asked to identify objects or name  things he only cried. Pt did wave goodbye and use gestures appropriately throughout evaluation. Pt's wife, present for evaluation reports she is "unsure" if this is a change - at baseline he "cries a lot, doesn't talk much and is hard to understand". It is difficult to further assess Pt's ability today as he is very tired and emotional despite various approaches. ST will continue efforts via ongoing diagnostic evaluation/treatment while in acute  care and recommend ST f/u at discharge venue for more in depth assessment and treatment as indicated. Thank you for this referral,     SLP Assessment  SLP Visit Diagnosis: Dysphagia, unspecified (R13.10);Aphasia (R47.01);Dysarthria and anarthria (R47.1)    Recommendations for follow up therapy are one component of a multi-disciplinary discharge planning process, led by the attending physician.  Recommendations may be updated based on patient status, additional functional criteria and insurance authorization.    Follow Up Recommendations  Home health SLP          Frequency and Duration min 1 x/week         SLP Evaluation Cognition  Overall Cognitive Status: Difficult to assess Orientation Level: Oriented to person;Disoriented to place;Disoriented to time;Disoriented to situation       Comprehension  Auditory Comprehension Overall Auditory Comprehension: Impaired at baseline    Expression Expression Primary Mode of Expression: Verbal Verbal Expression Overall Verbal Expression: Impaired Written Expression Dominant Hand: Right   Oral / Motor  Oral Motor/Sensory Function Overall Oral Motor/Sensory Function: Within functional limits             Walter [name], CCC-SLP Speech Language Pathologist  Walter [name] 02/25/2022, 4:11 PM

## 2022-02-26 ENCOUNTER — Observation Stay (HOSPITAL_COMMUNITY): Payer: Medicare HMO

## 2022-02-26 DIAGNOSIS — I10 Essential (primary) hypertension: Secondary | ICD-10-CM | POA: Diagnosis present

## 2022-02-26 DIAGNOSIS — E782 Mixed hyperlipidemia: Secondary | ICD-10-CM | POA: Diagnosis present

## 2022-02-26 DIAGNOSIS — J690 Pneumonitis due to inhalation of food and vomit: Secondary | ICD-10-CM | POA: Diagnosis present

## 2022-02-26 DIAGNOSIS — Z8249 Family history of ischemic heart disease and other diseases of the circulatory system: Secondary | ICD-10-CM | POA: Diagnosis not present

## 2022-02-26 DIAGNOSIS — G9341 Metabolic encephalopathy: Secondary | ICD-10-CM | POA: Diagnosis present

## 2022-02-26 DIAGNOSIS — Z7902 Long term (current) use of antithrombotics/antiplatelets: Secondary | ICD-10-CM | POA: Diagnosis not present

## 2022-02-26 DIAGNOSIS — Z8673 Personal history of transient ischemic attack (TIA), and cerebral infarction without residual deficits: Secondary | ICD-10-CM | POA: Diagnosis not present

## 2022-02-26 DIAGNOSIS — R1310 Dysphagia, unspecified: Secondary | ICD-10-CM | POA: Diagnosis present

## 2022-02-26 DIAGNOSIS — Z7401 Bed confinement status: Secondary | ICD-10-CM | POA: Diagnosis not present

## 2022-02-26 DIAGNOSIS — Z96642 Presence of left artificial hip joint: Secondary | ICD-10-CM | POA: Diagnosis present

## 2022-02-26 DIAGNOSIS — D72829 Elevated white blood cell count, unspecified: Secondary | ICD-10-CM | POA: Diagnosis not present

## 2022-02-26 DIAGNOSIS — J439 Emphysema, unspecified: Secondary | ICD-10-CM | POA: Diagnosis not present

## 2022-02-26 DIAGNOSIS — I2510 Atherosclerotic heart disease of native coronary artery without angina pectoris: Secondary | ICD-10-CM | POA: Diagnosis present

## 2022-02-26 DIAGNOSIS — R4182 Altered mental status, unspecified: Secondary | ICD-10-CM | POA: Diagnosis not present

## 2022-02-26 DIAGNOSIS — J9690 Respiratory failure, unspecified, unspecified whether with hypoxia or hypercapnia: Secondary | ICD-10-CM | POA: Diagnosis not present

## 2022-02-26 DIAGNOSIS — G4710 Hypersomnia, unspecified: Secondary | ICD-10-CM | POA: Diagnosis present

## 2022-02-26 DIAGNOSIS — I639 Cerebral infarction, unspecified: Secondary | ICD-10-CM | POA: Diagnosis not present

## 2022-02-26 DIAGNOSIS — Z79899 Other long term (current) drug therapy: Secondary | ICD-10-CM | POA: Diagnosis not present

## 2022-02-26 DIAGNOSIS — F01511 Vascular dementia, unspecified severity, with agitation: Secondary | ICD-10-CM

## 2022-02-26 DIAGNOSIS — F01518 Vascular dementia, unspecified severity, with other behavioral disturbance: Secondary | ICD-10-CM | POA: Diagnosis present

## 2022-02-26 DIAGNOSIS — Z66 Do not resuscitate: Secondary | ICD-10-CM | POA: Diagnosis present

## 2022-02-26 DIAGNOSIS — Z87891 Personal history of nicotine dependence: Secondary | ICD-10-CM | POA: Diagnosis not present

## 2022-02-26 DIAGNOSIS — G459 Transient cerebral ischemic attack, unspecified: Secondary | ICD-10-CM | POA: Diagnosis present

## 2022-02-26 DIAGNOSIS — I700 Atherosclerosis of aorta: Secondary | ICD-10-CM | POA: Diagnosis not present

## 2022-02-26 DIAGNOSIS — R4701 Aphasia: Secondary | ICD-10-CM | POA: Diagnosis present

## 2022-02-26 DIAGNOSIS — I252 Old myocardial infarction: Secondary | ICD-10-CM | POA: Diagnosis not present

## 2022-02-26 DIAGNOSIS — R569 Unspecified convulsions: Secondary | ICD-10-CM | POA: Diagnosis not present

## 2022-02-26 DIAGNOSIS — Z7982 Long term (current) use of aspirin: Secondary | ICD-10-CM | POA: Diagnosis not present

## 2022-02-26 DIAGNOSIS — J449 Chronic obstructive pulmonary disease, unspecified: Secondary | ICD-10-CM | POA: Diagnosis present

## 2022-02-26 DIAGNOSIS — F32A Depression, unspecified: Secondary | ICD-10-CM | POA: Diagnosis present

## 2022-02-26 DIAGNOSIS — R29714 NIHSS score 14: Secondary | ICD-10-CM | POA: Diagnosis present

## 2022-02-26 DIAGNOSIS — R0989 Other specified symptoms and signs involving the circulatory and respiratory systems: Secondary | ICD-10-CM | POA: Diagnosis not present

## 2022-02-26 DIAGNOSIS — Z823 Family history of stroke: Secondary | ICD-10-CM | POA: Diagnosis not present

## 2022-02-26 LAB — SEDIMENTATION RATE: Sed Rate: 13 mm/hr (ref 0–16)

## 2022-02-26 LAB — CBC
HCT: 51.1 % (ref 39.0–52.0)
Hemoglobin: 16.7 g/dL (ref 13.0–17.0)
MCH: 29.8 pg (ref 26.0–34.0)
MCHC: 32.7 g/dL (ref 30.0–36.0)
MCV: 91.1 fL (ref 80.0–100.0)
Platelets: 299 10*3/uL (ref 150–400)
RBC: 5.61 MIL/uL (ref 4.22–5.81)
RDW: 13.6 % (ref 11.5–15.5)
WBC: 11.7 10*3/uL — ABNORMAL HIGH (ref 4.0–10.5)
nRBC: 0 % (ref 0.0–0.2)

## 2022-02-26 LAB — LIPID PANEL
Cholesterol: 195 mg/dL (ref 0–200)
HDL: 36 mg/dL — ABNORMAL LOW (ref >40)
LDL Cholesterol: 138 mg/dL — ABNORMAL HIGH (ref 0–99)
Total CHOL/HDL Ratio: 5.4 RATIO
Triglycerides: 106 mg/dL (ref <150)
VLDL: 21 mg/dL (ref 0–40)

## 2022-02-26 LAB — COMPREHENSIVE METABOLIC PANEL
ALT: 12 U/L (ref 0–44)
AST: 18 U/L (ref 15–41)
Albumin: 3.9 g/dL (ref 3.5–5.0)
Alkaline Phosphatase: 82 U/L (ref 38–126)
Anion gap: 13 (ref 5–15)
BUN: 13 mg/dL (ref 8–23)
CO2: 20 mmol/L — ABNORMAL LOW (ref 22–32)
Calcium: 9.4 mg/dL (ref 8.9–10.3)
Chloride: 105 mmol/L (ref 98–111)
Creatinine, Ser: 1.04 mg/dL (ref 0.61–1.24)
GFR, Estimated: >60 mL/min (ref >60)
Glucose, Bld: 110 mg/dL — ABNORMAL HIGH (ref 70–99)
Potassium: 4.1 mmol/L (ref 3.5–5.1)
Sodium: 138 mmol/L (ref 135–145)
Total Bilirubin: 0.9 mg/dL (ref 0.3–1.2)
Total Protein: 8.1 g/dL (ref 6.5–8.1)

## 2022-02-26 LAB — T4, FREE: Free T4: 1.03 ng/dL (ref 0.61–1.12)

## 2022-02-26 LAB — FOLATE: Folate: 9.1 ng/mL (ref >5.9)

## 2022-02-26 LAB — VITAMIN B12: Vitamin B-12: 256 pg/mL (ref 180–914)

## 2022-02-26 MED ORDER — QUETIAPINE FUMARATE 25 MG PO TABS
25.0000 mg | ORAL_TABLET | Freq: Every day | ORAL | Status: DC
  Administered 2022-02-26 – 2022-02-28 (×3): 25 mg via ORAL
  Filled 2022-02-26 (×3): qty 1

## 2022-02-26 MED ORDER — HALOPERIDOL LACTATE 5 MG/ML IJ SOLN
2.0000 mg | Freq: Once | INTRAMUSCULAR | Status: AC
  Administered 2022-02-26: 2 mg via INTRAVENOUS
  Filled 2022-02-26: qty 1

## 2022-02-26 MED ORDER — LORAZEPAM 2 MG/ML IJ SOLN
1.0000 mg | INTRAMUSCULAR | Status: DC | PRN
  Administered 2022-02-26: 1 mg via INTRAVENOUS
  Filled 2022-02-26: qty 1

## 2022-02-26 MED ORDER — ATORVASTATIN CALCIUM 40 MG PO TABS
40.0000 mg | ORAL_TABLET | Freq: Every evening | ORAL | Status: DC
  Administered 2022-02-26 – 2022-03-01 (×4): 40 mg via ORAL
  Filled 2022-02-26 (×4): qty 1

## 2022-02-26 MED ORDER — HALOPERIDOL LACTATE 5 MG/ML IJ SOLN
5.0000 mg | Freq: Four times a day (QID) | INTRAMUSCULAR | Status: DC | PRN
  Administered 2022-02-26: 5 mg via INTRAVENOUS
  Filled 2022-02-26: qty 1

## 2022-02-26 NOTE — Hospital Course (Signed)
71 y.o. male with medical history significant of TIA, CVA, C. difficile colitis, COPD, coronary artery disease, hypertension, hyperlipidemia, and more presents to the ED with a chief complaint of altered mental status.  Patient is not able to provide any history.  Wife had reported to the ED provider that patient went to bed around midnight and was at baseline at that time.  A couple of hours later he was difficult to arouse, and was not speaking.  She called EMS for altered mental status.  In the ER at first he was aphasic.  He was also hypersomnolent.  Throughout his stay in the ER he has become more alert, the only worried he will say is "alright."  He is following commands and can move all 4 extremities.  Code stroke was done in the ER and he was determined not to be a candidate for acute intervention.  They recommended admission for stroke and metabolic work-up.   It is documented in previous charts that patient is DNR.

## 2022-02-26 NOTE — Plan of Care (Signed)
  Problem: Acute Rehab PT Goals(only PT should resolve) Goal: Pt Will Go Supine/Side To Sit Outcome: Progressing Flowsheets (Taken 02/26/2022 1006) Pt will go Supine/Side to Sit: with min guard assist Goal: Patient Will Transfer Sit To/From Stand Outcome: Progressing Flowsheets (Taken 02/26/2022 1006) Patient will transfer sit to/from stand: with min guard assist Goal: Pt Will Transfer Bed To Chair/Chair To Bed Outcome: Progressing Flowsheets (Taken 02/26/2022 1006) Pt will Transfer Bed to Chair/Chair to Bed: min guard assist Goal: Pt Will Ambulate Outcome: Progressing Flowsheets (Taken 02/26/2022 1006) Pt will Ambulate:  50 feet  with min guard assist  with rolling walker

## 2022-02-26 NOTE — Progress Notes (Signed)
PROGRESS NOTE   Walter [name]  ZOX:[id number] DOB: [birth year] DOA: 02/25/2022 PCP: Dettinger, Fransisca Kaufmann, MD   Chief Complaint  Patient presents with   Altered Mental Status   Level of care: Telemetry  Brief Admission History:   71 y.o. male with medical history significant of TIA, CVA, C. difficile colitis, COPD, coronary artery disease, hypertension, hyperlipidemia, and more presents to the ED with a chief complaint of altered mental status.  Patient is not able to provide any history.  Wife had reported to the ED provider that patient went to bed around midnight and was at baseline at that time.  A couple of hours later he was difficult to arouse, and was not speaking.  She called EMS for altered mental status.  In the ER at first he was aphasic.  He was also hypersomnolent.  Throughout his stay in the ER he has become more alert, the only worried he will say is "alright."  He is following commands and can move all 4 extremities.  Code stroke was done in the ER and he was determined not to be a candidate for acute intervention.  They recommended admission for stroke and metabolic work-up.   It is documented in previous charts that patient is DNR.   Assessment and Plan: * Question of acute CVA (cerebral vascular accident) - Pt was unable to tolerate MRI and repeat CT was done 24 hours after first CT scan.  - no evidence of acute CVA found. - continue aspirin plavix that he was on prior to admission  --PT recommending SNF and after discussing with wife she would like to pursue SNF placement; --TOC consulted for SNF placement   Leukocytosis - UA is not indicative of UTI - No respiratory symptoms observed, patient maintaining oxygen sats on room air No other overt signs of infection found   Coronary artery disease - Continue aspirin, Plavix, statin - Monitor on telemetry  Acute metabolic encephalopathy - he seems to be getting back to his baseline - Patient does have a history of  speech apraxia - Code stroke was done patient was not a candidate for acute intervention; no evidence of acute CVA found  - Teleneurologist advised stroke work-up and metabolic work-up; no cause found  - UDS negative, alcohol level undetectable - Check nutritional deficiencies - TSH and thyroid function stable.  -- follow up Dementia lab panel (still pending) - MRI unable to be completed as patient could not tolerate test - Continue to monitor  Vascular dementia ([place]) - Pt has intermittent confusion; monitor closely  -- lipids are uncontrolled, continue atorvastatin 40 mg daily (suspect wasn't taking it at home) -- follow up dementia lab panel (still pending)  Hyperlipidemia, mixed - Continue statin  HTN (hypertension) - Permissive hypertension - Holding Norvasc - Treat for blood pressure greater than 220/110   DVT prophylaxis: [employer] heparin Code Status: DNR  Family Communication: wife bedside update  Disposition: anticipating STR SNF placement     Consultants:   Procedures:   Antimicrobials:    Subjective: Pt has been intermittently confused, closer to his baseline per wife.   Objective: Vitals:   02/25/22 2204 02/26/22 0200 02/26/22 0656 02/26/22 0702  BP: 132/79 (!) 145/82  (!) 163/95  Pulse: 81 91 91 (!) 105  Resp: '14 16 18   '$ Temp: 97.7 F (36.5 C) 98 F (36.7 C) (!) 96.7 F (35.9 C)   TempSrc: Oral  Axillary   SpO2: 92% 94% 96%   Height:  Intake/Output Summary (Last 24 hours) at 02/26/2022 1254 Last data filed at 02/26/2022 0900 Gross per 24 hour  Intake 360 ml  Output --  Net 360 ml   There were no vitals filed for this visit. Examination:  General exam: Appears calm and comfortable  Respiratory system: Clear to auscultation. Respiratory effort normal. Cardiovascular system: normal S1 & S2 heard. No JVD, murmurs, rubs, gallops or clicks. No pedal edema. Gastrointestinal system: Abdomen is nondistended, soft and nontender. No organomegaly or  masses felt. Normal bowel sounds heard. Central nervous system: Alert and confused with dementia. No focal neurological deficits. Extremities: Symmetric 5 x 5 power. Skin: No rashes, lesions or ulcers. Psychiatry: Judgement and insight appear poor. Mood & affect agitated.   Data Reviewed: I have personally reviewed following labs and imaging studies  CBC: Recent Labs  Lab 02/25/22 0307 02/26/22 0401  WBC 11.2* 11.7*  NEUTROABS 7.7  --   HGB 16.2 16.7  HCT 49.1 51.1  MCV 89.4 91.1  PLT 300 193    Basic Metabolic Panel: Recent Labs  Lab 02/25/22 0307 02/26/22 0401  NA 134* 138  K 3.8 4.1  CL 107 105  CO2 20* 20*  GLUCOSE 122* 110*  BUN 13 13  CREATININE 1.16 1.04  CALCIUM 8.7* 9.4    CBG: Recent Labs  Lab 02/25/22 0316  GLUCAP 139*    No results found for this or any previous visit (from the past 240 hour(s)).   Radiology Studies: CT HEAD WO CONTRAST (5MM)  Result Date: 02/26/2022 CLINICAL DATA:  Altered mental status, stroke follow-up.  Seizure. EXAM: CT HEAD WITHOUT CONTRAST TECHNIQUE: Contiguous axial images were obtained from the base of the skull through the vertex without intravenous contrast. RADIATION DOSE REDUCTION: This exam was performed according to the departmental dose-optimization program which includes automated exposure control, adjustment of the mA and/or kV according to patient size and/or use of iterative reconstruction technique. COMPARISON:  CT head 1 day prior FINDINGS: Brain: There is no acute intracranial hemorrhage, extra-axial fluid collection, or acute infarct Background parenchymal volume loss with prominence of the ventricular system and extra-axial CSF spaces is stable. Remote infarcts in the bilateral basal ganglia and background advanced chronic small-vessel ischemic change are stable. There is no mass lesion.  There is no mass effect or midline shift. Vascular: There is calcification of the bilateral carotid siphons and vertebral  arteries. Skull: Normal. Negative for fracture or focal lesion. Sinuses/Orbits: There is mild mucosal thickening in the ethmoid air cells. Bilateral lens implants are in place. The globes and orbits are otherwise unremarkable. Other: None. IMPRESSION: Stable noncontrast head CT with no acute intracranial pathology. Electronically Signed   By: Valetta Mole M.D.   On: 02/26/2022 12:05   ECHOCARDIOGRAM COMPLETE  Result Date: 02/25/2022    ECHOCARDIOGRAM REPORT   Patient Name:   MONTERIUS ROLF Date of Exam: 02/25/2022 Medical Rec #:  [id number]    Height:       73.0 in Accession #:    [id number]   Weight:       191.0 lb Date of Birth:  1950/06/15    BSA:          2.110 m Patient Age:    61 years     BP:           183/102 mmHg Patient Gender: M            HR:           91 bpm. Exam  Location:  [facility] Procedure: 2D Echo, Cardiac Doppler and Color Doppler Indications:    Stroke  History:        Patient has prior history of Echocardiogram examinations, most                 recent 02/18/2012. Signs/Symptoms:Altered Mental Status; Risk                 Factors:Hypertension, Dyslipidemia and Former Smoker.  Sonographer:    Wenda Low Referring Phys: [id number] ASIA B Dante  Sonographer Comments: Image acquisition challenging due to patient behavioral factors. IMPRESSIONS  1. Left ventricular ejection fraction, by estimation, is 65 to 70%. The left ventricle has normal function. The left ventricle has no regional wall motion abnormalities. There is severe asymmetric left ventricular hypertrophy of the septal segment. Left  ventricular diastolic parameters are consistent with Grade I diastolic dysfunction (impaired relaxation).  2. Right ventricular systolic function is normal. The right ventricular size is normal. Tricuspid regurgitation signal is inadequate for assessing PA pressure.  3. The mitral valve is grossly normal. No evidence of mitral valve regurgitation. No evidence of mitral stenosis.  4. The aortic  valve is tricuspid. Aortic valve regurgitation is not visualized. No aortic stenosis is present.  5. There is mild dilatation of the aortic root, measuring 40 mm. Comparison(s): LV more vigorous from 2013 report. Aortic root size larger that prior reporting. FINDINGS  Left Ventricle: Left ventricular ejection fraction, by estimation, is 65 to 70%. The left ventricle has normal function. The left ventricle has no regional wall motion abnormalities. The left ventricular internal cavity size was normal in size. There is  severe asymmetric left ventricular hypertrophy of the septal segment. Left ventricular diastolic parameters are consistent with Grade I diastolic dysfunction (impaired relaxation). Right Ventricle: The right ventricular size is normal. No increase in right ventricular wall thickness. Right ventricular systolic function is normal. Tricuspid regurgitation signal is inadequate for assessing PA pressure. Left Atrium: Left atrial size was normal in size. Right Atrium: Right atrial size was normal in size. Pericardium: Trivial pericardial effusion is present. The pericardial effusion is anterior to the right ventricle. Presence of epicardial fat layer. Mitral Valve: The mitral valve is grossly normal. No evidence of mitral valve regurgitation. No evidence of mitral valve stenosis. MV peak gradient, 2.9 mmHg. The mean mitral valve gradient is 1.0 mmHg. Tricuspid Valve: The tricuspid valve is normal in structure. Tricuspid valve regurgitation is not demonstrated. No evidence of tricuspid stenosis. Aortic Valve: The aortic valve is tricuspid. Aortic valve regurgitation is not visualized. No aortic stenosis is present. Aortic valve mean gradient measures 1.0 mmHg. Aortic valve peak gradient measures 2.8 mmHg. Aortic valve area, by VTI measures 4.25 cm. Pulmonic Valve: The pulmonic valve was normal in structure. Pulmonic valve regurgitation is not visualized. No evidence of pulmonic stenosis. Aorta: The  ascending aorta was not well visualized. There is mild dilatation of the aortic root, measuring 40 mm. Venous: The inferior vena cava was not well visualized. IAS/Shunts: No atrial level shunt detected by color flow Doppler.  LEFT VENTRICLE PLAX 2D LVIDd:         4.30 cm   Diastology LVIDs:         2.65 cm   LV e' medial:  6.85 cm/s LV PW:         1.30 cm   LV e' lateral: 6.96 cm/s LV IVS:        1.50 cm LVOT diam:  2.40 cm LV SV:         63 LV SV Index:   30 LVOT Area:     4.52 cm  LEFT ATRIUM             Index LA diam:        4.10 cm 1.94 cm/m LA Vol (A2C):   53.9 ml 25.55 ml/m LA Vol (A4C):   40.4 ml 19.15 ml/m LA Biplane Vol: 51.8 ml 24.55 ml/m  AORTIC VALVE                    PULMONIC VALVE AV Area (Vmax):    4.42 cm     PV Vmax:       0.96 m/s AV Area (Vmean):   4.87 cm     PV Peak grad:  3.7 mmHg AV Area (VTI):     4.25 cm AV Vmax:           84.10 cm/s AV Vmean:          52.900 cm/s AV VTI:            0.149 m AV Peak Grad:      2.8 mmHg AV Mean Grad:      1.0 mmHg LVOT Vmax:         82.20 cm/s LVOT Vmean:        57.000 cm/s LVOT VTI:          0.140 m LVOT/AV VTI ratio: 0.94  AORTA Ao Root diam: 4.10 cm Ao Asc diam:  3.60 cm MITRAL VALVE MV Area (PHT): 4.58 cm   SHUNTS MV Area VTI:   3.77 cm   Systemic VTI:  0.14 m MV Peak grad:  2.9 mmHg   Systemic Diam: 2.40 cm MV Mean grad:  1.0 mmHg MV Vmax:       0.86 m/s MV Vmean:      49.9 cm/s Rudean Haskell MD Electronically signed by Rudean Haskell MD Signature Date/Time: 02/25/2022/11:19:00 AM    Final    US Carotid Bilateral  Result Date: 02/25/2022 CLINICAL DATA:  Altered mental status EXAM: BILATERAL CAROTID DUPLEX ULTRASOUND TECHNIQUE: Pearline Cables scale imaging, color Doppler and duplex ultrasound were performed of bilateral carotid and vertebral arteries in the neck. COMPARISON:  01/27/2020 CTA neck FINDINGS: Criteria: Quantification of carotid stenosis is based on velocity parameters that correlate the residual internal carotid diameter  with NASCET-based stenosis levels, using the diameter of the distal internal carotid lumen as the denominator for stenosis measurement. The following velocity measurements were obtained: RIGHT ICA: 345/46 cm/sec CCA: 657/84 cm/sec SYSTOLIC ICA/CCA RATIO:  2 9 ECA: 106 cm/sec LEFT ICA: 74/16 cm/sec CCA: 696/29 cm/sec SYSTOLIC ICA/CCA RATIO:  0.6 ECA: 39 cm/sec RIGHT CAROTID ARTERY: Proximal mid ICA mixed echogenicity atherosclerosis. At this location, there is marked velocity elevation measuring 345/46 centimeters/second with some degree of turbulent flow/spectral broadening. Right ICA stenosis estimated at greater than 70% by ultrasound criteria. RIGHT VERTEBRAL ARTERY:  Antegrade flow LEFT CAROTID ARTERY: Similar scattered minor echogenic plaque formation. No hemodynamically significant left ICA stenosis, velocity elevation, or turbulent flow. LEFT VERTEBRAL ARTERY:  Not visualized IMPRESSION: 1. Right ICA stenosis estimated at greater than 70%. 2. Left ICA stenosis less than 50%. 3. Patent antegrade right vertebral flow. 4. Nonvisualized left vertebral artery. Electronically Signed   By: Jerilynn Mages.  Shick M.D.   On: 02/25/2022 10:51   CT HEAD CODE STROKE WO CONTRAST  Result Date: 02/25/2022 CLINICAL DATA:  Code stroke.  Confusion and altered  mental status EXAM: CT HEAD WITHOUT CONTRAST TECHNIQUE: Contiguous axial images were obtained from the base of the skull through the vertex without intravenous contrast. RADIATION DOSE REDUCTION: This exam was performed according to the departmental dose-optimization program which includes automated exposure control, adjustment of the mA and/or kV according to patient size and/or use of iterative reconstruction technique. COMPARISON:  01/27/2020 FINDINGS: Evaluation is somewhat limited by motion artifact. Brain: No evidence of acute infarction, hemorrhage, cerebral edema, mass, mass effect, or midline shift. No hydrocephalus or extra-axial collection. Redemonstrated remote  infarcts in the bilateral basal ganglia. Periventricular white matter changes, likely the sequela of chronic small vessel ischemic disease. Vascular: No hyperdense vessel. Atherosclerotic calcifications in the intracranial carotid and vertebral arteries. Skull: Negative for fracture or focal lesion. Sinuses/Orbits: Clear paranasal sinuses. Status post bilateral lens replacements. Other: The mastoid air cells are well aerated. ASPECTS [facility] Stroke Program Early CT Score) - Ganglionic level infarction (caudate, lentiform nuclei, internal capsule, insula, M1-M3 cortex): 7 - Supraganglionic infarction (M4-M6 cortex): 3 Total score (0-10 with 10 being normal): 10 IMPRESSION: 1. No acute intracranial process. 2. ASPECTS is 10 Code stroke imaging results were communicated on 02/25/2022 at 3:20 am to provider [facility] via telephone, who verbally acknowledged these results. Electronically Signed   By: Merilyn Baba M.D.   On: 02/25/2022 03:21    Scheduled Meds:  aspirin EC  81 mg Oral Daily   atorvastatin  40 mg Oral QPM   clopidogrel  75 mg Oral Daily   heparin  5,000 Units Subcutaneous Q8H   Continuous Infusions:   LOS: 0 days   Time spent: 35 mins  Siedah Sedor Wynetta Emery, MD How to contact the [facility] Attending or Consulting provider [place] or covering provider during after hours [place], for this patient?  Check the care team in [facility] and look for a) attending/consulting TR[facility] provider listed and b) the [facility] team listed Log into [email or web address] and use 's universal password to access. If you do not have the password, please contact the hospital operator. Locate the [facility] provider you are looking for under [facility] and page to a number that you can be directly reached. If you still have difficulty reaching the provider, please page the [facility] (Director on Call) for the Hospitalists listed on amion for assistance.  02/26/2022, 12:54 PM

## 2022-02-26 NOTE — NC FL2 (Signed)
[place] LEVEL OF CARE SCREENING TOOL     IDENTIFICATION  Patient Name: Walter [name] Birthdate: 03-Jan-1951 Sex: male Admission Date (Current Location): 02/25/2022  [facility] and [place] Number:  [employer] and Address:  [place] [address] [place]      Provider Number: [id number]  Attending Physician Name and Address:  Murlean Iba, MD  Relative Name and Phone Number:       Current Level of Care: Hospital Recommended Level of Care: [place] Prior Approval Number:    Date Approved/Denied:   PASRR Number: [id number]  Discharge Plan: SNF    Current Diagnoses: Patient Active Problem List   Diagnosis Date Noted   CVA (cerebral vascular accident) ([place]) 16/60/6301   Acute metabolic encephalopathy 60/01/9322   Coronary artery disease 02/25/2022   Leukocytosis 02/25/2022   Speech apraxia 12/04/2020   Vascular dementia ([place]) 12/04/2020   S/P left THA, AA 09/09/2019   DNR (do not resuscitate) 09/08/2019   Depression, recurrent ([place]) 04/06/2018   Hyperlipidemia, mixed 04/06/2018   HTN (hypertension) 01/12/2015   BPH (benign prostatic hyperplasia) 01/12/2015   Nodule of kidney 02/14/2014   Weakness 02/11/2014   Recurrent colitis due to Clostridium difficile 02/11/2014   Occlusion and stenosis of carotid artery without mention of cerebral infarction 09/07/2012   History of CVA (cerebrovascular accident) 02/18/2012   Tobacco abuse 02/18/2012   Non-compliance 02/18/2012   COPD (chronic obstructive pulmonary disease) ([place]) 02/18/2012    Orientation RESPIRATION BLADDER Height & Weight     Self, Time, Situation, Place  Normal Continent, External catheter Weight:   Height:  '6\' 1"'$  (185.4 cm)  BEHAVIORAL SYMPTOMS/MOOD NEUROLOGICAL BOWEL NUTRITION STATUS      Continent Diet (See discharge summary)  AMBULATORY STATUS COMMUNICATION OF NEEDS Skin   Limited Assist Verbally Normal                        Personal Care Assistance Level of Assistance  Bathing, Feeding, Dressing Bathing Assistance: Limited assistance Feeding assistance: Limited assistance Dressing Assistance: Limited assistance     Functional Limitations Info  Sight, Hearing, Speech Sight Info: Adequate Hearing Info: Adequate Speech Info: Adequate    SPECIAL CARE FACTORS FREQUENCY  PT (By licensed PT), OT (By licensed OT)     PT Frequency: 5x weekly OT Frequency: 5x weekly            Contractures Contractures Info: Not present    Additional Factors Info  Code Status, Allergies Code Status Info: DNR Allergies Info: No known allergies           Current Medications (02/26/2022):  This is the current hospital active medication list Current Facility-Administered Medications  Medication Dose Route Frequency Provider Last Rate Last Admin   acetaminophen (TYLENOL) tablet 650 mg  650 mg Oral Q4H PRN Zierle-Ghosh, Asia B, DO       Or   acetaminophen (TYLENOL) 160 MG/5ML solution 650 mg  650 mg Per Tube Q4H PRN Zierle-Ghosh, Asia B, DO       Or   acetaminophen (TYLENOL) suppository 650 mg  650 mg Rectal Q4H PRN Zierle-Ghosh, Asia B, DO       aspirin EC tablet 81 mg  81 mg Oral Daily Johnson, Clanford L, MD   81 mg at 02/26/22 0807   atorvastatin (LIPITOR) tablet 40 mg  40 mg Oral QPM Johnson, Clanford L, MD       clopidogrel (PLAVIX)  tablet 75 mg  75 mg Oral Daily Johnson, Clanford L, MD   75 mg at 02/26/22 3552   haloperidol lactate (HALDOL) injection 5 mg  5 mg Intravenous Q6H PRN Johnson, Clanford L, MD   5 mg at 02/26/22 1021   heparin injection 5,000 Units  5,000 Units Subcutaneous Q8H Zierle-Ghosh, Asia B, DO   5,000 Units at 02/25/22 2150   labetalol (NORMODYNE) injection 5 mg  5 mg Intravenous Q2H PRN Johnson, Clanford L, MD   5 mg at 02/25/22 1901   senna-docusate (Senokot-S) tablet 1 tablet  1 tablet Oral QHS PRN Zierle-Ghosh, Asia B, DO         Discharge Medications: Please see discharge  summary for a list of discharge medications.  Relevant Imaging Results:  Relevant Lab Results:   Additional Information SSN: [id number]  Archie Endo, LCSW

## 2022-02-26 NOTE — Progress Notes (Addendum)
2:20pm: CSW spoke with patient's wife who requested patient's clinicals be sent to [facility] for review. CSW sent clinicals in [place].  11:50am: CSW completed FL2 and faxed patient's information out for review.  Madilyn Fireman, MSW, LCSW Transitions of Care  Clinical Social Worker II [phone]

## 2022-02-26 NOTE — Evaluation (Signed)
Physical Therapy Evaluation Patient Details Name: Walter [name] MRN: [id number] DOB: 01-08-1951 Today's Date: 02/26/2022  History of Present Illness  CARTEZ MOGLE is a 71 y.o. male with medical history significant of TIA, CVA, C. difficile colitis, COPD, coronary artery disease, hypertension, hyperlipidemia, and more presents to the ED with a chief complaint of altered mental status.  Patient is not able to provide any history.  Wife had reported to the ED provider that patient went to bed around midnight and was at baseline at that time.  A couple of hours later he was difficult to arouse, and was not speaking.  She called EMS for altered mental status.  In the ER at first he was aphasic.  He was also hypersomnolent.  Throughout his stay in the ER he has become more alert, the only worried he will say is "alright."  He is following commands and can move all 4 extremities.  Code stroke was done in the ER and he was determined not to be a candidate for acute intervention.  They recommended admission for stroke and metabolic work-up.    Clinical Impression  On therapist arrival, nursing present.  States patient has been trying to get out of bed.  Noted fall mat down and bed alarm on.  Patient seems to understand PT questions; can answer yes and no but unsure of his accuracy.  Answers that require more than a yes or no are very difficult to understand and mostly cannot be understood and patient expresses frustration and agitation with further questioning.  He is able to perform supine to sit with HOB slightly elevated and min A to fully come up to sitting.  Once on the edge of bed, he requires both hands on the mattress to maintain his sitting balance; otherwise he tends to lean to the left or backwards.  Patient performs sit to stand to RW with  min A from therapist for balance.  He tends to stand with narrow base of support.  We walk out into the hallway, patient needs max verbal cueing but he is quite  impulsive and tries to walk into another patient's room.  He initially refuses to turn around and walk back to his room but with max encouragement and assist from a visitor in the hallway he agrees to walk back.  Once in his room he insists in walking over to look out the window.  Again patient is quite impulsive and this significantly increases his risk of falls coupled with very narrow base of support with walking, sometimes scissoring gait.  PT assists patient back to bed; he needs min A for his legs and patient is left in bed with bed alarm on, call bell in reach and nursing notified of mobility status.  Patient will benefit from continued skilled therapy services during the remainder of his hospital stay and at the next recommended venue of care to address deficits and promote return to optimal function.           Recommendations for follow up therapy are one component of a multi-disciplinary discharge planning process, led by the attending physician.  Recommendations may be updated based on patient status, additional functional criteria and insurance authorization.  Follow Up Recommendations Skilled nursing-short term rehab (<3 hours/day) Can patient physically be transported by private vehicle: Yes    Assistance Recommended at Discharge Frequent or constant Supervision/Assistance  Patient can return home with the following  A lot of help with walking and/or transfers;A lot of help  with bathing/dressing/bathroom;Direct supervision/assist for medications management;Help with stairs or ramp for entrance;Assist for transportation    Equipment Recommendations None recommended by PT  Recommendations for Other Services       Functional Status Assessment Patient has had a recent decline in their functional status and demonstrates the ability to make significant improvements in function in a reasonable and predictable amount of time.     Precautions / Restrictions Precautions Precautions:  Fall Restrictions Weight Bearing Restrictions: No      Mobility  Bed Mobility Overal bed mobility: Needs Assistance Bed Mobility: Supine to Sit, Sit to Supine     Supine to sit: Min assist Sit to supine: Min assist   General bed mobility comments: min A to pull up from supine to sitting and min A to return legs to the bed today; max cues and encouragement Patient Response: Impulsive  Transfers Overall transfer level: Needs assistance Equipment used: Rolling walker (2 wheels) Transfers: Sit to/from Stand Sit to Stand: Min assist           General transfer comment: sit to stand with min A for balance; patient tends to stand with feet close together; impulsive    Ambulation/Gait Ambulation/Gait assistance: Min assist Gait Distance (Feet): 30 Feet Assistive device: Rolling walker (2 wheels) Gait Pattern/deviations: Narrow base of support, Scissoring       General Gait Details: stands and walks with feet close together; occassionally scissoring; needs max cues for navigation.  He tries to walk into another patient's room and then refused intially to return to his room  Stairs            Wheelchair Mobility    Modified Rankin (Stroke Patients Only)       Balance Overall balance assessment: Needs assistance Sitting-balance support: Bilateral upper extremity supported, Feet unsupported Sitting balance-Leahy Scale: Fair Sitting balance - Comments: Requiring UE support to remain upright Postural control: Left lateral lean Standing balance support: Bilateral upper extremity supported, Reliant on assistive device for balance, During functional activity Standing balance-Leahy Scale: Fair Standing balance comment: fair standing balance with RW                             Pertinent Vitals/Pain Pain Assessment Pain Assessment: No/denies pain    Home Living Family/patient expects to be discharged to:: Private residence Living Arrangements:  Spouse/significant other Available Help at Discharge: Family;Available 24 hours/day Type of Home: House Home Access: Stairs to enter Entrance Stairs-Rails: None Entrance Stairs-Number of Steps: 1 step\   Home Layout: One level Home Equipment: Conservation officer, nature (2 wheels);Cane - single point;Shower seat;Toilet riser Additional Comments: patient is quite impulsive; seems to understand commands but does not always choose to follow them    Prior Function Prior Level of Function : Needs assist  Cognitive Assist : Mobility (cognitive) Mobility (Cognitive): Intermittent cues   Physical Assist : Mobility (physical) Mobility (physical): Gait;Transfers;Bed mobility   Mobility Comments: uses RW at times ADLs Comments: per wife, sometimes needs assist with ADL's     Hand Dominance   Dominant Hand: Right    Extremity/Trunk Assessment   Upper Extremity Assessment Upper Extremity Assessment: Defer to OT evaluation    Lower Extremity Assessment Lower Extremity Assessment: Generalized weakness       Communication   Communication: Receptive difficulties;Expressive difficulties;HOH  Cognition Arousal/Alertness: Awake/alert Behavior During Therapy: Anxious, Agitated, Impulsive Overall Cognitive Status: History of cognitive impairments - at baseline  General Comments: patient seems to understand commands but sometimes chooses not to follow; if he decides he wants to walk a different direction he will; once we walked into the hallway; he refused to walk back to the room initially; needed max encouragement; occassionally agitated with therapist requests.        General Comments General comments (skin integrity, edema, etc.): patient impulsive; tends to stand with feet close together    Exercises     Assessment/Plan    PT Assessment Patient needs continued PT services  PT Problem List Decreased strength;Decreased coordination;Decreased  cognition;Decreased activity tolerance;Decreased knowledge of use of DME;Decreased balance;Decreased safety awareness;Decreased mobility;Decreased knowledge of precautions       PT Treatment Interventions Balance training;DME instruction;Gait training;Neuromuscular re-education;Functional mobility training;Cognitive remediation;Patient/family education;Therapeutic activities;Therapeutic exercise    PT Goals (Current goals can be found in the Care Plan section)  Acute Rehab PT Goals PT Goal Formulation: Patient unable to participate in goal setting Time For Goal Achievement: 03/12/22 Potential to Achieve Goals: Fair    Frequency Min 2X/week     Co-evaluation               AM-PAC PT "6 Clicks" Mobility  Outcome Measure Help needed turning from your back to your side while in a flat bed without using bedrails?: A Little Help needed moving from lying on your back to sitting on the side of a flat bed without using bedrails?: A Little Help needed moving to and from a bed to a chair (including a wheelchair)?: A Lot Help needed standing up from a chair using your arms (e.g., wheelchair or bedside chair)?: A Lot Help needed to walk in hospital room?: A Lot Help needed climbing 3-5 steps with a railing? : A Lot 6 Click Score: 14    End of Session   Activity Tolerance: Patient tolerated treatment well;Other (comment) (patient impulsive throughout treatment) Patient left: with call bell/phone within reach;with bed alarm set;in bed Nurse Communication: Mobility status PT Visit Diagnosis: Unsteadiness on feet (R26.81);Other abnormalities of gait and mobility (R26.89);Repeated falls (R29.6);Muscle weakness (generalized) (M62.81);History of falling (Z91.81)    Time: 9450-3888 PT Time Calculation (min) (ACUTE ONLY): 22 min   Charges:             10:06 AM, 02/26/22 Shirleyann Montero Small Webb Weed MPT [facility]  [phone]

## 2022-02-26 NOTE — Progress Notes (Signed)
Patient has been more alert since 0200, more at baseline.  Patient has been more responsive and able to have a conversation. Patient is still confused and needs to be reoriented.  Spouse called at 0200 to reorient patient.  Patient has also been very adament about walking and is able to ambulate with walker, unthough unbalanced. Patient seems to lean toward right side with walking and feet become tangled. Patient is still a high fall risk, as he shows poor insight with ambulation and safety precautions.  At approximately 0530 patient awakened confused and combative with staff.  He was looking for his wife and was difficult to reorient.  Spouse Walter [name] had to be called again for reorientation given and MD contacted to obtain medication for agitation.  Medication given and patient in bed with staff.  Patient has required much supervision this shift, due to his confusion and high fall risk. Vitals have been stable.

## 2022-02-27 ENCOUNTER — Inpatient Hospital Stay (HOSPITAL_COMMUNITY): Payer: Medicare HMO

## 2022-02-27 DIAGNOSIS — D72829 Elevated white blood cell count, unspecified: Secondary | ICD-10-CM | POA: Diagnosis not present

## 2022-02-27 DIAGNOSIS — G9341 Metabolic encephalopathy: Secondary | ICD-10-CM | POA: Diagnosis not present

## 2022-02-27 DIAGNOSIS — F01511 Vascular dementia, unspecified severity, with agitation: Secondary | ICD-10-CM | POA: Diagnosis not present

## 2022-02-27 LAB — RPR: RPR Ser Ql: NONREACTIVE

## 2022-02-27 MED ORDER — HYDROCHLOROTHIAZIDE 12.5 MG PO TABS
12.5000 mg | ORAL_TABLET | Freq: Every day | ORAL | Status: DC
  Administered 2022-02-27 – 2022-02-28 (×2): 12.5 mg via ORAL
  Filled 2022-02-27 (×2): qty 1

## 2022-02-27 MED ORDER — LOSARTAN POTASSIUM 50 MG PO TABS
25.0000 mg | ORAL_TABLET | Freq: Every day | ORAL | Status: DC
  Administered 2022-02-27 – 2022-03-02 (×4): 25 mg via ORAL
  Filled 2022-02-27 (×4): qty 1

## 2022-02-27 MED ORDER — ENOXAPARIN SODIUM 40 MG/0.4ML IJ SOSY
40.0000 mg | PREFILLED_SYRINGE | INTRAMUSCULAR | Status: DC
  Administered 2022-02-28 – 2022-03-02 (×3): 40 mg via SUBCUTANEOUS
  Filled 2022-02-27 (×3): qty 0.4

## 2022-02-27 MED ORDER — IPRATROPIUM-ALBUTEROL 0.5-2.5 (3) MG/3ML IN SOLN
3.0000 mL | Freq: Three times a day (TID) | RESPIRATORY_TRACT | Status: DC
  Administered 2022-02-27: 3 mL via RESPIRATORY_TRACT
  Filled 2022-02-27: qty 3

## 2022-02-27 MED ORDER — GUAIFENESIN ER 600 MG PO TB12
600.0000 mg | ORAL_TABLET | Freq: Two times a day (BID) | ORAL | Status: AC
  Administered 2022-02-27 – 2022-03-01 (×6): 600 mg via ORAL
  Filled 2022-02-27 (×6): qty 1

## 2022-02-27 MED ORDER — DEXTROMETHORPHAN POLISTIREX ER 30 MG/5ML PO SUER
30.0000 mg | Freq: Two times a day (BID) | ORAL | Status: AC
  Administered 2022-02-27 – 2022-03-01 (×6): 30 mg via ORAL
  Filled 2022-02-27 (×6): qty 5

## 2022-02-27 MED ORDER — HYDRALAZINE HCL 20 MG/ML IJ SOLN: 10.0000 mg | INTRAMUSCULAR | Status: DC | PRN

## 2022-02-27 MED ORDER — AMLODIPINE BESYLATE 5 MG PO TABS
5.0000 mg | ORAL_TABLET | Freq: Every day | ORAL | Status: DC
  Administered 2022-02-27 – 2022-03-01 (×3): 5 mg via ORAL
  Filled 2022-02-27 (×3): qty 1

## 2022-02-27 NOTE — Progress Notes (Signed)
Patient rested well. Patient had no aggressive outburst or combativeness with staff.  Patient had no prn medication at night, only scheduled medication. Vitals have been stable and patient urinated x 1.

## 2022-02-27 NOTE — Progress Notes (Signed)
   02/27/22 2335  Assess: MEWS Score  Level of Consciousness Alert  Assess: MEWS Score  MEWS Temp 0  MEWS Systolic 0  MEWS Pulse 2  MEWS RR 0  MEWS LOC 0  MEWS Score 2  MEWS Score Color Yellow  Assess: if the MEWS score is Yellow or Red  Were vital signs taken at a resting state? Yes  Focused Assessment Change from prior assessment (see assessment flowsheet)  Does the patient meet 2 or more of the SIRS criteria? No  Does the patient have a confirmed or suspected source of infection? No  MEWS guidelines implemented *See Row Information* Yes  Treat  Pain Scale 0-10  Pain Score 0  Take Vital Signs  Increase Vital Sign Frequency  Yellow: Q 2hr X 2 then Q 4hr X 2, if remains yellow, continue Q 4hrs  Escalate  MEWS: Escalate Yellow: discuss with charge nurse/RN and consider discussing with provider and RRT  Notify: Charge Nurse/RN  Name of Charge Nurse/RN Notified Grace Isaac, RN  Date Charge Nurse/RN Notified 02/27/22  Time Charge Nurse/RN Notified 2340

## 2022-02-27 NOTE — Progress Notes (Signed)
PROGRESS NOTE   Walter [name]  BHA:[id number] DOB: [birth year] DOA: 02/25/2022 PCP: Dettinger, Fransisca Kaufmann, MD   Chief Complaint  Patient presents with   Altered Mental Status   Level of care: Med-Surg  Brief Admission History:   71 y.o. male with medical history significant of TIA, CVA, C. difficile colitis, COPD, coronary artery disease, hypertension, hyperlipidemia, and more presents to the ED with a chief complaint of altered mental status.  Patient is not able to provide any history.  Wife had reported to the ED provider that patient went to bed around midnight and was at baseline at that time.  A couple of hours later he was difficult to arouse, and was not speaking.  She called EMS for altered mental status.  In the ER at first he was aphasic.  He was also hypersomnolent.  Throughout his stay in the ER he has become more alert, the only worried he will say is "alright."  He is following commands and can move all 4 extremities.  Code stroke was done in the ER and he was determined not to be a candidate for acute intervention.  They recommended admission for stroke and metabolic work-up.   It is documented in previous charts that patient is DNR.   Assessment and Plan: * Question of acute CVA (cerebral vascular accident) - Pt was unable to tolerate MRI and repeat CT was done 24 hours after first CT scan.  - no evidence of acute CVA found - continue aspirin plavix that he was on prior to admission  --PT recommending SNF and after discussing with wife she would like to pursue SNF placement; --TOC consulted for SNF placement   AMS (altered mental status) -improving to baseline; started him on seroquel 25 mg QHS  Leukocytosis - UA is not indicative of UTI - No respiratory symptoms observed, patient maintaining oxygen sats on room air No other overt signs of infection found   Coronary artery disease - Continue aspirin, Plavix, statin - Monitor on telemetry  Acute metabolic  encephalopathy - he seems to be getting back to his baseline - started seroquel 25 mg QHS on 11/25 - 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  -- follow up Dementia lab panel (no significant findings) - MRI unable to be completed as patient could not tolerate test - Continue to monitor  Vascular dementia ([place]) - Pt has intermittent confusion; monitor closely  -- lipids are uncontrolled, continue atorvastatin 40 mg daily (suspect wasn't taking it at home) -- follow up dementia lab panel (studies WNL)  Hyperlipidemia, mixed - Continue statin  HTN (hypertension) - working to normalize BP now that CVA has been ruled out with repeated CT scanning   DVT prophylaxis: [place] heparin Code Status: DNR  Family Communication: wife bedside update  Disposition: anticipating STR SNF placement     Consultants:   Procedures:   Antimicrobials:    Subjective: Pt having more chest congestion today.    Objective: Vitals:   02/26/22 1415 02/26/22 2053 02/27/22 0530 02/27/22 1231  BP: (!) 170/71 (!) 162/93 (!) 145/85 (!) 161/90  Pulse: (!) 109 (!) 104 96 100  Resp: '20 20 20 18  '$ Temp: 97.9 F (36.6 C) 98.1 F (  36.7 C) 97.9 F (36.6 C) 98.1 F (36.7 C)  TempSrc:    Oral  SpO2: 95% 92% 95% 94%  Height:        Intake/Output Summary (Last 24 hours) at 02/27/2022 1653 Last data filed at 02/26/2022 2000 Gross per 24 hour  Intake 120 ml  Output 200 ml  Net -80 ml   There were no vitals filed for this visit. Examination:  General exam: Appears calm and comfortable  Respiratory system: Clear to auscultation. Respiratory effort normal. Cardiovascular system: normal S1 & S2 heard. No JVD,  murmurs, rubs, gallops or clicks. No pedal edema. Gastrointestinal system: Abdomen is nondistended, soft and nontender. No organomegaly or masses felt. Normal bowel sounds heard. Central nervous system: Alert and confused with dementia. No focal neurological deficits. Extremities: Symmetric 5 x 5 power. Skin: No rashes, lesions or ulcers. Psychiatry: Judgement and insight appear poor. Mood & affect agitated.   Data Reviewed: I have personally reviewed following labs and imaging studies  CBC: Recent Labs  Lab 02/25/22 0307 02/26/22 0401  WBC 11.2* 11.7*  NEUTROABS 7.7  --   HGB 16.2 16.7  HCT 49.1 51.1  MCV 89.4 91.1  PLT 300 778    Basic Metabolic Panel: Recent Labs  Lab 02/25/22 0307 02/26/22 0401  NA 134* 138  K 3.8 4.1  CL 107 105  CO2 20* 20*  GLUCOSE 122* 110*  BUN 13 13  CREATININE 1.16 1.04  CALCIUM 8.7* 9.4    CBG: Recent Labs  Lab 02/25/22 0316  GLUCAP 139*    No results found for this or any previous visit (from the past 240 hour(s)).   Radiology Studies: CT HEAD WO CONTRAST (5MM)  Result Date: 02/26/2022 CLINICAL DATA:  Altered mental status, stroke follow-up.  Seizure. EXAM: CT HEAD WITHOUT CONTRAST TECHNIQUE: Contiguous axial images were obtained from the base of the skull through the vertex without intravenous contrast. RADIATION DOSE REDUCTION: This exam was performed according to the departmental dose-optimization program which includes automated exposure control, adjustment of the mA and/or kV according to patient size and/or use of iterative reconstruction technique. COMPARISON:  CT head 1 day prior FINDINGS: Brain: There is no acute intracranial hemorrhage, extra-axial fluid collection, or acute infarct Background parenchymal volume loss with prominence of the ventricular system and extra-axial CSF spaces is stable. Remote infarcts in the bilateral basal ganglia and background advanced chronic small-vessel ischemic change are stable. There is no  mass lesion.  There is no mass effect or midline shift. Vascular: There is calcification of the bilateral carotid siphons and vertebral arteries. Skull: Normal. Negative for fracture or focal lesion. Sinuses/Orbits: There is mild mucosal thickening in the ethmoid air cells. Bilateral lens implants are in place. The globes and orbits are otherwise unremarkable. Other: None. IMPRESSION: Stable noncontrast head CT with no acute intracranial pathology. Electronically Signed   By: Valetta Mole M.D.   On: 02/26/2022 12:05    Scheduled Meds:  aspirin EC  81 mg Oral Daily   atorvastatin  40 mg Oral QPM   clopidogrel  75 mg Oral Daily   dextromethorphan  30 mg Oral BID   [START ON 02/28/2022] enoxaparin (LOVENOX) injection  40 mg Subcutaneous Q24H   guaiFENesin  600 mg Oral BID   ipratropium-albuterol  3 mL Nebulization TID   QUEtiapine  25 mg Oral QHS   Continuous Infusions:   LOS: 1 day   Time spent: 35 mins  Khamora Karan Wynetta Emery, MD How to contact the [facility] Attending or  Consulting provider [place] or covering provider during after hours [place], for this patient?  Check the care team in [facility] and look for a) attending/consulting TRH provider listed and b) the [facility] team listed Log into [email or web address] and use [place]'s universal password to access. If you do not have the password, please contact the hospital operator. Locate the [facility] provider you are looking for under [facility] and page to a number that you can be directly reached. If you still have difficulty reaching the provider, please page the [facility] (Director on Call) for the Hospitalists listed on amion for assistance.  02/27/2022, 4:53 PM

## 2022-02-27 NOTE — Assessment & Plan Note (Addendum)
-  improving to baseline; started him on seroquel 25 mg QHS for severe sundowning which seems to have improved; after speaking with nursing facility they prefer he not be on antipsychotics so we discontinued seroquel; the nursing facility plans to try him on depakote sprinkles.

## 2022-02-27 NOTE — TOC Progression Note (Signed)
Transition of Care [facility]) - Progression Note    Patient Details  Name: Walter [name] MRN: [id number] Date of Birth: [birth year]  Transition [facility]) CM/SW [place], [place] Phone Number: 02/27/2022, 10:43 AM  Clinical Narrative:    CSW reached out to [facility] with [facility] regarding pts referral. She will review and provide update when able. TOC to follow.   Expected Discharge Plan: [place] Barriers to Discharge: Continued Medical Work up  Expected Discharge Plan and Services Expected Discharge Plan: [place] In-house Referral: Clinical Social Work     Living arrangements for the past 2 months: Single Family Home                                       Social Determinants of Health (SDOH) Interventions    Readmission Risk Interventions     No data to display

## 2022-02-27 NOTE — Plan of Care (Signed)
  Problem: Ischemic Stroke/TIA Tissue Perfusion: Goal: Complications of ischemic stroke/TIA will be minimized Outcome: Progressing   

## 2022-02-28 DIAGNOSIS — J690 Pneumonitis due to inhalation of food and vomit: Secondary | ICD-10-CM | POA: Clinically undetermined

## 2022-02-28 DIAGNOSIS — G9341 Metabolic encephalopathy: Secondary | ICD-10-CM | POA: Diagnosis not present

## 2022-02-28 DIAGNOSIS — F01511 Vascular dementia, unspecified severity, with agitation: Secondary | ICD-10-CM | POA: Diagnosis not present

## 2022-02-28 DIAGNOSIS — D72829 Elevated white blood cell count, unspecified: Secondary | ICD-10-CM | POA: Diagnosis not present

## 2022-02-28 LAB — CBC WITH DIFFERENTIAL/PLATELET
Abs Immature Granulocytes: 0.07 10*3/uL (ref 0.00–0.07)
Basophils Absolute: 0.1 10*3/uL (ref 0.0–0.1)
Basophils Relative: 1 %
Eosinophils Absolute: 0.2 10*3/uL (ref 0.0–0.5)
Eosinophils Relative: 2 %
HCT: 48.9 % (ref 39.0–52.0)
Hemoglobin: 16.2 g/dL (ref 13.0–17.0)
Immature Granulocytes: 1 %
Lymphocytes Relative: 18 %
Lymphs Abs: 2.1 10*3/uL (ref 0.7–4.0)
MCH: 30.2 pg (ref 26.0–34.0)
MCHC: 33.1 g/dL (ref 30.0–36.0)
MCV: 91.1 fL (ref 80.0–100.0)
Monocytes Absolute: 1.1 10*3/uL — ABNORMAL HIGH (ref 0.1–1.0)
Monocytes Relative: 10 %
Neutro Abs: 7.8 10*3/uL — ABNORMAL HIGH (ref 1.7–7.7)
Neutrophils Relative %: 68 %
Platelets: 289 10*3/uL (ref 150–400)
RBC: 5.37 MIL/uL (ref 4.22–5.81)
RDW: 13.6 % (ref 11.5–15.5)
WBC: 11.3 10*3/uL — ABNORMAL HIGH (ref 4.0–10.5)
nRBC: 0 % (ref 0.0–0.2)

## 2022-02-28 LAB — BASIC METABOLIC PANEL
Anion gap: 8 (ref 5–15)
BUN: 20 mg/dL (ref 8–23)
CO2: 19 mmol/L — ABNORMAL LOW (ref 22–32)
Calcium: 8.9 mg/dL (ref 8.9–10.3)
Chloride: 111 mmol/L (ref 98–111)
Creatinine, Ser: 1.45 mg/dL — ABNORMAL HIGH (ref 0.61–1.24)
GFR, Estimated: 51 mL/min — ABNORMAL LOW (ref >60)
Glucose, Bld: 120 mg/dL — ABNORMAL HIGH (ref 70–99)
Potassium: 3.6 mmol/L (ref 3.5–5.1)
Sodium: 138 mmol/L (ref 135–145)

## 2022-02-28 LAB — HEMOGLOBIN A1C
Hgb A1c MFr Bld: 5.8 % — ABNORMAL HIGH (ref 4.8–5.6)
Mean Plasma Glucose: 120 mg/dL

## 2022-02-28 LAB — VITAMIN B1: Vitamin B1 (Thiamine): 140 nmol/L (ref 66.5–200.0)

## 2022-02-28 MED ORDER — IPRATROPIUM-ALBUTEROL 0.5-2.5 (3) MG/3ML IN SOLN
3.0000 mL | Freq: Two times a day (BID) | RESPIRATORY_TRACT | Status: DC
  Administered 2022-02-28 – 2022-03-02 (×4): 3 mL via RESPIRATORY_TRACT
  Filled 2022-02-28 (×4): qty 3

## 2022-02-28 MED ORDER — AMOXICILLIN-POT CLAVULANATE 875-125 MG PO TABS
1.0000 | ORAL_TABLET | Freq: Two times a day (BID) | ORAL | Status: DC
  Administered 2022-02-28 – 2022-03-02 (×5): 1 via ORAL
  Filled 2022-02-28 (×5): qty 1

## 2022-02-28 MED ORDER — IPRATROPIUM-ALBUTEROL 0.5-2.5 (3) MG/3ML IN SOLN
3.0000 mL | Freq: Three times a day (TID) | RESPIRATORY_TRACT | Status: DC
  Administered 2022-02-28: 3 mL via RESPIRATORY_TRACT
  Filled 2022-02-28: qty 3

## 2022-02-28 MED ORDER — TAMSULOSIN HCL 0.4 MG PO CAPS
0.4000 mg | ORAL_CAPSULE | Freq: Every day | ORAL | Status: DC
  Administered 2022-02-28 – 2022-03-02 (×3): 0.4 mg via ORAL
  Filled 2022-02-28 (×3): qty 1

## 2022-02-28 MED ORDER — SACCHAROMYCES BOULARDII 250 MG PO CAPS
250.0000 mg | ORAL_CAPSULE | Freq: Two times a day (BID) | ORAL | Status: DC
  Administered 2022-02-28 – 2022-03-02 (×5): 250 mg via ORAL
  Filled 2022-02-28 (×5): qty 1

## 2022-02-28 NOTE — TOC Progression Note (Signed)
Transition of Care [facility]) - Progression Note    Patient Details  Name: Walter [name] MRN: [id number] Date of Birth: 03/22/1951  Transition of [facility]) CM/SW Contact  Shade Flood, LCSW Phone Number: 02/28/2022, 12:40 PM  Clinical Narrative:     TOC following. MD anticipating pt will be medically stable for dc tomorrow.   TOC spoke with Melissa at [facility] to follow up on SNF referral as this is pt/family preference. Per Lenna Sciara, she would like to come and evaluate pt in person tomorrow after one more day of Seroquel treatment.   SNF insurance auth pending. TOC will follow.  Expected Discharge Plan: [place] Barriers to Discharge: Continued Medical Work up  Expected Discharge Plan and Services Expected Discharge Plan: [place] In-house Referral: Clinical Social Work   Post Acute Care Choice: [place] Living arrangements for the past 2 months: Single Family Home                                       Social Determinants of Health (SDOH) Interventions    Readmission Risk Interventions     No data to display

## 2022-02-28 NOTE — Progress Notes (Signed)
PROGRESS NOTE   Walter [name]  FGB:[id number] DOB: [birth year] DOA: 02/25/2022 PCP: Dettinger, Fransisca Kaufmann, MD   Chief Complaint  Patient presents with   Altered Mental Status   Level of care: Med-Surg  Brief Admission History:   71 y.o. male with medical history significant of TIA, CVA, C. difficile colitis, COPD, coronary artery disease, hypertension, hyperlipidemia, and more presents to the ED with a chief complaint of altered mental status.  Patient is not able to provide any history.  Wife had reported to the ED provider that patient went to bed around midnight and was at baseline at that time.  A couple of hours later he was difficult to arouse, and was not speaking.  She called EMS for altered mental status.  In the ER at first he was aphasic.  He was also hypersomnolent.  Throughout his stay in the ER he has become more alert, the only worried he will say is "alright."  He is following commands and can move all 4 extremities.  Code stroke was done in the ER and he was determined not to be a candidate for acute intervention.  They recommended admission for stroke and metabolic work-up.   It is documented in previous charts that patient is DNR.   Assessment and Plan: * Question of acute CVA (cerebral vascular accident) - Pt was unable to tolerate MRI and repeat CT was done 24 hours after first CT scan.  - no evidence of acute CVA found - continue aspirin plavix that he was on prior to admission  --PT recommending SNF and after discussing with wife she would like to pursue SNF placement; --TOC consulted for SNF placement   Aspiration pneumonia ([place]) - He seems to have aspirated on 02/27/2022 and now has findings of pneumonia on chest x-ray, we are starting oral Augmentin 875 mg BID -Continue aspiration precautions and dysphagia diet ordered by speech-language pathologist  AMS (altered mental status) -improving to baseline; started him on seroquel 25 mg QHS for severe sundowning which  seems to have improved  Leukocytosis - UA is not indicative of UTI - No respiratory symptoms observed, patient maintaining oxygen sats on room air - He appears to have aspirated on 11/26, he now has evidence of pneumonia on CXR and we are starting antibiotics   Coronary artery disease - Continue aspirin, Plavix, statin - Monitor on telemetry  Acute metabolic encephalopathy - he seems to be getting back to his baseline - started seroquel 25 mg QHS on 11/25 - 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  -- follow up Dementia lab panel (no significant findings) - MRI unable to be completed as patient could not tolerate test - Continue to monitor  Vascular dementia ([place]) - Pt has intermittent confusion; monitor closely  -- lipids are uncontrolled, continue atorvastatin 40 mg daily (suspect wasn't taking it at home) -- follow up dementia lab panel (studies WNL)  Hyperlipidemia, mixed - Continue statin  HTN (hypertension) - working to normalize BP now that CVA has been ruled out with repeated CT scanning   DVT prophylaxis: [place] heparin Code Status: DNR  Family Communication: wife bedside update  Disposition: anticipating STR SNF placement    Consultants:   Procedures:  Antimicrobials:    Subjective: Pt breathing better, still with baseline confusion.     Objective: Vitals:   02/27/22 2255 02/28/22 0046 02/28/22 0524 02/28/22 0736  BP: 95/63 96/71 117/71   Pulse: (!) 107 (!) 107 89   Resp: 18 (!) 24 (!) 22   Temp: 98.4 F (36.9 C) 97.7 F (36.5 C) 97.7 F (36.5 C)   TempSrc: Oral     SpO2: 96% 95% 94% 93%  Height:        Intake/Output Summary (Last 24 hours) at  02/28/2022 1154 Last data filed at 02/27/2022 1900 Gross per 24 hour  Intake 120 ml  Output --  Net 120 ml   There were no vitals filed for this visit. Examination:  General exam: Appears calm and comfortable  Respiratory system: Clear to auscultation. Respiratory effort normal. Cardiovascular system: normal S1 & S2 heard. No JVD, murmurs, rubs, gallops or clicks. No pedal edema. Gastrointestinal system: Abdomen is nondistended, soft and nontender. No organomegaly or masses felt. Normal bowel sounds heard. Central nervous system: Alert and confused with dementia. No focal neurological deficits. Extremities: Symmetric 5 x 5 power. Skin: No rashes, lesions or ulcers. Psychiatry: Judgement and insight appear poor. Mood & affect agitated.   Data Reviewed: I have personally reviewed following labs and imaging studies  CBC: Recent Labs  Lab 02/25/22 0307 02/26/22 0401 02/28/22 0357  WBC 11.2* 11.7* 11.3*  NEUTROABS 7.7  --  7.8*  HGB 16.2 16.7 16.2  HCT 49.1 51.1 48.9  MCV 89.4 91.1 91.1  PLT 300 299 607    Basic Metabolic Panel: Recent Labs  Lab 02/25/22 0307 02/26/22 0401 02/28/22 0357  NA 134* 138 138  K 3.8 4.1 3.6  CL 107 105 111  CO2 20* 20* 19*  GLUCOSE 122* 110* 120*  BUN '13 13 20  '$ CREATININE 1.16 1.04 1.45*  CALCIUM 8.7* 9.4 8.9    CBG: Recent Labs  Lab 02/25/22 0316  GLUCAP 139*    No results found for this or any previous visit (from the past 240 hour(s)).   Radiology Studies: DG CHEST PORT 1 VIEW  Result Date: 02/27/2022 CLINICAL DATA:  Congestion EXAM: PORTABLE CHEST 1 VIEW COMPARISON:  None Available. FINDINGS: Mild vascular congestion. There is density at the left base which could represent pneumonia or layering effusion. No pleural effusion on the right. No pneumothorax. Unremarkable cardiac silhouette. Calcified aorta. IMPRESSION: Left base consolidation and/or effusion.  Mild vascular congestion. Electronically Signed   By: Sammie Bench  M.D.   On: 02/27/2022 14:07    Scheduled Meds:  amLODipine  5 mg Oral Daily   amoxicillin-clavulanate  1 tablet Oral Q12H   aspirin EC  81 mg Oral Daily   atorvastatin  40 mg Oral QPM   clopidogrel  75 mg Oral Daily   dextromethorphan  30 mg Oral BID   enoxaparin (LOVENOX) injection  40 mg Subcutaneous Q24H   guaiFENesin  600 mg Oral BID   ipratropium-albuterol  3 mL Nebulization BID   losartan  25 mg Oral Daily   QUEtiapine  25 mg Oral QHS   saccharomyces boulardii  250 mg Oral BID   Continuous Infusions:   LOS: 2 days   Time spent: 35 mins  Sarah Zerby Wynetta Emery, MD How to contact the [facility] Attending or Consulting provider [place] or covering provider during after hours [place], for this patient?  Check the care team in [facility] and look for a) attending/consulting [place] provider listed and b) the  [place] team listed Log into [email or web address] and use [place]'s universal password to access. If you do not have the password, please contact the hospital operator. Locate the [facility] provider you are looking for under [facility] and page to a number that you can be directly reached. If you still have difficulty reaching the provider, please page the [facility] (Director on Call) for the Hospitalists listed on amion for assistance.  02/28/2022, 11:54 AM

## 2022-02-28 NOTE — Care Management Important Message (Signed)
Important Message  Patient Details  Name: Walter [name] MRN: [id number] Date of Birth: Dec 19, 1950   Medicare Important Message Given:  Yes     Tommy Medal 02/28/2022, 11:57 AM

## 2022-02-28 NOTE — Progress Notes (Signed)
Speech Language Pathology Treatment: Dysphagia  Patient Details Name: Walter [name] MRN: [id number] DOB: 05/10/1950 Today's Date: 02/28/2022 Time: 2119-4174 SLP Time Calculation (min) (ACUTE ONLY): 23 min  Assessment / Plan / Recommendation Clinical Impression  Pt seen for ongoing dysphagia intervention following BSE completed on Friday. Pt's wife reports that Pt occasionally coughs when eating and this happened yesterday and chest x-ray completed. Pt is a mouth breather and has difficulty coordinating chewing/swallowing/breathing, which increases his risk for aspiration. Pt observed with thin liquids via straw sips and mech soft textures. Pt with prolonged mastication and oral transit with solids, however no coughing, residue, or reports of globus. Pt became tearful while chewing the meat when wife was discussing previous strokes. Pt is receiving mechanical soft diet and this continues to be appropriate if Pt takes small bites and chews thoroughly. Textures can be downgraded to ground meats if necessary, however would recommend current diet if Pt is supervised for all meals and encourage sitting upright in a chair (out of bed, as able). Wife in agreement with plan. Continue diet of D3/mech soft and thin liquids via cup/straw sips, small bites/sips, chew thoroughly, and supervision with all PO intake.    HPI HPI: Walter [name] is a 71 y.o. male with medical history significant of TIA, CVA, C. difficile colitis, COPD, coronary artery disease, hypertension, hyperlipidemia, and more presents to the ED with a chief complaint of altered mental status.  Patient is not able to provide any history.  Wife had reported to the ED provider that patient went to bed around midnight and was at baseline at that time.  A couple of hours later he was difficult to arouse, and was not speaking.  She called EMS for altered mental status.  In the ER at first he was aphasic.  He was also hypersomnolent.  Throughout his stay in  the ER he has become more alert, the only worried he will say is "alright."  He is following commands and can move all 4 extremities.  Code stroke was done in the ER and he was determined not to be a candidate for acute intervention.  They recommended admission for stroke and metabolic work-up.      SLP Plan  Continue with current plan of care      Recommendations for follow up therapy are one component of a multi-disciplinary discharge planning process, led by the attending physician.  Recommendations may be updated based on patient status, additional functional criteria and insurance authorization.    Recommendations  Diet recommendations: Dysphagia 3 (mechanical soft);Thin liquid Liquids provided via: Straw;Cup Medication Administration: Whole meds with liquid Supervision: Staff to assist with self feeding;Full supervision/cueing for compensatory strategies Compensations: Minimize environmental distractions;Slow rate;Small sips/bites Postural Changes and/or Swallow Maneuvers: Out of bed for meals;Seated upright 90 degrees;Upright 30-60 min after meal                Oral Care Recommendations: Oral care BID;Staff/trained caregiver to provide oral care Follow Up Recommendations: Follow physician's recommendations for discharge plan and follow up therapies Assistance recommended at discharge: Frequent or constant Supervision/Assistance SLP Visit Diagnosis: Dysphagia, unspecified (R13.10) Plan: Continue with current plan of care          Thank you,  Genene Churn, [place]  [place]  02/28/2022, 1:59 PM

## 2022-02-28 NOTE — Progress Notes (Signed)
Pt has remained a yellow MEWS through the night because of tachycardia and tachypnea. Pt is alert and oriented x 2. Disoriented to situation and time. Pt calm and cooperative. Slept through the night.

## 2022-02-28 NOTE — Assessment & Plan Note (Addendum)
-   He seems to have aspirated on 02/27/2022 and now has findings of pneumonia on chest x-ray, we are starting oral Augmentin 875 mg BID -Continue aspiration precautions and dysphagia diet ordered by speech-language pathologist

## 2022-03-01 DIAGNOSIS — D72829 Elevated white blood cell count, unspecified: Secondary | ICD-10-CM | POA: Diagnosis not present

## 2022-03-01 DIAGNOSIS — R55 Syncope and collapse: Secondary | ICD-10-CM | POA: Diagnosis not present

## 2022-03-01 DIAGNOSIS — G9341 Metabolic encephalopathy: Secondary | ICD-10-CM | POA: Diagnosis not present

## 2022-03-01 DIAGNOSIS — F01511 Vascular dementia, unspecified severity, with agitation: Secondary | ICD-10-CM | POA: Diagnosis not present

## 2022-03-01 LAB — GLUCOSE, CAPILLARY: Glucose-Capillary: 155 mg/dL — ABNORMAL HIGH (ref 70–99)

## 2022-03-01 NOTE — Progress Notes (Signed)
After 500 bolus bp rose to 130/80.  Placed on tele, family at bedside.

## 2022-03-01 NOTE — Progress Notes (Signed)
Pt alert and oriented x 2. SBP elevated. NIH stroke scale score of 4. +1 edema in bilateral lower extremities. Pt ambulated in room with 1 assist.

## 2022-03-01 NOTE — Progress Notes (Signed)
Occupational Therapy Treatment Patient Details Name: Walter [name] MRN: [id number] DOB: Feb 13, 1951 Today's Date: 03/01/2022   History of present illness Walter [name] is a 71 y.o. male with medical history significant of TIA, CVA, C. difficile colitis, COPD, coronary artery disease, hypertension, hyperlipidemia, and more presents to the ED with a chief complaint of altered mental status.  Patient is not able to provide any history.  Wife had reported to the ED provider that patient went to bed around midnight and was at baseline at that time.  A couple of hours later he was difficult to arouse, and was not speaking.  She called EMS for altered mental status.  In the ER at first he was aphasic.  He was also hypersomnolent.  Throughout his stay in the ER he has become more alert, the only worried he will say is "alright."  He is following commands and can move all 4 extremities.  Code stroke was done in the ER and he was determined not to be a candidate for acute intervention.  They recommended admission for stroke and metabolic work-up.   OT comments  Pt agreeable to OT treatment. Pt oriented and followed commands well today. Good B UE strength for shoulder flexion via MMT today. Pt required mild extended time for bed mobility and moderate assist for step pivot transfer to chair with RW. Pt able to doff and don socks seated in the recliner. Pt still at level of set up assist for grooming seated in chair. Overall pt seems much improved from most recent OT acute evaluation. Pt left in chair with chair alarm set and call bell within reach. Pt will benefit from continued OT in the hospital and recommended venue below to increase strength, balance, and endurance for safe ADL's.      Recommendations for follow up therapy are one component of a multi-disciplinary discharge planning process, led by the attending physician.  Recommendations may be updated based on patient status, additional functional criteria and  insurance authorization.    Follow Up Recommendations  Skilled nursing-short term rehab (<3 hours/day)     Assistance Recommended at Discharge Frequent or constant Supervision/Assistance  Patient can return home with the following  Two people to help with walking and/or transfers;A lot of help with bathing/dressing/bathroom;Assistance with cooking/housework;Direct supervision/assist for medications management;Direct supervision/assist for financial management;Help with stairs or ramp for entrance;Assist for transportation   Equipment Recommendations  None recommended by OT    Recommendations for Other Services      Precautions / Restrictions Precautions Precautions: Fall Restrictions Weight Bearing Restrictions: No       Mobility Bed Mobility Overal bed mobility: Needs Assistance Bed Mobility: Supine to Sit     Supine to sit: Supervision, Min guard     General bed mobility comments: Mild labored movement.    Transfers Overall transfer level: Needs assistance Equipment used: Rolling walker (2 wheels) Transfers: Sit to/from Stand, Bed to chair/wheelchair/BSC Sit to Stand: Min assist     Step pivot transfers: Mod assist     General transfer comment: Min A to boost from EOB with verbal and tactile cuing to pivot to chair with RW.     Balance Overall balance assessment: Needs assistance Sitting-balance support: Bilateral upper extremity supported, Feet unsupported Sitting balance-Leahy Scale: Good Sitting balance - Comments: seated EOB   Standing balance support: Bilateral upper extremity supported, Reliant on assistive device for balance, During functional activity Standing balance-Leahy Scale: Fair Standing balance comment: fair with RW  ADL either performed or assessed with clinical judgement   ADL Overall ADL's : Needs assistance/impaired     Grooming: Wash/dry face;Sitting;Set up Grooming Details (indicate cue type  and reason): Pt able to was face seated in chair with set up assist.             Lower Body Dressing: Modified independent;Sitting/lateral leans Lower Body Dressing Details (indicate cue type and reason): Mild extended time to doff and don socks seated in recliner. Toilet Transfer: Moderate assistance;Stand-pivot;Rolling walker (2 wheels) Toilet Transfer Details (indicate cue type and reason): Simulated via EOB to chair transfer with RW.                  Cognition Arousal/Alertness: Awake/alert Behavior During Therapy: WFL for tasks assessed/performed Overall Cognitive Status: No family/caregiver present to determine baseline cognitive functioning                                 General Comments: Pt oriented to place, year, and month. Followed commands well.                   Pertinent Vitals/ Pain       Pain Assessment Pain Assessment: No/denies pain                                                          Frequency  Min 1X/week        Progress Toward Goals  OT Goals(current goals can now be found in the care plan section)  Progress towards OT goals: Progressing toward goals  Acute Rehab OT Goals Patient Stated Goal: To go home OT Goal Formulation: With patient/family Time For Goal Achievement: 03/11/22 Potential to Achieve Goals: Good ADL Goals Pt Will Perform Grooming: with modified independence;sitting Pt Will Perform Lower Body Bathing: with min assist;sitting/lateral leans;sit to/from stand Pt Will Perform Lower Body Dressing: with min assist;sitting/lateral leans;sit to/from stand Pt Will Transfer to Toilet: with min assist;ambulating Pt Will Perform Toileting - Clothing Manipulation and hygiene: with min assist;sitting/lateral leans;sit to/from stand  Plan Discharge plan remains appropriate                                    End of Session Equipment Utilized During Treatment: Rolling walker  (2 wheels)  OT Visit Diagnosis: Unsteadiness on feet (R26.81);Other abnormalities of gait and mobility (R26.89);Muscle weakness (generalized) (M62.81)   Activity Tolerance Patient tolerated treatment well   Patient Left in chair;with call bell/phone within reach;with chair alarm set   Nurse Communication Mobility status;Other (comment) (notified pt was in chair)        Time: 6270-3500 OT Time Calculation (min): 19 min  Charges: OT General Charges $OT Visit: 1 Visit OT Treatments $Self Care/Home Management : 8-22 mins  Labrian Torregrossa OT, MOT   Larey Seat 03/01/2022, 10:06 AM

## 2022-03-01 NOTE — Progress Notes (Signed)
Nurses Khiya Friese, RN and Izora Gala, RN called to patient room by Nurse Netty Starring.  Patient was sitting on the toilet and appeared to having a vagal response.  Rapid response was called and patients blood pressure at this time was 36/18 on dinamap.  Patient transferred to bed by rapid response, and gave a fluid bolus, placed in reverse Trendelenburg position.  AED pads in place.  Patients bp improved taken manually at 60/40.[name] KitchenPatient continues to improve after vegal incident.

## 2022-03-01 NOTE — Progress Notes (Signed)
This Probation officer performed I&O with sterile technique. Obtained 200 ml's of bladder output in drainage bag. Pt stated immediate relief. MD aware, did not want to culture at this time. Plan of care ongoing.

## 2022-03-01 NOTE — Progress Notes (Signed)
PROGRESS NOTE   Walter [name]  SWH:[id number] DOB: 10/23/1950 DOA: 02/25/2022 PCP: Dettinger, Fransisca Kaufmann, MD   Chief Complaint  Patient presents with   Altered Mental Status   Level of care: Med-Surg  Brief Admission History:   71 y.o. male with medical history significant of TIA, CVA, C. difficile colitis, COPD, coronary artery disease, hypertension, hyperlipidemia, and more presents to the ED with a chief complaint of altered mental status.  Patient is not able to provide any history.  Wife had reported to the ED provider that patient went to bed around midnight and was at baseline at that time.  A couple of hours later he was difficult to arouse, and was not speaking.  She called EMS for altered mental status.  In the ER at first he was aphasic.  He was also hypersomnolent.  Throughout his stay in the ER he has become more alert, the only worried he will say is "alright."  He is following commands and can move all 4 extremities.  Code stroke was done in the ER and he was determined not to be a candidate for acute intervention.  They recommended admission for stroke and metabolic work-up.   It is documented in previous charts that patient is DNR.   Assessment and Plan: * Question of acute CVA (cerebral vascular accident) - Pt was unable to tolerate MRI and repeat CT was done 24 hours after first CT scan.  - no evidence of acute CVA found - continue aspirin plavix that he was on prior to admission  --PT recommending SNF and after discussing with wife she would like to pursue SNF placement; --TOC consulted for SNF placement   Vasovagal syncope - on 11/28 prior to discharge, he was having a large bowel movement and experienced a syncopal episode; a rapid response was called and he responded rapidly to a fluid bolus and trendelenburg positioning.  DC to SNF held.  If stable hopefully can DC to [facility] 11/29.    Aspiration pneumonia ([place]) - He seems to have aspirated on 02/27/2022 and now has  findings of pneumonia on chest x-ray, we are starting oral Augmentin 875 mg BID -Continue aspiration precautions and dysphagia diet ordered by speech-language pathologist  AMS (altered mental status) -improving to baseline; started him on seroquel 25 mg QHS for severe sundowning which seems to have improved; after speaking with nursing facility they prefer he not be on antipsychotics so we discontinued seroquel; the nursing facility plans to try him on depakote sprinkles.   Leukocytosis - UA is not indicative of UTI - No respiratory symptoms observed, patient maintaining oxygen sats on room air - He appears to have aspirated on 11/26, he now has evidence of pneumonia on CXR and we are starting antibiotics   Coronary artery disease - Continue aspirin, Plavix, statin - Monitor on telemetry  Acute metabolic encephalopathy - he seems to be back to his baseline - started seroquel 25 mg QHS on 11/25; discussed with SNF they prefer he not be on antipsychotics and they plan to start him on depakote sprinkles.  DC seroquel.   - 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  -- follow up Dementia lab panel (no significant findings) -  MRI unable to be completed as patient could not tolerate test - Plan to DC to SNF 11/29 if stable   Vascular dementia ([place]) - Pt has intermittent confusion; monitor closely  -- lipids are uncontrolled, continue atorvastatin 40 mg daily (suspect wasn't taking it at home) -- follow up dementia lab panel (studies WNL)  Hyperlipidemia, mixed - Continue statin  HTN (hypertension) - working to normalize BP now that CVA has been ruled out with repeated CT scanning   DVT  prophylaxis: [place] heparin Code Status: DNR  Family Communication: wife bedside update  Disposition: anticipating STR SNF placement on 11/29 **DC held 11/28 due to episode of vasovagal syncope; family was present and afraid; will observe overnight and DC to SNF 11/29 if stable    Consultants:   Procedures:   Antimicrobials:    Subjective: Pt had a vasovagal episode while passing a large bowel movement, he passed out briefly but quickly came back around after a fluid bolus.  I was present for the rapid response.       Objective: Vitals:   03/01/22 0558 03/01/22 0721 03/01/22 1140 03/01/22 1430  BP: 114/70  130/81 127/63  Pulse: (!) 103   82  Resp: 18   20  Temp: 98.7 F (37.1 C)   98 F (36.7 C)  TempSrc:    Oral  SpO2: 94% 96%  97%  Weight:      Height:        Intake/Output Summary (Last 24 hours) at 03/01/2022 1606 Last data filed at 03/01/2022 1300 Gross per 24 hour  Intake 555 ml  Output --  Net 555 ml   Filed Weights   02/28/22 1100  Weight: 86.6 kg   Examination:  General exam: Appears calm and comfortable  Respiratory system: Clear to auscultation. Respiratory effort normal. Cardiovascular system: normal S1 & S2 heard. No JVD, murmurs, rubs, gallops or clicks. No pedal edema. Gastrointestinal system: Abdomen is nondistended, soft and nontender. No organomegaly or masses felt. Normal bowel sounds heard. Central nervous system: Alert and confused with dementia. No focal neurological deficits. Extremities: Symmetric 5 x 5 power. Skin: No rashes, lesions or ulcers. Psychiatry: Judgement and insight appear poor. Mood & affect agitated.   Data Reviewed: I have personally reviewed following labs and imaging studies  CBC: Recent Labs  Lab 02/25/22 0307 02/26/22 0401 02/28/22 0357  WBC 11.2* 11.7* 11.3*  NEUTROABS 7.7  --  7.8*  HGB 16.2 16.7 16.2  HCT 49.1 51.1 48.9  MCV 89.4 91.1 91.1  PLT 300 299 741    Basic Metabolic Panel: Recent Labs  Lab  02/25/22 0307 02/26/22 0401 02/28/22 0357  NA 134* 138 138  K 3.8 4.1 3.6  CL 107 105 111  CO2 20* 20* 19*  GLUCOSE 122* 110* 120*  BUN '13 13 20  '$ CREATININE 1.16 1.04 1.45*  CALCIUM 8.7* 9.4 8.9    CBG: Recent Labs  Lab 02/25/22 0316 03/01/22 1047  GLUCAP 139* 155*    No results found for this or any previous visit (from the past 240 hour(s)).   Radiology Studies: No results found.  Scheduled Meds:  amLODipine  5 mg Oral Daily   amoxicillin-clavulanate  1 tablet Oral Q12H   aspirin EC  81 mg Oral Daily   atorvastatin  40 mg Oral QPM   clopidogrel  75 mg Oral Daily   dextromethorphan  30 mg Oral BID   enoxaparin (LOVENOX) injection  40 mg Subcutaneous Q24H   guaiFENesin  600 mg  Oral BID   ipratropium-albuterol  3 mL Nebulization BID   losartan  25 mg Oral Daily   saccharomyces boulardii  250 mg Oral BID   tamsulosin  0.4 mg Oral Daily   Continuous Infusions:   LOS: 3 days   Time spent: 35 mins  Jacai Kipp Wynetta Emery, MD How to contact the [facility] Attending or Consulting provider [place] or covering provider during after hours [place], for this patient?  Check the care team in [facility] and look for a) attending/consulting TRH provider listed and b) the [facility] team listed Log into [email or web address] and use [place]'s universal password to access. If you do not have the password, please contact the hospital operator. Locate the [facility] provider you are looking for under [facility] and page to a number that you can be directly reached. If you still have difficulty reaching the provider, please page the [facility] (Director on Call) for the Hospitalists listed on amion for assistance.  03/01/2022, 4:06 PM

## 2022-03-01 NOTE — TOC Progression Note (Signed)
Transition of Care [facility]) - Progression Note    Patient Details  Name: Walter [name] MRN: [id number] Date of Birth: Jan 29, 1951  Transition [facility] CM/SW Contact  Salome Arnt, [place] Phone Number: 03/01/2022, 11:08 AM  Clinical Narrative: Lenna Sciara at [facility] assessed pt and can make bed offer as long as Seroquel discontinued. MD aware and agreeable. Pt's wife accepts bed offer at [facility]. Anticipate d/c tomorrow per MD. Authorization updated.       Expected Discharge Plan: [place] Barriers to Discharge: Continued Medical Work up  Expected Discharge Plan and Services Expected Discharge Plan: [place] In-house Referral: Clinical Social Work   Post Acute Care Choice: [place] Living arrangements for the past 2 months: Single Family Home                                       Social Determinants of Health (SDOH) Interventions    Readmission Risk Interventions     No data to display

## 2022-03-01 NOTE — Clinical Note (Incomplete)
Rapid Response Event Note   Reason for Call :    Initial Focused Assessment:       Interventions:    Plan of Care:     Event Summary:   MD Notified:  Call Time: Arrival Time: End Time:  Molly Maduro, RN

## 2022-03-01 NOTE — Progress Notes (Signed)
Bladder scan showed 419.  Dr. Wynetta Emery notified and ordered I & O prn as needed up to 20 times.  Two attempts just now for I & O and unable to reach bladder.  Will pass on to night shift for another attempt.

## 2022-03-01 NOTE — Assessment & Plan Note (Signed)
-   on 11/28 prior to discharge, he was having a large bowel movement and experienced a syncopal episode; a rapid response was called and he responded rapidly to a fluid bolus and trendelenburg positioning.  DC to SNF held.  If stable hopefully can DC to [facility] 11/29.

## 2022-03-02 ENCOUNTER — Inpatient Hospital Stay (HOSPITAL_COMMUNITY): Payer: Medicare HMO

## 2022-03-02 DIAGNOSIS — Z7189 Other specified counseling: Secondary | ICD-10-CM | POA: Diagnosis not present

## 2022-03-02 DIAGNOSIS — R471 Dysarthria and anarthria: Secondary | ICD-10-CM | POA: Diagnosis not present

## 2022-03-02 DIAGNOSIS — J9690 Respiratory failure, unspecified, unspecified whether with hypoxia or hypercapnia: Secondary | ICD-10-CM | POA: Diagnosis not present

## 2022-03-02 DIAGNOSIS — F01511 Vascular dementia, unspecified severity, with agitation: Secondary | ICD-10-CM | POA: Diagnosis not present

## 2022-03-02 DIAGNOSIS — I10 Essential (primary) hypertension: Secondary | ICD-10-CM | POA: Diagnosis not present

## 2022-03-02 DIAGNOSIS — F32A Depression, unspecified: Secondary | ICD-10-CM | POA: Diagnosis not present

## 2022-03-02 DIAGNOSIS — J690 Pneumonitis due to inhalation of food and vomit: Secondary | ICD-10-CM | POA: Diagnosis not present

## 2022-03-02 DIAGNOSIS — G9341 Metabolic encephalopathy: Secondary | ICD-10-CM | POA: Diagnosis not present

## 2022-03-02 DIAGNOSIS — J449 Chronic obstructive pulmonary disease, unspecified: Secondary | ICD-10-CM | POA: Diagnosis not present

## 2022-03-02 DIAGNOSIS — E785 Hyperlipidemia, unspecified: Secondary | ICD-10-CM | POA: Diagnosis not present

## 2022-03-02 DIAGNOSIS — L210 Seborrhea capitis: Secondary | ICD-10-CM | POA: Diagnosis not present

## 2022-03-02 DIAGNOSIS — N400 Enlarged prostate without lower urinary tract symptoms: Secondary | ICD-10-CM | POA: Diagnosis not present

## 2022-03-02 DIAGNOSIS — I2510 Atherosclerotic heart disease of native coronary artery without angina pectoris: Secondary | ICD-10-CM | POA: Diagnosis not present

## 2022-03-02 DIAGNOSIS — R55 Syncope and collapse: Secondary | ICD-10-CM | POA: Diagnosis not present

## 2022-03-02 DIAGNOSIS — R4701 Aphasia: Secondary | ICD-10-CM | POA: Diagnosis not present

## 2022-03-02 DIAGNOSIS — J439 Emphysema, unspecified: Secondary | ICD-10-CM | POA: Diagnosis not present

## 2022-03-02 DIAGNOSIS — R4182 Altered mental status, unspecified: Secondary | ICD-10-CM | POA: Diagnosis not present

## 2022-03-02 DIAGNOSIS — I639 Cerebral infarction, unspecified: Secondary | ICD-10-CM | POA: Diagnosis not present

## 2022-03-02 DIAGNOSIS — Z79899 Other long term (current) drug therapy: Secondary | ICD-10-CM | POA: Diagnosis not present

## 2022-03-02 DIAGNOSIS — E559 Vitamin D deficiency, unspecified: Secondary | ICD-10-CM | POA: Diagnosis not present

## 2022-03-02 MED ORDER — SACCHAROMYCES BOULARDII 250 MG PO CAPS: 250.0000 mg | ORAL_CAPSULE | Freq: Two times a day (BID) | ORAL | 0 refills | Status: AC

## 2022-03-02 MED ORDER — LOSARTAN POTASSIUM 25 MG PO TABS: 25.0000 mg | ORAL_TABLET | Freq: Every day | ORAL | 0 refills | Status: DC

## 2022-03-02 MED ORDER — AMOXICILLIN-POT CLAVULANATE 875-125 MG PO TABS: 1.0000 | ORAL_TABLET | Freq: Two times a day (BID) | ORAL | 0 refills | Status: AC

## 2022-03-02 NOTE — TOC Transition Note (Addendum)
Transition of Care [facility]) - CM/SW Discharge Note   Patient Details  Name: SIDHARTH LEVERETTE MRN: [id number] Date of Birth: 11/23/1950  [facility] CM/SW Contact:  Shade Flood, LCSW Phone Number: 03/02/2022, 12:15 PM   Clinical Narrative:     Pt stable for dc today per MD. Updated Melissa at [facility] and they can admit pt today. DC clinical sent electronically. RN to call report. EMS arranged.  There are no other TOC needs for dc.  12:27 Pt does not need EMS. Wife to transport. EMS cancelled.  Final next level of care: Skilled Nursing Facility Barriers to Discharge: Barriers Resolved   Patient Goals and CMS Choice Patient states their goals for this hospitalization and ongoing recovery are:: SNF rehab CMS Medicare.gov Compare Post Acute Care list provided to:: Patient Represenative (must comment) Choice offered to / list presented to : Spouse  Discharge Placement              Patient chooses bed at: [facility] Patient to be transferred to facility by: EMS Name of family member notified: [name] Patient and family notified of of transfer: 03/02/22  Discharge Plan and Services In-house Referral: Clinical Social Work   Post Acute Care Choice: [place]                               Social Determinants of Health (SDOH) Interventions     Readmission Risk Interventions     No data to display

## 2022-03-02 NOTE — Discharge Summary (Signed)
Physician Discharge Summary  THADDAEUS GRANJA OAC:[id number] DOB: 1950-09-07 DOA: 02/25/2022  PCP: Dettinger, Fransisca Kaufmann, MD  Admit date: 02/25/2022  Discharge date: 03/02/2022  Admitted From:SNF  Disposition:  SNF  Recommendations for Outpatient Follow-up:  Follow up with PCP in 1-2 weeks Continue on aspirin, Plavix, and atorvastatin as prior Continue on Augmentin for 3 more days as prescribed to complete treatment for aspiration pneumonia Recommend treatment of dementia with behavioral agitation using Depakote sprinkles with possible at SNF Continue other home medications as prior  Home Health: None  Equipment/Devices: None  Discharge Condition:Stable  CODE STATUS: DNR  Diet recommendation: Dys 3  Brief/Interim Summary: 71 y.o. male with medical history significant of TIA, CVA, C. difficile colitis, COPD, vascular dementia, coronary artery disease, hypertension, hyperlipidemia, and more presents to the ED with a chief complaint of altered mental status.  Patient is not able to provide any history.  Wife had reported to the ED provider that patient went to bed around midnight and was at baseline at that time.  A couple of hours later he was difficult to arouse, and was not speaking.  She called EMS for altered mental status.  He was admitted for questionable CVA and CT study with no findings of CVA and patient could not tolerate brain MRI.  Recommendations were to continue on aspirin, Plavix, and statin as previously prescribed.  He was also noted to have some aspiration for which she was started on Augmentin.  He was going to be discharged on 11/28, but had an episode of vasovagal syncope.  He seems as though he is at baseline at this time with no further acute events noted.  He is currently in stable condition for discharge to SNF.  Discharge Diagnoses:  Principal Problem:   Question of acute CVA (cerebral vascular accident) Active Problems:   HTN (hypertension)   Hyperlipidemia,  mixed   Vascular dementia ([place])   Acute metabolic encephalopathy   Coronary artery disease   Leukocytosis   AMS (altered mental status)   Aspiration pneumonia (HCC)   Vasovagal syncope  Principal discharge diagnosis: Acute metabolic encephalopathy with questionable acute CVA.  Vascular dementia with associated dysphagia and aspiration pneumonia.  Discharge Instructions  Discharge Instructions     Diet - low sodium heart healthy   Complete by: As directed    Increase activity slowly   Complete by: As directed       Allergies as of 03/02/2022   No Known Allergies      Medication List     TAKE these medications    acetaminophen 325 MG tablet Commonly known as: TYLENOL Take 1-2 tablets (325-650 mg total) by mouth every 6 (six) hours as needed for mild pain (pain score 1-3).   amLODipine 5 MG tablet Commonly known as: NORVASC Take 1 tablet (5 mg total) by mouth daily.   amoxicillin-clavulanate 875-125 MG tablet Commonly known as: AUGMENTIN Take 1 tablet by mouth every 12 (twelve) hours for 3 days.   aspirin EC 81 MG tablet Take 81 mg by mouth daily. Swallow whole.   atorvastatin 40 MG tablet Commonly known as: LIPITOR Take 1 tablet (40 mg total) by mouth daily.   clopidogrel 75 MG tablet Commonly known as: PLAVIX TAKE 1 TABLET EVERY DAY   losartan 25 MG tablet Commonly known as: COZAAR Take 1 tablet (25 mg total) by mouth daily. Start taking on: March 03, 2022   ONE-A-DAY 50 PLUS PO Take by mouth daily.   saccharomyces boulardii 250 MG  capsule Commonly known as: FLORASTOR Take 1 capsule (250 mg total) by mouth 2 (two) times daily.   tamsulosin 0.4 MG Caps capsule Commonly known as: FLOMAX Take 1 capsule (0.4 mg total) by mouth daily after breakfast.        Contact information for follow-up providers     Dettinger, Fransisca Kaufmann, MD. Schedule an appointment as soon as possible for a visit in 1 week(s).   Specialties: Family Medicine,  Cardiology Contact information: [place] [address] [phone]              Contact information for after-discharge care     Destination     [facility] .   Service: Skilled Nursing Contact information: [place] [phone]                    No Known Allergies  Consultations: Neurology   Procedures/Studies: DG CHEST PORT 1 VIEW  Result Date: 03/02/2022 CLINICAL DATA:  Acute hypoxemic respiratory failure. EXAM: PORTABLE CHEST 1 VIEW COMPARISON:  02/27/2022 FINDINGS: The cardiac silhouette, mediastinal and hilar contours are within normal limits and stable. Stable underlying emphysematous changes and pulmonary scarring. No acute overlying pulmonary process. The bony thorax is intact. IMPRESSION: Chronic emphysematous changes and pulmonary scarring but no acute overlying pulmonary process. Electronically Signed   By: Marijo Sanes M.D.   On: 03/02/2022 11:17   DG CHEST PORT 1 VIEW  Result Date: 02/27/2022 CLINICAL DATA:  Congestion EXAM: PORTABLE CHEST 1 VIEW COMPARISON:  None Available. FINDINGS: Mild vascular congestion. There is density at the left base which could represent pneumonia or layering effusion. No pleural effusion on the right. No pneumothorax. Unremarkable cardiac silhouette. Calcified aorta. IMPRESSION: Left base consolidation and/or effusion.  Mild vascular congestion. Electronically Signed   By: Sammie Bench M.D.   On: 02/27/2022 14:07   CT HEAD WO CONTRAST (5MM)  Result Date: 02/26/2022 CLINICAL DATA:  Altered mental status, stroke follow-up.  Seizure. EXAM: CT HEAD WITHOUT CONTRAST TECHNIQUE: Contiguous axial images were obtained from the base of the skull through the vertex without intravenous contrast. RADIATION DOSE REDUCTION: This exam was performed according to the departmental dose-optimization program which includes automated exposure control, adjustment of the mA and/or  kV according to patient size and/or use of iterative reconstruction technique. COMPARISON:  CT head 1 day prior FINDINGS: Brain: There is no acute intracranial hemorrhage, extra-axial fluid collection, or acute infarct Background parenchymal volume loss with prominence of the ventricular system and extra-axial CSF spaces is stable. Remote infarcts in the bilateral basal ganglia and background advanced chronic small-vessel ischemic change are stable. There is no mass lesion.  There is no mass effect or midline shift. Vascular: There is calcification of the bilateral carotid siphons and vertebral arteries. Skull: Normal. Negative for fracture or focal lesion. Sinuses/Orbits: There is mild mucosal thickening in the ethmoid air cells. Bilateral lens implants are in place. The globes and orbits are otherwise unremarkable. Other: None. IMPRESSION: Stable noncontrast head CT with no acute intracranial pathology. Electronically Signed   By: Valetta Mole M.D.   On: 02/26/2022 12:05   ECHOCARDIOGRAM COMPLETE  Result Date: 02/25/2022    ECHOCARDIOGRAM REPORT   Patient Name:   ABNER ARDIS Date of Exam: 02/25/2022 Medical Rec #:  [id number]    Height:       73.0 in Accession #:    [id number]   Weight:       191.0 lb  Date of Birth:  May 03, 1950    BSA:          2.110 m Patient Age:    71 years     BP:           183/102 mmHg Patient Gender: M            HR:           91 bpm. Exam Location:  [facility] Procedure: 2D Echo, Cardiac Doppler and Color Doppler Indications:    Stroke  History:        Patient has prior history of Echocardiogram examinations, most                 recent 02/18/2012. Signs/Symptoms:Altered Mental Status; Risk                 Factors:Hypertension, Dyslipidemia and Former Smoker.  Sonographer:    Wenda Low Referring Phys: [id number] ASIA B [place]  Sonographer Comments: Image acquisition challenging due to patient behavioral factors. IMPRESSIONS  1. Left ventricular ejection fraction, by  estimation, is 65 to 70%. The left ventricle has normal function. The left ventricle has no regional wall motion abnormalities. There is severe asymmetric left ventricular hypertrophy of the septal segment. Left  ventricular diastolic parameters are consistent with Grade I diastolic dysfunction (impaired relaxation).  2. Right ventricular systolic function is normal. The right ventricular size is normal. Tricuspid regurgitation signal is inadequate for assessing PA pressure.  3. The mitral valve is grossly normal. No evidence of mitral valve regurgitation. No evidence of mitral stenosis.  4. The aortic valve is tricuspid. Aortic valve regurgitation is not visualized. No aortic stenosis is present.  5. There is mild dilatation of the aortic root, measuring 40 mm. Comparison(s): LV more vigorous from 2013 report. Aortic root size larger that prior reporting. FINDINGS  Left Ventricle: Left ventricular ejection fraction, by estimation, is 65 to 70%. The left ventricle has normal function. The left ventricle has no regional wall motion abnormalities. The left ventricular internal cavity size was normal in size. There is  severe asymmetric left ventricular hypertrophy of the septal segment. Left ventricular diastolic parameters are consistent with Grade I diastolic dysfunction (impaired relaxation). Right Ventricle: The right ventricular size is normal. No increase in right ventricular wall thickness. Right ventricular systolic function is normal. Tricuspid regurgitation signal is inadequate for assessing PA pressure. Left Atrium: Left atrial size was normal in size. Right Atrium: Right atrial size was normal in size. Pericardium: Trivial pericardial effusion is present. The pericardial effusion is anterior to the right ventricle. Presence of epicardial fat layer. Mitral Valve: The mitral valve is grossly normal. No evidence of mitral valve regurgitation. No evidence of mitral valve stenosis. MV peak gradient, 2.9 mmHg.  The mean mitral valve gradient is 1.0 mmHg. Tricuspid Valve: The tricuspid valve is normal in structure. Tricuspid valve regurgitation is not demonstrated. No evidence of tricuspid stenosis. Aortic Valve: The aortic valve is tricuspid. Aortic valve regurgitation is not visualized. No aortic stenosis is present. Aortic valve mean gradient measures 1.0 mmHg. Aortic valve peak gradient measures 2.8 mmHg. Aortic valve area, by VTI measures 4.25 cm. Pulmonic Valve: The pulmonic valve was normal in structure. Pulmonic valve regurgitation is not visualized. No evidence of pulmonic stenosis. Aorta: The ascending aorta was not well visualized. There is mild dilatation of the aortic root, measuring 40 mm. Venous: The inferior vena cava was not well visualized. IAS/Shunts: No atrial level shunt detected by color flow Doppler.  LEFT  VENTRICLE PLAX 2D LVIDd:         4.30 cm   Diastology LVIDs:         2.65 cm   LV e' medial:  6.85 cm/s LV PW:         1.30 cm   LV e' lateral: 6.96 cm/s LV IVS:        1.50 cm LVOT diam:     2.40 cm LV SV:         63 LV SV Index:   30 LVOT Area:     4.52 cm  LEFT ATRIUM             Index LA diam:        4.10 cm 1.94 cm/m LA Vol (A2C):   53.9 ml 25.55 ml/m LA Vol (A4C):   40.4 ml 19.15 ml/m LA Biplane Vol: 51.8 ml 24.55 ml/m  AORTIC VALVE                    PULMONIC VALVE AV Area (Vmax):    4.42 cm     PV Vmax:       0.96 m/s AV Area (Vmean):   4.87 cm     PV Peak grad:  3.7 mmHg AV Area (VTI):     4.25 cm AV Vmax:           84.10 cm/s AV Vmean:          52.900 cm/s AV VTI:            0.149 m AV Peak Grad:      2.8 mmHg AV Mean Grad:      1.0 mmHg LVOT Vmax:         82.20 cm/s LVOT Vmean:        57.000 cm/s LVOT VTI:          0.140 m LVOT/AV VTI ratio: 0.94  AORTA Ao Root diam: 4.10 cm Ao Asc diam:  3.60 cm MITRAL VALVE MV Area (PHT): 4.58 cm   SHUNTS MV Area VTI:   3.77 cm   Systemic VTI:  0.14 m MV Peak grad:  2.9 mmHg   Systemic Diam: 2.40 cm MV Mean grad:  1.0 mmHg MV Vmax:       0.86  m/s MV Vmean:      49.9 cm/s Rudean Haskell MD Electronically signed by Rudean Haskell MD Signature Date/Time: 02/25/2022/11:19:00 AM    Final    US Carotid Bilateral  Result Date: 02/25/2022 CLINICAL DATA:  Altered mental status EXAM: BILATERAL CAROTID DUPLEX ULTRASOUND TECHNIQUE: Pearline Cables scale imaging, color Doppler and duplex ultrasound were performed of bilateral carotid and vertebral arteries in the neck. COMPARISON:  01/27/2020 CTA neck FINDINGS: Criteria: Quantification of carotid stenosis is based on velocity parameters that correlate the residual internal carotid diameter with NASCET-based stenosis levels, using the diameter of the distal internal carotid lumen as the denominator for stenosis measurement. The following velocity measurements were obtained: RIGHT ICA: 345/46 cm/sec CCA: 732/20 cm/sec SYSTOLIC ICA/CCA RATIO:  2 9 ECA: 106 cm/sec LEFT ICA: 74/16 cm/sec CCA: 254/27 cm/sec SYSTOLIC ICA/CCA RATIO:  0.6 ECA: 39 cm/sec RIGHT CAROTID ARTERY: Proximal mid ICA mixed echogenicity atherosclerosis. At this location, there is marked velocity elevation measuring 345/46 centimeters/second with some degree of turbulent flow/spectral broadening. Right ICA stenosis estimated at greater than 70% by ultrasound criteria. RIGHT VERTEBRAL ARTERY:  Antegrade flow LEFT CAROTID ARTERY: Similar scattered minor echogenic plaque formation. No hemodynamically significant left ICA stenosis, velocity elevation,  or turbulent flow. LEFT VERTEBRAL ARTERY:  Not visualized IMPRESSION: 1. Right ICA stenosis estimated at greater than 70%. 2. Left ICA stenosis less than 50%. 3. Patent antegrade right vertebral flow. 4. Nonvisualized left vertebral artery. Electronically Signed   By: Jerilynn Mages.  Shick M.D.   On: 02/25/2022 10:51   CT HEAD CODE STROKE WO CONTRAST  Result Date: 02/25/2022 CLINICAL DATA:  Code stroke.  Confusion and altered mental status EXAM: CT HEAD WITHOUT CONTRAST TECHNIQUE: Contiguous axial images were  obtained from the base of the skull through the vertex without intravenous contrast. RADIATION DOSE REDUCTION: This exam was performed according to the departmental dose-optimization program which includes automated exposure control, adjustment of the mA and/or kV according to patient size and/or use of iterative reconstruction technique. COMPARISON:  01/27/2020 FINDINGS: Evaluation is somewhat limited by motion artifact. Brain: No evidence of acute infarction, hemorrhage, cerebral edema, mass, mass effect, or midline shift. No hydrocephalus or extra-axial collection. Redemonstrated remote infarcts in the bilateral basal ganglia. Periventricular white matter changes, likely the sequela of chronic small vessel ischemic disease. Vascular: No hyperdense vessel. Atherosclerotic calcifications in the intracranial carotid and vertebral arteries. Skull: Negative for fracture or focal lesion. Sinuses/Orbits: Clear paranasal sinuses. Status post bilateral lens replacements. Other: The mastoid air cells are well aerated. ASPECTS [facility] Stroke Program Early CT Score) - Ganglionic level infarction (caudate, lentiform nuclei, internal capsule, insula, M1-M3 cortex): 7 - Supraganglionic infarction (M4-M6 cortex): 3 Total score (0-10 with 10 being normal): 10 IMPRESSION: 1. No acute intracranial process. 2. ASPECTS is 10 Code stroke imaging results were communicated on 02/25/2022 at 3:20 am to provider [facility] via telephone, who verbally acknowledged these results. Electronically Signed   By: Merilyn Baba M.D.   On: 02/25/2022 03:21     Discharge Exam: Vitals:   03/02/22 1106 03/02/22 1140  BP:    Pulse:    Resp:    Temp:    SpO2: 95% 95%   Vitals:   03/02/22 1103 03/02/22 1104 03/02/22 1106 03/02/22 1140  BP:      Pulse:      Resp:      Temp:      TempSrc:      SpO2: 95% 96% 95% 95%  Weight:      Height:        General: Pt is alert, awake, not in acute distress Cardiovascular: RRR, S1/S2 +, no rubs,  no gallops Respiratory: CTA bilaterally, no wheezing, no rhonchi Abdominal: Soft, NT, ND, bowel sounds + Extremities: no edema, no cyanosis    The results of significant diagnostics from this hospitalization (including imaging, microbiology, ancillary and laboratory) are listed below for reference.     Microbiology: No results found for this or any previous visit (from the past 240 hour(s)).   Labs: BNP (last 3 results) No results for input(s): "BNP" in the last 8760 hours. Basic Metabolic Panel: Recent Labs  Lab 02/25/22 0307 02/26/22 0401 02/28/22 0357  NA 134* 138 138  K 3.8 4.1 3.6  CL 107 105 111  CO2 20* 20* 19*  GLUCOSE 122* 110* 120*  BUN '13 13 20  '$ CREATININE 1.16 1.04 1.45*  CALCIUM 8.7* 9.4 8.9   Liver Function Tests: Recent Labs  Lab 02/25/22 0307 02/26/22 0401  AST 16 18  ALT 12 12  ALKPHOS 89 82  BILITOT 0.6 0.9  PROT 7.7 8.1  ALBUMIN 3.8 3.9   No results for input(s): "LIPASE", "AMYLASE" in the last 168 hours. No results for input(s): "AMMONIA"  in the last 168 hours. CBC: Recent Labs  Lab 02/25/22 0307 02/26/22 0401 02/28/22 0357  WBC 11.2* 11.7* 11.3*  NEUTROABS 7.7  --  7.8*  HGB 16.2 16.7 16.2  HCT 49.1 51.1 48.9  MCV 89.4 91.1 91.1  PLT 300 299 289   Cardiac Enzymes: No results for input(s): "CKTOTAL", "CKMB", "CKMBINDEX", "TROPONINI" in the last 168 hours. BNP: Invalid input(s): "POCBNP" CBG: Recent Labs  Lab 02/25/22 0316 03/01/22 1047  GLUCAP 139* 155*   D-Dimer No results for input(s): "DDIMER" in the last 72 hours. Hgb A1c No results for input(s): "HGBA1C" in the last 72 hours. Lipid Profile No results for input(s): "CHOL", "HDL", "LDLCALC", "TRIG", "CHOLHDL", "LDLDIRECT" in the last 72 hours. Thyroid function studies No results for input(s): "TSH", "T4TOTAL", "T3FREE", "THYROIDAB" in the last 72 hours.  Invalid input(s): "FREET3" Anemia work up No results for input(s): "VITAMINB12", "FOLATE", "FERRITIN", "TIBC",  "IRON", "RETICCTPCT" in the last 72 hours. Urinalysis    Component Value Date/Time   COLORURINE YELLOW 02/25/2022 0334   APPEARANCEUR CLEAR 02/25/2022 0334   APPEARANCEUR Clear 12/28/2018 1110   LABSPEC 1.019 02/25/2022 0334   PHURINE 5.0 02/25/2022 0334   GLUCOSEU NEGATIVE 02/25/2022 0334   HGBUR SMALL (A) 02/25/2022 0334   BILIRUBINUR NEGATIVE 02/25/2022 0334   BILIRUBINUR Negative 12/28/2018 1110   KETONESUR NEGATIVE 02/25/2022 0334   PROTEINUR 30 (A) 02/25/2022 0334   UROBILINOGEN 0.2 02/12/2014 1445   NITRITE NEGATIVE 02/25/2022 0334   LEUKOCYTESUR NEGATIVE 02/25/2022 0334   Sepsis Labs Recent Labs  Lab 02/25/22 0307 02/26/22 0401 02/28/22 0357  WBC 11.2* 11.7* 11.3*   Microbiology No results found for this or any previous visit (from the past 240 hour(s)).   Time coordinating discharge: 35 minutes  SIGNED:   Rodena Goldmann, DO [facility] 03/02/2022, 12:04 PM  If 7PM-7AM, please contact night-coverage [email or web address]

## 2022-03-02 NOTE — Progress Notes (Signed)
Report given to [facility], nurse at [employer]. No further questions at this time. Wife will take patient over to facility.

## 2022-03-03 DIAGNOSIS — Z79899 Other long term (current) drug therapy: Secondary | ICD-10-CM | POA: Diagnosis not present

## 2022-03-03 DIAGNOSIS — J690 Pneumonitis due to inhalation of food and vomit: Secondary | ICD-10-CM | POA: Diagnosis not present

## 2022-03-03 DIAGNOSIS — I2510 Atherosclerotic heart disease of native coronary artery without angina pectoris: Secondary | ICD-10-CM | POA: Diagnosis not present

## 2022-03-03 DIAGNOSIS — R55 Syncope and collapse: Secondary | ICD-10-CM | POA: Diagnosis not present

## 2022-03-03 DIAGNOSIS — R4182 Altered mental status, unspecified: Secondary | ICD-10-CM | POA: Diagnosis not present

## 2022-03-07 DIAGNOSIS — L210 Seborrhea capitis: Secondary | ICD-10-CM | POA: Diagnosis not present

## 2022-03-07 DIAGNOSIS — Z7189 Other specified counseling: Secondary | ICD-10-CM | POA: Diagnosis not present

## 2022-03-07 DIAGNOSIS — I639 Cerebral infarction, unspecified: Secondary | ICD-10-CM | POA: Diagnosis not present

## 2022-03-07 DIAGNOSIS — Z79899 Other long term (current) drug therapy: Secondary | ICD-10-CM | POA: Diagnosis not present

## 2022-03-07 DIAGNOSIS — R4182 Altered mental status, unspecified: Secondary | ICD-10-CM | POA: Diagnosis not present

## 2022-03-07 DIAGNOSIS — J690 Pneumonitis due to inhalation of food and vomit: Secondary | ICD-10-CM | POA: Diagnosis not present

## 2022-03-07 DIAGNOSIS — I10 Essential (primary) hypertension: Secondary | ICD-10-CM | POA: Diagnosis not present

## 2022-03-08 DIAGNOSIS — E559 Vitamin D deficiency, unspecified: Secondary | ICD-10-CM | POA: Diagnosis not present

## 2022-03-08 DIAGNOSIS — I10 Essential (primary) hypertension: Secondary | ICD-10-CM | POA: Diagnosis not present

## 2022-03-08 DIAGNOSIS — R4182 Altered mental status, unspecified: Secondary | ICD-10-CM | POA: Diagnosis not present

## 2022-03-08 DIAGNOSIS — I639 Cerebral infarction, unspecified: Secondary | ICD-10-CM | POA: Diagnosis not present

## 2022-03-08 DIAGNOSIS — R55 Syncope and collapse: Secondary | ICD-10-CM | POA: Diagnosis not present

## 2022-03-08 DIAGNOSIS — J690 Pneumonitis due to inhalation of food and vomit: Secondary | ICD-10-CM | POA: Diagnosis not present

## 2022-03-08 DIAGNOSIS — L210 Seborrhea capitis: Secondary | ICD-10-CM | POA: Diagnosis not present

## 2022-03-08 DIAGNOSIS — Z79899 Other long term (current) drug therapy: Secondary | ICD-10-CM | POA: Diagnosis not present

## 2022-03-08 DIAGNOSIS — I2510 Atherosclerotic heart disease of native coronary artery without angina pectoris: Secondary | ICD-10-CM | POA: Diagnosis not present

## 2022-03-09 DIAGNOSIS — I10 Essential (primary) hypertension: Secondary | ICD-10-CM | POA: Diagnosis not present

## 2022-03-09 DIAGNOSIS — J690 Pneumonitis due to inhalation of food and vomit: Secondary | ICD-10-CM | POA: Diagnosis not present

## 2022-03-09 DIAGNOSIS — R55 Syncope and collapse: Secondary | ICD-10-CM | POA: Diagnosis not present

## 2022-03-09 DIAGNOSIS — I2510 Atherosclerotic heart disease of native coronary artery without angina pectoris: Secondary | ICD-10-CM | POA: Diagnosis not present

## 2022-03-09 DIAGNOSIS — E559 Vitamin D deficiency, unspecified: Secondary | ICD-10-CM | POA: Diagnosis not present

## 2022-03-09 DIAGNOSIS — I639 Cerebral infarction, unspecified: Secondary | ICD-10-CM | POA: Diagnosis not present

## 2022-03-09 DIAGNOSIS — R4182 Altered mental status, unspecified: Secondary | ICD-10-CM | POA: Diagnosis not present

## 2022-03-10 DIAGNOSIS — R471 Dysarthria and anarthria: Secondary | ICD-10-CM | POA: Diagnosis not present

## 2022-03-10 DIAGNOSIS — G9341 Metabolic encephalopathy: Secondary | ICD-10-CM | POA: Diagnosis not present

## 2022-03-10 DIAGNOSIS — R4701 Aphasia: Secondary | ICD-10-CM | POA: Diagnosis not present

## 2022-03-10 DIAGNOSIS — N400 Enlarged prostate without lower urinary tract symptoms: Secondary | ICD-10-CM | POA: Diagnosis not present

## 2022-03-10 DIAGNOSIS — R4182 Altered mental status, unspecified: Secondary | ICD-10-CM | POA: Diagnosis not present

## 2022-03-11 ENCOUNTER — Ambulatory Visit: Payer: Medicare HMO | Admitting: Family Medicine

## 2022-03-14 ENCOUNTER — Encounter: Payer: Self-pay | Admitting: Family Medicine

## 2022-03-18 ENCOUNTER — Ambulatory Visit (INDEPENDENT_AMBULATORY_CARE_PROVIDER_SITE_OTHER): Payer: Medicare HMO

## 2022-03-18 DIAGNOSIS — J449 Chronic obstructive pulmonary disease, unspecified: Secondary | ICD-10-CM

## 2022-03-18 DIAGNOSIS — G9340 Encephalopathy, unspecified: Secondary | ICD-10-CM

## 2022-03-18 DIAGNOSIS — F0390 Unspecified dementia without behavioral disturbance: Secondary | ICD-10-CM

## 2022-03-18 DIAGNOSIS — I2510 Atherosclerotic heart disease of native coronary artery without angina pectoris: Secondary | ICD-10-CM | POA: Diagnosis not present

## 2022-03-18 DIAGNOSIS — J690 Pneumonitis due to inhalation of food and vomit: Secondary | ICD-10-CM

## 2022-03-18 DIAGNOSIS — I10 Essential (primary) hypertension: Secondary | ICD-10-CM | POA: Diagnosis not present

## 2022-03-18 DIAGNOSIS — I252 Old myocardial infarction: Secondary | ICD-10-CM

## 2022-04-06 ENCOUNTER — Encounter (INDEPENDENT_AMBULATORY_CARE_PROVIDER_SITE_OTHER): Payer: Medicare HMO | Admitting: Internal Medicine

## 2022-04-06 NOTE — Progress Notes (Signed)
Erroneous encounter - please disregard.

## 2022-04-26 ENCOUNTER — Ambulatory Visit: Payer: Medicare HMO | Admitting: Diagnostic Neuroimaging

## 2022-04-26 ENCOUNTER — Encounter: Payer: Self-pay | Admitting: Diagnostic Neuroimaging

## 2022-05-18 ENCOUNTER — Encounter: Payer: Self-pay | Admitting: Family Medicine

## 2022-05-18 ENCOUNTER — Ambulatory Visit (INDEPENDENT_AMBULATORY_CARE_PROVIDER_SITE_OTHER): Payer: Medicare HMO | Admitting: Family Medicine

## 2022-05-18 VITALS — BP 154/79 | HR 85 | Ht 73.0 in | Wt 196.0 lb

## 2022-05-18 DIAGNOSIS — Z8673 Personal history of transient ischemic attack (TIA), and cerebral infarction without residual deficits: Secondary | ICD-10-CM

## 2022-05-18 DIAGNOSIS — F01B3 Vascular dementia, moderate, with mood disturbance: Secondary | ICD-10-CM | POA: Diagnosis not present

## 2022-05-18 DIAGNOSIS — Z1211 Encounter for screening for malignant neoplasm of colon: Secondary | ICD-10-CM | POA: Diagnosis not present

## 2022-05-18 DIAGNOSIS — E782 Mixed hyperlipidemia: Secondary | ICD-10-CM

## 2022-05-18 DIAGNOSIS — F339 Major depressive disorder, recurrent, unspecified: Secondary | ICD-10-CM

## 2022-05-18 DIAGNOSIS — I10 Essential (primary) hypertension: Secondary | ICD-10-CM | POA: Diagnosis not present

## 2022-05-18 MED ORDER — CLOPIDOGREL BISULFATE 75 MG PO TABS: 75.0000 mg | ORAL_TABLET | Freq: Every day | ORAL | 1 refills | Status: DC

## 2022-05-18 MED ORDER — AMLODIPINE BESYLATE 5 MG PO TABS: 5.0000 mg | ORAL_TABLET | Freq: Every day | ORAL | 3 refills | Status: DC

## 2022-05-18 MED ORDER — TAMSULOSIN HCL 0.4 MG PO CAPS: 0.4000 mg | ORAL_CAPSULE | Freq: Every day | ORAL | 3 refills | Status: DC

## 2022-05-18 NOTE — Progress Notes (Signed)
BP (!) 154/79   Pulse 85   Ht 6' 1"$  (1.854 m)   Wt 196 lb (88.9 kg)   SpO2 97%   BMI 25.86 kg/m    Subjective:   Patient ID: Walter [name], male    DOB: [birth year], 72 y.o.   MRN: [id number]  HPI: Walter [name] is a 72 y.o. male presenting on 05/18/2022 for Medical Management of Chronic Issues, Hyperlipidemia, and Hypertension   HPI Hypertension Patient is currently on amlodipine, and their blood pressure today is 154/79 and 140/77. Patient denies any lightheadedness or dizziness. Patient denies headaches, blurred vision, chest pains, shortness of breath, or weakness. Denies any side effects from medication and is content with current medication.   Hyperlipidemia Patient is coming in for recheck of his hyperlipidemia. The patient is currently taking atorvastatin. They deny any issues with myalgias or history of liver damage from it. They deny any focal numbness or weakness or chest pain.   Vascular dementia with mood disorder and history of CVA Patient is coming in for recheck for vascular dementia history of CVA.  He still takes Plavix.  Seems to be stable.  His wife is here with him denies any major issue.  Relevant past medical, surgical, family and social history reviewed and updated as indicated. Interim medical history since our last visit reviewed. Allergies and medications reviewed and updated.  Review of Systems  Constitutional:  Negative for chills and fever.  Eyes:  Negative for visual disturbance.  Respiratory:  Negative for shortness of breath and wheezing.   Cardiovascular:  Negative for chest pain and leg swelling.  Musculoskeletal:  Negative for back pain and gait problem.  Skin:  Negative for rash.  Neurological:  Negative for dizziness, weakness and light-headedness.  All other systems reviewed and are negative.   Per HPI unless specifically indicated above   Allergies as of 05/18/2022   No Known Allergies      Medication List        Accurate as of  May 18, 2022  3:10 PM. If you have any questions, ask your nurse or doctor.          STOP taking these medications    losartan 25 MG tablet Commonly known as: COZAAR Stopped by: Worthy Rancher, MD       TAKE these medications    acetaminophen 325 MG tablet Commonly known as: TYLENOL Take 1-2 tablets (325-650 mg total) by mouth every 6 (six) hours as needed for mild pain (pain score 1-3).   amLODipine 5 MG tablet Commonly known as: NORVASC Take 1 tablet (5 mg total) by mouth daily.   aspirin EC 81 MG tablet Take 81 mg by mouth daily. Swallow whole.   atorvastatin 40 MG tablet Commonly known as: LIPITOR Take 1 tablet (40 mg total) by mouth daily.   clopidogrel 75 MG tablet Commonly known as: PLAVIX Take 1 tablet (75 mg total) by mouth daily.   ONE-A-DAY 50 PLUS PO Take by mouth daily.   saccharomyces boulardii 250 MG capsule Commonly known as: FLORASTOR Take 250 mg by mouth 2 (two) times daily.   tamsulosin 0.4 MG Caps capsule Commonly known as: FLOMAX Take 1 capsule (0.4 mg total) by mouth daily after breakfast.         Objective:   BP (!) 154/79   Pulse 85   Ht 6' 1"$  (1.854 m)   Wt 196 lb (88.9 kg)   SpO2 97%   BMI 25.86 kg/m  Wt Readings from Last 3 Encounters:  05/18/22 196 lb (88.9 kg)  02/28/22 190 lb 14.7 oz (86.6 kg)  11/15/21 191 lb (86.6 kg)    Physical Exam Vitals and nursing note reviewed.  Constitutional:      General: He is not in acute distress.    Appearance: He is well-developed. He is not diaphoretic.  Eyes:     General: No scleral icterus.    Conjunctiva/sclera: Conjunctivae normal.  Neck:     Thyroid: No thyromegaly.  Cardiovascular:     Rate and Rhythm: Normal rate and regular rhythm.     Heart sounds: Normal heart sounds. No murmur heard. Pulmonary:     Effort: Pulmonary effort is normal. No respiratory distress.     Breath sounds: Normal breath sounds. No wheezing.  Musculoskeletal:        General: No  swelling. Normal range of motion.     Cervical back: Neck supple.  Lymphadenopathy:     Cervical: No cervical adenopathy.  Skin:    General: Skin is warm and dry.     Findings: No rash.  Neurological:     Mental Status: He is alert and oriented to person, place, and time.     Coordination: Coordination normal.  Psychiatric:        Behavior: Behavior normal.       Assessment & Plan:   Problem List Items Addressed This Visit       Cardiovascular and Mediastinum   HTN (hypertension)   Relevant Medications   amLODipine (NORVASC) 5 MG tablet     Nervous and Auditory   Vascular dementia (HCC)     Other   Depression, recurrent (HCC)   Hyperlipidemia, mixed   Relevant Medications   amLODipine (NORVASC) 5 MG tablet   History of CVA (cerebrovascular accident)   Relevant Medications   clopidogrel (PLAVIX) 75 MG tablet   Other Visit Diagnoses     Colon cancer screening    -  Primary   Relevant Orders   Cologuard     Continue current medicine, no changes, refilled his medicines.  Follow up plan: Return in about 6 months (around 11/16/2022), or if symptoms worsen or fail to improve, for Hypertension and hyperlipidemia and vascular dementia.  Counseling provided for all of the vaccine components Orders Placed This Encounter  Procedures   Cologuard    Caryl Pina, MD [facility] 05/18/2022, 3:10 PM

## 2022-05-18 NOTE — Addendum Note (Signed)
Addended by: Caryl Pina on: 05/18/2022 03:17 PM   Modules accepted: Orders

## 2022-05-19 LAB — CBC WITH DIFFERENTIAL/PLATELET
Basophils Absolute: 0.1 10*3/uL (ref 0.0–0.2)
Basos: 1 %
EOS (ABSOLUTE): 0.4 10*3/uL (ref 0.0–0.4)
Eos: 4 %
Hematocrit: 44.4 % (ref 37.5–51.0)
Hemoglobin: 15.1 g/dL (ref 13.0–17.7)
Immature Grans (Abs): 0.1 10*3/uL (ref 0.0–0.1)
Immature Granulocytes: 1 %
Lymphocytes Absolute: 2 10*3/uL (ref 0.7–3.1)
Lymphs: 21 %
MCH: 30.4 pg (ref 26.6–33.0)
MCHC: 34 g/dL (ref 31.5–35.7)
MCV: 89 fL (ref 79–97)
Monocytes Absolute: 1 10*3/uL — ABNORMAL HIGH (ref 0.1–0.9)
Monocytes: 11 %
Neutrophils Absolute: 5.9 10*3/uL (ref 1.4–7.0)
Neutrophils: 62 %
Platelets: 249 10*3/uL (ref 150–450)
RBC: 4.97 x10E6/uL (ref 4.14–5.80)
RDW: 13.2 % (ref 11.6–15.4)
WBC: 9.4 10*3/uL (ref 3.4–10.8)

## 2022-05-19 LAB — CMP14+EGFR
ALT: 20 IU/L (ref 0–44)
AST: 14 IU/L (ref 0–40)
Albumin/Globulin Ratio: 1.4 (ref 1.2–2.2)
Albumin: 4.2 g/dL (ref 3.8–4.8)
Alkaline Phosphatase: 112 IU/L (ref 44–121)
BUN/Creatinine Ratio: 13 (ref 10–24)
BUN: 12 mg/dL (ref 8–27)
Bilirubin Total: 0.5 mg/dL (ref 0.0–1.2)
CO2: 20 mmol/L (ref 20–29)
Calcium: 9.4 mg/dL (ref 8.6–10.2)
Chloride: 105 mmol/L (ref 96–106)
Creatinine, Ser: 0.92 mg/dL (ref 0.76–1.27)
Globulin, Total: 2.9 g/dL (ref 1.5–4.5)
Glucose: 77 mg/dL (ref 70–99)
Potassium: 4 mmol/L (ref 3.5–5.2)
Sodium: 141 mmol/L (ref 134–144)
Total Protein: 7.1 g/dL (ref 6.0–8.5)
eGFR: 88 mL/min/{1.73_m2} (ref >59)

## 2022-05-19 LAB — LIPID PANEL
Chol/HDL Ratio: 4.5 ratio (ref 0.0–5.0)
Cholesterol, Total: 136 mg/dL (ref 100–199)
HDL: 30 mg/dL — ABNORMAL LOW (ref >39)
LDL Chol Calc (NIH): 76 mg/dL (ref 0–99)
Triglycerides: 176 mg/dL — ABNORMAL HIGH (ref 0–149)
VLDL Cholesterol Cal: 30 mg/dL (ref 5–40)

## 2022-06-03 ENCOUNTER — Other Ambulatory Visit: Payer: Self-pay | Admitting: Family Medicine

## 2022-06-03 DIAGNOSIS — E782 Mixed hyperlipidemia: Secondary | ICD-10-CM

## 2022-06-03 DIAGNOSIS — I10 Essential (primary) hypertension: Secondary | ICD-10-CM

## 2022-06-03 NOTE — Addendum Note (Signed)
Addended by: Antonietta Barcelona D on: 06/03/2022 10:43 AM   Modules accepted: Orders

## 2022-06-06 MED ORDER — TAMSULOSIN HCL 0.4 MG PO CAPS: 0.4000 mg | ORAL_CAPSULE | Freq: Every day | ORAL | 2 refills | Status: DC

## 2022-06-06 NOTE — Addendum Note (Signed)
Addended by: Antonietta Barcelona D on: 06/06/2022 10:34 AM   Modules accepted: Orders

## 2022-11-23 ENCOUNTER — Ambulatory Visit (INDEPENDENT_AMBULATORY_CARE_PROVIDER_SITE_OTHER): Payer: Medicare HMO | Admitting: Family Medicine

## 2022-11-23 ENCOUNTER — Encounter: Payer: Self-pay | Admitting: Family Medicine

## 2022-11-23 VITALS — BP 138/76 | HR 81 | Ht 73.0 in | Wt 186.0 lb

## 2022-11-23 DIAGNOSIS — J449 Chronic obstructive pulmonary disease, unspecified: Secondary | ICD-10-CM | POA: Diagnosis not present

## 2022-11-23 DIAGNOSIS — F339 Major depressive disorder, recurrent, unspecified: Secondary | ICD-10-CM | POA: Diagnosis not present

## 2022-11-23 DIAGNOSIS — F01B3 Vascular dementia, moderate, with mood disturbance: Secondary | ICD-10-CM

## 2022-11-23 DIAGNOSIS — Z8673 Personal history of transient ischemic attack (TIA), and cerebral infarction without residual deficits: Secondary | ICD-10-CM

## 2022-11-23 DIAGNOSIS — E782 Mixed hyperlipidemia: Secondary | ICD-10-CM | POA: Diagnosis not present

## 2022-11-23 DIAGNOSIS — R6889 Other general symptoms and signs: Secondary | ICD-10-CM | POA: Diagnosis not present

## 2022-11-23 DIAGNOSIS — I10 Essential (primary) hypertension: Secondary | ICD-10-CM

## 2022-11-23 DIAGNOSIS — F0153 Vascular dementia, unspecified severity, with mood disturbance: Secondary | ICD-10-CM | POA: Diagnosis not present

## 2022-11-23 MED ORDER — CLOPIDOGREL BISULFATE 75 MG PO TABS: 75.0000 mg | ORAL_TABLET | Freq: Every day | ORAL | 1 refills | Status: DC

## 2022-11-23 MED ORDER — ATORVASTATIN CALCIUM 40 MG PO TABS: 40.0000 mg | ORAL_TABLET | Freq: Every day | ORAL | 1 refills | Status: DC

## 2022-11-23 MED ORDER — AMLODIPINE BESYLATE 5 MG PO TABS: 5.0000 mg | ORAL_TABLET | Freq: Every day | ORAL | 3 refills | Status: AC

## 2022-11-23 MED ORDER — TAMSULOSIN HCL 0.4 MG PO CAPS: 0.4000 mg | ORAL_CAPSULE | Freq: Every day | ORAL | 1 refills | Status: DC

## 2022-11-23 NOTE — Progress Notes (Signed)
BP 138/76   Pulse 81   Ht 6\' 1"  (1.854 m)   Wt 186 lb (84.4 kg)   SpO2 97%   BMI 24.54 kg/m    Subjective:   Patient ID: Walter [name], male    DOB: 1950/08/21, 72 y.o.   MRN: [id number]  HPI: Walter [name] is a 72 y.o. male presenting on 11/23/2022 for Medical Management of Chronic Issues, Hyperlipidemia, Hypertension, and Dementia   HPI Hypertension Patient is currently on amlodipine, and their blood pressure today is 138/76. Patient denies any lightheadedness or dizziness. Patient denies headaches, blurred vision, chest pains, shortness of breath, or weakness. Denies any side effects from medication and is content with current medication.   Hyperlipidemia Patient is coming in for recheck of his hyperlipidemia. The patient is currently taking atorvastatin. They deny any issues with myalgias or history of liver damage from it. They deny any focal numbness or weakness or chest pain.   Vascular dementia with behavioral disturbance and depression Patient is coming in for recheck for vascular dementia with behavior disturbance and depression and history of CVA.  He is currently taking clopidogrel for this.  He also takes 81 mg aspirin.  He does not have any memory medicines currently.  Relevant past medical, surgical, family and social history reviewed and updated as indicated. Interim medical history since our last visit reviewed. Allergies and medications reviewed and updated.  Review of Systems  Constitutional:  Negative for chills and fever.  Eyes:  Negative for visual disturbance.  Respiratory:  Negative for shortness of breath and wheezing.   Cardiovascular:  Positive for leg swelling. Negative for chest pain.  Skin:  Negative for rash.  Neurological:  Positive for speech difficulty. Negative for dizziness, weakness and light-headedness.  Psychiatric/Behavioral:  Positive for confusion and decreased concentration. Negative for self-injury, sleep disturbance and suicidal ideas.    All other systems reviewed and are negative.   Per HPI unless specifically indicated above   Allergies as of 11/23/2022   No Known Allergies      Medication List        Accurate as of November 23, 2022  3:18 PM. If you have any questions, ask your nurse or doctor.          acetaminophen 325 MG tablet Commonly known as: TYLENOL Take 1-2 tablets (325-650 mg total) by mouth every 6 (six) hours as needed for mild pain (pain score 1-3).   amLODipine 5 MG tablet Commonly known as: NORVASC Take 1 tablet (5 mg total) by mouth daily.   aspirin EC 81 MG tablet Take 81 mg by mouth daily. Swallow whole.   atorvastatin 40 MG tablet Commonly known as: LIPITOR Take 1 tablet (40 mg total) by mouth daily.   clopidogrel 75 MG tablet Commonly known as: PLAVIX Take 1 tablet (75 mg total) by mouth daily.   ONE-A-DAY 50 PLUS PO Take by mouth daily.   saccharomyces boulardii 250 MG capsule Commonly known as: FLORASTOR Take 250 mg by mouth 2 (two) times daily.   tamsulosin 0.4 MG Caps capsule Commonly known as: FLOMAX Take 1 capsule (0.4 mg total) by mouth daily after breakfast.         Objective:   BP 138/76   Pulse 81   Ht 6\' 1"  (1.854 m)   Wt 186 lb (84.4 kg)   SpO2 97%   BMI 24.54 kg/m   Wt Readings from Last 3 Encounters:  11/23/22 186 lb (84.4 kg)  05/18/22 196 lb (  88.9 kg)  02/28/22 190 lb 14.7 oz (86.6 kg)    Physical Exam Vitals and nursing note reviewed.  Constitutional:      General: He is not in acute distress.    Appearance: He is well-developed. He is not diaphoretic.  Eyes:     General: No scleral icterus.    Conjunctiva/sclera: Conjunctivae normal.  Neck:     Thyroid: No thyromegaly.  Cardiovascular:     Rate and Rhythm: Normal rate and regular rhythm.     Heart sounds: Normal heart sounds. No murmur heard. Pulmonary:     Effort: Pulmonary effort is normal. No respiratory distress.     Breath sounds: Normal breath sounds. No wheezing.   Musculoskeletal:        General: Swelling (trace bilateral lower extremity) present. Normal range of motion.     Cervical back: Neck supple.  Lymphadenopathy:     Cervical: No cervical adenopathy.  Skin:    General: Skin is warm and dry.     Findings: No rash.  Neurological:     Mental Status: He is alert and oriented to person, place, and time.     Coordination: Coordination normal.  Psychiatric:        Behavior: Behavior normal.       Assessment & Plan:   Problem List Items Addressed This Visit       Cardiovascular and Mediastinum   HTN (hypertension)   Relevant Medications   amLODipine (NORVASC) 5 MG tablet   atorvastatin (LIPITOR) 40 MG tablet   Other Relevant Orders   CMP14+EGFR     Nervous and Auditory   Vascular dementia (HCC) - Primary   Relevant Orders   CBC with Differential/Platelet   CMP14+EGFR   Lipid panel     Other   Depression, recurrent (HCC)   Relevant Orders   CBC with Differential/Platelet   Hyperlipidemia, mixed   Relevant Medications   amLODipine (NORVASC) 5 MG tablet   atorvastatin (LIPITOR) 40 MG tablet   Other Relevant Orders   Lipid panel   History of CVA (cerebrovascular accident)   Relevant Medications   clopidogrel (PLAVIX) 75 MG tablet    Continue current medicine, seems to be stable.  Is a little bit of congestion but she reports that is been going on for few weeks recommended Mucinex and an antihistamine such as Zyrtec Follow up plan: Return in about 6 months (around 05/26/2023), or if symptoms worsen or fail to improve, for Hypertension and hyperlipidemia and dementia.  Counseling provided for all of the vaccine components Orders Placed This Encounter  Procedures   CBC with Differential/Platelet   CMP14+EGFR   Lipid panel    Arville Care, MD [facility] 11/23/2022, 3:18 PM

## 2022-11-24 LAB — CMP14+EGFR
ALT: 14 IU/L (ref 0–44)
AST: 13 IU/L (ref 0–40)
Albumin: 4.5 g/dL (ref 3.8–4.8)
Alkaline Phosphatase: 116 IU/L (ref 44–121)
BUN/Creatinine Ratio: 15 (ref 10–24)
BUN: 13 mg/dL (ref 8–27)
Bilirubin Total: 0.5 mg/dL (ref 0.0–1.2)
CO2: 20 mmol/L (ref 20–29)
Calcium: 9.8 mg/dL (ref 8.6–10.2)
Chloride: 103 mmol/L (ref 96–106)
Creatinine, Ser: 0.87 mg/dL (ref 0.76–1.27)
Globulin, Total: 2.6 g/dL (ref 1.5–4.5)
Glucose: 106 mg/dL — ABNORMAL HIGH (ref 70–99)
Potassium: 4 mmol/L (ref 3.5–5.2)
Sodium: 139 mmol/L (ref 134–144)
Total Protein: 7.1 g/dL (ref 6.0–8.5)
eGFR: 91 mL/min/{1.73_m2} (ref >59)

## 2022-11-24 LAB — CBC WITH DIFFERENTIAL/PLATELET
Basophils Absolute: 0.1 10*3/uL (ref 0.0–0.2)
Basos: 1 %
EOS (ABSOLUTE): 0.3 10*3/uL (ref 0.0–0.4)
Eos: 3 %
Hematocrit: 47.5 % (ref 37.5–51.0)
Hemoglobin: 16.1 g/dL (ref 13.0–17.7)
Immature Grans (Abs): 0 10*3/uL (ref 0.0–0.1)
Immature Granulocytes: 1 %
Lymphocytes Absolute: 2.2 10*3/uL (ref 0.7–3.1)
Lymphs: 27 %
MCH: 29.9 pg (ref 26.6–33.0)
MCHC: 33.9 g/dL (ref 31.5–35.7)
MCV: 88 fL (ref 79–97)
Monocytes Absolute: 0.8 10*3/uL (ref 0.1–0.9)
Monocytes: 10 %
Neutrophils Absolute: 4.6 10*3/uL (ref 1.4–7.0)
Neutrophils: 58 %
Platelets: 251 10*3/uL (ref 150–450)
RBC: 5.39 x10E6/uL (ref 4.14–5.80)
RDW: 13.1 % (ref 11.6–15.4)
WBC: 8 10*3/uL (ref 3.4–10.8)

## 2022-11-24 LAB — LIPID PANEL
Chol/HDL Ratio: 4.1 ratio (ref 0.0–5.0)
Cholesterol, Total: 150 mg/dL (ref 100–199)
HDL: 37 mg/dL — ABNORMAL LOW (ref >39)
LDL Chol Calc (NIH): 93 mg/dL (ref 0–99)
Triglycerides: 107 mg/dL (ref 0–149)
VLDL Cholesterol Cal: 20 mg/dL (ref 5–40)

## 2023-01-06 ENCOUNTER — Other Ambulatory Visit: Payer: Self-pay | Admitting: Family Medicine

## 2023-01-06 DIAGNOSIS — Z1212 Encounter for screening for malignant neoplasm of rectum: Secondary | ICD-10-CM

## 2023-01-06 DIAGNOSIS — Z1211 Encounter for screening for malignant neoplasm of colon: Secondary | ICD-10-CM

## 2023-04-06 ENCOUNTER — Ambulatory Visit: Payer: Medicare HMO

## 2023-04-06 VITALS — Ht 73.0 in | Wt 186.0 lb

## 2023-04-06 DIAGNOSIS — Z0000 Encounter for general adult medical examination without abnormal findings: Secondary | ICD-10-CM | POA: Diagnosis not present

## 2023-04-06 NOTE — Patient Instructions (Signed)
 Mr. Walter [name] , Thank you for taking time to come for your Medicare Wellness Visit. I appreciate your ongoing commitment to your health goals. Please review the following plan we discussed and let me know if I can assist you in the future.   Referrals/Orders/Follow-Ups/Clinician Recommendations: Aim for 30 minutes of exercise or brisk walking, 6-8 glasses of water, and 5 servings of fruits and vegetables each day.  This is a list of the screening recommended for you and due dates:  Health Maintenance  Topic Date Due   COVID-19 Vaccine (1) Never done   DTaP/Tdap/Td vaccine (1 - Tdap) Never done   Flu Shot  Never done   Screening for Lung Cancer  05/19/2023*   Pneumonia Vaccine (1 of 2 - PCV) 05/19/2023*   Colon Cancer Screening  05/19/2023*   Hepatitis C Screening  05/19/2023*   Zoster (Shingles) Vaccine (1 of 2) 02/23/2024*   Medicare Annual Wellness Visit  04/05/2024   HPV Vaccine  Aged Out  *Topic was postponed. The date shown is not the original due date.    Advanced directives: (ACP Link)Information on Advanced Care Planning can be found at [place]  Secretary of [facility] Advance Health Care Directives Advance Health Care Directives ([email or web address]   Next Medicare Annual Wellness Visit scheduled for next year: Yes

## 2023-04-06 NOTE — Progress Notes (Signed)
 Subjective:   Walter [name] is a 73 y.o. male who presents for Medicare Annual/Subsequent preventive examination.  Visit Complete: Virtual I connected with  Walter [name] on 04/06/23 by a audio enabled telemedicine application and verified that I am speaking with the correct person using two identifiers.  Patient Location: Home  Provider Location: Home Office  This patient declined Interactive audio and video telecommunications. Therefore the visit was completed with audio only.  I discussed the limitations of evaluation and management by telemedicine. The patient expressed understanding and agreed to proceed.  Vital Signs: Because this visit was a virtual/telehealth visit, some criteria may be missing or patient reported. Any vitals not documented were not able to be obtained and vitals that have been documented are patient reported.  Cardiac Risk Factors include: advanced age (>53men, >45 women);male gender;hypertension;dyslipidemia;sedentary lifestyle;smoking/ tobacco exposure     Objective:    Today's Vitals   04/06/23 1643  Weight: 186 lb (84.4 kg)  Height: 6' 1 (1.854 m)   Body mass index is 24.54 kg/m.     04/06/2023    4:45 PM 02/25/2022    4:01 PM 02/25/2022    3:34 AM 08/24/2021    1:33 PM 08/21/2020    2:53 PM 09/07/2019    4:00 PM 03/21/2019    1:15 PM  Advanced Directives  Does Patient Have a Medical Advance Directive? No No No No Yes No No  Type of Agricultural Consultant;Living will    Copy of Healthcare Power of Attorney in Chart?     No - copy requested    Would patient like information on creating a medical advance directive? Yes (MAU/Ambulatory/Procedural Areas - Information given) No - Patient declined  No - Patient declined  No - Patient declined     Current Medications (verified) Outpatient Encounter Medications as of 04/06/2023  Medication Sig   acetaminophen  (TYLENOL ) 325 MG tablet Take 1-2 tablets (325-650 mg total) by  mouth every 6 (six) hours as needed for mild pain (pain score 1-3).   amLODipine  (NORVASC ) 5 MG tablet Take 1 tablet (5 mg total) by mouth daily.   aspirin  EC 81 MG tablet Take 81 mg by mouth daily. Swallow whole.   atorvastatin  (LIPITOR) 40 MG tablet Take 1 tablet (40 mg total) by mouth daily.   clopidogrel  (PLAVIX ) 75 MG tablet Take 1 tablet (75 mg total) by mouth daily.   Multiple Vitamins-Minerals (ONE-A-DAY 50 PLUS PO) Take by mouth daily.   saccharomyces boulardii (FLORASTOR) 250 MG capsule Take 250 mg by mouth 2 (two) times daily.   tamsulosin  (FLOMAX ) 0.4 MG CAPS capsule Take 1 capsule (0.4 mg total) by mouth daily after breakfast.   No facility-administered encounter medications on file as of 04/06/2023.    Allergies (verified) Patient has no known allergies.   History: Past Medical History:  Diagnosis Date   Acute urinary retention 02/12/2014   Brain TIA    recurrent    C. difficile enteritis    Carotid bruit    COPD (chronic obstructive pulmonary disease) (HCC)    Coronary artery disease    Myocardial infarction (HCC)    incidental   Nodule of kidney 02/14/14   solid on left   Recurrent colitis due to Clostridium difficile 02/11/2014   Stroke (HCC) 1997   Past Surgical History:  Procedure Laterality Date   BACK SURGERY     HIP ARTHROPLASTY Left 09/09/2019   Procedure: ARTHROPLASTY BIPOLAR HIP (HEMIARTHROPLASTY);  Surgeon: Ernie Cough, MD;  [facility];  Service: Orthopedics;  Laterality: Left;   KNEE SURGERY     Family History  Problem Relation Age of Onset   Cancer Father 33       stomach   Coronary artery disease Mother 52   Stroke Mother    Coronary artery disease Brother 6       AMI deceased   Heart attack Daughter    Social History   Socioeconomic History   Marital status: Married    Spouse name: Not on file   Number of children: 1   Years of education: 10   Highest education level: 10th grade  Occupational History   Occupation: truck  hospital doctor    Comment: unable becasue of CVA   Occupation: disability    Comment: since 2000  Tobacco Use   Smoking status: Former    Current packs/day: 1.00    Average packs/day: 1 pack/day for 55.0 years (55.0 ttl pk-yrs)    Types: Cigarettes   Smokeless tobacco: Never  Vaping Use   Vaping status: Never Used  Substance and Sexual Activity   Alcohol use: No   Drug use: No   Sexual activity: Not Currently  Other Topics Concern   Not on file  Social History Narrative   One level living and handicap accessible bathroom - had stroke in 2021 - speech impaired, impaired mobility   Social Drivers of Corporate Investment Banker Strain: Low Risk  (04/06/2023)   Overall Financial Resource Strain (CARDIA)    Difficulty of Paying Living Expenses: Not hard at all  Food Insecurity: No Food Insecurity (04/06/2023)   Hunger Vital Sign    Worried About Running Out of Food in the Last Year: Never true    Ran Out of Food in the Last Year: Never true  Transportation Needs: No Transportation Needs (04/06/2023)   PRAPARE - Administrator, Civil Service (Medical): No    Lack of Transportation (Non-Medical): No  Physical Activity: Inactive (04/06/2023)   Exercise Vital Sign    Days of Exercise per Week: 0 days    Minutes of Exercise per Session: 0 min  Stress: No Stress Concern Present (04/06/2023)   [employer] of Occupational Health - Occupational Stress Questionnaire    Feeling of Stress : Only a little  Social Connections: Socially Isolated (04/06/2023)   Social Connection and Isolation Panel [NHANES]    Frequency of Communication with Friends and Family: Never    Frequency of Social Gatherings with Friends and Family: Twice a week    Attends Religious Services: Never    Database Administrator or Organizations: No    Attends Engineer, Structural: Never    Marital Status: Married    Tobacco Counseling Counseling given: Not Answered   Clinical Intake:  Pre-visit  preparation completed: Yes  Pain : No/denies pain     Diabetes: No  How often do you need to have someone help you when you read instructions, pamphlets, or other written materials from your doctor or pharmacy?: 1 - Never  Interpreter Needed?: No  Information entered by :: Charmaine Bloodgood LPN   Activities of Daily Living    04/06/2023    4:44 PM  In your present state of health, do you have any difficulty performing the following activities:  Hearing? 0  Vision? 0  Difficulty concentrating or making decisions? 0  Walking or climbing stairs? 0  Dressing or bathing? 0  Doing errands,  shopping? 0  Preparing Food and eating ? N  Using the Toilet? N  In the past six months, have you accidently leaked urine? N  Do you have problems with loss of bowel control? N  Managing your Medications? N  Managing your Finances? N  Housekeeping or managing your Housekeeping? N    Patient Care Team: Dettinger, Fonda LABOR, MD as PCP - General (Family Medicine) Shaaron Lamar HERO, MD as Consulting Physician (Gastroenterology) Frances Ozell RAMAN, LCSW as Social Worker (Licensed Clinical Social Worker) Shona Rush, MD (Dermatology) Nicholaus Sherlyn CROME, NP as Nurse Practitioner (Urology)  Indicate any recent Medical Services you may have received from other than [facility] providers in the past year (date may be approximate).     Assessment:   This is a routine wellness examination for [place].  Hearing/Vision screen Hearing Screening - Comments:: Some hearing loss Vision Screening - Comments:: No vision problems; does not want to see eye doctor     Goals Addressed   None   Depression Screen    04/06/2023    4:47 PM 11/23/2022    2:54 PM 05/18/2022    2:47 PM 11/15/2021    1:06 PM 08/24/2021    1:31 PM 05/17/2021    1:14 PM 05/11/2021   11:44 AM  PHQ 2/9 Scores  PHQ - 2 Score 2 4 5 5 4 4 2   PHQ- 9 Score 6 11 19 22 17 19 10     Fall Risk    04/06/2023    4:49 PM 11/23/2022    2:53 PM 05/18/2022    2:47  PM 11/15/2021    1:06 PM 08/24/2021    1:20 PM  Fall Risk   Falls in the past year? 0 1 1 1 1   Number falls in past yr: 0 0 0 1 1  Injury with Fall? 0 0 0 0 0  Risk for fall due to : Impaired balance/gait;Impaired mobility Impaired balance/gait Impaired balance/gait Impaired balance/gait;Impaired mobility;History of fall(s) History of fall(s);Impaired balance/gait;Orthopedic patient;Mental status change  Follow up Falls prevention discussed;Education provided;Falls evaluation completed Falls evaluation completed Falls evaluation completed Falls evaluation completed Education provided;Falls prevention discussed    MEDICARE RISK AT HOME: Medicare Risk at Home Any stairs in or around the home?: No If so, are there any without handrails?: No Home free of loose throw rugs in walkways, pet beds, electrical cords, etc?: Yes Adequate lighting in your home to reduce risk of falls?: Yes Life alert?: No Use of a cane, walker or w/c?: Yes Grab bars in the bathroom?: Yes Shower chair or bench in shower?: Yes Elevated toilet seat or a handicapped toilet?: Yes  TIMED UP AND GO:  Was the test performed?  No    Cognitive Function:    08/24/2021    1:36 PM 12/14/2017    4:26 PM  MMSE - Mini Mental State Exam  Not completed: Unable to complete   Orientation to time  4  Orientation to Place  4  Registration  3  Attention/ Calculation  5  Recall  2  Language- name 2 objects  2  Language- repeat  1  Language- follow 3 step command  3  Language- read & follow direction  1  Write a sentence  1  Copy design  1  Total score  27        04/06/2023    4:49 PM  6CIT Screen  What Year? 4 points  What month? 0 points  What time? 0 points  Count back from 20 0 points  Months in reverse 2 points  Repeat phrase 8 points  Total Score 14 points    Immunizations There is no immunization history for the selected administration types on file for this patient.  TDAP status: Due, Education has been  provided regarding the importance of this vaccine. Advised may receive this vaccine at local pharmacy or Health Dept. Aware to provide a copy of the vaccination record if obtained from local pharmacy or Health Dept. Verbalized acceptance and understanding.  Flu Vaccine status: Declined, Education has been provided regarding the importance of this vaccine but patient still declined. Advised may receive this vaccine at local pharmacy or Health Dept. Aware to provide a copy of the vaccination record if obtained from local pharmacy or Health Dept. Verbalized acceptance and understanding.  Pneumococcal vaccine status: Declined,  Education has been provided regarding the importance of this vaccine but patient still declined. Advised may receive this vaccine at local pharmacy or Health Dept. Aware to provide a copy of the vaccination record if obtained from local pharmacy or Health Dept. Verbalized acceptance and understanding.   Covid-19 vaccine status: Declined, Education has been provided regarding the importance of this vaccine but patient still declined. Advised may receive this vaccine at local pharmacy or Health Dept.or vaccine clinic. Aware to provide a copy of the vaccination record if obtained from local pharmacy or Health Dept. Verbalized acceptance and understanding.  Qualifies for Shingles Vaccine? Yes   Zostavax completed No   Shingrix Completed?: No.    Education has been provided regarding the importance of this vaccine. Patient has been advised to call insurance company to determine out of pocket expense if they have not yet received this vaccine. Advised may also receive vaccine at local pharmacy or Health Dept. Verbalized acceptance and understanding.  Screening Tests Health Maintenance  Topic Date Due   COVID-19 Vaccine (1) Never done   DTaP/Tdap/Td (1 - Tdap) Never done   INFLUENZA VACCINE  Never done   Lung Cancer Screening  05/19/2023 (Originally 09/13/1998)   Pneumonia Vaccine 63+  Years old (1 of 2 - PCV) 05/19/2023 (Originally 09/13/1956)   Colonoscopy  05/19/2023 (Originally 09/12/1993)   Hepatitis C Screening  05/19/2023 (Originally 09/13/1966)   Zoster Vaccines- Shingrix (1 of 2) 02/23/2024 (Originally 09/13/1967)   Medicare Annual Wellness (AWV)  04/05/2024   HPV VACCINES  Aged Out    Health Maintenance  Health Maintenance Due  Topic Date Due   COVID-19 Vaccine (1) Never done   DTaP/Tdap/Td (1 - Tdap) Never done   INFLUENZA VACCINE  Never done    Colorectal cancer screening:  Patient declines at this time   Lung Cancer Screening: (Low Dose CT Chest recommended if Age 76-80 years, 20 pack-year currently smoking OR have quit w/in 15years.) does qualify.   Lung Cancer Screening Referral: Patient declines   Additional Screening:  Hepatitis C Screening: does qualify  Vision Screening: Recommended annual ophthalmology exams for early detection of glaucoma and other disorders of the eye. Is the patient up to date with their annual eye exam?  No  Who is the provider or what is the name of the office in which the patient attends annual eye exams? none If pt is not established with a provider, would they like to be referred to a provider to establish care? No .   Dental Screening: Recommended annual dental exams for proper oral hygiene  Community Resource Referral / Chronic Care Management: CRR required this visit?  No  CCM required this visit?  No     Plan:     I have personally reviewed and noted the following in the patient's chart:   Medical and social history Use of alcohol, tobacco or illicit drugs  Current medications and supplements including opioid prescriptions. Patient is not currently taking opioid prescriptions. Functional ability and status Nutritional status Physical activity Advanced directives List of other physicians Hospitalizations, surgeries, and ER visits in previous 12 months Vitals Screenings to include cognitive,  depression, and falls Referrals and appointments  In addition, I have reviewed and discussed with patient certain preventive protocols, quality metrics, and best practice recommendations. A written personalized care plan for preventive services as well as general preventive health recommendations were provided to patient.     Lavelle Pfeiffer [place], [place]   11/08/7972   After Visit Summary: (Mail) Due to this being a telephonic visit, the after visit summary with patients personalized plan was offered to patient via mail   Nurse Notes: No concerns at this time

## 2023-04-26 ENCOUNTER — Other Ambulatory Visit: Payer: Self-pay | Admitting: Family Medicine

## 2023-04-26 DIAGNOSIS — Z8673 Personal history of transient ischemic attack (TIA), and cerebral infarction without residual deficits: Secondary | ICD-10-CM

## 2023-04-26 DIAGNOSIS — E782 Mixed hyperlipidemia: Secondary | ICD-10-CM

## 2023-05-23 ENCOUNTER — Telehealth: Payer: Self-pay | Admitting: Family Medicine

## 2023-05-23 NOTE — Telephone Encounter (Signed)
Copied from CRM [id number]. Topic: Clinical - Medical Advice >> May 23, 2023 10:10 AM Shelah Lewandowsky wrote: Reason for CRM: wife is very sick and can't take care of the patient, can't get him out of this chair to take care of him, has already had to call 911 to pick him up off the floor, please call [phone]

## 2023-05-24 NOTE — Telephone Encounter (Signed)
Wife was seen at ER last night and has Covid. She is not feeling well enough to do a video or in person visit but will call back to schedule appt when feeling better. Doesn't know how to do video visit, so she would need help from a family member when she feels better.

## 2023-05-24 NOTE — Telephone Encounter (Signed)
Let her know that we may need to discuss him going into the facility, can discuss options, they can do a virtual visit, I have availability today if they would like.

## 2023-05-26 ENCOUNTER — Ambulatory Visit: Payer: Self-pay | Admitting: Family Medicine

## 2023-05-26 ENCOUNTER — Ambulatory Visit: Payer: Medicare HMO | Admitting: Family Medicine

## 2023-05-26 ENCOUNTER — Other Ambulatory Visit: Payer: Self-pay

## 2023-05-26 ENCOUNTER — Emergency Department (HOSPITAL_COMMUNITY): Payer: Medicare HMO

## 2023-05-26 ENCOUNTER — Encounter (HOSPITAL_COMMUNITY): Payer: Self-pay | Admitting: *Deleted

## 2023-05-26 ENCOUNTER — Emergency Department (HOSPITAL_COMMUNITY)
Admission: EM | Admit: 2023-05-26 | Discharge: 2023-05-26 | Disposition: A | Discharge status: Home / Self Care | Payer: Medicare HMO | Attending: Emergency Medicine | Admitting: Emergency Medicine

## 2023-05-26 DIAGNOSIS — U071 COVID-19: Secondary | ICD-10-CM | POA: Insufficient documentation

## 2023-05-26 DIAGNOSIS — R531 Weakness: Secondary | ICD-10-CM | POA: Diagnosis not present

## 2023-05-26 DIAGNOSIS — Z7902 Long term (current) use of antithrombotics/antiplatelets: Secondary | ICD-10-CM | POA: Insufficient documentation

## 2023-05-26 DIAGNOSIS — R5381 Other malaise: Secondary | ICD-10-CM | POA: Diagnosis not present

## 2023-05-26 DIAGNOSIS — I10 Essential (primary) hypertension: Secondary | ICD-10-CM | POA: Diagnosis not present

## 2023-05-26 DIAGNOSIS — Z7982 Long term (current) use of aspirin: Secondary | ICD-10-CM | POA: Insufficient documentation

## 2023-05-26 DIAGNOSIS — Z8673 Personal history of transient ischemic attack (TIA), and cerebral infarction without residual deficits: Secondary | ICD-10-CM | POA: Diagnosis not present

## 2023-05-26 DIAGNOSIS — R059 Cough, unspecified: Secondary | ICD-10-CM | POA: Diagnosis not present

## 2023-05-26 LAB — COMPREHENSIVE METABOLIC PANEL
ALT: 21 U/L (ref 0–44)
AST: 20 U/L (ref 15–41)
Albumin: 3.8 g/dL (ref 3.5–5.0)
Alkaline Phosphatase: 78 U/L (ref 38–126)
Anion gap: 9 (ref 5–15)
BUN: 18 mg/dL (ref 8–23)
CO2: 24 mmol/L (ref 22–32)
Calcium: 9 mg/dL (ref 8.9–10.3)
Chloride: 109 mmol/L (ref 98–111)
Creatinine, Ser: 0.98 mg/dL (ref 0.61–1.24)
GFR, Estimated: >60 mL/min (ref >60)
Glucose, Bld: 117 mg/dL — ABNORMAL HIGH (ref 70–99)
Potassium: 4.4 mmol/L (ref 3.5–5.1)
Sodium: 142 mmol/L (ref 135–145)
Total Bilirubin: 0.8 mg/dL (ref 0.0–1.2)
Total Protein: 7.6 g/dL (ref 6.5–8.1)

## 2023-05-26 LAB — CBC WITH DIFFERENTIAL/PLATELET
Abs Immature Granulocytes: 0.04 10*3/uL (ref 0.00–0.07)
Basophils Absolute: 0 10*3/uL (ref 0.0–0.1)
Basophils Relative: 1 %
Eosinophils Absolute: 0.1 10*3/uL (ref 0.0–0.5)
Eosinophils Relative: 1 %
HCT: 53.3 % — ABNORMAL HIGH (ref 39.0–52.0)
Hemoglobin: 17.2 g/dL — ABNORMAL HIGH (ref 13.0–17.0)
Immature Granulocytes: 1 %
Lymphocytes Relative: 18 %
Lymphs Abs: 1.1 10*3/uL (ref 0.7–4.0)
MCH: 28.9 pg (ref 26.0–34.0)
MCHC: 32.3 g/dL (ref 30.0–36.0)
MCV: 89.6 fL (ref 80.0–100.0)
Monocytes Absolute: 0.6 10*3/uL (ref 0.1–1.0)
Monocytes Relative: 10 %
Neutro Abs: 4.1 10*3/uL (ref 1.7–7.7)
Neutrophils Relative %: 69 %
Platelets: 183 10*3/uL (ref 150–400)
RBC: 5.95 MIL/uL — ABNORMAL HIGH (ref 4.22–5.81)
RDW: 13.2 % (ref 11.5–15.5)
WBC: 5.9 10*3/uL (ref 4.0–10.5)
nRBC: 0 % (ref 0.0–0.2)

## 2023-05-26 LAB — TROPONIN I (HIGH SENSITIVITY): Troponin I (High Sensitivity): 8 ng/L (ref <18)

## 2023-05-26 LAB — RESP PANEL BY RT-PCR (RSV, FLU A&B, COVID)  RVPGX2
Influenza A by PCR: NEGATIVE
Influenza B by PCR: NEGATIVE
Resp Syncytial Virus by PCR: NEGATIVE
SARS Coronavirus 2 by RT PCR: POSITIVE — AB

## 2023-05-26 LAB — BRAIN NATRIURETIC PEPTIDE: B Natriuretic Peptide: 25 pg/mL (ref 0.0–100.0)

## 2023-05-26 MED ORDER — PAXLOVID (300/100) 20 X 150 MG & 10 X 100MG PO TBPK: 3.0000 | ORAL_TABLET | Freq: Two times a day (BID) | ORAL | 0 refills | Status: AC

## 2023-05-26 NOTE — ED Triage Notes (Addendum)
Pt brought in by [facility] from home with c/o cough and not eating since yesterday. Wife has Covid. They reported to EMS that they called his doctor today and they said to bring him to the hospital for a Covid test. VSS for EMS.

## 2023-05-26 NOTE — ED Notes (Signed)
Patient alert and disoriented to time. Patient's family unable to reach. Will notify [facility] to transport patient home.

## 2023-05-26 NOTE — Telephone Encounter (Signed)
  Chief Complaint: COVID exposure Symptoms: extreme fatigue/weakness, congestion, loss of appetite and taste Frequency: couple of days Pertinent Negatives: Patient denies fever Disposition: [x] ED /[] Urgent Care (no appt availability in office) / [] Appointment(In office/virtual)/ []  [facility]/ [] Home Care/ [] Refused Recommended Disposition /[]  Mobile Bus/ []  Follow-up with PCP Additional Notes: patient's wife called with concerns of extreme fatigue/weakness, congestions, loss of appetite and taste. Patient's wife states she has been sick all week and "states I can't do this by myself." Patient's wife is positive for COVID. Wife states that they don't have help during the day and patient is unable to be changed. Wife endorses that they need more help then they currently have. Per protocol based on symptoms, recommendation for the ED given. Wife seems hesitant for the ED but understanding of what her husband needs. Verbalized understanding of plan and all questions answered.  Copied from CRM [id number]. Topic: Clinical - Red Word Triage >> May 26, 2023  9:47 AM Eunice Blase wrote: Red Word that prompted transfer to Nurse Triage: Pt's wife calling on pt's behalf of pt she positive for Covid. Pt is weak, congestion. Pt has had strokes. Reason for Disposition  Patient sounds very sick or weak to the triager  Answer Assessment - Initial Assessment Questions 1. COVID-19 DIAGNOSIS: "How do you know that you have COVID?" (e.g., positive lab test or self-test, diagnosed by doctor or NP/PA, symptoms after exposure).     Symptoms after exposure 2. COVID-19 EXPOSURE: "Was there any known exposure to COVID before the symptoms began?" CDC Definition of close contact: within 6 feet (2 meters) for a total of 15 minutes or more over a 24-hour period.      Lives with wife who tested positive 3. ONSET: "When did the COVID-19 symptoms start?"      Couple of days ago 4. WORST SYMPTOM: "What is your  worst symptom?" (e.g., cough, fever, shortness of breath, muscle aches)     fatigue 5. COUGH: "Do you have a cough?" If Yes, ask: "How bad is the cough?"       Yes 6. FEVER: "Do you have a fever?" If Yes, ask: "What is your temperature, how was it measured, and when did it start?"     No 7. RESPIRATORY STATUS: "Describe your breathing?" (e.g., normal; shortness of breath, wheezing, unable to speak)      normal 8. BETTER-SAME-WORSE: "Are you getting better, staying the same or getting worse compared to yesterday?"  If getting worse, ask, "In what way?"     Gotten worse 9. OTHER SYMPTOMS: "Do you have any other symptoms?"  (e.g., chills, fatigue, headache, loss of smell or taste, muscle pain, sore throat)     Sore throat, lost of taste-not wanting to eat 10. HIGH RISK DISEASE: "Do you have any chronic medical problems?" (e.g., asthma, heart or lung disease, weak immune system, obesity, etc.)       Hx of strokes 11. VACCINE: "Have you had the COVID-19 vaccine?" If Yes, ask: "Which one, how many shots, when did you get it?"       No 13. O2 SATURATION MONITOR:  "Do you use an oxygen saturation monitor (pulse oximeter) at home?" If Yes, ask "What is your reading (oxygen level) today?" "What is your usual oxygen saturation reading?" (e.g., 95%)       No  Protocols used: Coronavirus (COVID-19) Diagnosed or Suspected-A-AH

## 2023-05-26 NOTE — ED Notes (Signed)
Order given to check pulse oximetry while ambulating. Pt is nonambulatory. Verified this with pt's wife. Huston Foley, PA notified.

## 2023-05-26 NOTE — Discharge Instructions (Addendum)
Please begin taking the Paxlovid.  Do not take your atorvastatin while you are on this medication.  Follow-up closely with your primary care doctor on an outpatient basis.  Return to emergency department immediately for any new or worsening symptoms.

## 2023-05-26 NOTE — ED Provider Notes (Signed)
[facility] EMERGENCY DEPARTMENT AT [facility] Provider Note   CSN: [id number] Arrival date & time: 05/26/23  1137     History  Chief Complaint  Patient presents with   Cough    Walter [name] is a 73 y.o. male.  Patient is a 73 year old male who presents to the emergency department secondary to cough, generalized malaise and fatigue and poor appetite since yesterday.  Did call the patient's wife as the patient is unable provide the majority of his own history given a previous CVA.  Wife notes that he did begin feeling poorly last night.  He has had no associated nausea, vomiting or diarrhea.  He has had no complaints of chest pain or shortness of breath.  Patient has had no increased edema.  He has had no fevers at home.  Wife does note that she is currently positive for COVID-19.   Cough      Home Medications Prior to Admission medications   Medication Sig Start Date End Date Taking? Authorizing Provider  acetaminophen (TYLENOL) 325 MG tablet Take 1-2 tablets (325-650 mg total) by mouth every 6 (six) hours as needed for mild pain (pain score 1-3). 09/11/19   Zannie Cove, MD  amLODipine (NORVASC) 5 MG tablet Take 1 tablet (5 mg total) by mouth daily. 11/23/22   Dettinger, Elige Radon, MD  aspirin EC 81 MG tablet Take 81 mg by mouth daily. Swallow whole.    [provider]  atorvastatin (LIPITOR) 40 MG tablet TAKE 1 TABLET EVERY DAY 04/26/23   Dettinger, Elige Radon, MD  clopidogrel (PLAVIX) 75 MG tablet TAKE 1 TABLET EVERY DAY 04/26/23   Dettinger, Elige Radon, MD  Multiple Vitamins-Minerals (ONE-A-DAY 50 PLUS PO) Take by mouth daily.    [provider]  saccharomyces boulardii (FLORASTOR) 250 MG capsule Take 250 mg by mouth 2 (two) times daily.    [provider]  tamsulosin (FLOMAX) 0.4 MG CAPS capsule TAKE 1 CAPSULE (0.4 MG TOTAL) BY MOUTH DAILY AFTER BREAKFAST. 04/26/23   Dettinger, Elige Radon, MD      Allergies    Patient has no known allergies.     Review of Systems   Review of Systems  Respiratory:  Positive for cough.   All other systems reviewed and are negative.   Physical Exam Updated Vital Signs BP (!) 147/77   Pulse 91   Temp 98.3 F (36.8 C) (Oral)   Resp 16   Ht 6\' 1"  (1.854 m)   Wt 84.4 kg   SpO2 95%   BMI 24.55 kg/m  Physical Exam Vitals reviewed.  Constitutional:      Appearance: Normal appearance.  HENT:     Head: Normocephalic and atraumatic.     Nose: Nose normal.     Mouth/Throat:     Mouth: Mucous membranes are moist.  Eyes:     Extraocular Movements: Extraocular movements intact.     Conjunctiva/sclera: Conjunctivae normal.     Pupils: Pupils are equal, round, and reactive to light.  Cardiovascular:     Rate and Rhythm: Normal rate and regular rhythm.     Pulses: Normal pulses.     Heart sounds: Normal heart sounds.  Pulmonary:     Effort: Pulmonary effort is normal. No respiratory distress.     Breath sounds: Normal breath sounds. No stridor. No wheezing, rhonchi or rales.  Abdominal:     General: Abdomen is flat. Bowel sounds are normal. There is no distension.     Palpations:  Abdomen is soft.     Tenderness: There is no abdominal tenderness. There is no guarding.  Musculoskeletal:        General: No swelling or tenderness. Normal range of motion.     Cervical back: Normal range of motion and neck supple.     Right lower leg: No edema.     Left lower leg: No edema.  Skin:    General: Skin is warm and dry.     Findings: No bruising, erythema or rash.  Neurological:     General: No focal deficit present.     Mental Status: He is alert and oriented to person, place, and time. Mental status is at baseline.  Psychiatric:        Mood and Affect: Mood normal.        Behavior: Behavior normal.        Thought Content: Thought content normal.        Judgment: Judgment normal.     ED Results / Procedures / Treatments   Labs (all labs ordered are listed, but only abnormal results are  displayed) Labs Reviewed  RESP PANEL BY RT-PCR (RSV, FLU A&B, COVID)  RVPGX2 - Abnormal; Notable for the following components:      Result Value   SARS Coronavirus 2 by RT PCR POSITIVE (*)    All other components within normal limits  COMPREHENSIVE METABOLIC PANEL - Abnormal; Notable for the following components:   Glucose, Bld 117 (*)    All other components within normal limits  CBC WITH DIFFERENTIAL/PLATELET - Abnormal; Notable for the following components:   RBC 5.95 (*)    Hemoglobin 17.2 (*)    HCT 53.3 (*)    All other components within normal limits  BRAIN NATRIURETIC PEPTIDE  TROPONIN I (HIGH SENSITIVITY)    EKG EKG Interpretation Date/Time:  Friday May 26 2023 13:02:53 EST Ventricular Rate:  87 PR Interval:  184 QRS Duration:  87 QT Interval:  365 QTC Calculation: 440 R Axis:   -79  Text Interpretation: Sinus rhythm Inferior infarct, old Consider anterior infarct rate is slower than prior 11/23 Confirmed by Meridee Score [id number]) on 05/26/2023 1:14:33 PM  Radiology DG Chest Port 1 View Result Date: 05/26/2023 CLINICAL DATA:  Cough. EXAM: PORTABLE CHEST 1 VIEW COMPARISON:  March 02, 2022. FINDINGS: The heart size and mediastinal contours are within normal limits. No acute pulmonary abnormality seen. The visualized skeletal structures are unremarkable. IMPRESSION: No active disease. Electronically Signed   By: Lupita Raider M.D.   On: 05/26/2023 14:00    Procedures Procedures    Medications Ordered in ED Medications - No data to display  ED Course/ Medical Decision Making/ A&P                                 Medical Decision Making This patient presents to the ED for concern of cough, congestion differential diagnosis includes pneumonia, acute viral syndrome, ACS, CHF    Additional history obtained:  Additional history obtained from wife External records from outside source obtained and reviewed including medical records   Lab Tests:  I  Ordered, and personally interpreted labs.  The pertinent results include: Positive for COVID-19, stable kidney function   Imaging Studies ordered:  I ordered imaging studies including chest x-ray I independently visualized and interpreted imaging which showed no acute cardiopulmonary process I agree with the radiologist interpretation   Medicines ordered and prescription  drug management:  I ordered medication including Paxlovid for COVID-19 Reevaluation of the patient after these medicines showed that the patient improved I have reviewed the patients home medicines and have made adjustments as needed   Problem List / ED Course:  Patient is doing well at this time and does remain stable.  Patient has stable vital signs with no indication for sepsis and no associated hypoxia.  Discussed with patient's wife he is positive for COVID-19.  Patient does have stable blood work at this point with no signs of acute kidney injury, changes in liver function, no changes in electrolytes.  Chest x-ray demonstrates no signs of acute consolidation.  Patient is mentally at his baseline at this time.  Did stressed the importance of continued good p.o. intake with the patient and his wife who had voiced understanding.  Discussed the option for Paxlovid with his wife and she is in agreement to this.  She was directed to refrain from giving him his atorvastatin while he is on the Paxlovid.  Strict return precautions were discussed for any new or worsening symptoms as well as need for follow-up with PCP on an outpatient basis.  Wife voiced understand to the plan and had no additional questions.  Patient case was discussed with attending physician.   Social Determinants of Health:  None      Amount and/or Complexity of Data Reviewed Labs: ordered. Radiology: ordered.  Risk Prescription drug management.           Final Clinical Impression(s) / ED Diagnoses Final diagnoses:  None    Rx / DC  Orders ED Discharge Orders     None         Kathlen Mody 05/26/23 1603    Terrilee Files, MD 05/26/23 [phone]

## 2023-05-29 ENCOUNTER — Encounter: Payer: Self-pay | Admitting: Family Medicine

## 2023-05-29 ENCOUNTER — Telehealth (INDEPENDENT_AMBULATORY_CARE_PROVIDER_SITE_OTHER): Payer: Medicare HMO | Admitting: Family Medicine

## 2023-05-29 DIAGNOSIS — Z8673 Personal history of transient ischemic attack (TIA), and cerebral infarction without residual deficits: Secondary | ICD-10-CM | POA: Diagnosis not present

## 2023-05-29 DIAGNOSIS — F01B3 Vascular dementia, moderate, with mood disturbance: Secondary | ICD-10-CM | POA: Diagnosis not present

## 2023-05-29 DIAGNOSIS — U071 COVID-19: Secondary | ICD-10-CM | POA: Diagnosis not present

## 2023-05-29 NOTE — Progress Notes (Signed)
 Virtual Visit via MyChart video note  I connected with [name] on 05/29/23 at 1323 by video and verified that I am speaking with the correct person using two identifiers. Walter [name] is currently located at home and  wife  are currently with her during visit. The provider, Elige Radon Aina Rossbach, MD is located in their office at time of visit.  Call ended at 1342  I discussed the limitations, risks, security and privacy concerns of performing an evaluation and management service by video and the availability of in person appointments. I also discussed with the patient that there may be a patient responsible charge related to this service. The patient expressed understanding and agreed to proceed.   History and Present Illness: Patient was in the ER for COVid. He was given Paxlovid but has not been able to swallow it yet.  He is coughing and coughing up phlegm. Per wife he is breathing well and has not had a fever.  He is keeping hydrated but appetite is down. He is still weak.     1. Moderate vascular dementia with mood disturbance (HCC)   2. History of CVA (cerebrovascular accident)   3. COVID   Patient is mostly wheelchair-bound with dementia that is worsened and history of stroke.  Outpatient Encounter Medications as of 05/29/2023  Medication Sig   acetaminophen (TYLENOL) 325 MG tablet Take 1-2 tablets (325-650 mg total) by mouth every 6 (six) hours as needed for mild pain (pain score 1-3).   amLODipine (NORVASC) 5 MG tablet Take 1 tablet (5 mg total) by mouth daily.   aspirin EC 81 MG tablet Take 81 mg by mouth daily. Swallow whole.   atorvastatin (LIPITOR) 40 MG tablet TAKE 1 TABLET EVERY DAY   clopidogrel (PLAVIX) 75 MG tablet TAKE 1 TABLET EVERY DAY   Multiple Vitamins-Minerals (ONE-A-DAY 50 PLUS PO) Take by mouth daily.   nirmatrelvir/ritonavir (PAXLOVID, 300/100,) 20 x 150 MG & 10 x 100MG  TBPK Take 3 tablets by mouth 2 (two) times daily for 5 days. Patient GFR is >60. Take  nirmatrelvir (150 mg) two tablets twice daily for 5 days and ritonavir (100 mg) one tablet twice daily for 5 days.   saccharomyces boulardii (FLORASTOR) 250 MG capsule Take 250 mg by mouth 2 (two) times daily.   tamsulosin (FLOMAX) 0.4 MG CAPS capsule TAKE 1 CAPSULE (0.4 MG TOTAL) BY MOUTH DAILY AFTER BREAKFAST.   No facility-administered encounter medications on file as of 05/29/2023.    Review of Systems  Constitutional:  Negative for chills and fever.  Eyes:  Negative for visual disturbance.  Respiratory:  Negative for shortness of breath and wheezing.   Cardiovascular:  Negative for chest pain and leg swelling.  Musculoskeletal:  Negative for back pain and gait problem.  Skin:  Negative for rash.  Neurological:  Negative for dizziness, weakness and light-headedness.  All other systems reviewed and are negative.   Observations/Objective: Patient sounds comfort and in no acute distress  Assessment and Plan: Problem List Items Addressed This Visit       Nervous and Auditory   Vascular dementia [facility]) - Primary   Relevant Orders   Ambulatory referral to Home Health     Other   History of CVA (cerebrovascular accident)   Relevant Orders   Ambulatory referral to Home Health   Other Visit Diagnoses       COVID           Seems like he is doing better from  COVID, she will try and cross the medicine and get it to them but his breathing seems to be doing well.  Placed referral for home health aide to see if they can help her at home.  If not she is probably discussing some type of placement. Follow up plan: Return if symptoms worsen or fail to improve.     I discussed the assessment and treatment plan with the patient. The patient was provided an opportunity to ask questions and all were answered. The patient agreed with the plan and demonstrated an understanding of the instructions.   The patient was advised to call back or seek an in-person evaluation if the symptoms  worsen or if the condition fails to improve as anticipated.  The above assessment and management plan was discussed with the patient. The patient verbalized understanding of and has agreed to the management plan. Patient is aware to call the clinic if symptoms persist or worsen. Patient is aware when to return to the clinic for a follow-up visit. Patient educated on when it is appropriate to go to the emergency department.    I provided 9 minutes of non-face-to-face time during this encounter.    Nils Pyle, MD

## 2023-06-05 ENCOUNTER — Telehealth: Payer: Self-pay | Admitting: Family Medicine

## 2023-06-05 DIAGNOSIS — F05 Delirium due to known physiological condition: Secondary | ICD-10-CM | POA: Diagnosis not present

## 2023-06-05 DIAGNOSIS — I69328 Other speech and language deficits following cerebral infarction: Secondary | ICD-10-CM | POA: Diagnosis not present

## 2023-06-05 DIAGNOSIS — F01B3 Vascular dementia, moderate, with mood disturbance: Secondary | ICD-10-CM | POA: Diagnosis not present

## 2023-06-05 DIAGNOSIS — Z7902 Long term (current) use of antithrombotics/antiplatelets: Secondary | ICD-10-CM | POA: Diagnosis not present

## 2023-06-05 DIAGNOSIS — Z9181 History of falling: Secondary | ICD-10-CM | POA: Diagnosis not present

## 2023-06-05 DIAGNOSIS — Z7982 Long term (current) use of aspirin: Secondary | ICD-10-CM | POA: Diagnosis not present

## 2023-06-05 DIAGNOSIS — Z8616 Personal history of COVID-19: Secondary | ICD-10-CM | POA: Diagnosis not present

## 2023-06-05 DIAGNOSIS — F01B18 Vascular dementia, moderate, with other behavioral disturbance: Secondary | ICD-10-CM | POA: Diagnosis not present

## 2023-06-05 NOTE — Telephone Encounter (Signed)
 Yes, go ahead and give verbal for community resources and social work to help out in the home and for skilled nursing

## 2023-06-05 NOTE — Telephone Encounter (Signed)
 Left message making Archie Patten with [facility] aware or Dr. Darrol Poke recommendations.

## 2023-06-05 NOTE — Telephone Encounter (Signed)
 Copied from CRM [id number]. Topic: Clinical - Home Health Verbal Orders >> Jun 05, 2023 12:15 PM Carlatta H wrote: Caller/Agency: [facility]  Callback Number: [phone] Service Requested: Skilled Nursing Frequency: 1x a week for 2 weeks// Any new concerns about the patient? Yes Client refused assistance with bathing//Social work to access for community resources

## 2023-06-12 ENCOUNTER — Ambulatory Visit (INDEPENDENT_AMBULATORY_CARE_PROVIDER_SITE_OTHER)

## 2023-06-12 DIAGNOSIS — F05 Delirium due to known physiological condition: Secondary | ICD-10-CM

## 2023-06-12 DIAGNOSIS — F01B18 Vascular dementia, moderate, with other behavioral disturbance: Secondary | ICD-10-CM

## 2023-06-12 DIAGNOSIS — I69328 Other speech and language deficits following cerebral infarction: Secondary | ICD-10-CM | POA: Diagnosis not present

## 2023-06-12 DIAGNOSIS — F01B3 Vascular dementia, moderate, with mood disturbance: Secondary | ICD-10-CM | POA: Diagnosis not present

## 2023-06-12 DIAGNOSIS — Z9181 History of falling: Secondary | ICD-10-CM | POA: Diagnosis not present

## 2023-06-12 DIAGNOSIS — Z8616 Personal history of COVID-19: Secondary | ICD-10-CM | POA: Diagnosis not present

## 2023-06-12 DIAGNOSIS — Z7902 Long term (current) use of antithrombotics/antiplatelets: Secondary | ICD-10-CM | POA: Diagnosis not present

## 2023-06-12 DIAGNOSIS — Z7982 Long term (current) use of aspirin: Secondary | ICD-10-CM | POA: Diagnosis not present

## 2023-06-14 DIAGNOSIS — Z7982 Long term (current) use of aspirin: Secondary | ICD-10-CM | POA: Diagnosis not present

## 2023-06-14 DIAGNOSIS — Z9181 History of falling: Secondary | ICD-10-CM | POA: Diagnosis not present

## 2023-06-14 DIAGNOSIS — F01B3 Vascular dementia, moderate, with mood disturbance: Secondary | ICD-10-CM | POA: Diagnosis not present

## 2023-06-14 DIAGNOSIS — F01B18 Vascular dementia, moderate, with other behavioral disturbance: Secondary | ICD-10-CM | POA: Diagnosis not present

## 2023-06-14 DIAGNOSIS — I69328 Other speech and language deficits following cerebral infarction: Secondary | ICD-10-CM | POA: Diagnosis not present

## 2023-06-14 DIAGNOSIS — F05 Delirium due to known physiological condition: Secondary | ICD-10-CM | POA: Diagnosis not present

## 2023-06-14 DIAGNOSIS — Z8616 Personal history of COVID-19: Secondary | ICD-10-CM | POA: Diagnosis not present

## 2023-06-14 DIAGNOSIS — Z7902 Long term (current) use of antithrombotics/antiplatelets: Secondary | ICD-10-CM | POA: Diagnosis not present

## 2023-06-21 DIAGNOSIS — F05 Delirium due to known physiological condition: Secondary | ICD-10-CM | POA: Diagnosis not present

## 2023-06-21 DIAGNOSIS — Z7902 Long term (current) use of antithrombotics/antiplatelets: Secondary | ICD-10-CM | POA: Diagnosis not present

## 2023-06-21 DIAGNOSIS — Z9181 History of falling: Secondary | ICD-10-CM | POA: Diagnosis not present

## 2023-06-21 DIAGNOSIS — I69328 Other speech and language deficits following cerebral infarction: Secondary | ICD-10-CM | POA: Diagnosis not present

## 2023-06-21 DIAGNOSIS — Z7982 Long term (current) use of aspirin: Secondary | ICD-10-CM | POA: Diagnosis not present

## 2023-06-21 DIAGNOSIS — Z8616 Personal history of COVID-19: Secondary | ICD-10-CM | POA: Diagnosis not present

## 2023-06-21 DIAGNOSIS — F01B18 Vascular dementia, moderate, with other behavioral disturbance: Secondary | ICD-10-CM | POA: Diagnosis not present

## 2023-06-21 DIAGNOSIS — F01B3 Vascular dementia, moderate, with mood disturbance: Secondary | ICD-10-CM | POA: Diagnosis not present

## 2023-11-01 ENCOUNTER — Encounter (HOSPITAL_COMMUNITY): Payer: Self-pay

## 2023-11-01 ENCOUNTER — Emergency Department (HOSPITAL_COMMUNITY)

## 2023-11-01 ENCOUNTER — Emergency Department (HOSPITAL_COMMUNITY)
Admission: EM | Admit: 2023-11-01 | Discharge: 2023-11-01 | Disposition: A | Discharge status: Home / Self Care | Attending: Emergency Medicine | Admitting: Emergency Medicine

## 2023-11-01 ENCOUNTER — Telehealth: Payer: Self-pay | Admitting: Family Medicine

## 2023-11-01 ENCOUNTER — Other Ambulatory Visit: Payer: Self-pay

## 2023-11-01 DIAGNOSIS — Z8673 Personal history of transient ischemic attack (TIA), and cerebral infarction without residual deficits: Secondary | ICD-10-CM | POA: Insufficient documentation

## 2023-11-01 DIAGNOSIS — Z7982 Long term (current) use of aspirin: Secondary | ICD-10-CM | POA: Diagnosis not present

## 2023-11-01 DIAGNOSIS — W19XXXA Unspecified fall, initial encounter: Secondary | ICD-10-CM

## 2023-11-01 DIAGNOSIS — M47816 Spondylosis without myelopathy or radiculopathy, lumbar region: Secondary | ICD-10-CM | POA: Diagnosis not present

## 2023-11-01 DIAGNOSIS — M4802 Spinal stenosis, cervical region: Secondary | ICD-10-CM | POA: Diagnosis not present

## 2023-11-01 DIAGNOSIS — R079 Chest pain, unspecified: Secondary | ICD-10-CM | POA: Diagnosis not present

## 2023-11-01 DIAGNOSIS — M47812 Spondylosis without myelopathy or radiculopathy, cervical region: Secondary | ICD-10-CM | POA: Diagnosis not present

## 2023-11-01 DIAGNOSIS — R109 Unspecified abdominal pain: Secondary | ICD-10-CM | POA: Insufficient documentation

## 2023-11-01 DIAGNOSIS — G459 Transient cerebral ischemic attack, unspecified: Secondary | ICD-10-CM | POA: Diagnosis not present

## 2023-11-01 DIAGNOSIS — R0789 Other chest pain: Secondary | ICD-10-CM | POA: Insufficient documentation

## 2023-11-01 DIAGNOSIS — I6523 Occlusion and stenosis of bilateral carotid arteries: Secondary | ICD-10-CM | POA: Diagnosis not present

## 2023-11-01 DIAGNOSIS — J439 Emphysema, unspecified: Secondary | ICD-10-CM | POA: Diagnosis not present

## 2023-11-01 DIAGNOSIS — R270 Ataxia, unspecified: Secondary | ICD-10-CM | POA: Diagnosis not present

## 2023-11-01 DIAGNOSIS — S0990XA Unspecified injury of head, initial encounter: Secondary | ICD-10-CM | POA: Diagnosis not present

## 2023-11-01 DIAGNOSIS — I6789 Other cerebrovascular disease: Secondary | ICD-10-CM | POA: Diagnosis not present

## 2023-11-01 DIAGNOSIS — S20212A Contusion of left front wall of thorax, initial encounter: Secondary | ICD-10-CM

## 2023-11-01 DIAGNOSIS — M48061 Spinal stenosis, lumbar region without neurogenic claudication: Secondary | ICD-10-CM | POA: Diagnosis not present

## 2023-11-01 DIAGNOSIS — W139XXA Fall from, out of or through building, not otherwise specified, initial encounter: Secondary | ICD-10-CM | POA: Diagnosis not present

## 2023-11-01 DIAGNOSIS — S3992XA Unspecified injury of lower back, initial encounter: Secondary | ICD-10-CM | POA: Diagnosis not present

## 2023-11-01 DIAGNOSIS — M47817 Spondylosis without myelopathy or radiculopathy, lumbosacral region: Secondary | ICD-10-CM | POA: Diagnosis not present

## 2023-11-01 LAB — COMPREHENSIVE METABOLIC PANEL WITH GFR
ALT: 12 U/L (ref 0–44)
AST: 13 U/L — ABNORMAL LOW (ref 15–41)
Albumin: 3.6 g/dL (ref 3.5–5.0)
Alkaline Phosphatase: 75 U/L (ref 38–126)
Anion gap: 10 (ref 5–15)
BUN: 12 mg/dL (ref 8–23)
CO2: 23 mmol/L (ref 22–32)
Calcium: 8.8 mg/dL — ABNORMAL LOW (ref 8.9–10.3)
Chloride: 104 mmol/L (ref 98–111)
Creatinine, Ser: 1.1 mg/dL (ref 0.61–1.24)
GFR, Estimated: >60 mL/min (ref >60)
Glucose, Bld: 113 mg/dL — ABNORMAL HIGH (ref 70–99)
Potassium: 3.5 mmol/L (ref 3.5–5.1)
Sodium: 137 mmol/L (ref 135–145)
Total Bilirubin: 0.9 mg/dL (ref 0.0–1.2)
Total Protein: 7.5 g/dL (ref 6.5–8.1)

## 2023-11-01 LAB — CBC WITH DIFFERENTIAL/PLATELET
Abs Immature Granulocytes: 0.06 K/uL (ref 0.00–0.07)
Basophils Absolute: 0.1 K/uL (ref 0.0–0.1)
Basophils Relative: 1 %
Eosinophils Absolute: 0.1 K/uL (ref 0.0–0.5)
Eosinophils Relative: 1 %
HCT: 50.9 % (ref 39.0–52.0)
Hemoglobin: 16.7 g/dL (ref 13.0–17.0)
Immature Granulocytes: 1 %
Lymphocytes Relative: 18 %
Lymphs Abs: 1.4 K/uL (ref 0.7–4.0)
MCH: 29.4 pg (ref 26.0–34.0)
MCHC: 32.8 g/dL (ref 30.0–36.0)
MCV: 89.6 fL (ref 80.0–100.0)
Monocytes Absolute: 0.7 K/uL (ref 0.1–1.0)
Monocytes Relative: 9 %
Neutro Abs: 5.4 K/uL (ref 1.7–7.7)
Neutrophils Relative %: 70 %
Platelets: 227 K/uL (ref 150–400)
RBC: 5.68 MIL/uL (ref 4.22–5.81)
RDW: 13.7 % (ref 11.5–15.5)
WBC: 7.8 K/uL (ref 4.0–10.5)
nRBC: 0 % (ref 0.0–0.2)

## 2023-11-01 LAB — URINALYSIS, ROUTINE W REFLEX MICROSCOPIC
Bacteria, UA: NONE SEEN
Bilirubin Urine: NEGATIVE
Glucose, UA: NEGATIVE mg/dL
Hgb urine dipstick: NEGATIVE
Ketones, ur: NEGATIVE mg/dL
Leukocytes,Ua: NEGATIVE
Nitrite: NEGATIVE
Protein, ur: 30 mg/dL — AB
Specific Gravity, Urine: 1.041 — ABNORMAL HIGH (ref 1.005–1.030)
pH: 5 (ref 5.0–8.0)

## 2023-11-01 LAB — TROPONIN I (HIGH SENSITIVITY): Troponin I (High Sensitivity): 6 ng/L (ref <18)

## 2023-11-01 LAB — LIPASE, BLOOD: Lipase: 30 U/L (ref 11–51)

## 2023-11-01 MED ORDER — ONDANSETRON HCL 4 MG/2ML IJ SOLN
4.0000 mg | Freq: Once | INTRAMUSCULAR | Status: AC
  Administered 2023-11-01: 4 mg via INTRAVENOUS
  Filled 2023-11-01: qty 2

## 2023-11-01 MED ORDER — MORPHINE SULFATE (PF) 4 MG/ML IV SOLN
2.0000 mg | Freq: Once | INTRAVENOUS | Status: AC
  Administered 2023-11-01: 2 mg via INTRAVENOUS
  Filled 2023-11-01: qty 1

## 2023-11-01 MED ORDER — IOHEXOL 350 MG/ML SOLN
75.0000 mL | Freq: Once | INTRAVENOUS | Status: AC | PRN
  Administered 2023-11-01: 75 mL via INTRAVENOUS

## 2023-11-01 NOTE — Telephone Encounter (Signed)
 Patients wife states she will call once he comes out of the hospital. She will call to make virtual visit with Dettinger for the [facility]

## 2023-11-01 NOTE — ED Triage Notes (Signed)
 Pt c/o of fall; wife states pt fell off of back porch Monday, no known LOC; c/o lft flank pain, pain across the mid chest. hx of TIAs, A&Ox2; denies N/V/D, headache. Last BM unknown; pt's wife states she has been giving him OTC pain relievers.

## 2023-11-01 NOTE — Discharge Instructions (Signed)
 Please follow-up closely with your primary care doctor on an outpatient basis.  Return to emergency department immediately for any new or worsening symptoms.  You may continue to take Tylenol  as needed for your pain.

## 2023-11-01 NOTE — Telephone Encounter (Signed)
 Please advise/confirm next steps so we can call and let patients spouse know.   Copied from CRM [id number]. Topic: General - Other >> Nov 01, 2023 10:19 AM Zebedee SAUNDERS wrote: Reason for CRM: Pt's wife Nodine,[place] ph: [phone] has made decision to place pt in nursing home. She would like Dr. Maryanne to proceed with paperwork. She would like a call back at [phone].

## 2023-11-01 NOTE — ED Provider Notes (Signed)
 [place] EMERGENCY DEPARTMENT AT [facility] Provider Note   CSN: [id number] Arrival date & time: 11/01/23  9048     Patient presents with: Walter [name] is a 73 y.o. male.   Patient is a 73 year old male who presents to the emergency department with his family secondary to pain to the left side of the chest and flank.  Family notes that the patient fell off of a porch approximately 2 days ago when he slipped on 2 stairs.  Patient does have a history of TIAs and has been mentating at his baseline.  He currently admits to pain to the left side and flank.  He notes he did not strike his head during the fall.  He denies any neck pain or back pain.  He denies any long bone or joint pain.  There is been no associated nausea, vomiting.  There was no associated syncope.   Fall Associated symptoms include chest pain.       Prior to Admission medications   Medication Sig Start Date End Date Taking? Authorizing Provider  acetaminophen  (TYLENOL ) 325 MG tablet Take 1-2 tablets (325-650 mg total) by mouth every 6 (six) hours as needed for mild pain (pain score 1-3). 09/11/19   Fairy Frames, MD  amLODipine  (NORVASC ) 5 MG tablet Take 1 tablet (5 mg total) by mouth daily. 11/23/22   Dettinger, Fonda LABOR, MD  aspirin  EC 81 MG tablet Take 81 mg by mouth daily. Swallow whole.    [provider]  atorvastatin  (LIPITOR) 40 MG tablet TAKE 1 TABLET EVERY DAY 04/26/23   Dettinger, Fonda LABOR, MD  clopidogrel  (PLAVIX ) 75 MG tablet TAKE 1 TABLET EVERY DAY 04/26/23   Dettinger, Fonda LABOR, MD  Multiple Vitamins-Minerals (ONE-A-DAY 50 PLUS PO) Take by mouth daily.    [provider]  saccharomyces boulardii (FLORASTOR) 250 MG capsule Take 250 mg by mouth 2 (two) times daily.    [provider]  tamsulosin  (FLOMAX ) 0.4 MG CAPS capsule TAKE 1 CAPSULE (0.4 MG TOTAL) BY MOUTH DAILY AFTER BREAKFAST. 04/26/23   Dettinger, Fonda LABOR, MD    Allergies: Patient has no known  allergies.    Review of Systems  Cardiovascular:  Positive for chest pain.  Genitourinary:  Positive for flank pain.  All other systems reviewed and are negative.   Updated Vital Signs BP (!) 169/109 (BP Location: Left Arm)   Pulse 84   Temp 97.8 F (36.6 C) (Oral)   Resp 13   Ht 6' 1 (1.854 m)   Wt 84.4 kg   SpO2 95%   BMI 24.55 kg/m   Physical Exam Vitals and nursing note reviewed.  Constitutional:      General: He is not in acute distress.    Appearance: Normal appearance. He is not ill-appearing.  HENT:     Head: Normocephalic and atraumatic.     Nose: Nose normal.     Mouth/Throat:     Mouth: Mucous membranes are moist.  Eyes:     Extraocular Movements: Extraocular movements intact.     Conjunctiva/sclera: Conjunctivae normal.     Pupils: Pupils are equal, round, and reactive to light.  Cardiovascular:     Rate and Rhythm: Normal rate and regular rhythm.     Pulses: Normal pulses.     Heart sounds: Normal heart sounds. No murmur heard.    No gallop.  Pulmonary:     Effort: Pulmonary effort is normal. No respiratory distress.  Breath sounds: Normal breath sounds. No stridor. No wheezing, rhonchi or rales.     Comments: Tender to palpation over the left lateral aspect of the chest Abdominal:     General: Abdomen is flat. Bowel sounds are normal. There is no distension.     Palpations: Abdomen is soft.     Tenderness: There is no guarding.     Comments: Tender to palpation over left flank, no bruising  Musculoskeletal:        General: No swelling, tenderness, deformity or signs of injury. Normal range of motion.     Cervical back: Normal range of motion and neck supple. No rigidity or tenderness.     Comments: Nontender palpation over thoracic and lumbar spine, no step-off or deformity, pelvis stable to AP and lateral compression, nontender palpation over bilateral upper and lower extremities throughout, full range of motion noted throughout, no obvious  deformity or bruising, no skin breakdown or ulceration, no lacerations or abrasions  Skin:    General: Skin is warm and dry.     Findings: No bruising, erythema or rash.  Neurological:     General: No focal deficit present.     Mental Status: He is alert. Mental status is at baseline.     Cranial Nerves: No cranial nerve deficit.     Sensory: No sensory deficit.     Motor: No weakness.     Coordination: Coordination normal.  Psychiatric:        Mood and Affect: Mood normal.        Behavior: Behavior normal.        Thought Content: Thought content normal.        Judgment: Judgment normal.     (all labs ordered are listed, but only abnormal results are displayed) Labs Reviewed  COMPREHENSIVE METABOLIC PANEL WITH GFR  CBC WITH DIFFERENTIAL/PLATELET  LIPASE, BLOOD  URINALYSIS, ROUTINE W REFLEX MICROSCOPIC    EKG: EKG Interpretation Date/Time:  Wednesday November 01 2023 10:06:46 EDT Ventricular Rate:  97 PR Interval:  195 QRS Duration:  92 QT Interval:  339 QTC Calculation: 431 R Axis:   -80  Text Interpretation: Sinus rhythm Anterior injury pattern Baseline wander Abnormal ECG Confirmed by Garrick Charleston [phone]) on 11/01/2023 10:14:01 AM  Radiology: No results found.   Procedures   Medications Ordered in the ED  morphine  (PF) 4 MG/ML injection 2 mg (has no administration in time range)  ondansetron  (ZOFRAN ) injection 4 mg (has no administration in time range)                                    Medical Decision Making Amount and/or Complexity of Data Reviewed Labs: ordered. Radiology: ordered.  Risk Prescription drug management.   This patient presents to the ED for concern of fall, left-sided chest pain and flank pain differential diagnosis includes rib fracture, intra-abdominal hemorrhage, contusion, sprain, strain, long bone or joint fracture, intracranial hemorrhage, vertebral fracture    Additional history obtained:  Additional history obtained from  family External records from outside source obtained and reviewed including medical records   Lab Tests:  I Ordered, and personally interpreted labs.  The pertinent results include: No leukocytosis, no anemia, normal kidney function liver function, normal electrolytes, negative troponin, unremarkable urinalysis   Imaging Studies ordered:  I ordered imaging studies including CT scan head, CT cervical spine, CT chest abdomen pelvis, MRI lumbar spine I independently visualized and  interpreted imaging which showed no acute intracranial hemorrhage, no cervical spine fracture, no acute traumatic process within the chest abdomen and pelvis, chronic L2 compression fracture I agree with the radiologist interpretation   Medicines ordered and prescription drug management:  I ordered medication including morphine , Zofran  for acute traumatic pain Reevaluation of the patient after these medicines showed that the patient improved I have reviewed the patients home medicines and have made adjustments as needed   Problem List / ED Course:  Patient is doing well at this time and is stable for discharge home.  Vital signs have remained stable.  Discussed with patient and family that all imaging has been unremarkable for any signs of acute traumatic process.  He was nontender palpation of her bilateral upper and lower extremities.  MRI did demonstrate a chronic L2 compression fracture and he was nontender to palpation directly over his thoracic or lumbar spine.  Suspect rib contusion at this point.  Blood work is otherwise been unremarkable.  He has no indication for underlying infectious source.  He is mentating at his baseline per the family at this time.  I do not suspect that admission is warranted.  The need for continued symptomatic treatment with Tylenol  at home was discussed.  Strict turn precautions were provided for any new or worsening symptoms.  Discussed need for close follow-up with PCP on  outpatient basis.  Family voiced understanding to the plan and had no additional questions.   Social Determinants of Health:  None        Final diagnoses:  None    ED Discharge Orders     None          Daralene Lonni JONETTA DEVONNA 11/01/23 1357    Garrick Charleston, MD 11/02/23 1125

## 2023-11-08 ENCOUNTER — Telehealth

## 2023-11-17 ENCOUNTER — Encounter: Payer: Self-pay | Admitting: Family Medicine

## 2023-11-17 ENCOUNTER — Telehealth (INDEPENDENT_AMBULATORY_CARE_PROVIDER_SITE_OTHER): Admitting: Family Medicine

## 2023-11-17 DIAGNOSIS — I10 Essential (primary) hypertension: Secondary | ICD-10-CM

## 2023-11-17 DIAGNOSIS — Z8673 Personal history of transient ischemic attack (TIA), and cerebral infarction without residual deficits: Secondary | ICD-10-CM | POA: Diagnosis not present

## 2023-11-17 DIAGNOSIS — F01B3 Vascular dementia, moderate, with mood disturbance: Secondary | ICD-10-CM

## 2023-11-17 DIAGNOSIS — R482 Apraxia: Secondary | ICD-10-CM | POA: Diagnosis not present

## 2023-11-17 NOTE — Progress Notes (Signed)
 Virtual Visit via MyChart video note  I connected with [name] on 11/17/23 at 1512 by video and verified that I am speaking with the correct person using two identifiers. Walter [name] is currently located at home and patient and wife and granddaughter are currently with her during visit. The provider, Fonda LABOR Tiani Stanbery, MD is located in their office at time of visit.  Call ended at 1525  I discussed the limitations, risks, security and privacy concerns of performing an evaluation and management service by video and the availability of in person appointments. I also discussed with the patient that there may be a patient responsible charge related to this service. The patient expressed understanding and agreed to proceed.   History and Present Illness: Discussed the use of AI scribe software for clinical note transcription with the patient, who gave verbal consent to proceed.  History of Present Illness   Walter [name] is a 72 year old male with vascular dementia who presents for discussion of FL2 placement form.  He is seeking assistance with the FL2 placement form for long-term care. The caregiver is coordinating with [name] to secure a room with a window view, as he enjoys looking outside and was dissatisfied with a previous room without a window.  He has a history of strokes, which have significantly impacted his mobility and speech. He is primarily bedbound but can ambulate with a walker, requiring considerable time and assistance due to very small steps and the risk of falls. The current home environment is not conducive to his mobility needs due to structural challenges such as step downs.        Outpatient Encounter Medications as of 11/17/2023  Medication Sig   acetaminophen  (TYLENOL ) 325 MG tablet Take 1-2 tablets (325-650 mg total) by mouth every 6 (six) hours as needed for mild pain (pain score 1-3).   amLODipine  (NORVASC ) 5 MG tablet Take 1 tablet (5 mg total) by mouth  daily.   atorvastatin  (LIPITOR) 40 MG tablet TAKE 1 TABLET EVERY DAY   clopidogrel  (PLAVIX ) 75 MG tablet TAKE 1 TABLET EVERY DAY   tamsulosin  (FLOMAX ) 0.4 MG CAPS capsule TAKE 1 CAPSULE (0.4 MG TOTAL) BY MOUTH DAILY AFTER BREAKFAST.   No facility-administered encounter medications on file as of 11/17/2023.    Review of Systems  Constitutional:  Negative for chills and fever.  Eyes:  Negative for visual disturbance.  Respiratory:  Negative for shortness of breath and wheezing.   Cardiovascular:  Negative for chest pain and leg swelling.  Skin:  Negative for rash.  Neurological:  Negative for dizziness and light-headedness.  Psychiatric/Behavioral:  Positive for confusion.   All other systems reviewed and are negative.   Observations/Objective: Patient sounds comfortable and in no acute distress  Assessment and Plan: Problem List Items Addressed This Visit       Cardiovascular and Mediastinum   HTN (hypertension)     Nervous and Auditory   Vascular dementia (HCC)     Other   Speech apraxia   History of CVA (cerebrovascular accident) - Primary      Vascular dementia due to strokes Chronic condition with cognitive decline and speech difficulties. Primarily bedbound due to dementia and stroke history. - Discussed FL2 placement form for long-term care. - Considered facility with window view to improve quality of life.  Bedbound status with limited mobility Limited mobility with fall risk. Home environment not safe for ambulation. - Supervise ambulation to prevent falls. - Consider alternative  living arrangements due to home environment challenges.      [name] and is trying to get a room and wants to stay long term.   Can get up some with walker but is a fall risk.  He talks some but is very hard to understand and  Urine incontinence Bowel movements mostly under control.  Follow up plan: Return if symptoms worsen or fail to improve.     I discussed the  assessment and treatment plan with the patient. The patient was provided an opportunity to ask questions and all were answered. The patient agreed with the plan and demonstrated an understanding of the instructions.   The patient was advised to call back or seek an in-person evaluation if the symptoms worsen or if the condition fails to improve as anticipated.  The above assessment and management plan was discussed with the patient. The patient verbalized understanding of and has agreed to the management plan. Patient is aware to call the clinic if symptoms persist or worsen. Patient is aware when to return to the clinic for a follow-up visit. Patient educated on when it is appropriate to go to the emergency department.    I provided 13 minutes of non-face-to-face time during this encounter.    Fonda DELENA Levins, MD

## 2023-11-28 ENCOUNTER — Telehealth: Payer: Self-pay | Admitting: Family Medicine

## 2023-11-28 NOTE — Telephone Encounter (Signed)
 Copied from CRM [id number]. Topic: General - Other >> Nov 28, 2023 11:15 AM DeAngela L wrote: Reason for CRM: [place] patient wife calling to ask if the office has completed the [facility] form  The 1st form was incorrect and the 2nd form was given to possibly Donny in office  She has someone to help her get things going quickly and has questions about the form   Rock wife [phone] (H) has 1pm dentist appt that could last 2 hours please call before or after the dentist appt

## 2023-12-15 DIAGNOSIS — R296 Repeated falls: Secondary | ICD-10-CM | POA: Diagnosis not present

## 2023-12-15 DIAGNOSIS — R269 Unspecified abnormalities of gait and mobility: Secondary | ICD-10-CM | POA: Diagnosis not present

## 2023-12-15 DIAGNOSIS — I69328 Other speech and language deficits following cerebral infarction: Secondary | ICD-10-CM | POA: Diagnosis not present

## 2023-12-15 DIAGNOSIS — R2689 Other abnormalities of gait and mobility: Secondary | ICD-10-CM | POA: Diagnosis not present

## 2023-12-15 DIAGNOSIS — R482 Apraxia: Secondary | ICD-10-CM | POA: Diagnosis not present

## 2023-12-15 DIAGNOSIS — R1312 Dysphagia, oropharyngeal phase: Secondary | ICD-10-CM | POA: Diagnosis not present

## 2023-12-15 DIAGNOSIS — M6281 Muscle weakness (generalized): Secondary | ICD-10-CM | POA: Diagnosis not present

## 2023-12-15 DIAGNOSIS — F01B3 Vascular dementia, moderate, with mood disturbance: Secondary | ICD-10-CM | POA: Diagnosis not present

## 2023-12-18 DIAGNOSIS — J449 Chronic obstructive pulmonary disease, unspecified: Secondary | ICD-10-CM | POA: Diagnosis not present

## 2023-12-18 DIAGNOSIS — E559 Vitamin D deficiency, unspecified: Secondary | ICD-10-CM | POA: Diagnosis not present

## 2023-12-18 DIAGNOSIS — Z1321 Encounter for screening for nutritional disorder: Secondary | ICD-10-CM | POA: Diagnosis not present

## 2023-12-18 DIAGNOSIS — M6281 Muscle weakness (generalized): Secondary | ICD-10-CM | POA: Diagnosis not present

## 2023-12-18 DIAGNOSIS — F01B3 Vascular dementia, moderate, with mood disturbance: Secondary | ICD-10-CM | POA: Diagnosis not present

## 2023-12-18 DIAGNOSIS — R2689 Other abnormalities of gait and mobility: Secondary | ICD-10-CM | POA: Diagnosis not present

## 2023-12-18 DIAGNOSIS — R482 Apraxia: Secondary | ICD-10-CM | POA: Diagnosis not present

## 2023-12-18 DIAGNOSIS — R269 Unspecified abnormalities of gait and mobility: Secondary | ICD-10-CM | POA: Diagnosis not present

## 2023-12-18 DIAGNOSIS — I10 Essential (primary) hypertension: Secondary | ICD-10-CM | POA: Diagnosis not present

## 2023-12-18 DIAGNOSIS — Z7189 Other specified counseling: Secondary | ICD-10-CM | POA: Diagnosis not present

## 2023-12-18 DIAGNOSIS — Z131 Encounter for screening for diabetes mellitus: Secondary | ICD-10-CM | POA: Diagnosis not present

## 2023-12-18 DIAGNOSIS — R296 Repeated falls: Secondary | ICD-10-CM | POA: Diagnosis not present

## 2023-12-18 DIAGNOSIS — I69328 Other speech and language deficits following cerebral infarction: Secondary | ICD-10-CM | POA: Diagnosis not present

## 2023-12-18 DIAGNOSIS — R1312 Dysphagia, oropharyngeal phase: Secondary | ICD-10-CM | POA: Diagnosis not present

## 2023-12-18 DIAGNOSIS — F419 Anxiety disorder, unspecified: Secondary | ICD-10-CM | POA: Diagnosis not present

## 2023-12-18 DIAGNOSIS — Z79899 Other long term (current) drug therapy: Secondary | ICD-10-CM | POA: Diagnosis not present

## 2023-12-18 DIAGNOSIS — F32A Depression, unspecified: Secondary | ICD-10-CM | POA: Diagnosis not present

## 2023-12-19 DIAGNOSIS — R482 Apraxia: Secondary | ICD-10-CM | POA: Diagnosis not present

## 2023-12-19 DIAGNOSIS — M6281 Muscle weakness (generalized): Secondary | ICD-10-CM | POA: Diagnosis not present

## 2023-12-19 DIAGNOSIS — F01B3 Vascular dementia, moderate, with mood disturbance: Secondary | ICD-10-CM | POA: Diagnosis not present

## 2023-12-19 DIAGNOSIS — R269 Unspecified abnormalities of gait and mobility: Secondary | ICD-10-CM | POA: Diagnosis not present

## 2023-12-19 DIAGNOSIS — Z8673 Personal history of transient ischemic attack (TIA), and cerebral infarction without residual deficits: Secondary | ICD-10-CM | POA: Diagnosis not present

## 2023-12-19 DIAGNOSIS — I69328 Other speech and language deficits following cerebral infarction: Secondary | ICD-10-CM | POA: Diagnosis not present

## 2023-12-19 DIAGNOSIS — Z79899 Other long term (current) drug therapy: Secondary | ICD-10-CM | POA: Diagnosis not present

## 2023-12-19 DIAGNOSIS — I10 Essential (primary) hypertension: Secondary | ICD-10-CM | POA: Diagnosis not present

## 2023-12-19 DIAGNOSIS — R1312 Dysphagia, oropharyngeal phase: Secondary | ICD-10-CM | POA: Diagnosis not present

## 2023-12-19 DIAGNOSIS — R2689 Other abnormalities of gait and mobility: Secondary | ICD-10-CM | POA: Diagnosis not present

## 2023-12-19 DIAGNOSIS — J449 Chronic obstructive pulmonary disease, unspecified: Secondary | ICD-10-CM | POA: Diagnosis not present

## 2023-12-19 DIAGNOSIS — R296 Repeated falls: Secondary | ICD-10-CM | POA: Diagnosis not present

## 2023-12-19 DIAGNOSIS — R52 Pain, unspecified: Secondary | ICD-10-CM | POA: Diagnosis not present

## 2023-12-19 DIAGNOSIS — I2510 Atherosclerotic heart disease of native coronary artery without angina pectoris: Secondary | ICD-10-CM | POA: Diagnosis not present

## 2023-12-20 DIAGNOSIS — E785 Hyperlipidemia, unspecified: Secondary | ICD-10-CM | POA: Diagnosis not present

## 2023-12-20 DIAGNOSIS — J449 Chronic obstructive pulmonary disease, unspecified: Secondary | ICD-10-CM | POA: Diagnosis not present

## 2023-12-20 DIAGNOSIS — R1312 Dysphagia, oropharyngeal phase: Secondary | ICD-10-CM | POA: Diagnosis not present

## 2023-12-20 DIAGNOSIS — E559 Vitamin D deficiency, unspecified: Secondary | ICD-10-CM | POA: Diagnosis not present

## 2023-12-20 DIAGNOSIS — R269 Unspecified abnormalities of gait and mobility: Secondary | ICD-10-CM | POA: Diagnosis not present

## 2023-12-20 DIAGNOSIS — I69328 Other speech and language deficits following cerebral infarction: Secondary | ICD-10-CM | POA: Diagnosis not present

## 2023-12-20 DIAGNOSIS — R482 Apraxia: Secondary | ICD-10-CM | POA: Diagnosis not present

## 2023-12-20 DIAGNOSIS — I10 Essential (primary) hypertension: Secondary | ICD-10-CM | POA: Diagnosis not present

## 2023-12-20 DIAGNOSIS — R296 Repeated falls: Secondary | ICD-10-CM | POA: Diagnosis not present

## 2023-12-20 DIAGNOSIS — F419 Anxiety disorder, unspecified: Secondary | ICD-10-CM | POA: Diagnosis not present

## 2023-12-20 DIAGNOSIS — R2689 Other abnormalities of gait and mobility: Secondary | ICD-10-CM | POA: Diagnosis not present

## 2023-12-20 DIAGNOSIS — Z8673 Personal history of transient ischemic attack (TIA), and cerebral infarction without residual deficits: Secondary | ICD-10-CM | POA: Diagnosis not present

## 2023-12-20 DIAGNOSIS — F01B3 Vascular dementia, moderate, with mood disturbance: Secondary | ICD-10-CM | POA: Diagnosis not present

## 2023-12-20 DIAGNOSIS — I2510 Atherosclerotic heart disease of native coronary artery without angina pectoris: Secondary | ICD-10-CM | POA: Diagnosis not present

## 2023-12-21 DIAGNOSIS — R296 Repeated falls: Secondary | ICD-10-CM | POA: Diagnosis not present

## 2023-12-21 DIAGNOSIS — E559 Vitamin D deficiency, unspecified: Secondary | ICD-10-CM | POA: Diagnosis not present

## 2023-12-21 DIAGNOSIS — M6281 Muscle weakness (generalized): Secondary | ICD-10-CM | POA: Diagnosis not present

## 2023-12-21 DIAGNOSIS — R1312 Dysphagia, oropharyngeal phase: Secondary | ICD-10-CM | POA: Diagnosis not present

## 2023-12-21 DIAGNOSIS — R2689 Other abnormalities of gait and mobility: Secondary | ICD-10-CM | POA: Diagnosis not present

## 2023-12-21 DIAGNOSIS — R482 Apraxia: Secondary | ICD-10-CM | POA: Diagnosis not present

## 2023-12-21 DIAGNOSIS — I69328 Other speech and language deficits following cerebral infarction: Secondary | ICD-10-CM | POA: Diagnosis not present

## 2023-12-21 DIAGNOSIS — E785 Hyperlipidemia, unspecified: Secondary | ICD-10-CM | POA: Diagnosis not present

## 2023-12-21 DIAGNOSIS — Z79899 Other long term (current) drug therapy: Secondary | ICD-10-CM | POA: Diagnosis not present

## 2023-12-21 DIAGNOSIS — I10 Essential (primary) hypertension: Secondary | ICD-10-CM | POA: Diagnosis not present

## 2023-12-21 DIAGNOSIS — R269 Unspecified abnormalities of gait and mobility: Secondary | ICD-10-CM | POA: Diagnosis not present

## 2023-12-22 DIAGNOSIS — M6281 Muscle weakness (generalized): Secondary | ICD-10-CM | POA: Diagnosis not present

## 2023-12-22 DIAGNOSIS — F01B3 Vascular dementia, moderate, with mood disturbance: Secondary | ICD-10-CM | POA: Diagnosis not present

## 2023-12-22 DIAGNOSIS — I69328 Other speech and language deficits following cerebral infarction: Secondary | ICD-10-CM | POA: Diagnosis not present

## 2023-12-22 DIAGNOSIS — R2689 Other abnormalities of gait and mobility: Secondary | ICD-10-CM | POA: Diagnosis not present

## 2023-12-22 DIAGNOSIS — R269 Unspecified abnormalities of gait and mobility: Secondary | ICD-10-CM | POA: Diagnosis not present

## 2023-12-22 DIAGNOSIS — R1312 Dysphagia, oropharyngeal phase: Secondary | ICD-10-CM | POA: Diagnosis not present

## 2023-12-22 DIAGNOSIS — R482 Apraxia: Secondary | ICD-10-CM | POA: Diagnosis not present

## 2023-12-22 DIAGNOSIS — R296 Repeated falls: Secondary | ICD-10-CM | POA: Diagnosis not present

## 2023-12-25 DIAGNOSIS — R1312 Dysphagia, oropharyngeal phase: Secondary | ICD-10-CM | POA: Diagnosis not present

## 2023-12-25 DIAGNOSIS — R296 Repeated falls: Secondary | ICD-10-CM | POA: Diagnosis not present

## 2023-12-25 DIAGNOSIS — F01B3 Vascular dementia, moderate, with mood disturbance: Secondary | ICD-10-CM | POA: Diagnosis not present

## 2023-12-25 DIAGNOSIS — R269 Unspecified abnormalities of gait and mobility: Secondary | ICD-10-CM | POA: Diagnosis not present

## 2023-12-25 DIAGNOSIS — R2689 Other abnormalities of gait and mobility: Secondary | ICD-10-CM | POA: Diagnosis not present

## 2023-12-25 DIAGNOSIS — R482 Apraxia: Secondary | ICD-10-CM | POA: Diagnosis not present

## 2023-12-25 DIAGNOSIS — I69328 Other speech and language deficits following cerebral infarction: Secondary | ICD-10-CM | POA: Diagnosis not present

## 2023-12-25 DIAGNOSIS — M6281 Muscle weakness (generalized): Secondary | ICD-10-CM | POA: Diagnosis not present

## 2023-12-26 DIAGNOSIS — M6281 Muscle weakness (generalized): Secondary | ICD-10-CM | POA: Diagnosis not present

## 2023-12-26 DIAGNOSIS — I69328 Other speech and language deficits following cerebral infarction: Secondary | ICD-10-CM | POA: Diagnosis not present

## 2023-12-26 DIAGNOSIS — R269 Unspecified abnormalities of gait and mobility: Secondary | ICD-10-CM | POA: Diagnosis not present

## 2023-12-26 DIAGNOSIS — R2689 Other abnormalities of gait and mobility: Secondary | ICD-10-CM | POA: Diagnosis not present

## 2023-12-26 DIAGNOSIS — R296 Repeated falls: Secondary | ICD-10-CM | POA: Diagnosis not present

## 2023-12-26 DIAGNOSIS — R482 Apraxia: Secondary | ICD-10-CM | POA: Diagnosis not present

## 2023-12-26 DIAGNOSIS — R1312 Dysphagia, oropharyngeal phase: Secondary | ICD-10-CM | POA: Diagnosis not present

## 2023-12-27 DIAGNOSIS — R296 Repeated falls: Secondary | ICD-10-CM | POA: Diagnosis not present

## 2023-12-27 DIAGNOSIS — R1312 Dysphagia, oropharyngeal phase: Secondary | ICD-10-CM | POA: Diagnosis not present

## 2023-12-27 DIAGNOSIS — R2689 Other abnormalities of gait and mobility: Secondary | ICD-10-CM | POA: Diagnosis not present

## 2023-12-27 DIAGNOSIS — R269 Unspecified abnormalities of gait and mobility: Secondary | ICD-10-CM | POA: Diagnosis not present

## 2023-12-27 DIAGNOSIS — M6281 Muscle weakness (generalized): Secondary | ICD-10-CM | POA: Diagnosis not present

## 2023-12-27 DIAGNOSIS — R482 Apraxia: Secondary | ICD-10-CM | POA: Diagnosis not present

## 2023-12-27 DIAGNOSIS — I69328 Other speech and language deficits following cerebral infarction: Secondary | ICD-10-CM | POA: Diagnosis not present

## 2023-12-28 DIAGNOSIS — M6281 Muscle weakness (generalized): Secondary | ICD-10-CM | POA: Diagnosis not present

## 2023-12-28 DIAGNOSIS — F01B3 Vascular dementia, moderate, with mood disturbance: Secondary | ICD-10-CM | POA: Diagnosis not present

## 2023-12-28 DIAGNOSIS — R482 Apraxia: Secondary | ICD-10-CM | POA: Diagnosis not present

## 2023-12-28 DIAGNOSIS — R296 Repeated falls: Secondary | ICD-10-CM | POA: Diagnosis not present

## 2023-12-28 DIAGNOSIS — R269 Unspecified abnormalities of gait and mobility: Secondary | ICD-10-CM | POA: Diagnosis not present

## 2023-12-28 DIAGNOSIS — R2689 Other abnormalities of gait and mobility: Secondary | ICD-10-CM | POA: Diagnosis not present

## 2023-12-28 DIAGNOSIS — R1312 Dysphagia, oropharyngeal phase: Secondary | ICD-10-CM | POA: Diagnosis not present

## 2023-12-28 DIAGNOSIS — I69328 Other speech and language deficits following cerebral infarction: Secondary | ICD-10-CM | POA: Diagnosis not present

## 2023-12-29 DIAGNOSIS — R296 Repeated falls: Secondary | ICD-10-CM | POA: Diagnosis not present

## 2023-12-29 DIAGNOSIS — I69328 Other speech and language deficits following cerebral infarction: Secondary | ICD-10-CM | POA: Diagnosis not present

## 2023-12-29 DIAGNOSIS — R2689 Other abnormalities of gait and mobility: Secondary | ICD-10-CM | POA: Diagnosis not present

## 2023-12-29 DIAGNOSIS — R1312 Dysphagia, oropharyngeal phase: Secondary | ICD-10-CM | POA: Diagnosis not present

## 2023-12-29 DIAGNOSIS — M6281 Muscle weakness (generalized): Secondary | ICD-10-CM | POA: Diagnosis not present

## 2023-12-29 DIAGNOSIS — R482 Apraxia: Secondary | ICD-10-CM | POA: Diagnosis not present

## 2023-12-29 DIAGNOSIS — R269 Unspecified abnormalities of gait and mobility: Secondary | ICD-10-CM | POA: Diagnosis not present

## 2023-12-31 DIAGNOSIS — R0602 Shortness of breath: Secondary | ICD-10-CM | POA: Diagnosis not present

## 2023-12-31 DIAGNOSIS — J449 Chronic obstructive pulmonary disease, unspecified: Secondary | ICD-10-CM | POA: Diagnosis not present

## 2024-01-01 DIAGNOSIS — R482 Apraxia: Secondary | ICD-10-CM | POA: Diagnosis not present

## 2024-01-01 DIAGNOSIS — R296 Repeated falls: Secondary | ICD-10-CM | POA: Diagnosis not present

## 2024-01-01 DIAGNOSIS — R2689 Other abnormalities of gait and mobility: Secondary | ICD-10-CM | POA: Diagnosis not present

## 2024-01-01 DIAGNOSIS — R1312 Dysphagia, oropharyngeal phase: Secondary | ICD-10-CM | POA: Diagnosis not present

## 2024-01-01 DIAGNOSIS — I69328 Other speech and language deficits following cerebral infarction: Secondary | ICD-10-CM | POA: Diagnosis not present

## 2024-01-01 DIAGNOSIS — M6281 Muscle weakness (generalized): Secondary | ICD-10-CM | POA: Diagnosis not present

## 2024-01-01 DIAGNOSIS — R269 Unspecified abnormalities of gait and mobility: Secondary | ICD-10-CM | POA: Diagnosis not present

## 2024-01-01 DIAGNOSIS — F01B3 Vascular dementia, moderate, with mood disturbance: Secondary | ICD-10-CM | POA: Diagnosis not present

## 2024-01-02 DIAGNOSIS — R2689 Other abnormalities of gait and mobility: Secondary | ICD-10-CM | POA: Diagnosis not present

## 2024-01-02 DIAGNOSIS — R296 Repeated falls: Secondary | ICD-10-CM | POA: Diagnosis not present

## 2024-01-02 DIAGNOSIS — R269 Unspecified abnormalities of gait and mobility: Secondary | ICD-10-CM | POA: Diagnosis not present

## 2024-01-02 DIAGNOSIS — R482 Apraxia: Secondary | ICD-10-CM | POA: Diagnosis not present

## 2024-01-02 DIAGNOSIS — I69328 Other speech and language deficits following cerebral infarction: Secondary | ICD-10-CM | POA: Diagnosis not present

## 2024-01-02 DIAGNOSIS — F01B3 Vascular dementia, moderate, with mood disturbance: Secondary | ICD-10-CM | POA: Diagnosis not present

## 2024-01-02 DIAGNOSIS — R1312 Dysphagia, oropharyngeal phase: Secondary | ICD-10-CM | POA: Diagnosis not present

## 2024-01-02 DIAGNOSIS — M6281 Muscle weakness (generalized): Secondary | ICD-10-CM | POA: Diagnosis not present

## 2024-01-03 DIAGNOSIS — R296 Repeated falls: Secondary | ICD-10-CM | POA: Diagnosis not present

## 2024-01-03 DIAGNOSIS — R1312 Dysphagia, oropharyngeal phase: Secondary | ICD-10-CM | POA: Diagnosis not present

## 2024-01-03 DIAGNOSIS — F01B3 Vascular dementia, moderate, with mood disturbance: Secondary | ICD-10-CM | POA: Diagnosis not present

## 2024-01-03 DIAGNOSIS — R2689 Other abnormalities of gait and mobility: Secondary | ICD-10-CM | POA: Diagnosis not present

## 2024-01-03 DIAGNOSIS — R269 Unspecified abnormalities of gait and mobility: Secondary | ICD-10-CM | POA: Diagnosis not present

## 2024-01-03 DIAGNOSIS — M6281 Muscle weakness (generalized): Secondary | ICD-10-CM | POA: Diagnosis not present

## 2024-01-03 DIAGNOSIS — R482 Apraxia: Secondary | ICD-10-CM | POA: Diagnosis not present

## 2024-01-03 DIAGNOSIS — I69328 Other speech and language deficits following cerebral infarction: Secondary | ICD-10-CM | POA: Diagnosis not present

## 2024-01-04 DIAGNOSIS — R1312 Dysphagia, oropharyngeal phase: Secondary | ICD-10-CM | POA: Diagnosis not present

## 2024-01-04 DIAGNOSIS — M6281 Muscle weakness (generalized): Secondary | ICD-10-CM | POA: Diagnosis not present

## 2024-01-04 DIAGNOSIS — R482 Apraxia: Secondary | ICD-10-CM | POA: Diagnosis not present

## 2024-01-04 DIAGNOSIS — F01B3 Vascular dementia, moderate, with mood disturbance: Secondary | ICD-10-CM | POA: Diagnosis not present

## 2024-01-04 DIAGNOSIS — R269 Unspecified abnormalities of gait and mobility: Secondary | ICD-10-CM | POA: Diagnosis not present

## 2024-01-04 DIAGNOSIS — R2689 Other abnormalities of gait and mobility: Secondary | ICD-10-CM | POA: Diagnosis not present

## 2024-01-04 DIAGNOSIS — R296 Repeated falls: Secondary | ICD-10-CM | POA: Diagnosis not present

## 2024-01-04 DIAGNOSIS — I69328 Other speech and language deficits following cerebral infarction: Secondary | ICD-10-CM | POA: Diagnosis not present

## 2024-01-05 DIAGNOSIS — M6281 Muscle weakness (generalized): Secondary | ICD-10-CM | POA: Diagnosis not present

## 2024-01-05 DIAGNOSIS — R1312 Dysphagia, oropharyngeal phase: Secondary | ICD-10-CM | POA: Diagnosis not present

## 2024-01-05 DIAGNOSIS — R269 Unspecified abnormalities of gait and mobility: Secondary | ICD-10-CM | POA: Diagnosis not present

## 2024-01-05 DIAGNOSIS — F01A0 Vascular dementia, mild, without behavioral disturbance, psychotic disturbance, mood disturbance, and anxiety: Secondary | ICD-10-CM | POA: Diagnosis not present

## 2024-01-05 DIAGNOSIS — R482 Apraxia: Secondary | ICD-10-CM | POA: Diagnosis not present

## 2024-01-05 DIAGNOSIS — F01B3 Vascular dementia, moderate, with mood disturbance: Secondary | ICD-10-CM | POA: Diagnosis not present

## 2024-01-05 DIAGNOSIS — R296 Repeated falls: Secondary | ICD-10-CM | POA: Diagnosis not present

## 2024-01-05 DIAGNOSIS — I69328 Other speech and language deficits following cerebral infarction: Secondary | ICD-10-CM | POA: Diagnosis not present

## 2024-01-05 DIAGNOSIS — R2689 Other abnormalities of gait and mobility: Secondary | ICD-10-CM | POA: Diagnosis not present

## 2024-01-08 DIAGNOSIS — I69328 Other speech and language deficits following cerebral infarction: Secondary | ICD-10-CM | POA: Diagnosis not present

## 2024-01-08 DIAGNOSIS — R1312 Dysphagia, oropharyngeal phase: Secondary | ICD-10-CM | POA: Diagnosis not present

## 2024-01-08 DIAGNOSIS — M6281 Muscle weakness (generalized): Secondary | ICD-10-CM | POA: Diagnosis not present

## 2024-01-08 DIAGNOSIS — R296 Repeated falls: Secondary | ICD-10-CM | POA: Diagnosis not present

## 2024-01-08 DIAGNOSIS — R2689 Other abnormalities of gait and mobility: Secondary | ICD-10-CM | POA: Diagnosis not present

## 2024-01-08 DIAGNOSIS — R269 Unspecified abnormalities of gait and mobility: Secondary | ICD-10-CM | POA: Diagnosis not present

## 2024-01-08 DIAGNOSIS — F01B3 Vascular dementia, moderate, with mood disturbance: Secondary | ICD-10-CM | POA: Diagnosis not present

## 2024-01-08 DIAGNOSIS — R482 Apraxia: Secondary | ICD-10-CM | POA: Diagnosis not present

## 2024-01-09 DIAGNOSIS — R2689 Other abnormalities of gait and mobility: Secondary | ICD-10-CM | POA: Diagnosis not present

## 2024-01-09 DIAGNOSIS — R296 Repeated falls: Secondary | ICD-10-CM | POA: Diagnosis not present

## 2024-01-09 DIAGNOSIS — R1312 Dysphagia, oropharyngeal phase: Secondary | ICD-10-CM | POA: Diagnosis not present

## 2024-01-09 DIAGNOSIS — I69328 Other speech and language deficits following cerebral infarction: Secondary | ICD-10-CM | POA: Diagnosis not present

## 2024-01-09 DIAGNOSIS — F01B3 Vascular dementia, moderate, with mood disturbance: Secondary | ICD-10-CM | POA: Diagnosis not present

## 2024-01-09 DIAGNOSIS — M6281 Muscle weakness (generalized): Secondary | ICD-10-CM | POA: Diagnosis not present

## 2024-01-09 DIAGNOSIS — R482 Apraxia: Secondary | ICD-10-CM | POA: Diagnosis not present

## 2024-01-09 DIAGNOSIS — R269 Unspecified abnormalities of gait and mobility: Secondary | ICD-10-CM | POA: Diagnosis not present

## 2024-01-10 DIAGNOSIS — R296 Repeated falls: Secondary | ICD-10-CM | POA: Diagnosis not present

## 2024-01-10 DIAGNOSIS — R2689 Other abnormalities of gait and mobility: Secondary | ICD-10-CM | POA: Diagnosis not present

## 2024-01-10 DIAGNOSIS — R269 Unspecified abnormalities of gait and mobility: Secondary | ICD-10-CM | POA: Diagnosis not present

## 2024-01-10 DIAGNOSIS — R1312 Dysphagia, oropharyngeal phase: Secondary | ICD-10-CM | POA: Diagnosis not present

## 2024-01-10 DIAGNOSIS — R482 Apraxia: Secondary | ICD-10-CM | POA: Diagnosis not present

## 2024-01-10 DIAGNOSIS — I69328 Other speech and language deficits following cerebral infarction: Secondary | ICD-10-CM | POA: Diagnosis not present

## 2024-01-10 DIAGNOSIS — M6281 Muscle weakness (generalized): Secondary | ICD-10-CM | POA: Diagnosis not present

## 2024-01-10 DIAGNOSIS — F01B3 Vascular dementia, moderate, with mood disturbance: Secondary | ICD-10-CM | POA: Diagnosis not present

## 2024-01-11 DIAGNOSIS — R269 Unspecified abnormalities of gait and mobility: Secondary | ICD-10-CM | POA: Diagnosis not present

## 2024-01-11 DIAGNOSIS — R296 Repeated falls: Secondary | ICD-10-CM | POA: Diagnosis not present

## 2024-01-11 DIAGNOSIS — R1312 Dysphagia, oropharyngeal phase: Secondary | ICD-10-CM | POA: Diagnosis not present

## 2024-01-11 DIAGNOSIS — I69328 Other speech and language deficits following cerebral infarction: Secondary | ICD-10-CM | POA: Diagnosis not present

## 2024-01-11 DIAGNOSIS — F01B3 Vascular dementia, moderate, with mood disturbance: Secondary | ICD-10-CM | POA: Diagnosis not present

## 2024-01-11 DIAGNOSIS — R2689 Other abnormalities of gait and mobility: Secondary | ICD-10-CM | POA: Diagnosis not present

## 2024-01-11 DIAGNOSIS — M6281 Muscle weakness (generalized): Secondary | ICD-10-CM | POA: Diagnosis not present

## 2024-01-11 DIAGNOSIS — R482 Apraxia: Secondary | ICD-10-CM | POA: Diagnosis not present

## 2024-01-12 DIAGNOSIS — R296 Repeated falls: Secondary | ICD-10-CM | POA: Diagnosis not present

## 2024-01-12 DIAGNOSIS — R482 Apraxia: Secondary | ICD-10-CM | POA: Diagnosis not present

## 2024-01-12 DIAGNOSIS — R269 Unspecified abnormalities of gait and mobility: Secondary | ICD-10-CM | POA: Diagnosis not present

## 2024-01-12 DIAGNOSIS — R2689 Other abnormalities of gait and mobility: Secondary | ICD-10-CM | POA: Diagnosis not present

## 2024-01-12 DIAGNOSIS — R1312 Dysphagia, oropharyngeal phase: Secondary | ICD-10-CM | POA: Diagnosis not present

## 2024-01-12 DIAGNOSIS — I69328 Other speech and language deficits following cerebral infarction: Secondary | ICD-10-CM | POA: Diagnosis not present

## 2024-01-12 DIAGNOSIS — F01B3 Vascular dementia, moderate, with mood disturbance: Secondary | ICD-10-CM | POA: Diagnosis not present

## 2024-01-12 DIAGNOSIS — M6281 Muscle weakness (generalized): Secondary | ICD-10-CM | POA: Diagnosis not present

## 2024-01-15 DIAGNOSIS — R1312 Dysphagia, oropharyngeal phase: Secondary | ICD-10-CM | POA: Diagnosis not present

## 2024-01-15 DIAGNOSIS — R482 Apraxia: Secondary | ICD-10-CM | POA: Diagnosis not present

## 2024-01-15 DIAGNOSIS — R296 Repeated falls: Secondary | ICD-10-CM | POA: Diagnosis not present

## 2024-01-15 DIAGNOSIS — M6281 Muscle weakness (generalized): Secondary | ICD-10-CM | POA: Diagnosis not present

## 2024-01-15 DIAGNOSIS — I69328 Other speech and language deficits following cerebral infarction: Secondary | ICD-10-CM | POA: Diagnosis not present

## 2024-01-15 DIAGNOSIS — R2689 Other abnormalities of gait and mobility: Secondary | ICD-10-CM | POA: Diagnosis not present

## 2024-01-15 DIAGNOSIS — F01B3 Vascular dementia, moderate, with mood disturbance: Secondary | ICD-10-CM | POA: Diagnosis not present

## 2024-01-15 DIAGNOSIS — R269 Unspecified abnormalities of gait and mobility: Secondary | ICD-10-CM | POA: Diagnosis not present

## 2024-01-16 DIAGNOSIS — R2689 Other abnormalities of gait and mobility: Secondary | ICD-10-CM | POA: Diagnosis not present

## 2024-01-16 DIAGNOSIS — R482 Apraxia: Secondary | ICD-10-CM | POA: Diagnosis not present

## 2024-01-16 DIAGNOSIS — R269 Unspecified abnormalities of gait and mobility: Secondary | ICD-10-CM | POA: Diagnosis not present

## 2024-01-16 DIAGNOSIS — R1312 Dysphagia, oropharyngeal phase: Secondary | ICD-10-CM | POA: Diagnosis not present

## 2024-01-16 DIAGNOSIS — I69328 Other speech and language deficits following cerebral infarction: Secondary | ICD-10-CM | POA: Diagnosis not present

## 2024-01-16 DIAGNOSIS — M6281 Muscle weakness (generalized): Secondary | ICD-10-CM | POA: Diagnosis not present

## 2024-01-16 DIAGNOSIS — F01B3 Vascular dementia, moderate, with mood disturbance: Secondary | ICD-10-CM | POA: Diagnosis not present

## 2024-01-16 DIAGNOSIS — R296 Repeated falls: Secondary | ICD-10-CM | POA: Diagnosis not present

## 2024-01-17 DIAGNOSIS — F01B3 Vascular dementia, moderate, with mood disturbance: Secondary | ICD-10-CM | POA: Diagnosis not present

## 2024-01-17 DIAGNOSIS — R482 Apraxia: Secondary | ICD-10-CM | POA: Diagnosis not present

## 2024-01-17 DIAGNOSIS — I69328 Other speech and language deficits following cerebral infarction: Secondary | ICD-10-CM | POA: Diagnosis not present

## 2024-01-17 DIAGNOSIS — R1312 Dysphagia, oropharyngeal phase: Secondary | ICD-10-CM | POA: Diagnosis not present

## 2024-01-17 DIAGNOSIS — R269 Unspecified abnormalities of gait and mobility: Secondary | ICD-10-CM | POA: Diagnosis not present

## 2024-01-17 DIAGNOSIS — M6281 Muscle weakness (generalized): Secondary | ICD-10-CM | POA: Diagnosis not present

## 2024-01-17 DIAGNOSIS — R296 Repeated falls: Secondary | ICD-10-CM | POA: Diagnosis not present

## 2024-01-17 DIAGNOSIS — R2689 Other abnormalities of gait and mobility: Secondary | ICD-10-CM | POA: Diagnosis not present

## 2024-01-18 DIAGNOSIS — M6281 Muscle weakness (generalized): Secondary | ICD-10-CM | POA: Diagnosis not present

## 2024-01-18 DIAGNOSIS — I69328 Other speech and language deficits following cerebral infarction: Secondary | ICD-10-CM | POA: Diagnosis not present

## 2024-01-18 DIAGNOSIS — R1312 Dysphagia, oropharyngeal phase: Secondary | ICD-10-CM | POA: Diagnosis not present

## 2024-01-18 DIAGNOSIS — R2689 Other abnormalities of gait and mobility: Secondary | ICD-10-CM | POA: Diagnosis not present

## 2024-01-18 DIAGNOSIS — R269 Unspecified abnormalities of gait and mobility: Secondary | ICD-10-CM | POA: Diagnosis not present

## 2024-01-18 DIAGNOSIS — R482 Apraxia: Secondary | ICD-10-CM | POA: Diagnosis not present

## 2024-01-18 DIAGNOSIS — R296 Repeated falls: Secondary | ICD-10-CM | POA: Diagnosis not present

## 2024-01-18 DIAGNOSIS — F01B3 Vascular dementia, moderate, with mood disturbance: Secondary | ICD-10-CM | POA: Diagnosis not present

## 2024-01-19 DIAGNOSIS — R1312 Dysphagia, oropharyngeal phase: Secondary | ICD-10-CM | POA: Diagnosis not present

## 2024-01-19 DIAGNOSIS — R2689 Other abnormalities of gait and mobility: Secondary | ICD-10-CM | POA: Diagnosis not present

## 2024-01-19 DIAGNOSIS — I69328 Other speech and language deficits following cerebral infarction: Secondary | ICD-10-CM | POA: Diagnosis not present

## 2024-01-19 DIAGNOSIS — R296 Repeated falls: Secondary | ICD-10-CM | POA: Diagnosis not present

## 2024-01-19 DIAGNOSIS — R269 Unspecified abnormalities of gait and mobility: Secondary | ICD-10-CM | POA: Diagnosis not present

## 2024-01-19 DIAGNOSIS — R482 Apraxia: Secondary | ICD-10-CM | POA: Diagnosis not present

## 2024-01-20 DIAGNOSIS — R482 Apraxia: Secondary | ICD-10-CM | POA: Diagnosis not present

## 2024-01-20 DIAGNOSIS — R296 Repeated falls: Secondary | ICD-10-CM | POA: Diagnosis not present

## 2024-01-20 DIAGNOSIS — R2689 Other abnormalities of gait and mobility: Secondary | ICD-10-CM | POA: Diagnosis not present

## 2024-01-20 DIAGNOSIS — R269 Unspecified abnormalities of gait and mobility: Secondary | ICD-10-CM | POA: Diagnosis not present

## 2024-01-20 DIAGNOSIS — M6281 Muscle weakness (generalized): Secondary | ICD-10-CM | POA: Diagnosis not present

## 2024-01-20 DIAGNOSIS — F01B3 Vascular dementia, moderate, with mood disturbance: Secondary | ICD-10-CM | POA: Diagnosis not present

## 2024-01-20 DIAGNOSIS — R1312 Dysphagia, oropharyngeal phase: Secondary | ICD-10-CM | POA: Diagnosis not present

## 2024-01-20 DIAGNOSIS — I69328 Other speech and language deficits following cerebral infarction: Secondary | ICD-10-CM | POA: Diagnosis not present

## 2024-01-22 DIAGNOSIS — R482 Apraxia: Secondary | ICD-10-CM | POA: Diagnosis not present

## 2024-01-22 DIAGNOSIS — R1312 Dysphagia, oropharyngeal phase: Secondary | ICD-10-CM | POA: Diagnosis not present

## 2024-01-22 DIAGNOSIS — M6281 Muscle weakness (generalized): Secondary | ICD-10-CM | POA: Diagnosis not present

## 2024-01-22 DIAGNOSIS — R269 Unspecified abnormalities of gait and mobility: Secondary | ICD-10-CM | POA: Diagnosis not present

## 2024-01-22 DIAGNOSIS — R296 Repeated falls: Secondary | ICD-10-CM | POA: Diagnosis not present

## 2024-01-22 DIAGNOSIS — F01B3 Vascular dementia, moderate, with mood disturbance: Secondary | ICD-10-CM | POA: Diagnosis not present

## 2024-01-22 DIAGNOSIS — I69328 Other speech and language deficits following cerebral infarction: Secondary | ICD-10-CM | POA: Diagnosis not present

## 2024-01-22 DIAGNOSIS — R2689 Other abnormalities of gait and mobility: Secondary | ICD-10-CM | POA: Diagnosis not present

## 2024-01-23 DIAGNOSIS — R1312 Dysphagia, oropharyngeal phase: Secondary | ICD-10-CM | POA: Diagnosis not present

## 2024-01-23 DIAGNOSIS — R2689 Other abnormalities of gait and mobility: Secondary | ICD-10-CM | POA: Diagnosis not present

## 2024-01-23 DIAGNOSIS — R296 Repeated falls: Secondary | ICD-10-CM | POA: Diagnosis not present

## 2024-01-23 DIAGNOSIS — R269 Unspecified abnormalities of gait and mobility: Secondary | ICD-10-CM | POA: Diagnosis not present

## 2024-01-23 DIAGNOSIS — R482 Apraxia: Secondary | ICD-10-CM | POA: Diagnosis not present

## 2024-01-23 DIAGNOSIS — I69328 Other speech and language deficits following cerebral infarction: Secondary | ICD-10-CM | POA: Diagnosis not present

## 2024-01-23 DIAGNOSIS — M6281 Muscle weakness (generalized): Secondary | ICD-10-CM | POA: Diagnosis not present

## 2024-01-24 DIAGNOSIS — R482 Apraxia: Secondary | ICD-10-CM | POA: Diagnosis not present

## 2024-01-24 DIAGNOSIS — R1312 Dysphagia, oropharyngeal phase: Secondary | ICD-10-CM | POA: Diagnosis not present

## 2024-01-24 DIAGNOSIS — R2689 Other abnormalities of gait and mobility: Secondary | ICD-10-CM | POA: Diagnosis not present

## 2024-01-24 DIAGNOSIS — M6281 Muscle weakness (generalized): Secondary | ICD-10-CM | POA: Diagnosis not present

## 2024-01-24 DIAGNOSIS — R269 Unspecified abnormalities of gait and mobility: Secondary | ICD-10-CM | POA: Diagnosis not present

## 2024-01-24 DIAGNOSIS — R296 Repeated falls: Secondary | ICD-10-CM | POA: Diagnosis not present

## 2024-01-24 DIAGNOSIS — I69328 Other speech and language deficits following cerebral infarction: Secondary | ICD-10-CM | POA: Diagnosis not present

## 2024-01-24 DIAGNOSIS — F01B3 Vascular dementia, moderate, with mood disturbance: Secondary | ICD-10-CM | POA: Diagnosis not present

## 2024-01-25 DIAGNOSIS — R1312 Dysphagia, oropharyngeal phase: Secondary | ICD-10-CM | POA: Diagnosis not present

## 2024-01-25 DIAGNOSIS — F01B3 Vascular dementia, moderate, with mood disturbance: Secondary | ICD-10-CM | POA: Diagnosis not present

## 2024-01-25 DIAGNOSIS — R269 Unspecified abnormalities of gait and mobility: Secondary | ICD-10-CM | POA: Diagnosis not present

## 2024-01-25 DIAGNOSIS — R296 Repeated falls: Secondary | ICD-10-CM | POA: Diagnosis not present

## 2024-01-25 DIAGNOSIS — I69328 Other speech and language deficits following cerebral infarction: Secondary | ICD-10-CM | POA: Diagnosis not present

## 2024-01-25 DIAGNOSIS — M6281 Muscle weakness (generalized): Secondary | ICD-10-CM | POA: Diagnosis not present

## 2024-01-25 DIAGNOSIS — R482 Apraxia: Secondary | ICD-10-CM | POA: Diagnosis not present

## 2024-01-25 DIAGNOSIS — R2689 Other abnormalities of gait and mobility: Secondary | ICD-10-CM | POA: Diagnosis not present

## 2024-01-26 DIAGNOSIS — I69328 Other speech and language deficits following cerebral infarction: Secondary | ICD-10-CM | POA: Diagnosis not present

## 2024-01-26 DIAGNOSIS — R269 Unspecified abnormalities of gait and mobility: Secondary | ICD-10-CM | POA: Diagnosis not present

## 2024-01-26 DIAGNOSIS — R296 Repeated falls: Secondary | ICD-10-CM | POA: Diagnosis not present

## 2024-01-26 DIAGNOSIS — R482 Apraxia: Secondary | ICD-10-CM | POA: Diagnosis not present

## 2024-01-26 DIAGNOSIS — R1312 Dysphagia, oropharyngeal phase: Secondary | ICD-10-CM | POA: Diagnosis not present

## 2024-01-26 DIAGNOSIS — M6281 Muscle weakness (generalized): Secondary | ICD-10-CM | POA: Diagnosis not present

## 2024-01-26 DIAGNOSIS — R2689 Other abnormalities of gait and mobility: Secondary | ICD-10-CM | POA: Diagnosis not present

## 2024-01-26 DIAGNOSIS — F01B3 Vascular dementia, moderate, with mood disturbance: Secondary | ICD-10-CM | POA: Diagnosis not present

## 2024-01-29 DIAGNOSIS — R296 Repeated falls: Secondary | ICD-10-CM | POA: Diagnosis not present

## 2024-01-29 DIAGNOSIS — M6281 Muscle weakness (generalized): Secondary | ICD-10-CM | POA: Diagnosis not present

## 2024-01-29 DIAGNOSIS — R2689 Other abnormalities of gait and mobility: Secondary | ICD-10-CM | POA: Diagnosis not present

## 2024-01-29 DIAGNOSIS — R269 Unspecified abnormalities of gait and mobility: Secondary | ICD-10-CM | POA: Diagnosis not present

## 2024-01-29 DIAGNOSIS — F01B3 Vascular dementia, moderate, with mood disturbance: Secondary | ICD-10-CM | POA: Diagnosis not present

## 2024-01-29 DIAGNOSIS — R482 Apraxia: Secondary | ICD-10-CM | POA: Diagnosis not present

## 2024-01-29 DIAGNOSIS — R1312 Dysphagia, oropharyngeal phase: Secondary | ICD-10-CM | POA: Diagnosis not present

## 2024-01-29 DIAGNOSIS — I69328 Other speech and language deficits following cerebral infarction: Secondary | ICD-10-CM | POA: Diagnosis not present

## 2024-01-30 DIAGNOSIS — I69328 Other speech and language deficits following cerebral infarction: Secondary | ICD-10-CM | POA: Diagnosis not present

## 2024-01-30 DIAGNOSIS — F01B3 Vascular dementia, moderate, with mood disturbance: Secondary | ICD-10-CM | POA: Diagnosis not present

## 2024-01-30 DIAGNOSIS — R269 Unspecified abnormalities of gait and mobility: Secondary | ICD-10-CM | POA: Diagnosis not present

## 2024-01-30 DIAGNOSIS — R482 Apraxia: Secondary | ICD-10-CM | POA: Diagnosis not present

## 2024-01-30 DIAGNOSIS — R1312 Dysphagia, oropharyngeal phase: Secondary | ICD-10-CM | POA: Diagnosis not present

## 2024-01-30 DIAGNOSIS — M6281 Muscle weakness (generalized): Secondary | ICD-10-CM | POA: Diagnosis not present

## 2024-01-30 DIAGNOSIS — R296 Repeated falls: Secondary | ICD-10-CM | POA: Diagnosis not present

## 2024-01-30 DIAGNOSIS — R2689 Other abnormalities of gait and mobility: Secondary | ICD-10-CM | POA: Diagnosis not present

## 2024-01-31 DIAGNOSIS — R2689 Other abnormalities of gait and mobility: Secondary | ICD-10-CM | POA: Diagnosis not present

## 2024-01-31 DIAGNOSIS — F32A Depression, unspecified: Secondary | ICD-10-CM | POA: Diagnosis not present

## 2024-01-31 DIAGNOSIS — I10 Essential (primary) hypertension: Secondary | ICD-10-CM | POA: Diagnosis not present

## 2024-01-31 DIAGNOSIS — J449 Chronic obstructive pulmonary disease, unspecified: Secondary | ICD-10-CM | POA: Diagnosis not present

## 2024-01-31 DIAGNOSIS — R1312 Dysphagia, oropharyngeal phase: Secondary | ICD-10-CM | POA: Diagnosis not present

## 2024-01-31 DIAGNOSIS — Z8673 Personal history of transient ischemic attack (TIA), and cerebral infarction without residual deficits: Secondary | ICD-10-CM | POA: Diagnosis not present

## 2024-01-31 DIAGNOSIS — I69328 Other speech and language deficits following cerebral infarction: Secondary | ICD-10-CM | POA: Diagnosis not present

## 2024-01-31 DIAGNOSIS — R296 Repeated falls: Secondary | ICD-10-CM | POA: Diagnosis not present

## 2024-01-31 DIAGNOSIS — R269 Unspecified abnormalities of gait and mobility: Secondary | ICD-10-CM | POA: Diagnosis not present

## 2024-01-31 DIAGNOSIS — F419 Anxiety disorder, unspecified: Secondary | ICD-10-CM | POA: Diagnosis not present

## 2024-01-31 DIAGNOSIS — R482 Apraxia: Secondary | ICD-10-CM | POA: Diagnosis not present

## 2024-01-31 DIAGNOSIS — F01B3 Vascular dementia, moderate, with mood disturbance: Secondary | ICD-10-CM | POA: Diagnosis not present

## 2024-01-31 DIAGNOSIS — M6281 Muscle weakness (generalized): Secondary | ICD-10-CM | POA: Diagnosis not present

## 2024-01-31 DIAGNOSIS — E785 Hyperlipidemia, unspecified: Secondary | ICD-10-CM | POA: Diagnosis not present

## 2024-02-01 DIAGNOSIS — R2689 Other abnormalities of gait and mobility: Secondary | ICD-10-CM | POA: Diagnosis not present

## 2024-02-01 DIAGNOSIS — M6281 Muscle weakness (generalized): Secondary | ICD-10-CM | POA: Diagnosis not present

## 2024-02-01 DIAGNOSIS — R296 Repeated falls: Secondary | ICD-10-CM | POA: Diagnosis not present

## 2024-02-01 DIAGNOSIS — R482 Apraxia: Secondary | ICD-10-CM | POA: Diagnosis not present

## 2024-02-01 DIAGNOSIS — I69328 Other speech and language deficits following cerebral infarction: Secondary | ICD-10-CM | POA: Diagnosis not present

## 2024-02-01 DIAGNOSIS — R269 Unspecified abnormalities of gait and mobility: Secondary | ICD-10-CM | POA: Diagnosis not present

## 2024-02-01 DIAGNOSIS — R1312 Dysphagia, oropharyngeal phase: Secondary | ICD-10-CM | POA: Diagnosis not present

## 2024-02-02 DIAGNOSIS — I69328 Other speech and language deficits following cerebral infarction: Secondary | ICD-10-CM | POA: Diagnosis not present

## 2024-02-02 DIAGNOSIS — R1312 Dysphagia, oropharyngeal phase: Secondary | ICD-10-CM | POA: Diagnosis not present

## 2024-02-02 DIAGNOSIS — F01B3 Vascular dementia, moderate, with mood disturbance: Secondary | ICD-10-CM | POA: Diagnosis not present

## 2024-02-02 DIAGNOSIS — R2689 Other abnormalities of gait and mobility: Secondary | ICD-10-CM | POA: Diagnosis not present

## 2024-02-02 DIAGNOSIS — R482 Apraxia: Secondary | ICD-10-CM | POA: Diagnosis not present

## 2024-02-02 DIAGNOSIS — R269 Unspecified abnormalities of gait and mobility: Secondary | ICD-10-CM | POA: Diagnosis not present

## 2024-02-02 DIAGNOSIS — R296 Repeated falls: Secondary | ICD-10-CM | POA: Diagnosis not present

## 2024-02-21 DIAGNOSIS — F419 Anxiety disorder, unspecified: Secondary | ICD-10-CM | POA: Diagnosis not present

## 2024-02-21 DIAGNOSIS — F32A Depression, unspecified: Secondary | ICD-10-CM | POA: Diagnosis not present

## 2024-02-21 DIAGNOSIS — I10 Essential (primary) hypertension: Secondary | ICD-10-CM | POA: Diagnosis not present

## 2024-02-21 DIAGNOSIS — Z8673 Personal history of transient ischemic attack (TIA), and cerebral infarction without residual deficits: Secondary | ICD-10-CM | POA: Diagnosis not present

## 2024-02-21 DIAGNOSIS — J449 Chronic obstructive pulmonary disease, unspecified: Secondary | ICD-10-CM | POA: Diagnosis not present

## 2024-02-21 DIAGNOSIS — E785 Hyperlipidemia, unspecified: Secondary | ICD-10-CM | POA: Diagnosis not present

## 2024-02-21 DIAGNOSIS — F01B3 Vascular dementia, moderate, with mood disturbance: Secondary | ICD-10-CM | POA: Diagnosis not present

## 2024-02-21 DIAGNOSIS — I2510 Atherosclerotic heart disease of native coronary artery without angina pectoris: Secondary | ICD-10-CM | POA: Diagnosis not present

## 2024-02-21 DIAGNOSIS — E559 Vitamin D deficiency, unspecified: Secondary | ICD-10-CM | POA: Diagnosis not present

## 2024-02-23 DIAGNOSIS — R451 Restlessness and agitation: Secondary | ICD-10-CM | POA: Diagnosis not present

## 2024-02-23 DIAGNOSIS — F01A0 Vascular dementia, mild, without behavioral disturbance, psychotic disturbance, mood disturbance, and anxiety: Secondary | ICD-10-CM | POA: Diagnosis not present
